# Patient Record
Sex: Female | Born: 1935 | Race: White | Hispanic: No | Smoking: Never smoker
Health system: Southern US, Community
[De-identification: ages and names within clinical notes are randomized; demographics above are authoritative.]

## PROBLEM LIST (undated history)

## (undated) DIAGNOSIS — E119 Type 2 diabetes mellitus without complications: Secondary | ICD-10-CM

## (undated) DIAGNOSIS — M1990 Unspecified osteoarthritis, unspecified site: Secondary | ICD-10-CM

## (undated) DIAGNOSIS — I10 Essential (primary) hypertension: Secondary | ICD-10-CM

## (undated) DIAGNOSIS — R413 Other amnesia: Secondary | ICD-10-CM

## (undated) DIAGNOSIS — E785 Hyperlipidemia, unspecified: Secondary | ICD-10-CM

## (undated) DIAGNOSIS — K219 Gastro-esophageal reflux disease without esophagitis: Secondary | ICD-10-CM

## (undated) HISTORY — DX: Hyperlipidemia, unspecified: E78.5

## (undated) HISTORY — DX: Gastro-esophageal reflux disease without esophagitis: K21.9

## (undated) HISTORY — DX: Other amnesia: R41.3

## (undated) HISTORY — PX: COLONOSCOPY: SHX174

## (undated) HISTORY — DX: Essential (primary) hypertension: I10

## (undated) HISTORY — PX: CHOLECYSTECTOMY: SHX55

## (undated) HISTORY — DX: Unspecified osteoarthritis, unspecified site: M19.90

## (undated) HISTORY — DX: Type 2 diabetes mellitus without complications: E11.9

---

## 1991-05-16 HISTORY — PX: ABDOMINAL HYSTERECTOMY: SHX81

## 1999-01-25 ENCOUNTER — Other Ambulatory Visit: Admission: RE | Admit: 1999-01-25 | Discharge: 1999-01-25 | Payer: Self-pay | Admitting: Internal Medicine

## 2002-05-15 HISTORY — PX: CARDIOVASCULAR STRESS TEST: SHX262

## 2003-02-24 ENCOUNTER — Encounter: Payer: Self-pay | Admitting: Internal Medicine

## 2003-03-05 ENCOUNTER — Encounter: Admission: RE | Admit: 2003-03-05 | Discharge: 2003-03-05 | Payer: Self-pay | Admitting: Internal Medicine

## 2003-03-05 ENCOUNTER — Encounter: Payer: Self-pay | Admitting: Internal Medicine

## 2003-03-06 ENCOUNTER — Encounter: Payer: Self-pay | Admitting: Internal Medicine

## 2005-02-01 ENCOUNTER — Encounter: Payer: Self-pay | Admitting: Family Medicine

## 2005-02-01 ENCOUNTER — Ambulatory Visit (HOSPITAL_COMMUNITY): Admission: RE | Admit: 2005-02-01 | Discharge: 2005-02-01 | Payer: Self-pay | Admitting: Gastroenterology

## 2005-02-01 LAB — HM COLONOSCOPY

## 2005-02-10 ENCOUNTER — Emergency Department (HOSPITAL_COMMUNITY): Admission: EM | Admit: 2005-02-10 | Discharge: 2005-02-10 | Payer: Self-pay | Admitting: *Deleted

## 2005-05-15 LAB — HM DEXA SCAN: HM Dexa Scan: NEGATIVE

## 2006-04-13 ENCOUNTER — Ambulatory Visit: Payer: Self-pay | Admitting: Family Medicine

## 2006-05-18 ENCOUNTER — Ambulatory Visit: Payer: Self-pay | Admitting: Family Medicine

## 2006-06-08 ENCOUNTER — Ambulatory Visit: Payer: Self-pay | Admitting: Family Medicine

## 2006-10-17 ENCOUNTER — Encounter (INDEPENDENT_AMBULATORY_CARE_PROVIDER_SITE_OTHER): Payer: Self-pay | Admitting: *Deleted

## 2007-02-01 ENCOUNTER — Ambulatory Visit: Payer: Self-pay | Admitting: Family Medicine

## 2007-02-01 DIAGNOSIS — L820 Inflamed seborrheic keratosis: Secondary | ICD-10-CM | POA: Insufficient documentation

## 2007-02-01 DIAGNOSIS — L570 Actinic keratosis: Secondary | ICD-10-CM | POA: Insufficient documentation

## 2007-02-01 DIAGNOSIS — L821 Other seborrheic keratosis: Secondary | ICD-10-CM | POA: Insufficient documentation

## 2007-02-04 ENCOUNTER — Encounter: Payer: Self-pay | Admitting: Family Medicine

## 2007-02-12 ENCOUNTER — Encounter: Payer: Self-pay | Admitting: Family Medicine

## 2007-04-01 ENCOUNTER — Telehealth: Payer: Self-pay | Admitting: Family Medicine

## 2007-04-19 ENCOUNTER — Ambulatory Visit: Payer: Self-pay | Admitting: Family Medicine

## 2007-04-19 DIAGNOSIS — E1159 Type 2 diabetes mellitus with other circulatory complications: Secondary | ICD-10-CM | POA: Insufficient documentation

## 2007-04-19 DIAGNOSIS — E7800 Pure hypercholesterolemia, unspecified: Secondary | ICD-10-CM | POA: Insufficient documentation

## 2007-04-19 DIAGNOSIS — E1169 Type 2 diabetes mellitus with other specified complication: Secondary | ICD-10-CM | POA: Insufficient documentation

## 2007-04-19 DIAGNOSIS — I10 Essential (primary) hypertension: Secondary | ICD-10-CM | POA: Insufficient documentation

## 2007-04-19 DIAGNOSIS — D485 Neoplasm of uncertain behavior of skin: Secondary | ICD-10-CM | POA: Insufficient documentation

## 2007-04-26 ENCOUNTER — Ambulatory Visit: Payer: Self-pay | Admitting: Family Medicine

## 2007-04-29 ENCOUNTER — Encounter: Payer: Self-pay | Admitting: Family Medicine

## 2007-04-30 ENCOUNTER — Ambulatory Visit: Payer: Self-pay | Admitting: Family Medicine

## 2007-05-02 ENCOUNTER — Encounter: Payer: Self-pay | Admitting: Family Medicine

## 2007-05-03 ENCOUNTER — Ambulatory Visit: Payer: Self-pay | Admitting: Family Medicine

## 2007-05-06 ENCOUNTER — Ambulatory Visit: Payer: Self-pay | Admitting: Family Medicine

## 2007-05-14 ENCOUNTER — Encounter (INDEPENDENT_AMBULATORY_CARE_PROVIDER_SITE_OTHER): Payer: Self-pay | Admitting: *Deleted

## 2007-05-22 ENCOUNTER — Ambulatory Visit: Payer: Self-pay | Admitting: Family Medicine

## 2007-05-29 LAB — CONVERTED CEMR LAB
ALT: 35 units/L (ref 0–35)
AST: 30 units/L (ref 0–37)
Albumin: 4.1 g/dL (ref 3.5–5.2)
Alkaline Phosphatase: 68 units/L (ref 39–117)
BUN: 20 mg/dL (ref 6–23)
Bilirubin, Direct: 0.1 mg/dL (ref 0.0–0.3)
CO2: 35 meq/L — ABNORMAL HIGH (ref 19–32)
Calcium: 9.8 mg/dL (ref 8.4–10.5)
Chloride: 97 meq/L (ref 96–112)
Cholesterol: 228 mg/dL (ref 0–200)
Creatinine, Ser: 0.8 mg/dL (ref 0.4–1.2)
Direct LDL: 137.5 mg/dL
GFR calc Af Amer: 91 mL/min
GFR calc non Af Amer: 76 mL/min
Glucose, Bld: 107 mg/dL — ABNORMAL HIGH (ref 70–99)
HDL: 76.2 mg/dL (ref >39.0)
Potassium: 3.7 meq/L (ref 3.5–5.1)
Sodium: 140 meq/L (ref 135–145)
Total Bilirubin: 0.9 mg/dL (ref 0.3–1.2)
Total CHOL/HDL Ratio: 3
Total Protein: 7.2 g/dL (ref 6.0–8.3)
Triglycerides: 61 mg/dL (ref 0–149)
VLDL: 12 mg/dL (ref 0–40)

## 2007-05-30 ENCOUNTER — Ambulatory Visit: Payer: Self-pay | Admitting: Family Medicine

## 2007-05-31 ENCOUNTER — Encounter: Payer: Self-pay | Admitting: Family Medicine

## 2007-05-31 DIAGNOSIS — R32 Unspecified urinary incontinence: Secondary | ICD-10-CM | POA: Insufficient documentation

## 2007-05-31 DIAGNOSIS — M1990 Unspecified osteoarthritis, unspecified site: Secondary | ICD-10-CM | POA: Insufficient documentation

## 2007-05-31 DIAGNOSIS — J3089 Other allergic rhinitis: Secondary | ICD-10-CM | POA: Insufficient documentation

## 2007-05-31 DIAGNOSIS — E669 Obesity, unspecified: Secondary | ICD-10-CM | POA: Insufficient documentation

## 2007-05-31 DIAGNOSIS — J309 Allergic rhinitis, unspecified: Secondary | ICD-10-CM

## 2007-05-31 DIAGNOSIS — K219 Gastro-esophageal reflux disease without esophagitis: Secondary | ICD-10-CM | POA: Insufficient documentation

## 2007-09-11 ENCOUNTER — Ambulatory Visit: Payer: Self-pay | Admitting: Family Medicine

## 2007-09-11 DIAGNOSIS — K5904 Chronic idiopathic constipation: Secondary | ICD-10-CM | POA: Insufficient documentation

## 2007-09-13 LAB — CONVERTED CEMR LAB
ALT: 23 units/L (ref 0–35)
AST: 26 units/L (ref 0–37)
Albumin: 4.3 g/dL (ref 3.5–5.2)
Alkaline Phosphatase: 56 units/L (ref 39–117)
BUN: 16 mg/dL (ref 6–23)
Basophils Absolute: 0 10*3/uL (ref 0.0–0.1)
Basophils Relative: 0.1 % (ref 0.0–1.0)
Bilirubin, Direct: 0.1 mg/dL (ref 0.0–0.3)
CO2: 36 meq/L — ABNORMAL HIGH (ref 19–32)
Calcium: 10.2 mg/dL (ref 8.4–10.5)
Chloride: 97 meq/L (ref 96–112)
Creatinine, Ser: 0.9 mg/dL (ref 0.4–1.2)
Eosinophils Absolute: 0.1 10*3/uL (ref 0.0–0.7)
Eosinophils Relative: 2.6 % (ref 0.0–5.0)
GFR calc Af Amer: 80 mL/min
GFR calc non Af Amer: 66 mL/min
Glucose, Bld: 98 mg/dL (ref 70–99)
HCT: 42.5 % (ref 36.0–46.0)
Hemoglobin: 14.2 g/dL (ref 12.0–15.0)
Lipase: 23 units/L (ref 11.0–59.0)
Lymphocytes Relative: 35.7 % (ref 12.0–46.0)
MCHC: 33.4 g/dL (ref 30.0–36.0)
MCV: 95.3 fL (ref 78.0–100.0)
Monocytes Absolute: 0.6 10*3/uL (ref 0.1–1.0)
Monocytes Relative: 10.3 % (ref 3.0–12.0)
Neutro Abs: 2.9 10*3/uL (ref 1.4–7.7)
Neutrophils Relative %: 51.3 % (ref 43.0–77.0)
Platelets: 267 10*3/uL (ref 150–400)
Potassium: 3.7 meq/L (ref 3.5–5.1)
RBC: 4.46 M/uL (ref 3.87–5.11)
RDW: 12.6 % (ref 11.5–14.6)
Sodium: 140 meq/L (ref 135–145)
TSH: 2.72 microintl units/mL (ref 0.35–5.50)
Total Bilirubin: 0.8 mg/dL (ref 0.3–1.2)
Total Protein: 7.2 g/dL (ref 6.0–8.3)
WBC: 5.6 10*3/uL (ref 4.5–10.5)

## 2007-09-30 ENCOUNTER — Ambulatory Visit: Payer: Self-pay | Admitting: Family Medicine

## 2007-09-30 LAB — CONVERTED CEMR LAB
Bilirubin Urine: NEGATIVE
Glucose, Urine, Semiquant: NEGATIVE
Ketones, urine, test strip: NEGATIVE
Nitrite: POSITIVE
Protein, U semiquant: NEGATIVE
Specific Gravity, Urine: <1.005
Urobilinogen, UA: NEGATIVE
pH: 7.5

## 2007-10-01 ENCOUNTER — Encounter (INDEPENDENT_AMBULATORY_CARE_PROVIDER_SITE_OTHER): Payer: Self-pay | Admitting: Internal Medicine

## 2007-11-06 ENCOUNTER — Encounter: Payer: Self-pay | Admitting: Family Medicine

## 2007-11-12 ENCOUNTER — Encounter (INDEPENDENT_AMBULATORY_CARE_PROVIDER_SITE_OTHER): Payer: Self-pay | Admitting: *Deleted

## 2008-03-12 ENCOUNTER — Ambulatory Visit: Payer: Self-pay | Admitting: Family Medicine

## 2008-06-23 ENCOUNTER — Telehealth: Payer: Self-pay | Admitting: Family Medicine

## 2008-07-29 ENCOUNTER — Ambulatory Visit: Payer: Self-pay | Admitting: Family Medicine

## 2008-08-02 LAB — CONVERTED CEMR LAB
ALT: 18 units/L (ref 0–35)
AST: 18 units/L (ref 0–37)
Albumin: 4.1 g/dL (ref 3.5–5.2)
Alkaline Phosphatase: 61 units/L (ref 39–117)
BUN: 14 mg/dL (ref 6–23)
Bilirubin, Direct: 0 mg/dL (ref 0.0–0.3)
CO2: 36 meq/L — ABNORMAL HIGH (ref 19–32)
Calcium: 9.5 mg/dL (ref 8.4–10.5)
Chloride: 98 meq/L (ref 96–112)
Cholesterol: 171 mg/dL (ref 0–200)
Creatinine, Ser: 0.7 mg/dL (ref 0.4–1.2)
GFR calc non Af Amer: 87.77 mL/min (ref >60)
Glucose, Bld: 117 mg/dL — ABNORMAL HIGH (ref 70–99)
HDL: 53.6 mg/dL (ref >39.00)
LDL Cholesterol: 100 mg/dL — ABNORMAL HIGH (ref 0–99)
Potassium: 3.1 meq/L — ABNORMAL LOW (ref 3.5–5.1)
Sodium: 141 meq/L (ref 135–145)
Total Bilirubin: 0.9 mg/dL (ref 0.3–1.2)
Total CHOL/HDL Ratio: 3
Total Protein: 7.2 g/dL (ref 6.0–8.3)
Triglycerides: 85 mg/dL (ref 0.0–149.0)
VLDL: 17 mg/dL (ref 0.0–40.0)

## 2008-08-04 ENCOUNTER — Ambulatory Visit: Payer: Self-pay | Admitting: Family Medicine

## 2008-11-04 ENCOUNTER — Ambulatory Visit: Payer: Self-pay | Admitting: Family Medicine

## 2008-11-06 ENCOUNTER — Encounter: Payer: Self-pay | Admitting: Family Medicine

## 2008-11-09 DIAGNOSIS — R928 Other abnormal and inconclusive findings on diagnostic imaging of breast: Secondary | ICD-10-CM | POA: Insufficient documentation

## 2008-11-10 ENCOUNTER — Encounter: Payer: Self-pay | Admitting: Family Medicine

## 2008-11-11 DIAGNOSIS — E1159 Type 2 diabetes mellitus with other circulatory complications: Secondary | ICD-10-CM | POA: Insufficient documentation

## 2008-11-11 DIAGNOSIS — E119 Type 2 diabetes mellitus without complications: Secondary | ICD-10-CM | POA: Insufficient documentation

## 2008-11-11 LAB — CONVERTED CEMR LAB
BUN: 19 mg/dL (ref 6–23)
CO2: 33 meq/L — ABNORMAL HIGH (ref 19–32)
Calcium: 9.9 mg/dL (ref 8.4–10.5)
Chloride: 99 meq/L (ref 96–112)
Creatinine, Ser: 0.9 mg/dL (ref 0.4–1.2)
GFR calc non Af Amer: 65.63 mL/min (ref >60)
Glucose, Bld: 113 mg/dL — ABNORMAL HIGH (ref 70–99)
Hgb A1c MFr Bld: 6.3 % (ref 4.6–6.5)
Potassium: 3.4 meq/L — ABNORMAL LOW (ref 3.5–5.1)
Sodium: 143 meq/L (ref 135–145)
TSH: 5.25 microintl units/mL (ref 0.35–5.50)

## 2008-11-26 ENCOUNTER — Ambulatory Visit: Payer: Self-pay | Admitting: Family Medicine

## 2008-12-08 ENCOUNTER — Encounter: Payer: Self-pay | Admitting: Family Medicine

## 2009-01-25 ENCOUNTER — Ambulatory Visit: Payer: Self-pay | Admitting: Family Medicine

## 2009-01-26 LAB — CONVERTED CEMR LAB
ALT: 19 units/L (ref 0–35)
AST: 26 units/L (ref 0–37)
Albumin: 4.4 g/dL (ref 3.5–5.2)
Alkaline Phosphatase: 55 units/L (ref 39–117)
BUN: 14 mg/dL (ref 6–23)
Bilirubin, Direct: 0.1 mg/dL (ref 0.0–0.3)
CO2: 35 meq/L — ABNORMAL HIGH (ref 19–32)
Calcium: 9.7 mg/dL (ref 8.4–10.5)
Chloride: 101 meq/L (ref 96–112)
Cholesterol: 185 mg/dL (ref 0–200)
Creatinine, Ser: 0.8 mg/dL (ref 0.4–1.2)
Creatinine,U: 162.7 mg/dL
GFR calc non Af Amer: 75.13 mL/min (ref >60)
Glucose, Bld: 108 mg/dL — ABNORMAL HIGH (ref 70–99)
HDL: 56.2 mg/dL (ref >39.00)
Hgb A1c MFr Bld: 6.3 % (ref 4.6–6.5)
LDL Cholesterol: 103 mg/dL — ABNORMAL HIGH (ref 0–99)
Microalb Creat Ratio: 8 mg/g (ref 0.0–30.0)
Microalb, Ur: 1.3 mg/dL (ref 0.0–1.9)
Potassium: 3.5 meq/L (ref 3.5–5.1)
Sodium: 143 meq/L (ref 135–145)
Total Bilirubin: 0.8 mg/dL (ref 0.3–1.2)
Total CHOL/HDL Ratio: 3
Total Protein: 7 g/dL (ref 6.0–8.3)
Triglycerides: 130 mg/dL (ref 0.0–149.0)
VLDL: 26 mg/dL (ref 0.0–40.0)

## 2009-01-28 ENCOUNTER — Ambulatory Visit: Payer: Self-pay | Admitting: Family Medicine

## 2009-01-28 LAB — CONVERTED CEMR LAB
Cholesterol, target level: 200 mg/dL
HDL goal, serum: 40 mg/dL
LDL Goal: 100 mg/dL

## 2009-02-03 ENCOUNTER — Telehealth: Payer: Self-pay | Admitting: Family Medicine

## 2009-02-05 ENCOUNTER — Encounter: Payer: Self-pay | Admitting: Family Medicine

## 2009-02-09 ENCOUNTER — Ambulatory Visit: Payer: Self-pay | Admitting: Family Medicine

## 2009-04-26 ENCOUNTER — Ambulatory Visit: Payer: Self-pay | Admitting: Family Medicine

## 2009-04-29 LAB — CONVERTED CEMR LAB
ALT: 21 units/L (ref 0–35)
AST: 22 units/L (ref 0–37)
Albumin: 4.4 g/dL (ref 3.5–5.2)
Alkaline Phosphatase: 60 units/L (ref 39–117)
BUN: 7 mg/dL (ref 6–23)
Bilirubin, Direct: 0.1 mg/dL (ref 0.0–0.3)
CO2: 35 meq/L — ABNORMAL HIGH (ref 19–32)
Calcium: 10.1 mg/dL (ref 8.4–10.5)
Chloride: 98 meq/L (ref 96–112)
Cholesterol: 210 mg/dL — ABNORMAL HIGH (ref 0–200)
Creatinine, Ser: 0.7 mg/dL (ref 0.4–1.2)
Direct LDL: 119.8 mg/dL
GFR calc non Af Amer: 87.59 mL/min (ref >60)
Glucose, Bld: 111 mg/dL — ABNORMAL HIGH (ref 70–99)
HDL: 69.6 mg/dL (ref >39.00)
Hgb A1c MFr Bld: 6.2 % (ref 4.6–6.5)
Potassium: 3.6 meq/L (ref 3.5–5.1)
Sodium: 142 meq/L (ref 135–145)
Total Bilirubin: 1.1 mg/dL (ref 0.3–1.2)
Total CHOL/HDL Ratio: 3
Total Protein: 7.3 g/dL (ref 6.0–8.3)
Triglycerides: 87 mg/dL (ref 0.0–149.0)
VLDL: 17.4 mg/dL (ref 0.0–40.0)

## 2009-05-10 ENCOUNTER — Encounter: Payer: Self-pay | Admitting: Family Medicine

## 2009-05-11 ENCOUNTER — Ambulatory Visit: Payer: Self-pay | Admitting: Family Medicine

## 2009-05-15 LAB — HM DIABETES EYE EXAM

## 2009-05-20 ENCOUNTER — Encounter: Payer: Self-pay | Admitting: Family Medicine

## 2009-05-24 ENCOUNTER — Encounter: Payer: Self-pay | Admitting: Family Medicine

## 2009-05-27 ENCOUNTER — Telehealth: Payer: Self-pay | Admitting: Family Medicine

## 2009-05-27 ENCOUNTER — Encounter: Payer: Self-pay | Admitting: Internal Medicine

## 2009-05-27 ENCOUNTER — Ambulatory Visit: Payer: Self-pay | Admitting: Family Medicine

## 2009-05-28 ENCOUNTER — Ambulatory Visit: Payer: Self-pay | Admitting: Internal Medicine

## 2009-05-28 ENCOUNTER — Encounter: Payer: Self-pay | Admitting: Internal Medicine

## 2009-06-04 LAB — CONVERTED CEMR LAB
BUN: 16 mg/dL (ref 6–23)
CO2: 30 meq/L (ref 19–32)
Calcium: 9.9 mg/dL (ref 8.4–10.5)
Chloride: 98 meq/L (ref 96–112)
Creatinine, Ser: 0.65 mg/dL (ref 0.40–1.20)
Glucose, Bld: 101 mg/dL — ABNORMAL HIGH (ref 70–99)
Potassium: 3.5 meq/L (ref 3.5–5.3)
Sodium: 140 meq/L (ref 135–145)
TSH: 2.56 microintl units/mL (ref 0.350–4.500)

## 2009-06-21 ENCOUNTER — Encounter: Payer: Self-pay | Admitting: Cardiovascular Disease

## 2009-06-28 ENCOUNTER — Telehealth: Payer: Self-pay | Admitting: Cardiovascular Disease

## 2009-06-29 ENCOUNTER — Ambulatory Visit: Payer: Self-pay | Admitting: Cardiovascular Disease

## 2009-07-09 ENCOUNTER — Ambulatory Visit: Payer: Self-pay | Admitting: Cardiovascular Disease

## 2009-07-13 ENCOUNTER — Ambulatory Visit: Payer: Self-pay | Admitting: Cardiovascular Disease

## 2009-07-26 ENCOUNTER — Encounter: Payer: Self-pay | Admitting: Cardiovascular Disease

## 2009-08-13 ENCOUNTER — Telehealth: Payer: Self-pay | Admitting: Cardiovascular Disease

## 2009-08-27 ENCOUNTER — Ambulatory Visit: Payer: Self-pay | Admitting: Family Medicine

## 2009-09-02 LAB — CONVERTED CEMR LAB
ALT: 21 units/L (ref 0–35)
AST: 20 units/L (ref 0–37)
Cholesterol: 167 mg/dL (ref 0–200)
HDL: 65.8 mg/dL (ref >39.00)
LDL Cholesterol: 89 mg/dL (ref 0–99)
Total CHOL/HDL Ratio: 3
Triglycerides: 61 mg/dL (ref 0.0–149.0)
VLDL: 12.2 mg/dL (ref 0.0–40.0)

## 2009-10-12 ENCOUNTER — Encounter: Payer: Self-pay | Admitting: Family Medicine

## 2009-10-26 ENCOUNTER — Encounter: Payer: Self-pay | Admitting: Family Medicine

## 2009-11-11 ENCOUNTER — Ambulatory Visit: Payer: Self-pay | Admitting: Family Medicine

## 2009-11-12 ENCOUNTER — Ambulatory Visit: Payer: Self-pay | Admitting: Family Medicine

## 2009-11-12 LAB — CONVERTED CEMR LAB
ALT: 19 units/L (ref 0–35)
AST: 21 units/L (ref 0–37)
Albumin: 4.5 g/dL (ref 3.5–5.2)
Alkaline Phosphatase: 73 units/L (ref 39–117)
BUN: 19 mg/dL (ref 6–23)
Bilirubin, Direct: 0.1 mg/dL (ref 0.0–0.3)
CO2: 33 meq/L — ABNORMAL HIGH (ref 19–32)
Calcium: 9.6 mg/dL (ref 8.4–10.5)
Chloride: 99 meq/L (ref 96–112)
Cholesterol: 173 mg/dL (ref 0–200)
Creatinine, Ser: 0.8 mg/dL (ref 0.4–1.2)
GFR calc non Af Amer: 80.76 mL/min (ref >60)
Glucose, Bld: 98 mg/dL (ref 70–99)
HDL: 65.5 mg/dL (ref >39.00)
Hgb A1c MFr Bld: 6.4 % (ref 4.6–6.5)
LDL Cholesterol: 93 mg/dL (ref 0–99)
Potassium: 4 meq/L (ref 3.5–5.1)
Sodium: 141 meq/L (ref 135–145)
Total Bilirubin: 0.5 mg/dL (ref 0.3–1.2)
Total CHOL/HDL Ratio: 3
Total Protein: 7 g/dL (ref 6.0–8.3)
Triglycerides: 74 mg/dL (ref 0.0–149.0)
VLDL: 14.8 mg/dL (ref 0.0–40.0)

## 2009-11-17 ENCOUNTER — Encounter: Payer: Self-pay | Admitting: Family Medicine

## 2010-01-13 ENCOUNTER — Encounter: Payer: Self-pay | Admitting: Family Medicine

## 2010-01-14 ENCOUNTER — Ambulatory Visit: Payer: Self-pay | Admitting: Family Medicine

## 2010-01-14 DIAGNOSIS — G4700 Insomnia, unspecified: Secondary | ICD-10-CM | POA: Insufficient documentation

## 2010-02-26 ENCOUNTER — Observation Stay (HOSPITAL_COMMUNITY): Admission: EM | Admit: 2010-02-26 | Discharge: 2010-02-27 | Payer: Self-pay | Admitting: Emergency Medicine

## 2010-03-01 ENCOUNTER — Telehealth: Payer: Self-pay | Admitting: Cardiovascular Disease

## 2010-03-10 ENCOUNTER — Ambulatory Visit: Payer: Self-pay | Admitting: Family Medicine

## 2010-03-10 LAB — CONVERTED CEMR LAB: Whiff Test: NEGATIVE

## 2010-03-11 ENCOUNTER — Telehealth: Payer: Self-pay | Admitting: Family Medicine

## 2010-05-18 ENCOUNTER — Ambulatory Visit
Admission: RE | Admit: 2010-05-18 | Discharge: 2010-05-18 | Payer: Self-pay | Source: Home / Self Care | Attending: Family Medicine | Admitting: Family Medicine

## 2010-05-18 ENCOUNTER — Other Ambulatory Visit: Payer: Self-pay | Admitting: Family Medicine

## 2010-05-18 ENCOUNTER — Encounter: Payer: Self-pay | Admitting: Family Medicine

## 2010-05-18 LAB — HEPATIC FUNCTION PANEL
ALT: 26 U/L (ref 0–35)
AST: 25 U/L (ref 0–37)
Albumin: 4.2 g/dL (ref 3.5–5.2)
Alkaline Phosphatase: 77 U/L (ref 39–117)
Bilirubin, Direct: 0.1 mg/dL (ref 0.0–0.3)
Total Bilirubin: 0.9 mg/dL (ref 0.3–1.2)
Total Protein: 7 g/dL (ref 6.0–8.3)

## 2010-05-18 LAB — BASIC METABOLIC PANEL
BUN: 17 mg/dL (ref 6–23)
CO2: 31 mEq/L (ref 19–32)
Calcium: 9.7 mg/dL (ref 8.4–10.5)
Chloride: 102 mEq/L (ref 96–112)
Creatinine, Ser: 0.6 mg/dL (ref 0.4–1.2)
GFR: 96.84 mL/min (ref >60.00)
Glucose, Bld: 92 mg/dL (ref 70–99)
Potassium: 4.5 mEq/L (ref 3.5–5.1)
Sodium: 141 mEq/L (ref 135–145)

## 2010-05-18 LAB — LIPID PANEL
Cholesterol: 197 mg/dL (ref 0–200)
HDL: 69.7 mg/dL (ref >39.00)
LDL Cholesterol: 117 mg/dL — ABNORMAL HIGH (ref 0–99)
Total CHOL/HDL Ratio: 3
Triglycerides: 53 mg/dL (ref 0.0–149.0)
VLDL: 10.6 mg/dL (ref 0.0–40.0)

## 2010-05-18 LAB — HM MAMMOGRAPHY

## 2010-05-18 LAB — HEMOGLOBIN A1C: Hgb A1c MFr Bld: 6.4 % (ref 4.6–6.5)

## 2010-05-18 LAB — MICROALBUMIN / CREATININE URINE RATIO
Creatinine,U: 117.6 mg/dL
Microalb Creat Ratio: 0.5 mg/g (ref 0.0–30.0)
Microalb, Ur: 0.6 mg/dL (ref 0.0–1.9)

## 2010-05-20 ENCOUNTER — Encounter: Payer: Self-pay | Admitting: Family Medicine

## 2010-05-20 ENCOUNTER — Ambulatory Visit
Admission: RE | Admit: 2010-05-20 | Discharge: 2010-05-20 | Payer: Self-pay | Source: Home / Self Care | Attending: Family Medicine | Admitting: Family Medicine

## 2010-05-20 LAB — HM DIABETES FOOT EXAM

## 2010-06-09 ENCOUNTER — Ambulatory Visit
Admission: RE | Admit: 2010-06-09 | Discharge: 2010-06-09 | Payer: Self-pay | Source: Home / Self Care | Attending: Family Medicine | Admitting: Family Medicine

## 2010-06-14 NOTE — Assessment & Plan Note (Signed)
Summary: NEW PT; PALPITATIONS   Visit Type:  New Patient Referring Provider:  Kerin Perna, MD Primary Provider:  Kerby Nora MD  CC:  heart racing and feel like it is beating really hard, don't happen all the time, and it will sometimes last 30 sec to a minute. With in the last week has occured more often. No more than usually sob. No edema. .  History of Present Illness: Patient is a 75 year old with a history of hypertension, dyslipidemia who was referred for evaluation of palpitations. the patient has had palpitations in the past.  She said that recently they have been coming on more frequently.  She will feel her heart start pounding suddenly.  Initially it happened when she was in bed.  Now it occcurs at other times.  Yesterday seemed to happen throughout the day.  Episodes initially lasted 30 sec to a min.  Now are longer.  Denies dizziness, shortness of breath, chest pains.  Is not that active but can do her housework at a usual pace. This Am just felt jittery.  Does not drink much caffeine.  BP checks at home 120-140/60-70 and Pulse by machine is 60 to 70.  Cannot take on her own.  Preventive Screening-Counseling & Management  Alcohol-Tobacco     Alcohol drinks/day: <1     Smoking Status: never  Caffeine-Diet-Exercise     Caffeine use/day: 1-2  cup a day     Does Patient Exercise: no   Current Problems (verified): 1)  Palpitations  (ICD-785.1) 2)  Hypokalemia  (ICD-276.8) 3)  Mammogram, Abnormal, Left  (ICD-793.80) 4)  Other Malaise and Fatigue  (ICD-780.79) 5)  Uti  (ICD-599.0) 6)  Constipation  (ICD-564.00) 7)  Nausea  (ICD-787.02) 8)  Obesity  (ICD-278.00) 9)  Osteoarthritis  (ICD-715.90) 10)  Gerd  (ICD-530.81) 11)  Allergic Rhinitis  (ICD-477.9) 12)  Diabetes Mellitus, Type II  (ICD-250.00) 13)  Cellulitis, Leg, Right  (ICD-682.6) 14)  Special Screening For Osteoporosis  (ICD-V82.81) 15)  Neoplasm, Skin, Uncertain Behavior  (ICD-238.2) 16)  Urinary  Incontinence  (ICD-788.30) 17)  Hypertension  (ICD-401.9) 18)  Hyperlipidemia  (ICD-272.4) 19)  Verruca Vulgaris  (ICD-078.10) 20)  Actinic Keratosis  (ICD-702.0) 21)  Seborrheic Keratosis  (ICD-702.19)  Current Medications (verified): 1)  Amlodipine Besylate 5 Mg Tabs (Amlodipine Besylate) .... Take 1 Tablet By Mouth Once A Day 2)  Simvastatin 40 Mg Tabs (Simvastatin) .... Take 1 Tablet By Mouth Once A Day -On Hold Due To Heart Racing 3)  Omeprazole 20 Mg Tbec (Omeprazole) .... Take 1 Tablet By Mouth Once A Day 4)  Atenolol-Chlorthalidone 50-25 Mg Tabs (Atenolol-Chlorthalidone) .Marland Kitchen.. 1 Tablet By Mouth Once A Day 5)  Otc Potassium .Marland Kitchen.. 1 By Mouth Once Daily 6)  Adult Aspirin Ec Low Strength 81 Mg  Tbec (Aspirin) .... Take 1 Tablet By Mouth Once A Day 7)  Fish Oil Concentrate 1000 Mg  Caps (Omega-3 Fatty Acids) .... Take 1 Tablet By Mouth Once A Day 8)  Bl Multiple Vitamins   Tabs (Multiple Vitamins-Minerals) .... Take 1 Tablet By Mouth Once A Day 9)  Calcium 600/vitamin D 600-200 Mg-Unit  Tabs (Calcium Carbonate-Vitamin D) .... Take 1 Tablet By Mouth Two Times A Day 10)  Bl Magnesium 250 Mg  Tabs (Magnesium) .... Take 1 Tablet By Mouth Once A Day 11)  Glucosamine-Chondroitin 250-200 Mg  Caps (Glucosamine-Chondroitin) .... Take 1 Tablet By Mouth Once A Day 12)  Co Q-10 120 Mg  Caps (Coenzyme Q10) .... Take  1 Capsule By Mouth Once A Day 13)  Glucometer of Choice .... Check Blood Sugar Daily  Dx 250.00 14)  True Track Glucometer Test Strips .... Check Blood Sugar Once A Day  Allergies (verified): 1)  ! Codeine  Past History:  Past Medical History: Last updated: June 17, 2007 Hyperlipidemia Hypertension Urinary incontinence Allergic rhinitis GERD Osteoarthritis  Family History: Last updated: 06/17/07 Father: Died 88 MI Mother: Died 55, dementia, CVA Siblings: 1 sister, ovarian CA, age 17 DM:  Maternal side Ovarian CA:  Sister, grandmother, great aunt  Social History: Last  updated: June 17, 2007 Never Smoked Alcohol use-no Drug use-no Regular exercise-yes, Curves 2x/week Marital Status: Married x 48 years Children: None Occupation: Scientist, product/process development, works part-time as Asst. to [employer] Diet:  Fruit and veggies, no FF, some water, artificial sweetener  Past Surgical History: Echo Stress Test ([facility]:  Cardiolite in 2004.  Normal) DEXA (-) 2007 Cholecystectomy 1990's TA Hysterectomy 1993  Social History: Alcohol drinks/day:  <1 Caffeine use/day:  1-2  cup a day Does Patient Exercise:  no   Review of Systems       All systems reviewed.  Negative the the above problem except as noted above.  Vital Signs:  Patient profile:   74 year old female Height:      62.75 inches Weight:      168 pounds BMI:     30.11 Pulse rate:   60 / minute Pulse rhythm:   regular BP sitting:   112 / 64  (left arm) Cuff size:   regular  Vitals Entered By: Mercer Pod (May 28, 2009 10:47 AM)  Physical Exam  Additional Exam:  HEENT:  Normocephalic, atraumatic. EOMI, PERRLA.  Neck: JVP is normal. No thyromegaly. No bruits.  Lungs: clear to auscultation. No rales no wheezes.  Heart: Regular rate and rhythm. Normal S1, S2. No S3.   No significant murmurs. PMI not displaced.  Abdomen:  Supple, nontender. Normal bowel sounds. No masses. No hepatomegaly.  Extremities:   Good distal pulses throughout. No lower extremity edema.  Musculoskeletal :moving all extremities.  Neuro:   alert and oriented x3.    EKG  Procedure date:  05/28/2009  Findings:      NSR.  60 bpm.  Impression & Recommendations:  Problem # 1:  PALPITATIONS (ICD-785.1) I am not sure what these represent.  The good thing is that they are not hemodynamically destabilizing. I would recommend scheduling her for an event monitor.  I would also check and BMET and TSH today.   Continue current meds.  Activities as usual.  Problem # 2:  HYPERTENSION (ICD-401.9) Continue current  meds. Her updated medication list for this problem includes:    Amlodipine Besylate 5 Mg Tabs (Amlodipine besylate) .Marland Kitchen... Take 1 tablet by mouth once a day    Atenolol-chlorthalidone 50-25 Mg Tabs (Atenolol-chlorthalidone) .Marland Kitchen... 1 tablet by mouth once a day    Adult Aspirin Ec Low Strength 81 Mg Tbec (Aspirin) .Marland Kitchen... Take 1 tablet by mouth once a day  Problem # 3:  HYPERLIPIDEMIA (ICD-272.4) Patient has stopped this for now.  INitially she thought symptoms were due to an increase in dosing.  I am not convinced but would hold.  She says she had a scan of her vessels at an outside screening site.  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  Will try to get this.  May need to repeat. Her updated medication list for this problem includes:    Simvastatin 40  Mg Tabs (Simvastatin) .Marland Kitchen... Take 1 tablet by mouth once a day -on hold due to heart racing  Other Orders: T-Basic Metabolic Panel [phone]) T-TSH [phone])  Patient Instructions: 1)  Your physician recommends that you schedule a follow-up appointment after we receive the results of your event monitor. 2)  Your physician recommends that you continue on your current medications as directed. Please refer to the Current Medication list given to you today. 3)  Your physician has recommended that you wear an event monitor.  Event monitors are medical devices that record the heart's electrical activity. Doctors most often use these monitors to diagnose arrhythmias. Arrhythmias are problems with the speed or rhythm of the heartbeat. The monitor is a small, portable device. You can wear one while you do your normal daily activities. This is usually used to diagnose what is causing palpitations/syncope (passing out).
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
## 2010-06-14 NOTE — Assessment & Plan Note (Signed)
Summary: NAUSEA/CLE   Vital Signs:  Patient Profile:   75 Years Old Female Height:     62.75 inches (160.02 cm) Weight:      182 pounds Temp:     98.1 degrees F oral Pulse rate:   72 / minute Pulse rhythm:   regular BP sitting:   128 / 76  (left arm) Cuff size:   regular  Vitals Entered ByMarland Kitchen Delilah Shan (September 11, 2007 9:35 AM)                 Chief Complaint:  Nausea.  History of Present Illness: nausea x 2.5 weeks Has been using carb blocker OTC med/hydroxycut intermittant lasting 1-2 hours occ decreased energy, bloating no abdominal pain, no vomiting on prilosec daily for GERD BMs also now less frequent every 3 days, using laxative x 4-5 months no unexpected weight loss, fever,  has been also having some hot flashes no blood in stool  last colonoscopy 2006: tics mother and sister with ovarian cancer, she has had hysterectomy     Current Allergies (reviewed today): ! CODEINE  Past Medical History:    Reviewed history from 05/31/2007 and no changes required:       Hyperlipidemia       Hypertension       Urinary incontinence       Allergic rhinitis       GERD       Osteoarthritis  Past Surgical History:    Reviewed history from 05/31/2007 and no changes required:       Echo       Stress Test       DEXA (-) 2007       Cholecystectomy 1990's       TA Hysterectomy 1993   Family History:    Reviewed history from 05/31/2007 and no changes required:       Father: Died 76 MI       Mother: Died 64, dementia, CVA       Siblings: 1 sister, ovarian CA, age 92       DM:  Maternal side       Ovarian CA:  Sister, grandmother, great aunt    Review of Systems      See HPI   Physical Exam  General:     Well-developed,well-nourished,in no acute distress; alert,appropriate and cooperative throughout examination Neck:     No deformities, masses, or tenderness noted. Lungs:     Normal respiratory effort, chest expands symmetrically. Lungs are clear to  auscultation, no crackles or wheezes. Heart:     Normal rate and regular rhythm. S1 and S2 normal without gallop, murmur, click, rub or other extra sounds. Abdomen:     Bowel sounds positive,abdomen soft and non-tender without masses, organomegaly or hernias noted. Cervical Nodes:     no cervical or supraclavicular lymphadenopathy     Impression & Recommendations:  Problem # 1:  NAUSEA (ICD-787.02) may be due to GERD, mild gastritis vs constipation. Check cbs, CMET. increase prilosec to 2 tabs daily x 2-4 weeks.  Treat constiptation as below. Doubt ovarian cancer due to hysterectomy, but can consider CA125 or CT if not improving. The following medications were removed from the medication list:    Claritin 10 Mg Tabs (Loratadine) .Marland Kitchen... Take 1 tablet by mouth once a day as needed    Zyrtec Allergy 10 Mg Tabs (Cetirizine hcl) .Marland Kitchen... Take 1 tablet by mouth once a day  Orders: TLB-BMP (Basic Metabolic Panel-BMET) (80048-METABOL) TLB-CBC  Platelet - w/Differential (85025-CBCD) TLB-Hepatic/Liver Function Pnl (80076-HEPATIC) TLB-TSH (Thyroid Stimulating Hormone) (84443-TSH) TLB-Lipase (83690-LIPASE)   Problem # 2:  CONSTIPATION (ICD-564.00) Discussed dietary fiber measures and increased water intake.  MAy use Miralax as needed.  Complete Medication List: 1)  Amlodipine Besylate 5 Mg Tabs (Amlodipine besylate) .... Take 1 tablet by mouth once a day 2)  Simvastatin 20 Mg Tabs (Simvastatin) .... Take 1 tablet by mouth every night 3)  Prilosec 20 Mg Cpdr (Omeprazole) .... Take 1 capsule by mouth once a day 4)  Atenolol-chlorthalidone 50-25 Mg Tabs (Atenolol-chlorthalidone) .Marland Kitchen.. 1 tablet by mouth once a day 5)  Potassium Chloride Crys Cr 20 Meq Tbcr (Potassium chloride crys cr) 6)  Adult Aspirin Ec Low Strength 81 Mg Tbec (Aspirin) .... Take 1 tablet by mouth once a day 7)  Fish Oil Concentrate 1000 Mg Caps (Omega-3 fatty acids) .... Take 1 tablet by mouth once a day 8)  Bl Multiple Vitamins  Tabs (Multiple vitamins-minerals) .... Take 1 tablet by mouth once a day 9)  Calcium 600/vitamin D 600-200 Mg-unit Tabs (Calcium carbonate-vitamin d) .... Take 1 tablet by mouth two times a day 10)  Bl Magnesium 250 Mg Tabs (Magnesium) .... Take 1 tablet by mouth once a day 11)  Glucosamine-chondroitin 250-200 Mg Caps (Glucosamine-chondroitin) .... Take 1 tablet by mouth once a day 12)  Co Q-10 120 Mg Caps (Coenzyme q10) .... Take 1 capsule by mouth once a day   Patient Instructions: 1)  Increase prilosec to 2  x 20 mg daily. 2)  Increase fiber ands water intake. 3)  May use Miralax if needed for constipation. 4)  Please schedule a follow-up appointment in 3-4 weeks if not improving.    ] Current Allergies (reviewed today): ! CODEINE Current Medications (including changes made in today's visit):  AMLODIPINE BESYLATE 5 MG TABS (AMLODIPINE BESYLATE) Take 1 tablet by mouth once a day SIMVASTATIN 20 MG TABS (SIMVASTATIN) Take 1 tablet by mouth every night PRILOSEC 20 MG CPDR (OMEPRAZOLE) Take 1 capsule by mouth once a day ATENOLOL-CHLORTHALIDONE 50-25 MG TABS (ATENOLOL-CHLORTHALIDONE) 1 tablet by mouth once a day POTASSIUM CHLORIDE CRYS CR 20 MEQ TBCR (POTASSIUM CHLORIDE CRYS CR)  ADULT ASPIRIN EC LOW STRENGTH 81 MG  TBEC (ASPIRIN) Take 1 tablet by mouth once a day FISH OIL CONCENTRATE 1000 MG  CAPS (OMEGA-3 FATTY ACIDS) Take 1 tablet by mouth once a day BL MULTIPLE VITAMINS   TABS (MULTIPLE VITAMINS-MINERALS) Take 1 tablet by mouth once a day CALCIUM 600/VITAMIN D 600-200 MG-UNIT  TABS (CALCIUM CARBONATE-VITAMIN D) Take 1 tablet by mouth two times a day BL MAGNESIUM 250 MG  TABS (MAGNESIUM) Take 1 tablet by mouth once a day GLUCOSAMINE-CHONDROITIN 250-200 MG  CAPS (GLUCOSAMINE-CHONDROITIN) Take 1 tablet by mouth once a day CO Q-10 120 MG  CAPS (COENZYME Q10) Take 1 capsule by mouth once a day

## 2010-06-14 NOTE — Progress Notes (Signed)
Summary: pt wants omeprazole  Phone Note Refill Request Call back at [phone] Message from:  Patient  Refills Requested: Medication #1:  PRILOSEC 20 MG CPDR Take 1 capsule by mouth once a day Phoned request from pt, she wants this changed to generic and sent to [facility].  Initial call taken by: Lowella Petties CMA,  February 03, 2009 2:55 PM    New/Updated Medications: OMEPRAZOLE 20 MG TBEC (OMEPRAZOLE) Take 1 tablet by mouth once a day Prescriptions: OMEPRAZOLE 20 MG TBEC (OMEPRAZOLE) Take 1 tablet by mouth once a day  #30 x 11   Entered and Authorized by:   Kerby Nora MD   Signed by:   Kerby Nora MD on 02/03/2009   Method used:   Electronically to        [facility]* (retail)       [address] [place], [place]  [address]       Ph: [phone] or [phone]       Fax: [phone]   RxID:   [id number]

## 2010-06-14 NOTE — Assessment & Plan Note (Signed)
Summary: f/u on leg per md/dlo   Vital Signs:  Patient Profile:   75 Years Old Female Height:     62.75 inches Weight:      179.50 pounds Temp:     98.4 degrees F oral Pulse rate:   76 / minute Pulse rhythm:   regular BP sitting:   130 / 74  (left arm) Cuff size:   regular  Vitals Entered ByMarland Kitchen Delilah Shan (May 03, 2007 12:14 PM)                 Chief Complaint:  Follow up on leg.  History of Present Illness: right leg cellulitis at excision site On day 3 of Bactrim DS 2 tab by mouth two times a day Has SE of nausea and jitteryness with medication  Leg is still red, but it has not spread Has burning when applying antibiotic ointment.  Current Allergies (reviewed today): ! CODEINE  Past Medical History:    Reviewed history from 04/19/2007 and no changes required:       Hyperlipidemia       Hypertension       Urinary incontinence     Review of Systems  The patient denies fever.     Physical Exam  General:     Well-developed,well-nourished,in no acute distress; alert,appropriate and cooperative throughout examination Skin:     3 cm diameter brown/black lesion (nitrate sticks used to stop bleeding) with 6 cm surrounding erythema and warmth, small extension of redness medical to wounds, swelling    Impression & Recommendations:  Problem # 1:  CELLULITIS, LEG, RIGHT (ICD-682.6) Change antibiotic due to no improvement and SE. Follow closely. Her updated medication list for this problem includes:    Doxycycline Hyclate 100 Mg Caps (Doxycycline hyclate) .Marland Kitchen... Take 1 tablet by mouth every 12 hours x 10 days   Complete Medication List: 1)  Amlodipine Besylate 5 Mg Tabs (Amlodipine besylate) .... Take 1 tablet by mouth once a day 2)  Simvastatin 20 Mg Tabs (Simvastatin) .... Take 1 tablet by mouth every night 3)  Prilosec 20 Mg Cpdr (Omeprazole) .... Take 1 capsule by mouth once a day 4)  Atenolol-chlorthalidone 50-25 Mg Tabs (Atenolol-chlorthalidone)  .Marland Kitchen.. 1 tablet by mouth once a day 5)  Potassium Chloride Crys Cr 20 Meq Tbcr (Potassium chloride crys cr) 6)  Adult Aspirin Ec Low Strength 81 Mg Tbec (Aspirin) .... Take 1 tablet by mouth once a day 7)  Fish Oil Concentrate 1000 Mg Caps (Omega-3 fatty acids) .... Take 1 tablet by mouth once a day 8)  Bl Multiple Vitamins Tabs (Multiple vitamins-minerals) .... Take 1 tablet by mouth once a day 9)  Claritin 10 Mg Tabs (Loratadine) .... Take 1 tablet by mouth once a day as needed 10)  Zyrtec Allergy 10 Mg Tabs (Cetirizine hcl) .... Take 1 tablet by mouth once a day 11)  Calcium 600/vitamin D 600-200 Mg-unit Tabs (Calcium carbonate-vitamin d) .... Take 1 tablet by mouth two times a day 12)  Bl Magnesium 250 Mg Tabs (Magnesium) .... Take 1 tablet by mouth once a day 13)  Glucosamine-chondroitin 250-200 Mg Caps (Glucosamine-chondroitin) .... Take 1 tablet by mouth once a day 14)  Co Q-10 120 Mg Caps (Coenzyme q10) .... Take 1 capsule by mouth once a day 15)  Doxycycline Hyclate 100 Mg Caps (Doxycycline hyclate) .... Take 1 tablet by mouth every 12 hours x 10 days  Other Orders: No Charge Patient Arrived (NCPA0) (NCPA0)   Patient Instructions: 1)  Follow up Monday 12/22 cellulitis    Prescriptions: DOXYCYCLINE HYCLATE 100 MG  CAPS (DOXYCYCLINE HYCLATE) Take 1 tablet by mouth every 12 hours x 10 days  #20 x 0   Entered and Authorized by:   Kerby Nora MD   Signed by:   Kerby Nora MD on 05/03/2007   Method used:   Telephoned to ...       [facility]       [address] [place], [place]  [address]       Ph: [phone] or [phone]       Fax: [phone]   RxID:   [id number]  ] Current Allergies (reviewed today): ! CODEINE Current Medications (including changes made in today's visit):  AMLODIPINE BESYLATE 5 MG TABS (AMLODIPINE BESYLATE) Take 1 tablet by mouth once a day SIMVASTATIN 20 MG TABS (SIMVASTATIN)  Take 1 tablet by mouth every night PRILOSEC 20 MG CPDR (OMEPRAZOLE) Take 1 capsule by mouth once a day ATENOLOL-CHLORTHALIDONE 50-25 MG TABS (ATENOLOL-CHLORTHALIDONE) 1 tablet by mouth once a day POTASSIUM CHLORIDE CRYS CR 20 MEQ TBCR (POTASSIUM CHLORIDE CRYS CR)  ADULT ASPIRIN EC LOW STRENGTH 81 MG  TBEC (ASPIRIN) Take 1 tablet by mouth once a day FISH OIL CONCENTRATE 1000 MG  CAPS (OMEGA-3 FATTY ACIDS) Take 1 tablet by mouth once a day BL MULTIPLE VITAMINS   TABS (MULTIPLE VITAMINS-MINERALS) Take 1 tablet by mouth once a day CLARITIN 10 MG  TABS (LORATADINE) Take 1 tablet by mouth once a day as needed ZYRTEC ALLERGY 10 MG  TABS (CETIRIZINE HCL) Take 1 tablet by mouth once a day CALCIUM 600/VITAMIN D 600-200 MG-UNIT  TABS (CALCIUM CARBONATE-VITAMIN D) Take 1 tablet by mouth two times a day BL MAGNESIUM 250 MG  TABS (MAGNESIUM) Take 1 tablet by mouth once a day GLUCOSAMINE-CHONDROITIN 250-200 MG  CAPS (GLUCOSAMINE-CHONDROITIN) Take 1 tablet by mouth once a day CO Q-10 120 MG  CAPS (COENZYME Q10) Take 1 capsule by mouth once a day DOXYCYCLINE HYCLATE 100 MG  CAPS (DOXYCYCLINE HYCLATE) Take 1 tablet by mouth every 12 hours x 10 days

## 2010-06-14 NOTE — Assessment & Plan Note (Signed)
Summary: F2W/AMD   Visit Type:  Follow-up Referring Provider:  Kerin Perna, MD Primary Provider:  Kerby Nora MD  CC:  no complaints.  History of Present Illness:  75 year old with a history of hypertension, dyslipidemia, 6 who is following up for evaluation of her palpitations.  Ms. Demchak had a event monitor that has been completed that showed no significant arrhythmia.  She states that she continues to have tachycardia palpitations, last episode 4 days ago on Thursday. She describes it as coming on at 9 PM, and a strong beat. It lasted for several hours and she could not get any sleep. She is uncertain if it was fast, but was certainly strong. She's also started back on her simvastatin. When she has her palpitations, she is very uncomfortable.   Current Problems (verified): 1)  Palpitations  (ICD-785.1) 2)  Hypokalemia  (ICD-276.8) 3)  Mammogram, Abnormal, Left  (ICD-793.80) 4)  Other Malaise and Fatigue  (ICD-780.79) 5)  Uti  (ICD-599.0) 6)  Constipation  (ICD-564.00) 7)  Nausea  (ICD-787.02) 8)  Obesity  (ICD-278.00) 9)  Osteoarthritis  (ICD-715.90) 10)  Gerd  (ICD-530.81) 11)  Allergic Rhinitis  (ICD-477.9) 12)  Diabetes Mellitus, Type II  (ICD-250.00) 13)  Cellulitis, Leg, Right  (ICD-682.6) 14)  Special Screening For Osteoporosis  (ICD-V82.81) 15)  Neoplasm, Skin, Uncertain Behavior  (ICD-238.2) 16)  Urinary Incontinence  (ICD-788.30) 17)  Hypertension  (ICD-401.9) 18)  Hyperlipidemia  (ICD-272.4) 19)  Verruca Vulgaris  (ICD-078.10) 20)  Actinic Keratosis  (ICD-702.0) 21)  Seborrheic Keratosis  (ICD-702.19)  Current Medications (verified): 1)  Amlodipine Besylate 5 Mg Tabs (Amlodipine Besylate) .... Take 1 Tablet By Mouth Once A Day 2)  Simvastatin 40 Mg Tabs (Simvastatin) .... Take 1 Tablet By Mouth Once A Day 3)  Omeprazole 20 Mg Tbec (Omeprazole) .... Take 1 Tablet By Mouth Once A Day 4)  Metoprolol Succinate 100 Mg Xr24h-Tab (Metoprolol Succinate) .... 1/2   Tablet By Mouth Daily 5)  Otc Potassium .Marland Kitchen.. 1 By Mouth Once Daily 6)  Adult Aspirin Ec Low Strength 81 Mg  Tbec (Aspirin) .... Take 1 Tablet By Mouth Once A Day 7)  Fish Oil Concentrate 1000 Mg  Caps (Omega-3 Fatty Acids) .... Take 1 Tablet By Mouth Once A Day 8)  Bl Multiple Vitamins   Tabs (Multiple Vitamins-Minerals) .... Take 1 Tablet By Mouth Once A Day 9)  Calcium 600/vitamin D 600-200 Mg-Unit  Tabs (Calcium Carbonate-Vitamin D) .... Take 1 Tablet By Mouth Daily 10)  Bl Magnesium 250 Mg  Tabs (Magnesium) .... Take 1 Tablet By Mouth Once A Day 11)  Co Q-10 120 Mg  Caps (Coenzyme Q10) .... Take 1 Capsule By Mouth Once A Day 12)  Glucometer of Choice .... Check Blood Sugar Daily  Dx 250.00 13)  True Track Glucometer Test Strips .... Check Blood Sugar Once A Day  Allergies (verified): 1)  ! Codeine  Past History:  Past Medical History: Last updated: 2007-06-18 Hyperlipidemia Hypertension Urinary incontinence Allergic rhinitis GERD Osteoarthritis  Past Surgical History: Last updated: 05/28/2009 Echo Stress Test Rexene Edison. Smith:  Cardiolite in 2004.  Normal) DEXA (-) 2007 Cholecystectomy 1990's TA Hysterectomy 1993  Family History: Last updated: 18-Jun-2007 Father: Died 45 MI Mother: Died 66, dementia, CVA Siblings: 1 sister, ovarian CA, age 60 DM:  Maternal side Ovarian CA:  Sister, grandmother, great aunt  Social History: Last updated: 18-Jun-2007 Never Smoked Alcohol use-no Drug use-no Regular exercise-yes, Curves 2x/week Marital Status: Married x 48 years Children: None Occupation:  [employer], works part-time as Asst. to [employer] Diet:  Fruit and veggies, no FF, some water, artificial sweetener  Risk Factors: Alcohol Use: <1 (05/28/2009) Caffeine Use: 1-2  cup a day (05/28/2009) Exercise: no  (05/28/2009)  Risk Factors: Smoking Status: never (05/28/2009)  Review of Systems  The patient denies fever, weight loss, weight gain, vision loss, decreased  hearing, hoarseness, chest pain, syncope, dyspnea on exertion, peripheral edema, prolonged cough, abdominal pain, incontinence, muscle weakness, depression, and enlarged lymph nodes.         Palpitations  Vital Signs:  Patient profile:   75 year old female Height:      62.75 inches Weight:      171 pounds BMI:     30.64 Pulse rate:   80 / minute Pulse rhythm:   regular BP sitting:   136 / 76  (left arm) Cuff size:   regular  Vitals Entered By: Mercer Pod (July 13, 2009 2:08 PM)  Physical Exam  General:  well-appearing woman in no apparent distress, HEENT exam is benign, oropharynx is clear, neck is supple with no JVP or carotid bruits, heart sounds are regular with normal S1-S2 and no murmurs appreciated, lungs are clear to auscultation with no wheezes Rales, abdominal exam is benign, she has no significant lower extremity edema, neurologic exam is grossly nonfocal, skin is warm and dry.   Impression & Recommendations:  Problem # 1:  PALPITATIONS (ICD-785.1) we changed her atenolol to metoprolol though she continues to have palpitations that are symptomatic. We will change her amlodipine to diltiazem CD 120 mg daily and continue her metoprolol 50 mg a day. If she has breakthrough tachyarrhythmias, she can take an additional 25 or 50 mg metoprolol. I've asked her to contact me with her blood pressures and heart rates over the next week or 2.  Her updated medication list for this problem includes:    Amlodipine Besylate 5 Mg Tabs (Amlodipine besylate) .Marland Kitchen... Take 1 tablet by mouth once a day    Metoprolol Succinate 100 Mg Xr24h-tab (Metoprolol succinate) .Marland Kitchen... Take one tablet by mouth daily    Adult Aspirin Ec Low Strength 81 Mg Tbec (Aspirin) .Marland Kitchen... Take 1 tablet by mouth once a day  Problem # 2:  HYPERTENSION (ICD-401.9) blood pressure is relatively well controlled on her current medications. We made a medication change as above and will have her monitor her blood  pressure.  Her updated medication list for this problem includes:    Diltiazem Hcl Cr 120 Mg Xr12h-cap (Diltiazem hcl) .Marland Kitchen... 1 tab by mouth daily    Metoprolol Succinate 100 Mg Xr24h-tab (Metoprolol succinate) .Marland Kitchen... 1/2  tablet by mouth daily    Adult Aspirin Ec Low Strength 81 Mg Tbec (Aspirin) .Marland Kitchen... Take 1 tablet by mouth once a day  Problem # 3:  HYPERLIPIDEMIA (ICD-272.4)  Ms. Copenhaver had held her simvastatin thinking it was contributing to her palpitations. she is back on her medications now with no symptoms.  Her updated medication list for this problem includes:    Simvastatin 40 Mg Tabs (Simvastatin) .Marland Kitchen... Take 1 tablet by mouth once a day -on hold due to heart racing  Patient Instructions: 1)  Your physician recommends that you schedule a follow-up appointment in: 1 month 2)  Your physician has recommended you make the following change in your medication: stop amlodipine, start diltiazem CR 120 mg daily Prescriptions: DILTIAZEM HCL CR 120 MG XR12H-CAP (DILTIAZEM HCL) 1 tab by mouth daily  #30 x 3  Entered by:   Charlena Cross, RN, BSN   Authorized by:   Dossie Arbour MD   Signed by:   Charlena Cross, RN, BSN on 07/13/2009   Method used:   Electronically to        [facility]* (retail)       [address] [place], [place]  [address]       Ph: [phone] or [phone]       Fax: [phone]   RxID:   [id number]

## 2010-06-14 NOTE — Progress Notes (Signed)
Summary: Heart racing  Phone Note Call from Patient Call back at [phone]   Caller: Patient Call For: Kerby Nora MD Summary of Call: Patient called to c/o of her heart racing and beating real hard off and on for about a week. Patient states that the other night her heart did this all night long, was fine last night, but then did it again this morning. Patient states that she feels real jittery now inside. Patient states that she has not had chest pain, but some fullness feeling in her chest, but not much. No SOB. Patient states that she is on her way to see the surgeon that did her biopsy the other day. Initial call taken by: Sydell Axon LPN,  May 27, 2009 10:16 AM  Follow-up for Phone Call        discussed Follow-up by: Hannah Beat MD,  May 27, 2009 10:20 AM  Additional Follow-up for Phone Call Additional follow up Details #1::        Patient advised to come to the office today at 2:00PM to see Dr. Patsy Lager. Patient advised that if symptoms get worse before her appt she should go to the ER. Additional Follow-up by: Sydell Axon LPN,  May 27, 2009 10:24 AM

## 2010-06-14 NOTE — Letter (Signed)
Summary: Janet Chapman, OD  Janet Chapman, OD   Imported By: Lanelle Bal 02/17/2009 08:16:17  _____________________________________________________________________  External Attachment:    Type:   Image     Comment:   External Document  Appended Document: Orders Update    Clinical Lists Changes  Observations: Added new observation of DMEYEEXMRES: normal (03/07/2009 23:07) Added new observation of DIAB EYE EX: normal (03/07/2009 23:07) Added new observation of EYES COMMENT: 03/2010 (02/17/2009 23:06)       Diabetes Management History:      She says that she is exercising.    Diabetes Management Exam:    Eye Exam:       Eye Exam done elsewhere          Date: 03/07/2009          Results: normal          Done by: eye MD  Diabetes Management Assessment/Plan:      The following lipid goals have been established for the patient: Total cholesterol goal of 200; LDL cholesterol goal of 100; HDL cholesterol goal of 40; Triglyceride goal of 150.  Her blood pressure goal is < 130/80.

## 2010-06-14 NOTE — Assessment & Plan Note (Signed)
Summary: CHECK MOLE,WARTS/CLE  Medications Added AMLODIPINE BESYLATE 5 MG TABS (AMLODIPINE BESYLATE) Take 1 tablet by mouth once a day SIMVASTATIN 20 MG TABS (SIMVASTATIN) Take 1 tablet by mouth every night PRILOSEC 20 MG CPDR (OMEPRAZOLE) Take 1 capsule by mouth once a day ATENOLOL-CHLORTHALIDONE 50-25 MG TABS (ATENOLOL-CHLORTHALIDONE) 1 tablet by mouth once a day POTASSIUM CHLORIDE CRYS CR 20 MEQ TBCR (POTASSIUM CHLORIDE CRYS CR)  ADULT ASPIRIN EC LOW STRENGTH 81 MG  TBEC (ASPIRIN) Take 1 tablet by mouth once a day FISH OIL CONCENTRATE 1000 MG  CAPS (OMEGA-3 FATTY ACIDS) Take 1 tablet by mouth once a day BL MULTIPLE VITAMINS   TABS (MULTIPLE VITAMINS-MINERALS) Take 1 tablet by mouth once a day CLARITIN 10 MG  TABS (LORATADINE) Take 1 tablet by mouth once a day as needed ZYRTEC ALLERGY 10 MG  TABS (CETIRIZINE HCL) Take 1 tablet by mouth once a day CALCIUM 600/VITAMIN D 600-200 MG-UNIT  TABS (CALCIUM CARBONATE-VITAMIN D) Take 1 tablet by mouth two times a day BL MAGNESIUM 250 MG  TABS (MAGNESIUM) Take 1 tablet by mouth once a day GLUCOSAMINE-CHONDROITIN 250-200 MG  CAPS (GLUCOSAMINE-CHONDROITIN) Take 1 tablet by mouth once a day CO Q-10 120 MG  CAPS (COENZYME Q10) Take 1 capsule by mouth once a day      Allergies Added: ! CODEINE  Procedure Note  Wart Removal: Onset of lesion: > 3 months Consent signed: no  Procedure # 1: wart destruction    Region: dorsal    Location: right forearm    Technique: cryotherapy, 3 fereeze thaw cycles    Anesthesia: none  Procedure # 2: actinic keratosis    Technique: cryotherapy, 3 freeze thaw cycles    Anesthesia: none   Vital Signs:  Patient Profile:   75 Years Old Female Weight:      178 pounds Temp:     98.4 degrees F oral Pulse rate:   63 / minute BP sitting:   149 / 82  (left arm) Cuff size:   regular  Vitals Entered By: Cooper Render (February 01, 2007 11:27 AM)                 Chief Complaint:  ck moles, warts, and ? re  diverticulosis.  History of Present Illness: mole on back, unsure if changing, first time it has been noted  also lesion on left forearm, has been treating with medicated banage, has been present months, warty appearance  Current Allergies: ! CODEINE      Physical Exam  General:     Well-developed,well-nourished,in no acute distress; alert,appropriate and cooperative throughout examination Skin:     actinic keratosis left forearm, seborrheic keratosis on back, and wart(s), right forearm.      Impression & Recommendations:  Problem # 1:  VERRUCA VULGARIS (ICD-078.10)  Orders: Cryotherapy/Destruction benign or premalignant lesion (2nd-14th lesions) (17003) Cryotherapy/Destruction benign or premalignant lesion (1st lesion)  (17000)   Problem # 2:  ACTINIC KERATOSIS (ICD-702.0)  Orders: Cryotherapy/Destruction benign or premalignant lesion (2nd-14th lesions) (17003) Cryotherapy/Destruction benign or premalignant lesion (1st lesion)  (17000)   Problem # 3:  SEBORRHEIC KERATOSIS (ICD-702.19) non worrisome, pt reassured no treatment needed  Complete Medication List: 1)  Amlodipine Besylate 5 Mg Tabs (Amlodipine besylate) .... Take 1 tablet by mouth once a day 2)  Simvastatin 20 Mg Tabs (Simvastatin) .... Take 1 tablet by mouth every night 3)  Prilosec 20 Mg Cpdr (Omeprazole) .... Take 1 capsule by mouth once a day 4)  Atenolol-chlorthalidone 50-25 Mg Tabs (  Atenolol-chlorthalidone) .Marland Kitchen.. 1 tablet by mouth once a day 5)  Potassium Chloride Crys Cr 20 Meq Tbcr (Potassium chloride crys cr) 6)  Adult Aspirin Ec Low Strength 81 Mg Tbec (Aspirin) .... Take 1 tablet by mouth once a day 7)  Fish Oil Concentrate 1000 Mg Caps (Omega-3 fatty acids) .... Take 1 tablet by mouth once a day 8)  Bl Multiple Vitamins Tabs (Multiple vitamins-minerals) .... Take 1 tablet by mouth once a day 9)  Claritin 10 Mg Tabs (Loratadine) .... Take 1 tablet by mouth once a day as needed 10)  Zyrtec  Allergy 10 Mg Tabs (Cetirizine hcl) .... Take 1 tablet by mouth once a day 11)  Calcium 600/vitamin D 600-200 Mg-unit Tabs (Calcium carbonate-vitamin d) .... Take 1 tablet by mouth two times a day 12)  Bl Magnesium 250 Mg Tabs (Magnesium) .... Take 1 tablet by mouth once a day 13)  Glucosamine-chondroitin 250-200 Mg Caps (Glucosamine-chondroitin) .... Take 1 tablet by mouth once a day 14)  Co Q-10 120 Mg Caps (Coenzyme q10) .... Take 1 capsule by mouth once a day     ]

## 2010-06-14 NOTE — Letter (Signed)
Summary: Letter From Patient  Letter From Patient   Imported By: Harlon Flor 07/28/2009 08:37:46  _____________________________________________________________________  External Attachment:    Type:   Image     Comment:   External Document

## 2010-06-14 NOTE — Assessment & Plan Note (Signed)
Summary: heart racing   Vital Signs:  Patient profile:   75 year old female Height:      62.75 inches Weight:      168.6 pounds BMI:     30.21 Temp:     97.8 degrees F oral Pulse rate:   61 / minute Pulse rhythm:   regular BP sitting:   120 / 78  (left arm)  Vitals Entered By: Benny Lennert CMA Duncan Dull) (May 27, 2009 2:15 PM)  History of Present Illness: Chief complaint heart racing  75 year old female patient of Dr. Daphine Deutscher:  Spells where her heart is beating really hard. Will beat for about a minute or so, and for the past week has been more often.   A couple night ago, has been ongoing more. Feels like it is being fast. She has checked her pulse when having these sensations an they have been norrmal.  No pain in the chest,  Feels like she is aware and feels the heart beating more and harder.  Like it is beating hard and faster.   Four or five years, had a stress test that was normal at [facility] by Dr. Katrinka Blazing.  NO SOB, DOE. No orthopnea. No edema.  Allergies: 1)  ! Codeine  Past History:  Past medical, surgical, family and social histories (including risk factors) reviewed, and no changes noted (except as noted below).  Past Medical History: Reviewed history from 05/31/2007 and no changes required. Hyperlipidemia Hypertension Urinary incontinence Allergic rhinitis GERD Osteoarthritis  Past Surgical History: Reviewed history from 05/31/2007 and no changes required. Echo Stress Test DEXA (-) 2007 Cholecystectomy 1990's TA Hysterectomy 1993  Family History: Reviewed history from 05/31/2007 and no changes required. Father: Died 16 MI Mother: Died 62, dementia, CVA Siblings: 1 sister, ovarian CA, age 50 DM:  Maternal side Ovarian CA:  Sister, grandmother, great aunt  Social History: Reviewed history from 05/31/2007 and no changes required. Never Smoked Alcohol use-no Drug use-no Regular exercise-yes, Curves 2x/week Marital Status:  Married x 48 years Children: None Occupation: Scientist, product/process development, works part-time as Asst. to [employer] Diet:  Fruit and veggies, no FF, some water, artificial sweetener  Review of Systems      See HPI General:  Denies chills, fatigue, and fever. CV:  See HPI; Complains of palpitations. Resp:  Denies cough, shortness of breath, sputum productive, and wheezing.  Physical Exam  General:  Well-developed,well-nourished,in no acute distress; alert,appropriate and cooperative throughout examination Head:  Normocephalic and atraumatic without obvious abnormalities. No apparent alopecia or balding. Ears:  no external deformities.   Nose:  no external deformity.   Mouth:  MMM Neck:  No deformities, masses, or tenderness noted. Lungs:  Normal respiratory effort, chest expands symmetrically. Lungs are clear to auscultation, no crackles or wheezes. Heart:  Normal rate and regular rhythm. S1 and S2 normal without gallop, murmur, click, rub or other extra sounds. Extremities:  No clubbing, cyanosis, edema, or deformity noted with normal full range of motion of all joints.   Neurologic:  alert & oriented X3 and gait normal.   Cervical Nodes:  No lymphadenopathy noted Psych:  Cognition and judgment appear intact. Alert and cooperative with normal attention span and concentration. No apparent delusions, illusions, hallucinations   Impression & Recommendations:  Problem # 1:  PALPITATIONS (ICD-785.1) EKG: Normal sinus rhythm. Normal axis, normal R wave progression, No acute ST elevation or depression.   the patient is not having any abnormal  rhythm currently, she is not having  any  palpitations or  anything that I can capture on her EKG.  What is unusual by this case is that the patient states that she is having palpitations, feels her heart is racing, feels her heart is beating really hard, however she states in when she is checking her pulse, during these  time periods,  but her pulse  appears to  be normal. These episodes last anywhere from a minute to longer than this.  she does not have a history of anxiety or in the appearance of any sort of anxiety whatsoever in the office.  I'm somewhat unclear how to proceed with this case. Think he would be reasonable for her to have a Holter monitor and cardiac workup, but in this case I would like to get the cardiologist opinion. I appreciate their assistance  Her updated medication list for this problem includes:    Atenolol-chlorthalidone 50-25 Mg Tabs (Atenolol-chlorthalidone) .Marland Kitchen... 1 tablet by mouth once a day  Orders: Cardiology Referral (Cardiology)  Complete Medication List: 1)  Amlodipine Besylate 5 Mg Tabs (Amlodipine besylate) .... Take 1 tablet by mouth once a day 2)  Simvastatin 40 Mg Tabs (Simvastatin) .... Take 1 tablet by mouth once a day 3)  Omeprazole 20 Mg Tbec (Omeprazole) .... Take 1 tablet by mouth once a day 4)  Atenolol-chlorthalidone 50-25 Mg Tabs (Atenolol-chlorthalidone) .Marland Kitchen.. 1 tablet by mouth once a day 5)  Potassium Chloride Crys Cr 20 Meq Tbcr (Potassium chloride crys cr) .Marland Kitchen.. 1 tab/.2  tab  by mouth every other day 6)  Adult Aspirin Ec Low Strength 81 Mg Tbec (Aspirin) .... Take 1 tablet by mouth once a day 7)  Fish Oil Concentrate 1000 Mg Caps (Omega-3 fatty acids) .... Take 1 tablet by mouth once a day 8)  Bl Multiple Vitamins Tabs (Multiple vitamins-minerals) .... Take 1 tablet by mouth once a day 9)  Calcium 600/vitamin D 600-200 Mg-unit Tabs (Calcium carbonate-vitamin d) .... Take 1 tablet by mouth two times a day 10)  Bl Magnesium 250 Mg Tabs (Magnesium) .... Take 1 tablet by mouth once a day 11)  Glucosamine-chondroitin 250-200 Mg Caps (Glucosamine-chondroitin) .... Take 1 tablet by mouth once a day 12)  Co Q-10 120 Mg Caps (Coenzyme q10) .... Take 1 capsule by mouth once a day 13)  Glucometer of Choice  .... Check blood sugar daily  dx 250.00 14)  True Track Glucometer Test Strips  .... Check blood  sugar once a day  Patient Instructions: 1)  Referral Appointment Information 2)  Day/Date: 3)  Time: 4)  Place/MD: 5)  Address: 6)  Phone/Fax: 7)  Patient given appointment information. Information/Orders faxed/mailed.   Current Allergies (reviewed today): ! CODEINE

## 2010-06-14 NOTE — Procedures (Signed)
Summary: [facility]   Imported By: Harlon Flor 06/29/2009 10:17:51  _____________________________________________________________________  External Attachment:    Type:   Image     Comment:   External Document

## 2010-06-14 NOTE — Progress Notes (Signed)
Summary: PHI  PHI   Imported By: Harlon Flor 05/28/2009 16:06:58  _____________________________________________________________________  External Attachment:    Type:   Image     Comment:   External Document

## 2010-06-14 NOTE — Progress Notes (Signed)
Summary: regarding drug interaction  Phone Note From Other Clinic   Caller: [facility] Summary of Call: Letter regarding interaction between diflucan and simvastatin is on your desk. Initial call taken by: Lowella Petties CMA, AAMA,  March 11, 2010 4:39 PM  Follow-up for Phone Call        thank you.  It was a one time dose. Ruthe Mannan MD  March 14, 2010 8:10 AM

## 2010-06-14 NOTE — Assessment & Plan Note (Signed)
Summary: ROA 3 MTHS  CYD   Vital Signs:  Patient profile:   75 year old female Height:      62.75 inches Weight:      170.6 pounds BMI:     30.57 Temp:     98.3 degrees F Pulse rate:   72 / minute Pulse rhythm:   regular BP sitting:   122 / 70  (left arm) Cuff size:   regular  Vitals Entered By: Benny Lennert CMA Duncan Dull) (January 28, 2009 11:49 AM)  History of Present Illness: Chief complaint 3 month follow up  Working on diet and some exercise. Has lost 5 lbs.  Feeling well overall.   Dm, well controlled with diet and exercsie.   Diabetes Management History:      She says that she is exercising.    Hypertension History:      She denies headache, chest pain, palpitations, dyspnea with exertion, orthopnea, peripheral edema, neurologic problems, and syncope.  She notes no problems with any antihypertensive medication side effects.        Positive major cardiovascular risk factors include female age 27 years old or older, diabetes, hyperlipidemia, and hypertension.  Negative major cardiovascular risk factors include non-tobacco-user status.    Lipid Management History:      Positive NCEP/ATP III risk factors include female age 64 years old or older, diabetes, and hypertension.  Negative NCEP/ATP III risk factors include non-tobacco-user status.        Her compliance with the TLC diet is good.  Adjunctive measures started by the patient include aerobic exercise, fiber, and weight reduction.  She expresses no side effects from her lipid-lowering medication.  The patient denies any symptoms to suggest myopathy or liver disease.      Allergies: 1)  ! Codeine  Review of Systems General:  Denies fatigue and fever. CV:  Denies chest pain or discomfort. Resp:  Denies shortness of breath. GI:  Denies abdominal pain, bloody stools, constipation, and diarrhea.  Physical Exam  General:  overweight female in NAD Mouth:  MMM Neck:  no carotid bruit or thyromegaly no cervical or  supraclavicular lymphadenopathy  Lungs:  Normal respiratory effort, chest expands symmetrically. Lungs are clear to auscultation, no crackles or wheezes. Heart:  Normal rate and regular rhythm. S1 and S2 normal without gallop, murmur, click, rub or other extra sounds. Pulses:  R and L posterior tibial pulses are full and equal bilaterally  Extremities:  no edema, B varicosities  Diabetes Management Exam:    Foot Exam (with socks and/or shoes not present):       Sensory-Pinprick/Light touch:          Left medial foot (L-4): normal          Left dorsal foot (L-5): normal          Left lateral foot (S-1): normal          Right medial foot (L-4): normal          Right dorsal foot (L-5): normal          Right lateral foot (S-1): normal       Sensory-Monofilament:          Left foot: normal          Right foot: normal       Inspection:          Left foot: normal          Right foot: normal       Nails:  Left foot: normal          Right foot: normal    Eye Exam:       Eye Exam done elsewhere          Date: 01/13/2009          Results: normal          Done by: eye MD   Impression & Recommendations:  Problem # 1:  DIABETES MELLITUS, TYPE II (ICD-250.00) Well controlled. No microalbuminuria. Encouraged exercise, weight loss, healthy eating habits.  Her updated medication list for this problem includes:    Adult Aspirin Ec Low Strength 81 Mg Tbec (Aspirin) .Marland Kitchen... Take 1 tablet by mouth once a day  Labs Reviewed: Creat: 0.8 (01/25/2009)     Last Eye Exam: normal (01/13/2009) Reviewed HgBA1c results: 6.3 (01/25/2009)  6.3 (11/04/2008)  Problem # 2:  HYPERTENSION (ICD-401.9) Well controlled. Continue current medication.  Her updated medication list for this problem includes:    Amlodipine Besylate 5 Mg Tabs (Amlodipine besylate) .Marland Kitchen... Take 1 tablet by mouth once a day    Atenolol-chlorthalidone 50-25 Mg Tabs (Atenolol-chlorthalidone) .Marland Kitchen... 1 tablet by mouth once a day  BP  today: 122/70 Prior BP: 126/74 (11/26/2008)  10 Yr Risk Heart Disease: 17 % Prior 10 Yr Risk Heart Disease: Not enough information (04/30/2007)  Labs Reviewed: K+: 3.5 (01/25/2009) Creat: : 0.8 (01/25/2009)   Chol: 185 (01/25/2009)   HDL: 56.20 (01/25/2009)   LDL: 103 (01/25/2009)   TG: 130.0 (01/25/2009)  Problem # 3:  HYPOKALEMIA (ICD-276.8) resolved  Problem # 4:  HYPERLIPIDEMIA (ICD-272.4)  Almost at goal on low dose simvastain. Continue work on lifestyle...has not tolerated higher dose of statins in past.  Her updated medication list for this problem includes:    Simvastatin 20 Mg Tabs (Simvastatin) .Marland Kitchen... Take 1 tablet by mouth every night  Labs Reviewed: SGOT: 26 (01/25/2009)   SGPT: 19 (01/25/2009)  Lipid Goals: Chol Goal: 200 (01/28/2009)   HDL Goal: 40 (01/28/2009)   LDL Goal: 100 (01/28/2009)   TG Goal: 150 (01/28/2009)  10 Yr Risk Heart Disease: 17 % Prior 10 Yr Risk Heart Disease: Not enough information (04/30/2007)   HDL:56.20 (01/25/2009), 53.60 (07/29/2008)  LDL:103 (01/25/2009), 100 (07/29/2008)  Chol:185 (01/25/2009), 171 (07/29/2008)  Trig:130.0 (01/25/2009), 85.0 (07/29/2008)  Complete Medication List: 1)  Amlodipine Besylate 5 Mg Tabs (Amlodipine besylate) .... Take 1 tablet by mouth once a day 2)  Simvastatin 20 Mg Tabs (Simvastatin) .... Take 1 tablet by mouth every night 3)  Omeprazole 20 Mg Tbec (Omeprazole) .... Take 1 tablet by mouth once a day 4)  Atenolol-chlorthalidone 50-25 Mg Tabs (Atenolol-chlorthalidone) .Marland Kitchen.. 1 tablet by mouth once a day 5)  Potassium Chloride Crys Cr 20 Meq Tbcr (Potassium chloride crys cr) .Marland Kitchen.. 1 tab/.2  tab  by mouth every other day 6)  Adult Aspirin Ec Low Strength 81 Mg Tbec (Aspirin) .... Take 1 tablet by mouth once a day 7)  Fish Oil Concentrate 1000 Mg Caps (Omega-3 fatty acids) .... Take 1 tablet by mouth once a day 8)  Bl Multiple Vitamins Tabs (Multiple vitamins-minerals) .... Take 1 tablet by mouth once a day 9)   Calcium 600/vitamin D 600-200 Mg-unit Tabs (Calcium carbonate-vitamin d) .... Take 1 tablet by mouth two times a day 10)  Bl Magnesium 250 Mg Tabs (Magnesium) .... Take 1 tablet by mouth once a day 11)  Glucosamine-chondroitin 250-200 Mg Caps (Glucosamine-chondroitin) .... Take 1 tablet by mouth once a day 12)  Co Q-10  120 Mg Caps (Coenzyme q10) .... Take 1 capsule by mouth once a day 13)  Ginkgo 60 Mg Tabs (Ginkgo biloba) .... Take 1 tablet by mouth once a day 14)  Glucometer of Choice  .... Check blood sugar daily  dx 250.00 15)  True Track Glucometer Test Strips  .... Check blood sugar once a day  Diabetes Management Assessment/Plan:      The following lipid goals have been established for the patient: Total cholesterol goal of 200; LDL cholesterol goal of 100; HDL cholesterol goal of 40; Triglyceride goal of 150.  Her blood pressure goal is < 130/80.    Hypertension Assessment/Plan:      The patient's hypertensive risk group is category C: Target organ damage and/or diabetes.  Her calculated 10 year risk of coronary heart disease is 17 %.  Today's blood pressure is 122/70.  Her blood pressure goal is < 130/80.  Lipid Assessment/Plan:      Based on NCEP/ATP III, the patient's risk factor category is "history of diabetes".  The patient's lipid goals are as follows: Total cholesterol goal is 200; LDL cholesterol goal is 100; HDL cholesterol goal is 40; Triglyceride goal is 150.  Her LDL cholesterol goal has not been met.    Patient Instructions: 1)  BMP prior to visit, ICD-9: 250.00 2)  Hepatic Panel prior to visit ICD-9:  3)  Lipid panel prior to visit ICD-9 :  4)  HgBA1c prior to visit  ICD-9:  5)  Please schedule a follow-up appointment in 3 months  DM  Current Allergies (reviewed today): ! CODEINE

## 2010-06-14 NOTE — Assessment & Plan Note (Signed)
Summary: ec6   Visit Type:  ec6 Referring Provider:  Kerin Perna, MD Primary Provider:  Kerby Nora MD  CC:  no cp, no sob, and heart beats feel like coming out of chest.. took an extra atenolol when have some episodes. yesterday chest was beating so hard.. no sob.. no edema....  History of Present Illness:  75 year old with a history of hypertension, dyslipidemia, who is following up for evaluation of her palpitations.  Janet Chapman had a event monitor that has been completed that showed no significant arrhythmia. She states having symptoms while wearing the monitor as well as since she is gave the monitor back in. she took an  extra half dose of her atenolol with improvement of her palpitations over the weekend. she states that most of her palpitations are at nighttime. She takes her atenolol in the morning. She denies any chest pain, shortness of breath or dizziness with her palpitations. She describes it as fast and strong. She has been taking other over-the-counter supplements such as in chondroiten sulfate.     Current Problems (verified): 1)  Palpitations  (ICD-785.1) 2)  Hypokalemia  (ICD-276.8) 3)  Mammogram, Abnormal, Left  (ICD-793.80) 4)  Other Malaise and Fatigue  (ICD-780.79) 5)  Uti  (ICD-599.0) 6)  Constipation  (ICD-564.00) 7)  Nausea  (ICD-787.02) 8)  Obesity  (ICD-278.00) 9)  Osteoarthritis  (ICD-715.90) 10)  Gerd  (ICD-530.81) 11)  Allergic Rhinitis  (ICD-477.9) 12)  Diabetes Mellitus, Type II  (ICD-250.00) 13)  Cellulitis, Leg, Right  (ICD-682.6) 14)  Special Screening For Osteoporosis  (ICD-V82.81) 15)  Neoplasm, Skin, Uncertain Behavior  (ICD-238.2) 16)  Urinary Incontinence  (ICD-788.30) 17)  Hypertension  (ICD-401.9) 18)  Hyperlipidemia  (ICD-272.4) 19)  Verruca Vulgaris  (ICD-078.10) 20)  Actinic Keratosis  (ICD-702.0) 21)  Seborrheic Keratosis  (ICD-702.19)  Current Medications (verified): 1)  Amlodipine Besylate 5 Mg Tabs (Amlodipine Besylate)  .... Take 1 Tablet By Mouth Once A Day 2)  Simvastatin 40 Mg Tabs (Simvastatin) .... Take 1 Tablet By Mouth Once A Day -On Hold Due To Heart Racing 3)  Omeprazole 20 Mg Tbec (Omeprazole) .... Take 1 Tablet By Mouth Once A Day 4)  Atenolol-Chlorthalidone 50-25 Mg Tabs (Atenolol-Chlorthalidone) .Marland Kitchen.. 1 Tablet By Mouth Once A Day 5)  Otc Potassium .Marland Kitchen.. 1 By Mouth Once Daily 6)  Adult Aspirin Ec Low Strength 81 Mg  Tbec (Aspirin) .... Take 1 Tablet By Mouth Once A Day 7)  Fish Oil Concentrate 1000 Mg  Caps (Omega-3 Fatty Acids) .... Take 1 Tablet By Mouth Once A Day 8)  Bl Multiple Vitamins   Tabs (Multiple Vitamins-Minerals) .... Take 1 Tablet By Mouth Once A Day 9)  Calcium 600/vitamin D 600-200 Mg-Unit  Tabs (Calcium Carbonate-Vitamin D) .... Take 1 Tablet By Mouth Daily 10)  Bl Magnesium 250 Mg  Tabs (Magnesium) .... Take 1 Tablet By Mouth Once A Day 11)  Glucosamine Sulfate 1000 Mg Caps (Glucosamine Sulfate) .Marland Kitchen.. 1 By Mouth Once Daily 12)  Co Q-10 120 Mg  Caps (Coenzyme Q10) .... Take 1 Capsule By Mouth Once A Day 13)  Glucometer of Choice .... Check Blood Sugar Daily  Dx 250.00 14)  True Track Glucometer Test Strips .... Check Blood Sugar Once A Day  Allergies (verified): 1)  ! Codeine  Past History:  Past Medical History: Last updated: 05/31/2007 Hyperlipidemia Hypertension Urinary incontinence Allergic rhinitis GERD Osteoarthritis  Past Surgical History: Last updated: 05/28/2009 Echo Stress Test Rexene Edison. Smith:  Cardiolite in 2004.  Normal) DEXA (-) 2007 Cholecystectomy 1990's TA Hysterectomy 1993  Family History: Last updated: June 05, 2007 Father: Died 68 MI Mother: Died 56, dementia, CVA Siblings: 1 sister, ovarian CA, age 79 DM:  Maternal side Ovarian CA:  Sister, grandmother, great aunt  Social History: Last updated: 2007/06/05 Never Smoked Alcohol use-no Drug use-no Regular exercise-yes, Curves 2x/week Marital Status: Married x 48 years Children: None Occupation:  Scientist, product/process development, works part-time as Asst. to [employer] Diet:  Fruit and veggies, no FF, some water, artificial sweetener  Risk Factors: Alcohol Use: <1 (05/28/2009) Caffeine Use: 1-2  cup a day (05/28/2009) Exercise: no  (05/28/2009)  Risk Factors: Smoking Status: never (05/28/2009)  Vital Signs:  Patient profile:   75 year old female Height:      62.75 inches Weight:      168.50 pounds BMI:     30.20 Pulse rate:   60 / minute Pulse rhythm:   regular BP sitting:   102 / 64  (left arm) Cuff size:   regular  Vitals Entered By: Mercer Pod (June 29, 2009 2:27 PM)  Physical Exam  General:  well-appearing elderly woman in no apparent distress. Her HEENT exam is benign. Neck is supple with no JVP or carotid bruits. Heart sounds are regular with normal S1-S2 and no murmurs appreciated. Lungs are clear to auscultation with no wheezes or rales. Abdominal exam is benign. She has no significant lower extremity edema. Neurological exam is grossly nonfocal. Pulses are equal and symmetrical in her upper and lower extremities. Skin is warm and dry. She is alert and oriented.    Impression & Recommendations:  Problem # 1:  PALPITATIONS (ICD-785.1) etiology of her palpitations is still not clear despite wearing a recent event monitor. There was no significant tachycardia or other arrhythmia noted. She did feel better on an extra half dose of her atenolol. We have changed her atenolol HCT to metoprolol succinate 100 mg XL. We have suggested that she try a half dose to start with a quarter dose p.r.n. She'll call us with additional symptoms of palpitations. If she has multi-to palpitations, we could change her amlodipine to a calcium channel blocker such as diltiazem or verapamil. Her updated medication list for this problem includes:    Amlodipine Besylate 5 Mg Tabs (Amlodipine besylate) .Marland Kitchen... Take 1 tablet by mouth once a day    Metoprolol Succinate 100 Mg Xr24h-tab (Metoprolol  succinate) .Marland Kitchen... Take one tablet by mouth daily    Adult Aspirin Ec Low Strength 81 Mg Tbec (Aspirin) .Marland Kitchen... Take 1 tablet by mouth once a day  Problem # 2:  HYPERTENSION (ICD-401.9) blood pressure is borderline low on today's visit. We will change her atenolol diuretic regiment to metoprolol succinate. If blood pressure continues to be low, we could discontinue her amlodipine. She states that she has been losing weight over the past year and this may have resulted in lower blood pressure. Her updated medication list for this problem includes:    Amlodipine Besylate 5 Mg Tabs (Amlodipine besylate) .Marland Kitchen... Take 1 tablet by mouth once a day    Metoprolol Succinate 100 Mg Xr24h-tab (Metoprolol succinate) .Marland Kitchen... Take one tablet by mouth daily    Adult Aspirin Ec Low Strength 81 Mg Tbec (Aspirin) .Marland Kitchen... Take 1 tablet by mouth once a day  Problem # 3:  HYPERLIPIDEMIA (ICD-272.4) Janet Chapman had held her simvastatin thinking it was contributing to her palpitations. She will restart simvastatin 40 mg daily as holding the medicine has not improved her symptoms  Her updated medication list for this problem includes:    Simvastatin 40 Mg Tabs (Simvastatin) .Marland Kitchen... Take 1 tablet by mouth once a day -on hold due to heart racing Prescriptions: METOPROLOL SUCCINATE 100 MG XR24H-TAB (METOPROLOL SUCCINATE) Take one tablet by mouth daily  #30 x 3   Entered by:   Charlena Cross, RN, BSN   Authorized by:   Dossie Arbour MD   Signed by:   Charlena Cross, RN, BSN on 06/29/2009   Method used:   Electronically to        [facility]* (retail)       [address] [place], [place]  [address]       Ph: [phone] or [phone]       Fax: [phone]   RxID:   [id number]

## 2010-06-14 NOTE — Progress Notes (Signed)
Summary: CALL BACK  Phone Note Call from Patient Call back at Home Phone [phone]   Caller: SELF Call For: [facility] Summary of Call: [id number] THAT MELISSA CALLED HER-MAYBE SHE WANTS A CALL BACK????  NOT SURE Initial call taken by: Harlon Flor,  August 13, 2009 4:10 PM  Follow-up for Phone Call        pt had old message on answering machine. Follow-up by: Charlena Cross, RN, BSN,  August 13, 2009 4:25 PM

## 2010-06-14 NOTE — Letter (Signed)
Summary: Letter from pt. to [facility] w/ concerns about blood pressure  Letter from pt. to Dr.Janyth Riera w/ concerns about blood pressure   Imported By: Beau Fanny 01/13/2010 15:06:05  _____________________________________________________________________  External Attachment:    Type:   Image     Comment:   External Document  Appended Document: Letter from pt. to Dr.Frankee Gritz w/ concerns about blood pressure Addressed at today's OV.

## 2010-06-14 NOTE — Assessment & Plan Note (Signed)
Summary: RECK LEG/HEA   Vital Signs:  Patient Profile:   75 Years Old Female Height:     62.75 inches Weight:      179.38 pounds Temp:     98.3 degrees F oral Pulse rate:   68 / minute Pulse rhythm:   regular BP sitting:   130 / 70  (left arm) Cuff size:   regular  Vitals Entered ByMarland Kitchen Delilah Shan (April 30, 2007 11:45 AM)                 Chief Complaint:  Recent excision site is red and inflamed and painful.  History of Present Illness: S/P excision of worrisome skin lesion on 12/12 Now returns with redness and pain at excision site that appeared in the day after the procedure. Did not apply antibiotic ointment, she is washing it with warm soapy water, no bandage covering it redness has been gradually spreading  Current Allergies (reviewed today): ! CODEINE  Past Medical History:    Reviewed history from 04/19/2007 and no changes required:       Hyperlipidemia       Hypertension       Urinary incontinence   Family History:    Reviewed history and no changes required:    Review of Systems  The patient denies fever.    GI      Denies nausea and vomiting.   Physical Exam  General:     Well-developed,well-nourished,in no acute distress; alert,appropriate and cooperative throughout examination Skin:     3 cm diameter brown/black lesion (nitrate sticks used to stop bleeding) with 6 cm surrounding erythema and wramth    Impression & Recommendations:  Problem # 1:  CELLULITIS, LEG, RIGHT (ICD-682.6) Treat with antibiotic, will cover with MRSA given recent procedure.  Close follow up. Call if fever, N/V or spreading of redness on antibiotic.  Complete Medication List: 1)  Amlodipine Besylate 5 Mg Tabs (Amlodipine besylate) .... Take 1 tablet by mouth once a day 2)  Simvastatin 20 Mg Tabs (Simvastatin) .... Take 1 tablet by mouth every night 3)  Prilosec 20 Mg Cpdr (Omeprazole) .... Take 1 capsule by mouth once a day 4)  Atenolol-chlorthalidone 50-25  Mg Tabs (Atenolol-chlorthalidone) .Marland Kitchen.. 1 tablet by mouth once a day 5)  Potassium Chloride Crys Cr 20 Meq Tbcr (Potassium chloride crys cr) 6)  Adult Aspirin Ec Low Strength 81 Mg Tbec (Aspirin) .... Take 1 tablet by mouth once a day 7)  Fish Oil Concentrate 1000 Mg Caps (Omega-3 fatty acids) .... Take 1 tablet by mouth once a day 8)  Bl Multiple Vitamins Tabs (Multiple vitamins-minerals) .... Take 1 tablet by mouth once a day 9)  Claritin 10 Mg Tabs (Loratadine) .... Take 1 tablet by mouth once a day as needed 10)  Zyrtec Allergy 10 Mg Tabs (Cetirizine hcl) .... Take 1 tablet by mouth once a day 11)  Calcium 600/vitamin D 600-200 Mg-unit Tabs (Calcium carbonate-vitamin d) .... Take 1 tablet by mouth two times a day 12)  Bl Magnesium 250 Mg Tabs (Magnesium) .... Take 1 tablet by mouth once a day 13)  Glucosamine-chondroitin 250-200 Mg Caps (Glucosamine-chondroitin) .... Take 1 tablet by mouth once a day 14)  Co Q-10 120 Mg Caps (Coenzyme q10) .... Take 1 capsule by mouth once a day     ] Current Allergies (reviewed today): ! CODEINE Current Medications (including changes made in today's visit):  AMLODIPINE BESYLATE 5 MG TABS (AMLODIPINE BESYLATE) Take 1 tablet by mouth once  a day SIMVASTATIN 20 MG TABS (SIMVASTATIN) Take 1 tablet by mouth every night PRILOSEC 20 MG CPDR (OMEPRAZOLE) Take 1 capsule by mouth once a day ATENOLOL-CHLORTHALIDONE 50-25 MG TABS (ATENOLOL-CHLORTHALIDONE) 1 tablet by mouth once a day POTASSIUM CHLORIDE CRYS CR 20 MEQ TBCR (POTASSIUM CHLORIDE CRYS CR)  ADULT ASPIRIN EC LOW STRENGTH 81 MG  TBEC (ASPIRIN) Take 1 tablet by mouth once a day FISH OIL CONCENTRATE 1000 MG  CAPS (OMEGA-3 FATTY ACIDS) Take 1 tablet by mouth once a day BL MULTIPLE VITAMINS   TABS (MULTIPLE VITAMINS-MINERALS) Take 1 tablet by mouth once a day CLARITIN 10 MG  TABS (LORATADINE) Take 1 tablet by mouth once a day as needed ZYRTEC ALLERGY 10 MG  TABS (CETIRIZINE HCL) Take 1 tablet by mouth once a  day CALCIUM 600/VITAMIN D 600-200 MG-UNIT  TABS (CALCIUM CARBONATE-VITAMIN D) Take 1 tablet by mouth two times a day BL MAGNESIUM 250 MG  TABS (MAGNESIUM) Take 1 tablet by mouth once a day GLUCOSAMINE-CHONDROITIN 250-200 MG  CAPS (GLUCOSAMINE-CHONDROITIN) Take 1 tablet by mouth once a day CO Q-10 120 MG  CAPS (COENZYME Q10) Take 1 capsule by mouth once a day

## 2010-06-14 NOTE — Assessment & Plan Note (Signed)
Summary: Flu Shot  Nurse Visit    Prior Medications: AMLODIPINE BESYLATE 5 MG TABS (AMLODIPINE BESYLATE) Take 1 tablet by mouth once a day SIMVASTATIN 20 MG TABS (SIMVASTATIN) Take 1 tablet by mouth every night PRILOSEC 20 MG CPDR (OMEPRAZOLE) Take 1 capsule by mouth once a day ATENOLOL-CHLORTHALIDONE 50-25 MG TABS (ATENOLOL-CHLORTHALIDONE) 1 tablet by mouth once a day POTASSIUM CHLORIDE CRYS CR 20 MEQ TBCR (POTASSIUM CHLORIDE CRYS CR)  ADULT ASPIRIN EC LOW STRENGTH 81 MG  TBEC (ASPIRIN) Take 1 tablet by mouth once a day FISH OIL CONCENTRATE 1000 MG  CAPS (OMEGA-3 FATTY ACIDS) Take 1 tablet by mouth once a day BL MULTIPLE VITAMINS   TABS (MULTIPLE VITAMINS-MINERALS) Take 1 tablet by mouth once a day CALCIUM 600/VITAMIN D 600-200 MG-UNIT  TABS (CALCIUM CARBONATE-VITAMIN D) Take 1 tablet by mouth two times a day BL MAGNESIUM 250 MG  TABS (MAGNESIUM) Take 1 tablet by mouth once a day GLUCOSAMINE-CHONDROITIN 250-200 MG  CAPS (GLUCOSAMINE-CHONDROITIN) Take 1 tablet by mouth once a day CO Q-10 120 MG  CAPS (COENZYME Q10) Take 1 capsule by mouth once a day CIPRO 500 MG  TABS (CIPROFLOXACIN HCL) 1 two times a day for infection Current Allergies: ! CODEINE    Orders Added: 1)  Flu Vaccine 58yrs + [60630] 2)  Administration Flu vaccine [G0008]    ]             Flu Vaccine Consent Questions     Do you have a history of severe allergic reactions to this vaccine? no    Any prior history of allergic reactions to egg and/or gelatin? no    Do you have a sensitivity to the preservative Thimersol? no    Do you have a past history of Guillan-Barre Syndrome? no    Do you currently have an acute febrile illness? no    Have you ever had a severe reaction to latex? no    Vaccine information given and explained to patient? yes    Are you currently pregnant? no    Lot Number:[id number]   Exp Date:11/11/2008   Site Given  Left Deltoid IM

## 2010-06-14 NOTE — Miscellaneous (Signed)
Summary: mammo results  Clinical Lists Changes  Observations: Added new observation of MAMMO DUE: 11/2008 (11/12/2007 9:28) Added new observation of MAMMOGRAM: normal (11/12/2007 9:28)       Preventive Care Screening  Mammogram:    Date:  11/12/2007    Next Due:  11/2008    Results:  normal

## 2010-06-14 NOTE — Assessment & Plan Note (Signed)
Summary: 3 MONTH FU /MK   Vital Signs:  Patient profile:   75 year old female Height:      62.75 inches Weight:      170.6 pounds BMI:     30.57 Temp:     97.9 degrees F oral Pulse rate:   60 / minute Pulse rhythm:   regular BP sitting:   108 / 64  (left arm) Cuff size:   regular  Vitals Entered By: Benny Lennert CMA Duncan Dull) (May 11, 2009 12:31 PM)  History of Present Illness: Chief complaint 3 month follow up   Had  6 mo follow up mammogram [place] left breast...recommended sterotactic biopsy, US guided.   DM, well controlled  Lost weight at home..several pounds...no exercise.  Working on diet changes.    Hypertension History:      She denies headache, chest pain, palpitations, dyspnea with exertion, peripheral edema, neurologic problems, and side effects from treatment.        Positive major cardiovascular risk factors include female age 76 years old or older, diabetes, hyperlipidemia, and hypertension.  Negative major cardiovascular risk factors include non-tobacco-user status.    Lipid Management History:      Positive NCEP/ATP III risk factors include female age 55 years old or older, diabetes, and hypertension.  Negative NCEP/ATP III risk factors include HDL cholesterol greater than 60 and non-tobacco-user status.        Her compliance with the TLC diet is good.  Adjunctive measures started by the patient include aerobic exercise and weight reduction.  She expresses no side effects from her lipid-lowering medication.  Comments include: Incorrectly wrote in last note..past intolerance to higher dose of statin...per pt today never been on higher dose of medication. .      Problems Prior to Update: 1)  Hypokalemia  (ICD-276.8) 2)  Mammogram, Abnormal, Left  (ICD-793.80) 3)  Other Malaise and Fatigue  (ICD-780.79) 4)  Uti  (ICD-599.0) 5)  Constipation  (ICD-564.00) 6)  Nausea  (ICD-787.02) 7)  Obesity  (ICD-278.00) 8)  Osteoarthritis  (ICD-715.90) 9)  Gerd   (ICD-530.81) 10)  Allergic Rhinitis  (ICD-477.9) 11)  Diabetes Mellitus, Type II  (ICD-250.00) 12)  Cellulitis, Leg, Right  (ICD-682.6) 13)  Special Screening For Osteoporosis  (ICD-V82.81) 14)  Neoplasm, Skin, Uncertain Behavior  (ICD-238.2) 15)  Urinary Incontinence  (ICD-788.30) 16)  Hypertension  (ICD-401.9) 17)  Hyperlipidemia  (ICD-272.4) 18)  Verruca Vulgaris  (ICD-078.10) 19)  Actinic Keratosis  (ICD-702.0) 20)  Seborrheic Keratosis  (ICD-702.19)  Current Medications (verified): 1)  Amlodipine Besylate 5 Mg Tabs (Amlodipine Besylate) .... Take 1 Tablet By Mouth Once A Day 2)  Simvastatin 40 Mg Tabs (Simvastatin) .... Take 1 Tablet By Mouth Once A Day 3)  Omeprazole 20 Mg Tbec (Omeprazole) .... Take 1 Tablet By Mouth Once A Day 4)  Atenolol-Chlorthalidone 50-25 Mg Tabs (Atenolol-Chlorthalidone) .Marland Kitchen.. 1 Tablet By Mouth Once A Day 5)  Potassium Chloride Crys Cr 20 Meq Tbcr (Potassium Chloride Crys Cr) .Marland Kitchen.. 1 Tab/.2  Tab  By Mouth Every Other Day 6)  Adult Aspirin Ec Low Strength 81 Mg  Tbec (Aspirin) .... Take 1 Tablet By Mouth Once A Day 7)  Fish Oil Concentrate 1000 Mg  Caps (Omega-3 Fatty Acids) .... Take 1 Tablet By Mouth Once A Day 8)  Bl Multiple Vitamins   Tabs (Multiple Vitamins-Minerals) .... Take 1 Tablet By Mouth Once A Day 9)  Calcium 600/vitamin D 600-200 Mg-Unit  Tabs (Calcium Carbonate-Vitamin D) .... Take 1 Tablet By  Mouth Two Times A Day 10)  Bl Magnesium 250 Mg  Tabs (Magnesium) .... Take 1 Tablet By Mouth Once A Day 11)  Glucosamine-Chondroitin 250-200 Mg  Caps (Glucosamine-Chondroitin) .... Take 1 Tablet By Mouth Once A Day 12)  Co Q-10 120 Mg  Caps (Coenzyme Q10) .... Take 1 Capsule By Mouth Once A Day 13)  Glucometer of Choice .... Check Blood Sugar Daily  Dx 250.00 14)  True Track Glucometer Test Strips .... Check Blood Sugar Once A Day  Allergies: 1)  ! Codeine  Past History:  Past medical, surgical, family and social histories (including risk factors)  reviewed, and no changes noted (except as noted below).  Past Medical History: Reviewed history from 05/31/2007 and no changes required. Hyperlipidemia Hypertension Urinary incontinence Allergic rhinitis GERD Osteoarthritis  Past Surgical History: Reviewed history from 05/31/2007 and no changes required. Echo Stress Test DEXA (-) 2007 Cholecystectomy 1990's TA Hysterectomy 1993  Family History: Reviewed history from 05/31/2007 and no changes required. Father: Died 20 MI Mother: Died 25, dementia, CVA Siblings: 1 sister, ovarian CA, age 68 DM:  Maternal side Ovarian CA:  Sister, grandmother, great aunt  Social History: Reviewed history from 05/31/2007 and no changes required. Never Smoked Alcohol use-no Drug use-no Regular exercise-yes, Curves 2x/week Marital Status: Married x 48 years Children: None Occupation: Scientist, product/process development, works part-time as Asst. to [employer] Diet:  Fruit and veggies, no FF, some water, artificial sweetener  Review of Systems General:  Denies fatigue and fever. CV:  Denies chest pain or discomfort. Resp:  Denies shortness of breath. GI:  Denies abdominal pain.  Physical Exam  General:  overweight female in NAD Mouth:  MMM Neck:  no carotid bruit or thyromegaly no cervical or supraclavicular lymphadenopathy  Lungs:  Normal respiratory effort, chest expands symmetrically. Lungs are clear to auscultation, no crackles or wheezes. Heart:  Normal rate and regular rhythm. S1 and S2 normal without gallop, murmur, click, rub or other extra sounds. Abdomen:  Bowel sounds positive,abdomen soft and non-tender without masses, organomegaly or hernias noted. Pulses:  R and L posterior tibial pulses are full and equal bilaterally  Extremities:  no edema, B varicosities  Diabetes Management Exam:    Foot Exam (with socks and/or shoes not present):       Sensory-Pinprick/Light touch:          Left medial foot (L-4): normal          Left dorsal foot  (L-5): normal          Left lateral foot (S-1): normal          Right medial foot (L-4): normal          Right dorsal foot (L-5): normal          Right lateral foot (S-1): normal       Sensory-Monofilament:          Left foot: normal          Right foot: normal       Inspection:          Left foot: normal          Right foot: normal       Nails:          Left foot: normal          Right foot: normal   Impression & Recommendations:  Problem # 1:  DIABETES MELLITUS, TYPE II (ICD-250.00) Well controlled with diet and lifestyle.  Her updated medication list for  this problem includes:    Adult Aspirin Ec Low Strength 81 Mg Tbec (Aspirin) .Marland Kitchen... Take 1 tablet by mouth once a day  Problem # 2:  HYPERTENSION (ICD-401.9) Continue current medication, well controlled.  Her updated medication list for this problem includes:    Amlodipine Besylate 5 Mg Tabs (Amlodipine besylate) .Marland Kitchen... Take 1 tablet by mouth once a day    Atenolol-chlorthalidone 50-25 Mg Tabs (Atenolol-chlorthalidone) .Marland Kitchen... 1 tablet by mouth once a day  Problem # 3:  HYPERLIPIDEMIA (ICD-272.4) Inadequate control. Increase simvasatin to 40 mg daily. Recheck fasting lipids, AST, ALT  in 3 months Dx 272.0    Her updated medication list for this problem includes:    Simvastatin 40 Mg Tabs (Simvastatin) .Marland Kitchen... Take 1 tablet by mouth once a day  Problem # 4:  HYPOKALEMIA (ICD-276.8) resolved on higher dose of potassium.   Problem # 5:  MAMMOGRAM, ABNORMAL, LEFT (ICD-793.80) Scheduled for sterotactic US guided biopsy.   Complete Medication List: 1)  Amlodipine Besylate 5 Mg Tabs (Amlodipine besylate) .... Take 1 tablet by mouth once a day 2)  Simvastatin 40 Mg Tabs (Simvastatin) .... Take 1 tablet by mouth once a day 3)  Omeprazole 20 Mg Tbec (Omeprazole) .... Take 1 tablet by mouth once a day 4)  Atenolol-chlorthalidone 50-25 Mg Tabs (Atenolol-chlorthalidone) .Marland Kitchen.. 1 tablet by mouth once a day 5)  Potassium Chloride Crys Cr 20  Meq Tbcr (Potassium chloride crys cr) .Marland Kitchen.. 1 tab/.2  tab  by mouth every other day 6)  Adult Aspirin Ec Low Strength 81 Mg Tbec (Aspirin) .... Take 1 tablet by mouth once a day 7)  Fish Oil Concentrate 1000 Mg Caps (Omega-3 fatty acids) .... Take 1 tablet by mouth once a day 8)  Bl Multiple Vitamins Tabs (Multiple vitamins-minerals) .... Take 1 tablet by mouth once a day 9)  Calcium 600/vitamin D 600-200 Mg-unit Tabs (Calcium carbonate-vitamin d) .... Take 1 tablet by mouth two times a day 10)  Bl Magnesium 250 Mg Tabs (Magnesium) .... Take 1 tablet by mouth once a day 11)  Glucosamine-chondroitin 250-200 Mg Caps (Glucosamine-chondroitin) .... Take 1 tablet by mouth once a day 12)  Co Q-10 120 Mg Caps (Coenzyme q10) .... Take 1 capsule by mouth once a day 13)  Glucometer of Choice  .... Check blood sugar daily  dx 250.00 14)  True Track Glucometer Test Strips  .... Check blood sugar once a day  Hypertension Assessment/Plan:      The patient's hypertensive risk group is category C: Target organ damage and/or diabetes.  Her calculated 10 year risk of coronary heart disease is 8 %.  Today's blood pressure is 108/64.  Her blood pressure goal is < 130/80.  Lipid Assessment/Plan:      Based on NCEP/ATP III, the patient's risk factor category is "history of diabetes".  The patient's lipid goals are as follows: Total cholesterol goal is 200; LDL cholesterol goal is 100; HDL cholesterol goal is 40; Triglyceride goal is 150.  Her LDL cholesterol goal has not been met.  Secondary causes for hyperlipidemia have been ruled out.  She has been counseled on adjunctive measures for lowering her cholesterol and has been provided with dietary instructions.    Patient Instructions: 1)  Increase simvastatin to 40 mg daily.  2)  Recheck fasting lipids, AST, ALT  in 3 months Dx 272.0    3)  Please schedule a follow-up appointment in 6 months. 4)  BMP prior to visit, ICD-9: 250.0 5)  Hepatic  Panel prior to visit  ICD-9:  6)  Lipid panel prior to visit ICD-9 :  7)  HgBA1c prior to visit  ICD-9:  Prescriptions: SIMVASTATIN 40 MG TABS (SIMVASTATIN) Take 1 tablet by mouth once a day  #90 x 3   Entered and Authorized by:   Kerby Nora MD   Signed by:   Kerby Nora MD on 05/11/2009   Method used:   Electronically to        [facility]* (retail)       [address] [place], [place]  [address]       Ph: [phone] or [phone]       Fax: [phone]   RxID:   [id number]   Current Allergies (reviewed today): ! CODEINE

## 2010-06-14 NOTE — Progress Notes (Signed)
Summary: eye problems  Phone Note Call from Patient Call back at [phone]   Caller: Patient Call For: Demarrion Meiklejohn Summary of Call: pt says over weekend she started seeing little flashes of light on her r eye, pt says it has gotten some better, she says years ago she had an ulcer amd sty on it years ago, pt not sure if she shoul see you or eye sr b/c seh also has sinus problems now, she c/o sinus pressure on r side of face. Initial call taken by: Liane Comber,  April 01, 2007 1:09 PM  Follow-up for Phone Call        eye doc today ASAP Follow-up by: Kerby Nora MD,  April 01, 2007 1:22 PM         Appended Document: eye problems Patient Advised.

## 2010-06-14 NOTE — Miscellaneous (Signed)
  Clinical Lists Changes  Observations: Added new observation of COLONOSCOPY: Diverticulosis (02/01/2005 15:11)       Preventive Care Screening  Colonoscopy:    Date:  02/01/2005    Results:  Diverticulosis

## 2010-06-14 NOTE — Assessment & Plan Note (Signed)
Summary: BLOOD PRESSURE UP- CDAVIS   Vital Signs:  Patient profile:   75 year old female Height:      62.75 inches Weight:      163.0 pounds BMI:     29.21 Temp:     97.9 degrees F oral Pulse rate:   80 / minute Pulse rhythm:   regular BP sitting:   120 / 72  (left arm) Cuff size:   regular  Vitals Entered By: Benny Lennert CMA Duncan Dull) (January 14, 2010 9:48 AM)   History of Present Illness: Chief complaint Blood pressure up  HTN.Marland Kitchenin past few weeks new meter..had been getting elevated BPS running 145-159/87 On metoprolol and diltiazem.  Here on her cuff she broaught from home 147/76...our showed 120/70.   Continues with palpitations..bothering her a lot. Has had eval with Br Gollan..last seen 07/2009. Keeps her up at night.  At home HR occ in 50s.  Some hot flashes. Insomnia. Nml TSH in 05/2009.  Problems Prior to Update: 1)  Palpitations  (ICD-785.1) 2)  Mammogram, Abnormal, Left  (ICD-793.80) 3)  Constipation  (ICD-564.00) 4)  Obesity  (ICD-278.00) 5)  Osteoarthritis  (ICD-715.90) 6)  Gerd  (ICD-530.81) 7)  Allergic Rhinitis  (ICD-477.9) 8)  Diabetes Mellitus, Type II  (ICD-250.00) 9)  Special Screening For Osteoporosis  (ICD-V82.81) 10)  Neoplasm, Skin, Uncertain Behavior  (ICD-238.2) 11)  Urinary Incontinence  (ICD-788.30) 12)  Hypertension  (ICD-401.9) 13)  Hyperlipidemia  (ICD-272.4) 14)  Actinic Keratosis  (ICD-702.0) 15)  Seborrheic Keratosis  (ICD-702.19)  Current Medications (verified): 1)  Diltiazem Hcl Cr 120 Mg Xr12h-Cap (Diltiazem Hcl) .Marland Kitchen.. 1 Tab By Mouth Daily 2)  Simvastatin 40 Mg Tabs (Simvastatin) .... Take 1 Tablet By Mouth Once A Day 3)  Omeprazole 20 Mg Tbec (Omeprazole) .... Take 1 Tablet By Mouth Once A Day 4)  Metoprolol Succinate 50 Mg Xr24h-Tab (Metoprolol Succinate) .Marland Kitchen.. 1 Tab By Mouth Daily 5)  Otc Potassium .Marland Kitchen.. 1 By Mouth Once Daily 6)  Adult Aspirin Ec Low Strength 81 Mg  Tbec (Aspirin) .... Take 1 Tablet By Mouth Once A Day 7)   Fish Oil Concentrate 1000 Mg  Caps (Omega-3 Fatty Acids) .... Take 1 Tablet By Mouth Once A Day 8)  Bl Multiple Vitamins   Tabs (Multiple Vitamins-Minerals) .... Take 1 Tablet By Mouth Once A Day 9)  Calcium 600/vitamin D 600-200 Mg-Unit  Tabs (Calcium Carbonate-Vitamin D) .... Take 1 Tablet By Mouth Daily 10)  Bl Magnesium 250 Mg  Tabs (Magnesium) .... Take 1 Tablet By Mouth Once A Day 11)  Co Q-10 120 Mg  Caps (Coenzyme Q10) .... Take 1 Capsule By Mouth Once A Day 12)  Glucometer of Choice .... Check Blood Sugar Daily  Dx 250.00 13)  True Track Glucometer Test Strips .... Check Blood Sugar Once A Day  Allergies: 1)  ! Codeine  Past History:  Past medical, surgical, family and social histories (including risk factors) reviewed, and no changes noted (except as noted below).  Past Medical History: Reviewed history from 05/31/2007 and no changes required. Hyperlipidemia Hypertension Urinary incontinence Allergic rhinitis GERD Osteoarthritis  Past Surgical History: Reviewed history from 05/28/2009 and no changes required. Echo Stress Test ([facility]. [place]:  Cardiolite in 2004.  Normal) DEXA (-) 2007 Cholecystectomy 1990's TA Hysterectomy 1993  Family History: Reviewed history from 05/31/2007 and no changes required. Father: Died 40 MI Mother: Died 41, dementia, CVA Siblings: 1 sister, ovarian CA, age 66 DM:  Maternal side Ovarian CA:  Sister, grandmother,  great aunt  Social History: Reviewed history from 05/31/2007 and no changes required. 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  General:  Denies fatigue and fever. CV:  Denies chest pain or discomfort. Resp:  Denies shortness of breath. GI:  Denies abdominal  pain. GU:  Denies dysuria.  Physical Exam  General:  Well-developed,well-nourished,in no acute distress; alert,appropriate and cooperative throughout examination Mouth:  MMM Neck:  No deformities, masses, or tenderness noted. Lungs:  Normal respiratory effort, chest expands symmetrically. Lungs are clear to auscultation, no crackles or wheezes. Heart:  Normal rate and regular rhythm. S1 and S2 normal without gallop, murmur, click, rub or other extra sounds. Abdomen:  Bowel sounds positive,abdomen soft and non-tender without masses, organomegaly or hernias noted. Pulses:  R and L posterior tibial pulses are full and equal bilaterally  Extremities:  No clubbing, cyanosis, edema, or deformity noted with normal full range of motion of all joints.   Psych:  Cognition and judgment appear intact. Alert and cooperative with normal attention span and concentration. No apparent delusions, illusions, hallucinations   Impression & Recommendations:  Problem # 1:  PALPITATIONS (ICD-785.1) Pt has not been using as needed additional 25 mg of metoprol. Encouraged her to use this for symtpoms as long as heart rate >70 when taken.  Her updated medication list for this problem includes:    Metoprolol Succinate 50 Mg Xr24h-tab (Metoprolol succinate) .Marland Kitchen... 1 tab by mouth daily  Problem # 2:  HYPERTENSION (ICD-401.9) Home meter not accurate... Call to fix/replace. Contnue current meds. Continue to follow.  Her updated medication list for this problem includes:    Diltiazem Hcl Cr 120 Mg Xr12h-cap (Diltiazem hcl) .Marland Kitchen... 1 tab by mouth daily    Metoprolol Succinate 50 Mg Xr24h-tab (Metoprolol succinate) .Marland Kitchen... 1 tab by mouth daily  Problem # 3:  INSOMNIA, CHRONIC (ICD-307.42) Recommended sleep hygeine/reviewed in detail.  Start exercsie in AMs. May use melatonin for sleep if not improving.   Problem # 4:  HOT FLASHES (ICD-627.2) Doubt hormonal fluctuation at this age.  Nml ATSH. Other wise feeling well.     Complete Medication List: 1)  Diltiazem Hcl Cr 120 Mg Xr12h-cap (Diltiazem hcl) .Marland Kitchen.. 1 tab by mouth daily 2)  Simvastatin 40 Mg Tabs (Simvastatin) .... Take 1 tablet by mouth once a day 3)  Omeprazole 20 Mg Tbec (Omeprazole) .... Take 1 tablet by mouth once a day 4)  Metoprolol Succinate 50 Mg Xr24h-tab (Metoprolol succinate) .Marland Kitchen.. 1 tab by mouth daily 5)  Otc Potassium  .Marland KitchenMarland Kitchen. 1 by mouth once daily 6)  Adult Aspirin Ec Low Strength 81 Mg Tbec (Aspirin) .... Take 1 tablet by mouth once a day 7)  Fish Oil Concentrate 1000 Mg Caps (Omega-3 fatty acids) .... Take 1 tablet by mouth once a day 8)  Bl Multiple Vitamins Tabs (Multiple vitamins-minerals) .... Take 1 tablet by mouth once a day 9)  Calcium 600/vitamin D 600-200 Mg-unit Tabs (Calcium carbonate-vitamin d) .... Take 1 tablet by mouth daily 10)  Bl Magnesium 250 Mg Tabs (Magnesium) .... Take 1 tablet by mouth once a day 11)  Co Q-10 120 Mg Caps (Coenzyme q10) .... Take 1 capsule by mouth once a day 12)  Glucometer of Choice  .Marland KitchenMarland KitchenMarland Kitchen  Check blood sugar daily  dx 250.00 13)  True Track Glucometer Test Strips  .... Check blood sugar once a day  Patient Instructions: 1)  Call company about meter. 2)  If palpitations..check pulse...as long as above 70..take additional metoprol 25 mg as needed. 3)  If symptoms not improving .Marland Kitchen 4)   Dr. Mariah Milling about other heart rate controlling medicines. 5)  Can try melatonin for insomnia.  6)   Increase exercsie. Work on  sleep hygeine. 7)  Consider trying melatonin for sleep  if not improving. 8)  Can use Eucerin/Cetaphil cream for dry skin.  Current Allergies (reviewed today): ! CODEINE

## 2010-06-14 NOTE — Miscellaneous (Signed)
Summary: med list update/ refill test strips  Clinical Lists Changes  Medications: Added new medication of * TRUE TRACK GLUCOMETER TEST STRIPS check blood sugar once a day - Signed Rx of TRUE TRACK GLUCOMETER TEST STRIPS check blood sugar once a day;  #100 x prn;  Signed;  Entered by: Lowella Petties CMA;  Authorized by: Kerby Nora MD;  Method used: Telephoned to [facility]*, [address], [place], [place]  [address], Ph: [phone] or [phone], Fax: [phone]    Prescriptions: TRUE TRACK GLUCOMETER TEST STRIPS check blood sugar once a day  #100 x prn   Entered by:   Lowella Petties CMA   Authorized by:   Kerby Nora MD   Signed by:   Lowella Petties CMA on 12/08/2008   Method used:   Telephoned to ...       [facility]* (retail)       [address] [place], [place]  [address]       Ph: [phone] or [phone]       Fax: [phone]   RxID:   [id number]   Prior Medications: AMLODIPINE BESYLATE 5 MG TABS (AMLODIPINE BESYLATE) Take 1 tablet by mouth once a day SIMVASTATIN 20 MG TABS (SIMVASTATIN) Take 1 tablet by mouth every night PRILOSEC 20 MG CPDR (OMEPRAZOLE) Take 1 capsule by mouth once a day ATENOLOL-CHLORTHALIDONE 50-25 MG TABS (ATENOLOL-CHLORTHALIDONE) 1 tablet by mouth once a day POTASSIUM CHLORIDE CRYS CR 20 MEQ TBCR (POTASSIUM CHLORIDE CRYS CR) 1 tab/.2  tab  by mouth every other day ADULT ASPIRIN EC LOW STRENGTH 81 MG  TBEC (ASPIRIN) Take 1 tablet by mouth once a day FISH OIL CONCENTRATE 1000 MG  CAPS (OMEGA-3 FATTY ACIDS) Take 1 tablet by mouth once a day BL MULTIPLE VITAMINS   TABS (MULTIPLE VITAMINS-MINERALS) Take 1 tablet by mouth once a day CALCIUM 600/VITAMIN D 600-200 MG-UNIT  TABS (CALCIUM CARBONATE-VITAMIN D) Take 1 tablet by mouth two times a day BL MAGNESIUM 250 MG  TABS (MAGNESIUM) Take 1 tablet by mouth once a day  GLUCOSAMINE-CHONDROITIN 250-200 MG  CAPS (GLUCOSAMINE-CHONDROITIN) Take 1 tablet by mouth once a day CO Q-10 120 MG  CAPS (COENZYME Q10) Take 1 capsule by mouth once a day GINKGO 60 MG TABS (GINKGO BILOBA) Take 1 tablet by mouth once a day GLUCOMETER OF CHOICE () Check Blood sugar daily  Dx 250.00 Current Allergies: ! CODEINE

## 2010-06-14 NOTE — Assessment & Plan Note (Signed)
Summary: MONDAY 12/22 CELLULITIS /RBH   Vital Signs:  Patient Profile:   75 Years Old Female Height:     62.75 inches Weight:      181.13 pounds Temp:     98.1 degrees F oral Pulse rate:   72 / minute Pulse rhythm:   regular BP sitting:   118 / 68  (left arm) Cuff size:   regular  Vitals Entered By: Delilah Shan (May 06, 2007 3:44 PM)                 Chief Complaint:  Follow up - cellulitis.  History of Present Illness: Cellulitis, f/up no nausea/vomitng   Current Allergies (reviewed today): ! CODEINE      Physical Exam  Skin:     3 cm diameter brown/black lesion (nitrate sticks used to stop bleeding) with 6 cm surrounding erythema and warmth, small extension of redness medical to wounds, swelling    Impression & Recommendations:  Problem # 1:  CELLULITIS, LEG, RIGHT (ICD-682.6) stable on day 4 of antibiotics.  RAsh is very stable, so possibility of allergic reaction to iodine used in procedure is high. (She had redness and itching start fairly soon after procedure)  Complete antibiotics. May apply OTC steroid cream to red area. Her updated medication list for this problem includes:    Doxycycline Hyclate 100 Mg Caps (Doxycycline hyclate) .Marland Kitchen... Take 1 tablet by mouth every 12 hours x 10 days   Complete Medication List: 1)  Amlodipine Besylate 5 Mg Tabs (Amlodipine besylate) .... Take 1 tablet by mouth once a day 2)  Simvastatin 20 Mg Tabs (Simvastatin) .... Take 1 tablet by mouth every night 3)  Prilosec 20 Mg Cpdr (Omeprazole) .... Take 1 capsule by mouth once a day 4)  Atenolol-chlorthalidone 50-25 Mg Tabs (Atenolol-chlorthalidone) .Marland Kitchen.. 1 tablet by mouth once a day 5)  Potassium Chloride Crys Cr 20 Meq Tbcr (Potassium chloride crys cr) 6)  Adult Aspirin Ec Low Strength 81 Mg Tbec (Aspirin) .... Take 1 tablet by mouth once a day 7)  Fish Oil Concentrate 1000 Mg Caps (Omega-3 fatty acids) .... Take 1 tablet by mouth once a day 8)  Bl Multiple Vitamins  Tabs (Multiple vitamins-minerals) .... Take 1 tablet by mouth once a day 9)  Claritin 10 Mg Tabs (Loratadine) .... Take 1 tablet by mouth once a day as needed 10)  Zyrtec Allergy 10 Mg Tabs (Cetirizine hcl) .... Take 1 tablet by mouth once a day 11)  Calcium 600/vitamin D 600-200 Mg-unit Tabs (Calcium carbonate-vitamin d) .... Take 1 tablet by mouth two times a day 12)  Bl Magnesium 250 Mg Tabs (Magnesium) .... Take 1 tablet by mouth once a day 13)  Glucosamine-chondroitin 250-200 Mg Caps (Glucosamine-chondroitin) .... Take 1 tablet by mouth once a day 14)  Co Q-10 120 Mg Caps (Coenzyme q10) .... Take 1 capsule by mouth once a day 15)  Doxycycline Hyclate 100 Mg Caps (Doxycycline hyclate) .... Take 1 tablet by mouth every 12 hours x 10 days  Other Orders: No Charge Patient Arrived (NCPA0) (NCPA0)     ] Current Allergies (reviewed today): ! CODEINE Current Medications (including changes made in today's visit):  AMLODIPINE BESYLATE 5 MG TABS (AMLODIPINE BESYLATE) Take 1 tablet by mouth once a day SIMVASTATIN 20 MG TABS (SIMVASTATIN) Take 1 tablet by mouth every night PRILOSEC 20 MG CPDR (OMEPRAZOLE) Take 1 capsule by mouth once a day ATENOLOL-CHLORTHALIDONE 50-25 MG TABS (ATENOLOL-CHLORTHALIDONE) 1 tablet by mouth once a day POTASSIUM CHLORIDE  CRYS CR 20 MEQ TBCR (POTASSIUM CHLORIDE CRYS CR)  ADULT ASPIRIN EC LOW STRENGTH 81 MG  TBEC (ASPIRIN) Take 1 tablet by mouth once a day FISH OIL CONCENTRATE 1000 MG  CAPS (OMEGA-3 FATTY ACIDS) Take 1 tablet by mouth once a day BL MULTIPLE VITAMINS   TABS (MULTIPLE VITAMINS-MINERALS) Take 1 tablet by mouth once a day CLARITIN 10 MG  TABS (LORATADINE) Take 1 tablet by mouth once a day as needed ZYRTEC ALLERGY 10 MG  TABS (CETIRIZINE HCL) Take 1 tablet by mouth once a day CALCIUM 600/VITAMIN D 600-200 MG-UNIT  TABS (CALCIUM CARBONATE-VITAMIN D) Take 1 tablet by mouth two times a day BL MAGNESIUM 250 MG  TABS (MAGNESIUM) Take 1 tablet by mouth once a  day GLUCOSAMINE-CHONDROITIN 250-200 MG  CAPS (GLUCOSAMINE-CHONDROITIN) Take 1 tablet by mouth once a day CO Q-10 120 MG  CAPS (COENZYME Q10) Take 1 capsule by mouth once a day DOXYCYCLINE HYCLATE 100 MG  CAPS (DOXYCYCLINE HYCLATE) Take 1 tablet by mouth every 12 hours x 10 days

## 2010-06-14 NOTE — Progress Notes (Signed)
Summary: PROBLEMS  Phone Note Call from Patient Call back at Home Phone [phone]   Caller: SELF Call For: GOLLAN Summary of Call: PTS HEART BEAT IS RACING-WHAT CAN SHE DO TO CALM IT DOWN? Initial call taken by: Harlon Flor,  June 28, 2009 3:15 PM  Follow-up for Phone Call        instructed pt to take 1/2 tab of atenolol to see if that resolves increased HR.  Informed pt of vagal meanuver to be preformed when sitting if addtional atenolol does not work. Pt has appt tomorrow with Dr. Mariah Milling Follow-up by: Charlena Cross, RN, BSN,  June 28, 2009 4:27 PM

## 2010-06-14 NOTE — Assessment & Plan Note (Signed)
Summary: yeast infection/alc   Vital Signs:  Patient profile:   75 year old female Height:      62.75 inches Weight:      162 pounds BMI:     29.03 Temp:     98.2 degrees F oral Pulse rate:   72 / minute Pulse rhythm:   regular BP sitting:   140 / 90  (right arm) Cuff size:   regular  Vitals Entered By: Linde Gillis CMA Duncan Dull) (March 10, 2010 10:40 AM) CC: ? yeast infection   History of Present Illness: 75 yo here for vulvular irritation.  Started over one month ago but much worse over this past week. Vulva feels very irritated, "raw."  Some itching, mild amount of thick discharge. No dysuria, fever or back pain.  Of note, treated with Avelox for PNA last week.    Current Medications (verified): 1)  Diltiazem Hcl Cr 120 Mg Xr12h-Cap (Diltiazem Hcl) .Marland Kitchen.. 1 Tab By Mouth Daily 2)  Simvastatin 40 Mg Tabs (Simvastatin) .... Take 1 Tablet By Mouth Once A Day 3)  Omeprazole 20 Mg Tbec (Omeprazole) .... Take 1 Tablet By Mouth Once A Day 4)  Metoprolol Succinate 50 Mg Xr24h-Tab (Metoprolol Succinate) .Marland Kitchen.. 1 Tab By Mouth Daily 5)  Otc Potassium .Marland Kitchen.. 1 By Mouth Once Daily 6)  Adult Aspirin Ec Low Strength 81 Mg  Tbec (Aspirin) .... Take 1 Tablet By Mouth Once A Day 7)  Fish Oil Concentrate 1000 Mg  Caps (Omega-3 Fatty Acids) .... Take 1 Tablet By Mouth Once A Day 8)  Bl Multiple Vitamins   Tabs (Multiple Vitamins-Minerals) .... Take 1 Tablet By Mouth Once A Day 9)  Calcium 600/vitamin D 600-200 Mg-Unit  Tabs (Calcium Carbonate-Vitamin D) .... Take 1 Tablet By Mouth Daily 10)  Bl Magnesium 250 Mg  Tabs (Magnesium) .... Take 1 Tablet By Mouth Once A Day 11)  Co Q-10 120 Mg  Caps (Coenzyme Q10) .... Take 1 Capsule By Mouth Once A Day 12)  Glucometer of Choice .... Check Blood Sugar Daily  Dx 250.00 13)  True Track Glucometer Test Strips .... Check Blood Sugar Once A Day 14)  Diflucan 150 Mg Tabs (Fluconazole) .... Once Daily 15)  Clobetasol Propionate 0.05 % Crea (Clobetasol  Propionate) .... Apply To Vaginal Area Daily  Allergies: 1)  ! Codeine  Past History:  Past Medical History: Last updated: 06-09-2007 Hyperlipidemia Hypertension Urinary incontinence Allergic rhinitis GERD Osteoarthritis  Past Surgical History: Last updated: 05/28/2009 Echo Stress Test Rexene Edison. Smith:  Cardiolite in 2004.  Normal) DEXA (-) 2007 Cholecystectomy 1990's TA Hysterectomy 1993  Family History: Last updated: 06-09-07 Father: Died 29 MI Mother: Died 67, dementia, CVA Siblings: 1 sister, ovarian CA, age 5 DM:  Maternal side Ovarian CA:  Sister, grandmother, great aunt  Social History: Last updated: 09-Jun-2007 Never Smoked Alcohol use-no Drug use-no Regular exercise-yes, Curves 2x/week Marital Status: Married x 48 years Children: None Occupation: Scientist, product/process development, works part-time as Asst. to [employer] Diet:  Fruit and veggies, no FF, some water, artificial sweetener  Risk Factors: Alcohol Use: <1 (05/28/2009) Caffeine Use: 1-2  cup a day (05/28/2009) Exercise: no  (05/28/2009)  Risk Factors: Smoking Status: never (05/28/2009)  Review of Systems      See HPI General:  Denies fever and malaise. GI:  Denies abdominal pain and change in bowel habits. GU:  Complains of discharge; denies decreased libido, hematuria, urinary frequency, and urinary hesitancy.  Physical Exam  General:  Well-developed,well-nourished,in no acute distress; alert,appropriate  and cooperative throughout examination Genitalia:  erythematous plaques on entire vulva with satellite lesions to inner thigh.  no vaginal discharge and no vaginal or cervical lesions.   Psych:  Cognition and judgment appear intact. Alert and cooperative with normal attention span and concentration. No apparent delusions, illusions, hallucinations   Impression & Recommendations:  Problem # 1:  VULVITIS (ICD-616.10) Assessment New Wet prep consistent with yeast infection. Since rash is so diffuse,  will treat with oral diflucan as well as topical clobetasol. Pt in agreement with plan. Orders: T-Wet Prep ([id number])  Complete Medication List: 1)  Diltiazem Hcl Cr 120 Mg Xr12h-cap (Diltiazem hcl) .Marland Kitchen.. 1 tab by mouth daily 2)  Simvastatin 40 Mg Tabs (Simvastatin) .... Take 1 tablet by mouth once a day 3)  Omeprazole 20 Mg Tbec (Omeprazole) .... Take 1 tablet by mouth once a day 4)  Metoprolol Succinate 50 Mg Xr24h-tab (Metoprolol succinate) .Marland Kitchen.. 1 tab by mouth daily 5)  Otc Potassium  .Marland KitchenMarland Kitchen. 1 by mouth once daily 6)  Adult Aspirin Ec Low Strength 81 Mg Tbec (Aspirin) .... Take 1 tablet by mouth once a day 7)  Fish Oil Concentrate 1000 Mg Caps (Omega-3 fatty acids) .... Take 1 tablet by mouth once a day 8)  Bl Multiple Vitamins Tabs (Multiple vitamins-minerals) .... Take 1 tablet by mouth once a day 9)  Calcium 600/vitamin D 600-200 Mg-unit Tabs (Calcium carbonate-vitamin d) .... Take 1 tablet by mouth daily 10)  Bl Magnesium 250 Mg Tabs (Magnesium) .... Take 1 tablet by mouth once a day 11)  Co Q-10 120 Mg Caps (Coenzyme q10) .... Take 1 capsule by mouth once a day 12)  Glucometer of Choice  .... Check blood sugar daily  dx 250.00 13)  True Track Glucometer Test Strips  .... Check blood sugar once a day 14)  Diflucan 150 Mg Tabs (Fluconazole) .... Once daily 15)  Clobetasol Propionate 0.05 % Crea (Clobetasol propionate) .... Apply to vaginal area daily Prescriptions: CLOBETASOL PROPIONATE 0.05 % CREA (CLOBETASOL PROPIONATE) Apply to vaginal area daily  #30 g x 0   Entered and Authorized by:   Ruthe Mannan MD   Signed by:   Ruthe Mannan MD on 03/10/2010   Method used:   Electronically to        [facility]* (retail)       [address] [place], [place]  [address]       Ph: [phone] or [phone]       Fax: [phone]   RxID:   [id number] DIFLUCAN 150 MG TABS (FLUCONAZOLE) once daily  #1 x 0   Entered and  Authorized by:   Ruthe Mannan MD   Signed by:   Ruthe Mannan MD on 03/10/2010   Method used:   Electronically to        [facility]* (retail)       [address] [place], [place]  [address]       Ph: [phone] or [phone]       Fax: [phone]   RxID:   [id number]    Orders Added: 1)  T-Wet Prep [[id number]] 2)  Est. Patient Level IV [26948]    Current Allergies (reviewed today): ! CODEINE  Laboratory Results    Wet Mount/KOH Source: vaginal WBC/hpf  1-5 Bacteria/hpf rare Clue cells/hpf none  Negative whiff Yeast/hpf moderate Trichomonas/hpf none

## 2010-06-14 NOTE — Letter (Signed)
Summary: Health Screening  Health Screening   Imported By: Lanelle Bal 11/19/2009 11:32:32  _____________________________________________________________________  External Attachment:    Type:   Image     Comment:   External Document

## 2010-06-14 NOTE — Assessment & Plan Note (Signed)
Summary: follow up/R/S FROM 11/05/09   Vital Signs:  Patient profile:   75 year old female Height:      62.75 inches Weight:      169.2 pounds BMI:     30.32 Temp:     98.0 degrees F oral Pulse rate:   80 / minute Pulse rhythm:   regular BP sitting:   120 / 78  (left arm) Cuff size:   regular  Vitals Entered By: Benny Lennert CMA Duncan Dull) (November 12, 2009 12:06 PM)  History of Present Illness: Chief complaint followup rescheduled from 11-05-2009   In past 6 months...palpitations, seeing Dr. Mariah Milling...no arrythmia seen on event monitor.  Changed to BBlocker.  Stopped diuretic.  Continues to have palpitations but less frequently.  High cholesterol, well controlled on simvastatin.  DM, well controlled on diet.  HTN, well controlled.  Working on exercisie and weight loss. 2 lb weight loss.  Starting [facility] weight program.    Diabetes Management History:      She says that she is exercising.    Problems Prior to Update: 1)  Palpitations  (ICD-785.1) 2)  Hypokalemia  (ICD-276.8) 3)  Mammogram, Abnormal, Left  (ICD-793.80) 4)  Other Malaise and Fatigue  (ICD-780.79) 5)  Uti  (ICD-599.0) 6)  Constipation  (ICD-564.00) 7)  Nausea  (ICD-787.02) 8)  Obesity  (ICD-278.00) 9)  Osteoarthritis  (ICD-715.90) 10)  Gerd  (ICD-530.81) 11)  Allergic Rhinitis  (ICD-477.9) 12)  Diabetes Mellitus, Type II  (ICD-250.00) 13)  Cellulitis, Leg, Right  (ICD-682.6) 14)  Special Screening For Osteoporosis  (ICD-V82.81) 15)  Neoplasm, Skin, Uncertain Behavior  (ICD-238.2) 16)  Urinary Incontinence  (ICD-788.30) 17)  Hypertension  (ICD-401.9) 18)  Hyperlipidemia  (ICD-272.4) 19)  Verruca Vulgaris  (ICD-078.10) 20)  Actinic Keratosis  (ICD-702.0) 21)  Seborrheic Keratosis  (ICD-702.19)  Current Medications (verified): 1)  Diltiazem Hcl Cr 120 Mg Xr12h-Cap (Diltiazem Hcl) .Marland Kitchen.. 1 Tab By Mouth Daily 2)  Simvastatin 40 Mg Tabs (Simvastatin) .... Take 1 Tablet By Mouth Once A Day 3)   Omeprazole 20 Mg Tbec (Omeprazole) .... Take 1 Tablet By Mouth Once A Day 4)  Metoprolol Succinate 50 Mg Xr24h-Tab (Metoprolol Succinate) .Marland Kitchen.. 1 Tab By Mouth Daily 5)  Otc Potassium .Marland Kitchen.. 1 By Mouth Once Daily 6)  Adult Aspirin Ec Low Strength 81 Mg  Tbec (Aspirin) .... Take 1 Tablet By Mouth Once A Day 7)  Fish Oil Concentrate 1000 Mg  Caps (Omega-3 Fatty Acids) .... Take 1 Tablet By Mouth Once A Day 8)  Bl Multiple Vitamins   Tabs (Multiple Vitamins-Minerals) .... Take 1 Tablet By Mouth Once A Day 9)  Calcium 600/vitamin D 600-200 Mg-Unit  Tabs (Calcium Carbonate-Vitamin D) .... Take 1 Tablet By Mouth Daily 10)  Bl Magnesium 250 Mg  Tabs (Magnesium) .... Take 1 Tablet By Mouth Once A Day 11)  Co Q-10 120 Mg  Caps (Coenzyme Q10) .... Take 1 Capsule By Mouth Once A Day 12)  Glucometer of Choice .... Check Blood Sugar Daily  Dx 250.00 13)  True Track Glucometer Test Strips .... Check Blood Sugar Once A Day  Allergies: 1)  ! Codeine  Past History:  Past medical, surgical, family and social histories (including risk factors) reviewed, and no changes noted (except as noted below).  Past Medical History: Reviewed history from 05/31/2007 and no changes required. Hyperlipidemia Hypertension Urinary incontinence Allergic rhinitis GERD Osteoarthritis  Past Surgical History: Reviewed history from 05/28/2009 and no changes required. Echo Stress Test (H.  Smith:  Cardiolite in 2004.  Normal) DEXA (-) 2007 Cholecystectomy 1990's TA Hysterectomy 1993  Family History: Reviewed history from 05/31/2007 and no changes required. Father: Died 73 MI Mother: Died 63, dementia, CVA Siblings: 1 sister, ovarian CA, age 55 DM:  Maternal side Ovarian CA:  Sister, grandmother, great aunt  Social History: Reviewed history from 05/31/2007 and no changes required. 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 CV:  Denies chest pain or discomfort. Resp:  Denies shortness of breath. GI:  Denies abdominal pain, bloody stools, constipation, and diarrhea. GU:  Denies dysuria. Derm:  Denies rash. Psych:  Denies anxiety and depression.  Physical Exam  General:  Well-developed,well-nourished,in no acute distress; alert,appropriate and cooperative throughout examination Mouth:  MMM Neck:  No deformities, masses, or tenderness noted. Lungs:  Normal respiratory effort, chest expands symmetrically. Lungs are clear to auscultation, no crackles or wheezes. Heart:  Normal rate and regular rhythm. S1 and S2 normal without gallop, murmur, click, rub or other extra sounds. Abdomen:  Bowel sounds positive,abdomen soft and non-tender without masses, organomegaly or hernias noted. Pulses:  R and L posterior tibial pulses are full and equal bilaterally  Extremities:  No clubbing, cyanosis, edema, or deformity noted with normal full range of motion of all joints.   Skin:  multiple SK, couple on AKs on heands and face...has appt with derm.   Diabetes Management Exam:    Foot Exam (with socks and/or shoes not present):       Sensory-Pinprick/Light touch:          Left medial foot (L-4): normal          Left dorsal foot (L-5): normal          Left lateral foot (S-1): normal          Right medial foot (L-4): normal          Right dorsal foot (L-5): normal          Right lateral foot (S-1): normal       Sensory-Monofilament:          Left foot: normal          Right foot: normal       Inspection:          Left foot: normal          Right foot: normal       Nails:          Left foot: normal          Right foot: normal   Impression & Recommendations:  Problem # 1:  PALPITATIONS (ICD-785.1) Stable on  metoprolol..using 1/2 tab daily...hard for her to cut...refill at 50 mg dose.  Her updated medication list for this problem includes:    Metoprolol Succinate 50 Mg Xr24h-tab (Metoprolol succinate) .Marland Kitchen... 1 tab by mouth daily  Problem # 2:  DIABETES MELLITUS, TYPE II (ICD-250.00) Well controlled.  Her updated medication list for this problem includes:    Adult Aspirin Ec Low Strength 81 Mg Tbec (Aspirin) .Marland Kitchen... Take 1 tablet by mouth once a day  Problem # 3:  HYPERTENSION (ICD-401.9) Well controlled. Continue current medication.  Her updated medication list for this problem includes:    Diltiazem Hcl Cr 120  Mg Xr12h-cap (Diltiazem hcl) .Marland Kitchen... 1 tab by mouth daily    Metoprolol Succinate 50 Mg Xr24h-tab (Metoprolol succinate) .Marland Kitchen... 1 tab by mouth daily  Problem # 4:  HYPERLIPIDEMIA (ICD-272.4) Well controlled. Continue current medication.  Her updated medication list for this problem includes:    Simvastatin 40 Mg Tabs (Simvastatin) .Marland Kitchen... Take 1 tablet by mouth once a day  Complete Medication List: 1)  Diltiazem Hcl Cr 120 Mg Xr12h-cap (Diltiazem hcl) .Marland Kitchen.. 1 tab by mouth daily 2)  Simvastatin 40 Mg Tabs (Simvastatin) .... Take 1 tablet by mouth once a day 3)  Omeprazole 20 Mg Tbec (Omeprazole) .... Take 1 tablet by mouth once a day 4)  Metoprolol Succinate 50 Mg Xr24h-tab (Metoprolol succinate) .Marland Kitchen.. 1 tab by mouth daily 5)  Otc Potassium  .Marland KitchenMarland Kitchen. 1 by mouth once daily 6)  Adult Aspirin Ec Low Strength 81 Mg Tbec (Aspirin) .... Take 1 tablet by mouth once a day 7)  Fish Oil Concentrate 1000 Mg Caps (Omega-3 fatty acids) .... Take 1 tablet by mouth once a day 8)  Bl Multiple Vitamins Tabs (Multiple vitamins-minerals) .... Take 1 tablet by mouth once a day 9)  Calcium 600/vitamin D 600-200 Mg-unit Tabs (Calcium carbonate-vitamin d) .... Take 1 tablet by mouth daily 10)  Bl Magnesium 250 Mg Tabs (Magnesium) .... Take 1 tablet by mouth once a day 11)  Co Q-10 120 Mg Caps (Coenzyme q10) .... Take 1  capsule by mouth once a day 12)  Glucometer of Choice  .... Check blood sugar daily  dx 250.00 13)  True Track Glucometer Test Strips  .... Check blood sugar once a day  Diabetes Management Assessment/Plan:      The following lipid goals have been established for the patient: Total cholesterol goal of 200; LDL cholesterol goal of 100; HDL cholesterol goal of 40; Triglyceride goal of 150.  Her blood pressure goal is < 130/80.    Patient Instructions: 1)  Please schedule a follow-up appointment in 6 months  CPX. 2)  BMP prior to visit, ICD-9: 250.00 3)  Hepatic Panel prior to visit ICD-9:  4)  HgBA1c prior to visit  ICD-9:  5)  Urine Microalbumin prior to visit ICD-9 :  Prescriptions: METOPROLOL SUCCINATE 50 MG XR24H-TAB (METOPROLOL SUCCINATE) 1 tab by mouth daily  #90 x 3   Entered and Authorized by:   Kerby Nora MD   Signed by:   Kerby Nora MD on 11/12/2009   Method used:   Electronically to        [facility]* (retail)       [address] [place], [place]  [address]       Ph: [phone] or [phone]       Fax: [phone]   RxID:   [id number] DILTIAZEM HCL CR 120 MG XR12H-CAP (DILTIAZEM HCL) 1 tab by mouth daily  #90 x 3   Entered and Authorized by:   Kerby Nora MD   Signed by:   Kerby Nora MD on 11/12/2009   Method used:   Electronically to        [facility]* (retail)       [address] [place], [place]  [address]       Ph: [phone] or [phone]  Fax: [phone]   RxID:   [id number]   Current Allergies (reviewed today): ! CODEINE

## 2010-06-14 NOTE — Op Note (Signed)
Summary: Stereotactic Biopsy with Clip marker placement/[facility]  Stereotactic Biopsy with Clip marker placement/[facility]   Imported By: Maryln Gottron 05/27/2009 15:37:59  _____________________________________________________________________  External Attachment:    Type:   Image     Comment:   External Document

## 2010-06-14 NOTE — Assessment & Plan Note (Signed)
Summary: WANT TO TAKE A LOOK AT LEG THAT DOCTOR REMOVED A GROWTH FROM .Marland KitchenMarland Kitchen   Vital Signs:  Patient Profile:   75 Years Old Female Height:     62.75 inches Weight:      181.13 pounds Temp:     98 degrees F oral Pulse rate:   64 / minute Pulse rhythm:   regular BP sitting:   146 / 86  (left arm) Cuff size:   regular  Vitals Entered By: Delilah Shan (May 30, 2007 3:53 PM)                 Chief Complaint:  Leg looks infected.Marland Kitchen  History of Present Illness: Healing lesion at shave biopsy site, shrinking, decrease erythema but over last 24 hours there has been a return spread of erythema and some pus discharge from edge of scar.  No pain No fever.  See previous 2 OV notes  Current Allergies (reviewed today): ! CODEINE  Past Medical History:    Reviewed history from 04/19/2007 and no changes required:       Hyperlipidemia       Hypertension       Urinary incontinence     Review of Systems      See HPI   Physical Exam  General:     Well-developed,well-nourished,in no acute distress; alert,appropriate and cooperative throughout examination Extremities:     No clubbing, cyanosis, edema, or deformity noted with normal full range of motion of all joints.   Skin:     2 cm diameter brown/black lesion (nitrate sticks used to stop bleeding) with 6 cm surrounding erythema and warmth, small extension of redness inferior to  wound, swelling pus discharge upon pressure at edge of necrotic tissue.    Impression & Recommendations:  Problem # 1:  CELLULITIS, LEG, RIGHT (ICD-682.6) Culture obtained from pus drainage at edge of scar. Scab not easily debrided with pressure.  Recommend antibiotics and warm compresses to debride scar. Follow up if not improving The following medications were removed from the medication list:    Doxycycline Hyclate 100 Mg Caps (Doxycycline hyclate) .Marland Kitchen... Take 1 tablet by mouth every 12 hours x 10 days  Her updated medication list for this  problem includes:    Doxycycline Hyclate 100 Mg Tabs (Doxycycline hyclate) .Marland Kitchen... Take 1 tablet by mouth every 12 hours x 10 days   Complete Medication List: 1)  Amlodipine Besylate 5 Mg Tabs (Amlodipine besylate) .... Take 1 tablet by mouth once a day 2)  Simvastatin 20 Mg Tabs (Simvastatin) .... Take 1 tablet by mouth every night 3)  Prilosec 20 Mg Cpdr (Omeprazole) .... Take 1 capsule by mouth once a day 4)  Atenolol-chlorthalidone 50-25 Mg Tabs (Atenolol-chlorthalidone) .Marland Kitchen.. 1 tablet by mouth once a day 5)  Potassium Chloride Crys Cr 20 Meq Tbcr (Potassium chloride crys cr) 6)  Adult Aspirin Ec Low Strength 81 Mg Tbec (Aspirin) .... Take 1 tablet by mouth once a day 7)  Fish Oil Concentrate 1000 Mg Caps (Omega-3 fatty acids) .... Take 1 tablet by mouth once a day 8)  Bl Multiple Vitamins Tabs (Multiple vitamins-minerals) .... Take 1 tablet by mouth once a day 9)  Claritin 10 Mg Tabs (Loratadine) .... Take 1 tablet by mouth once a day as needed 10)  Zyrtec Allergy 10 Mg Tabs (Cetirizine hcl) .... Take 1 tablet by mouth once a day 11)  Calcium 600/vitamin D 600-200 Mg-unit Tabs (Calcium carbonate-vitamin d) .... Take 1 tablet by mouth two times  a day 12)  Bl Magnesium 250 Mg Tabs (Magnesium) .... Take 1 tablet by mouth once a day 13)  Glucosamine-chondroitin 250-200 Mg Caps (Glucosamine-chondroitin) .... Take 1 tablet by mouth once a day 14)  Co Q-10 120 Mg Caps (Coenzyme q10) .... Take 1 capsule by mouth once a day 15)  Doxycycline Hyclate 100 Mg Tabs (Doxycycline hyclate) .... Take 1 tablet by mouth every 12 hours x 10 days     Prescriptions: DOXYCYCLINE HYCLATE 100 MG  TABS (DOXYCYCLINE HYCLATE) Take 1 tablet by mouth every 12 hours x 10 days  #20 x 0   Entered and Authorized by:   Kerby Nora MD   Signed by:   Kerby Nora MD on 05/30/2007   Method used:   Electronically sent to ...       [facility]*       [address] [place], [place]  [address]       Ph: [phone] or [phone]       Fax: [phone]   RxID:   [id number]  ] Current Allergies (reviewed today): ! CODEINE Current Medications (including changes made in today's visit):  AMLODIPINE BESYLATE 5 MG TABS (AMLODIPINE BESYLATE) Take 1 tablet by mouth once a day SIMVASTATIN 20 MG TABS (SIMVASTATIN) Take 1 tablet by mouth every night PRILOSEC 20 MG CPDR (OMEPRAZOLE) Take 1 capsule by mouth once a day ATENOLOL-CHLORTHALIDONE 50-25 MG TABS (ATENOLOL-CHLORTHALIDONE) 1 tablet by mouth once a day POTASSIUM CHLORIDE CRYS CR 20 MEQ TBCR (POTASSIUM CHLORIDE CRYS CR)  ADULT ASPIRIN EC LOW STRENGTH 81 MG  TBEC (ASPIRIN) Take 1 tablet by mouth once a day FISH OIL CONCENTRATE 1000 MG  CAPS (OMEGA-3 FATTY ACIDS) Take 1 tablet by mouth once a day BL MULTIPLE VITAMINS   TABS (MULTIPLE VITAMINS-MINERALS) Take 1 tablet by mouth once a day CLARITIN 10 MG  TABS (LORATADINE) Take 1 tablet by mouth once a day as needed ZYRTEC ALLERGY 10 MG  TABS (CETIRIZINE HCL) Take 1 tablet by mouth once a day CALCIUM 600/VITAMIN D 600-200 MG-UNIT  TABS (CALCIUM CARBONATE-VITAMIN D) Take 1 tablet by mouth two times a day BL MAGNESIUM 250 MG  TABS (MAGNESIUM) Take 1 tablet by mouth once a day GLUCOSAMINE-CHONDROITIN 250-200 MG  CAPS (GLUCOSAMINE-CHONDROITIN) Take 1 tablet by mouth once a day CO Q-10 120 MG  CAPS (COENZYME Q10) Take 1 capsule by mouth once a day DOXYCYCLINE HYCLATE 100 MG  TABS (DOXYCYCLINE HYCLATE) Take 1 tablet by mouth every 12 hours x 10 days   Appended Document: Orders Update    Clinical Lists Changes  Orders: Added new Test order of T-Culture, Wound [phone]) - Signed       Appended Document: Orders Update    Clinical Lists Changes  Orders: Added new Service order of Est. Patient Level III (34742) - Signed

## 2010-06-14 NOTE — Letter (Signed)
Summary: Results Follow up Letter  [place] [facility]  [address] [place], [place] [address]   Phone: [phone]  Fax: [phone]    05/14/2007 MRN: [id number]  [facility] [address] [place], [place]  [address]  Dear Ms. Simonetti,  The following are the results of your recent test(s):  Test         Result    Pap Smear:        Normal _____  Not Normal _____ Comments: ______________________________________________________ Cholesterol: LDL(Bad cholesterol):         Your goal is less than:         HDL (Good cholesterol):       Your goal is more than: Comments:  ______________________________________________________ Mammogram:        Normal _____  Not Normal _____ Comments:  ___________________________________________________________________ Hemoccult:        Normal _____  Not normal _______ Comments:    _____________________________________________________________________ Other Tests: bone density  normal    We routinely do not discuss normal results over the telephone.  If you desire a copy of the results, or you have any questions about this information we can discuss them at your next office visit.   Sincerely,  Dr. Ermalene Searing

## 2010-06-14 NOTE — Assessment & Plan Note (Signed)
Summary: CPX Janet Chapman   Vital Signs:  Patient profile:   75 year old female Height:      62.75 inches Weight:      177.50 pounds BMI:     31.81 BSA:     1.83 Temp:     98.5 degrees F oral Pulse rate:   62 / minute Pulse rhythm:   regular Resp:     16 per minute BP sitting:   120 / 80  (left arm) Cuff size:   regular  Vitals Entered By: Ardyth Man (August 04, 2008 11:10 AM) Is Patient Diabetic? No Pain Assessment Patient in pain? no       Have you ever been in a relationship where you felt threatened, hurt or afraid?No   Does patient need assistance? Functional Status Self care Ambulation Normal   History of Present Illness: 07/13/2008 Recent stomach virsus...improved with time.  Afterwards had some issues with constipation..treat self with enema. Still straining with BMs. Using correctol every few days Doing better now.  Some dry mouth no new medications  Hair thinning on top. INterested in trying minoxidil. Runs in family   5lb weight loss in past few months  Potassium low on recent labs...skipiing potassium supplement lately  Current Medications (verified): 1)  Amlodipine Besylate 5 Mg Tabs (Amlodipine Besylate) .... Take 1 Tablet By Mouth Once A Day 2)  Simvastatin 20 Mg Tabs (Simvastatin) .... Take 1 Tablet By Mouth Every Night 3)  Prilosec 20 Mg Cpdr (Omeprazole) .... Take 1 Capsule By Mouth Once A Day 4)  Atenolol-Chlorthalidone 50-25 Mg Tabs (Atenolol-Chlorthalidone) .Marland Kitchen.. 1 Tablet By Mouth Once A Day 5)  Potassium Chloride Crys Cr 20 Meq Tbcr (Potassium Chloride Crys Cr) 6)  Adult Aspirin Ec Low Strength 81 Mg  Tbec (Aspirin) .... Take 1 Tablet By Mouth Once A Day 7)  Fish Oil Concentrate 1000 Mg  Caps (Omega-3 Fatty Acids) .... Take 1 Tablet By Mouth Once A Day 8)  Bl Multiple Vitamins   Tabs (Multiple Vitamins-Minerals) .... Take 1 Tablet By Mouth Once A Day 9)  Calcium 600/vitamin D 600-200 Mg-Unit  Tabs (Calcium Carbonate-Vitamin D) .... Take 1  Tablet By Mouth Two Times A Day 10)  Bl Magnesium 250 Mg  Tabs (Magnesium) .... Take 1 Tablet By Mouth Once A Day 11)  Glucosamine-Chondroitin 250-200 Mg  Caps (Glucosamine-Chondroitin) .... Take 1 Tablet By Mouth Once A Day 12)  Co Q-10 120 Mg  Caps (Coenzyme Q10) .... Take 1 Capsule By Mouth Once A Day  Allergies (verified): 1)  ! Codeine  Past History:  Past medical, surgical, family and social histories (including risk factors) reviewed, and no changes noted (except as noted below).  Past Medical History:    Reviewed history from 05/31/2007 and no changes required:    Hyperlipidemia    Hypertension    Urinary incontinence    Allergic rhinitis    GERD    Osteoarthritis  Past Surgical History:    Reviewed history from 05/31/2007 and no changes required:    Echo    Stress Test    DEXA (-) 2007    Cholecystectomy 1990's    TA Hysterectomy 1993  Family History:    Reviewed history from 05/31/2007 and no changes required:       Father: Died 41 MI       Mother: Died 82, dementia, CVA       Siblings: 1 sister, ovarian CA, age 46       DM:  Maternal  side       Ovarian CA:  Sister, grandmother, great aunt  Social History:    Reviewed history from 05/31/2007 and no changes required:       Never Smoked       Alcohol use-no       Drug use-no       Regular exercise-yes, Curves 2x/week       Marital Status: Married x 48 years       Children: None       Occupation: Scientist, product/process development, works part-time as Asst. to [employer]       Diet:  Fruit and veggies, no FF, some water, artificial sweetener  Review of Systems General:  Denies fatigue and fever. CV:  Denies chest pain or discomfort. Resp:  Denies shortness of breath. GI:  Denies abdominal pain and bloody stools. GU:  Denies dysuria. Psych:  Denies anxiety and depression.  Physical Exam  General:  overweight female in NAD Ears:  External ear exam shows no significant lesions or deformities.  Otoscopic examination  reveals clear canals, tympanic membranes are intact bilaterally without bulging, retraction, inflammation or discharge. Hearing is grossly normal bilaterally. Nose:  External nasal examination shows no deformity or inflammation. Nasal mucosa are pink and moist without lesions or exudates. Mouth:  Oral mucosa and oropharynx without lesions or exudates.MMM Neck:  no carotid bruit or thyromegaly no cervical or supraclavicular lymphadenopathy  Chest Wall:  No deformities, masses, or tenderness noted. Breasts:  No mass, nodules, thickening, tenderness, bulging, retraction, inflamation, nipple discharge or skin changes noted.   Lungs:  Normal respiratory effort, chest expands symmetrically. Lungs are clear to auscultation, no crackles or wheezes. Heart:  Normal rate and regular rhythm. S1 and S2 normal without gallop, murmur, click, rub or other extra sounds. Abdomen:  Bowel sounds positive,abdomen soft and non-tender without masses, organomegaly or hernias noted. Genitalia:  not indicated Msk:  No deformity or scoliosis noted of thoracic or lumbar spine.   Pulses:  R and L posterior tibial pulses are full and equal bilaterally  Extremities:  no edema, B varicosities Skin:  multiple SK, couple on AKs on heands and face...has appt with derm.    Impression & Recommendations:  Problem # 1:  PREDIABETES (ICD-790.29)  Counseled on diet and exercise changes. Recehck in 3 months, check A1C.   Labs Reviewed: Creat: 0.7 (07/29/2008)     Problem # 2:  HYPERTENSION (ICD-401.9)  Well controlle don current medicaiton.  Her updated medication list for this problem includes:    Amlodipine Besylate 5 Mg Tabs (Amlodipine besylate) .Marland Kitchen... Take 1 tablet by mouth once a day    Atenolol-chlorthalidone 50-25 Mg Tabs (Atenolol-chlorthalidone) .Marland Kitchen... 1 tablet by mouth once a day  BP today: 120/80 Prior BP: 130/78 (09/30/2007)  Prior 10 Yr Risk Heart Disease: Not enough information (04/30/2007)  Labs Reviewed:  K+: 3.1 (07/29/2008) Creat: : 0.7 (07/29/2008)   Chol: 171 (07/29/2008)   HDL: 53.60 (07/29/2008)   LDL: 100 (07/29/2008)   TG: 85.0 (07/29/2008)  Problem # 3:  HYPERLIPIDEMIA (ICD-272.4) At goal on current medicaiton Her updated medication list for this problem includes:    Simvastatin 20 Mg Tabs (Simvastatin) .Marland Kitchen... Take 1 tablet by mouth every night  Labs Reviewed: SGOT: 18 (07/29/2008)   SGPT: 18 (07/29/2008)  Prior 10 Yr Risk Heart Disease: Not enough information (04/30/2007)   HDL:53.60 (07/29/2008), 76.2 (05/22/2007)  LDL:100 (07/29/2008), DEL (05/22/2007)  Chol:171 (07/29/2008), 228 (05/22/2007)  Trig:85.0 (07/29/2008), 61 (05/22/2007)  Problem # 4:  CONSTIPATION (ICD-564.00) Increase fluids, fiber. MAy use miralax if needed. If not improving, follow up. Check TSh at next labs chack.   Problem # 5:  Preventive Health Care (ICD-V70.0) Reviewed preventive care protocols, scheduled due services, and updated immunizations.   Complete Medication List: 1)  Amlodipine Besylate 5 Mg Tabs (Amlodipine besylate) .... Take 1 tablet by mouth once a day 2)  Simvastatin 20 Mg Tabs (Simvastatin) .... Take 1 tablet by mouth every night 3)  Prilosec 20 Mg Cpdr (Omeprazole) .... Take 1 capsule by mouth once a day 4)  Atenolol-chlorthalidone 50-25 Mg Tabs (Atenolol-chlorthalidone) .Marland Kitchen.. 1 tablet by mouth once a day 5)  Potassium Chloride Crys Cr 20 Meq Tbcr (Potassium chloride crys cr) 6)  Adult Aspirin Ec Low Strength 81 Mg Tbec (Aspirin) .... Take 1 tablet by mouth once a day 7)  Fish Oil Concentrate 1000 Mg Caps (Omega-3 fatty acids) .... Take 1 tablet by mouth once a day 8)  Bl Multiple Vitamins Tabs (Multiple vitamins-minerals) .... Take 1 tablet by mouth once a day 9)  Calcium 600/vitamin D 600-200 Mg-unit Tabs (Calcium carbonate-vitamin d) .... Take 1 tablet by mouth two times a day 10)  Bl Magnesium 250 Mg Tabs (Magnesium) .... Take 1 tablet by mouth once a day 11)  Glucosamine-chondroitin  250-200 Mg Caps (Glucosamine-chondroitin) .... Take 1 tablet by mouth once a day 12)  Co Q-10 120 Mg Caps (Coenzyme q10) .... Take 1 capsule by mouth once a day  Patient Instructions: 1)  BMP , A1C in 3 months, fasting 790.20 2)  MAmmogram and cbone density due later in the year.  3)  Consider shingles vaccine (Zostavax).   Flex Sig Next Due:  Not Indicated Last Colonoscopy:  Diverticulosis (02/01/2005 3:11:18 PM) Colonoscopy Next Due:  10 yr Hemoccult Next Due:  Not Indicated PAP Next Due:  Not Indicated

## 2010-06-14 NOTE — Letter (Signed)
Summary: Dr. Daphine Deutscher Records,Colonoscopy Report  Dr. Daphine Deutscher Records,Colonoscopy Report   Imported By: Beau Fanny 02/12/2007 15:55:04  _____________________________________________________________________  External Attachment:    Type:   Image     Comment:   External Document

## 2010-06-14 NOTE — Assessment & Plan Note (Signed)
Summary: CPX/[facility]   Vital Signs:  Patient Profile:   75 Years Old Female Height:     62.75 inches Weight:      177 pounds Temp:     98.4 degrees F oral Pulse rate:   58 / minute BP sitting:   125 / 79  (left arm)  Vitals Entered By: Cooper Render (April 19, 2007 11:25 AM)                 Chief Complaint:  ck up, has ?, and sinus drainage.  History of Present Illness: mixed incontinence: was on detrol in past, but did not stop the leakage, wears pad daily not interested in surgery at this time  concerning lesion on leg, noted 6 weeks ago, itches some, no bleeding, not increasing in size 2 cm x 1cm  one wart frozen, returned and itches  Due for chol and DM has bone density scheduled up to date with mammogram no pap needed s/p hysterectomy, total  HTN: well controlled on current meds  Current Allergies (reviewed today): ! CODEINE Updated/Current Medications (including changes made in today's visit):  AMLODIPINE BESYLATE 5 MG TABS (AMLODIPINE BESYLATE) Take 1 tablet by mouth once a day SIMVASTATIN 20 MG TABS (SIMVASTATIN) Take 1 tablet by mouth every night PRILOSEC 20 MG CPDR (OMEPRAZOLE) Take 1 capsule by mouth once a day ATENOLOL-CHLORTHALIDONE 50-25 MG TABS (ATENOLOL-CHLORTHALIDONE) 1 tablet by mouth once a day POTASSIUM CHLORIDE CRYS CR 20 MEQ TBCR (POTASSIUM CHLORIDE CRYS CR)  ADULT ASPIRIN EC LOW STRENGTH 81 MG  TBEC (ASPIRIN) Take 1 tablet by mouth once a day FISH OIL CONCENTRATE 1000 MG  CAPS (OMEGA-3 FATTY ACIDS) Take 1 tablet by mouth once a day BL MULTIPLE VITAMINS   TABS (MULTIPLE VITAMINS-MINERALS) Take 1 tablet by mouth once a day CLARITIN 10 MG  TABS (LORATADINE) Take 1 tablet by mouth once a day as needed ZYRTEC ALLERGY 10 MG  TABS (CETIRIZINE HCL) Take 1 tablet by mouth once a day CALCIUM 600/VITAMIN D 600-200 MG-UNIT  TABS (CALCIUM CARBONATE-VITAMIN D) Take 1 tablet by mouth two times a day BL MAGNESIUM 250 MG  TABS (MAGNESIUM) Take 1 tablet by mouth  once a day GLUCOSAMINE-CHONDROITIN 250-200 MG  CAPS (GLUCOSAMINE-CHONDROITIN) Take 1 tablet by mouth once a day CO Q-10 120 MG  CAPS (COENZYME Q10) Take 1 capsule by mouth once a day   Past Medical History:    Hyperlipidemia    Hypertension    Urinary incontinence    Risk Factors:  Tobacco use:  never Alcohol use:  no    Physical Exam  General:     elderly, overweight in NAD Eyes:     No corneal or conjunctival inflammation noted. EOMI. Perrla. Funduscopic exam benign, without hemorrhages, exudates or papilledema. Vision grossly normal. Ears:     External ear exam shows no significant lesions or deformities.  Otoscopic examination reveals clear canals, tympanic membranes are intact bilaterally without bulging, retraction, inflammation or discharge. Hearing is grossly normal bilaterally. Nose:     nasal dischargemucosal pallor.   Mouth:     pharynx pink and moist.   Neck:     No deformities, masses, or tenderness noted. Lungs:     Normal respiratory effort, chest expands symmetrically. Lungs are clear to auscultation, no crackles or wheezes. Heart:     Normal rate and regular rhythm. S1 and S2 normal without gallop, murmur, click, rub or other extra sounds. Abdomen:     Bowel sounds positive,abdomen soft and non-tender  without masses, organomegaly or hernias noted. Pulses:     R and L posterior tibial pulses are full and equal bilaterally  Extremities:     No clubbing, cyanosis, edema, or deformity noted with normal full range of motion of all joints.   Skin:     1 x 2 cm raised pink lesion, scaly    Impression & Recommendations:  Problem # 1:  HYPERTENSION (ICD-401.9) well controlled Her updated medication list for this problem includes:    Amlodipine Besylate 5 Mg Tabs (Amlodipine besylate) .Marland Kitchen... Take 1 tablet by mouth once a day    Atenolol-chlorthalidone 50-25 Mg Tabs (Atenolol-chlorthalidone) .Marland Kitchen... 1 tablet by mouth once a day   Problem # 2:   HYPERLIPIDEMIA (ICD-272.4) Due for lFTs and chol panel Her updated medication list for this problem includes:    Simvastatin 20 Mg Tabs (Simvastatin) .Marland Kitchen... 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 Return for shave biopsy.  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 Due for chol, DXa.  Given tetanus, other vaccines UTD. Encouraged exercise, weight loss, healthy eating habits.   Complete Medication List: 1)  Amlodipine Besylate 5 Mg Tabs (Amlodipine besylate) .... Take 1 tablet by mouth once a day 2)  Simvastatin 20 Mg Tabs (Simvastatin) .... Take 1 tablet by mouth every night 3)  Prilosec 20 Mg Cpdr (Omeprazole) .... Take 1 capsule by mouth once a day 4)  Atenolol-chlorthalidone 50-25 Mg Tabs (Atenolol-chlorthalidone) .Marland Kitchen.. 1 tablet by mouth once a day 5)  Potassium Chloride Crys Cr 20 Meq Tbcr (Potassium chloride crys cr) 6)  Adult Aspirin Ec Low Strength 81 Mg Tbec (Aspirin) .... Take 1 tablet by mouth once a day 7)  Fish Oil Concentrate 1000 Mg Caps (Omega-3 fatty acids) .... Take 1 tablet by mouth once a day 8)  Bl Multiple Vitamins Tabs (Multiple vitamins-minerals) .... Take 1 tablet by mouth once a day 9)  Claritin 10 Mg Tabs (Loratadine) .... Take 1 tablet by mouth once a day as needed 10)  Zyrtec Allergy 10 Mg Tabs (Cetirizine hcl) .... Take 1 tablet by mouth once a day 11)  Calcium 600/vitamin D 600-200 Mg-unit Tabs (Calcium carbonate-vitamin d) .... Take 1 tablet by mouth two times a day 12)  Bl Magnesium 250 Mg Tabs (Magnesium) .... Take 1 tablet by mouth once a day 13)  Glucosamine-chondroitin 250-200 Mg Caps (Glucosamine-chondroitin) .... Take 1 tablet by mouth once a day 14)  Co Q-10 120 Mg Caps (Coenzyme q10) .... Take 1 capsule by mouth  once a day  Other Orders: Tdap => 53yrs IM (21308) Admin 1st Vaccine (65784)   Patient Instructions: 1)  labs  in  52month BMET ICD-9: 401.1 2)  Hepatic Panel , ICD-9: 3)  Lipid Panel , ICD-9: 272.0 4)  return for 30 min procedure visit: freeze wart on right arm, shave biopsy on right leg    ]  Influenza Immunization History:    Influenza # 1:  Historical (02/13/2007)  Pneumovax Immunization History:    Pneumovax # 1:  Historical (05/16/2003)  Tetanus/Td Vaccine    Vaccine Type: Tdap      Tetanus/Td Vaccine    Vaccine Type: Tdap    Site: left deltoid    Mfr: Sanofi Pasteur    Dose: 0.5 ml  Route: IM    Given by: Cooper Render    Exp. Date: 03/16/2009    Lot #: [id number]    VIS given: 11/23/04 version given April 19, 2007.

## 2010-06-14 NOTE — Assessment & Plan Note (Signed)
Summary: FLU SHOT/CLE  Nurse Visit   Allergies: 1)  ! Codeine  Immunizations Administered:  Influenza Vaccine # 1:    Vaccine Type: Fluvax 3+    Site: left deltoid    Mfr: GlaxoSmithKline    Dose: 0.5 ml    Route: IM    Given by: Mervin Hack CMA (AAMA)    Exp. Date: 11/11/2009    Lot #: [id number]    VIS given: 12/06/06 version given February 09, 2009.  Flu Vaccine Consent Questions:    Do you have a history of severe allergic reactions to this vaccine? no    Any prior history of allergic reactions to egg and/or gelatin? no    Do you have a sensitivity to the preservative Thimersol? no    Do you have a past history of Guillan-Barre Syndrome? no    Do you currently have an acute febrile illness? no    Have you ever had a severe reaction to latex? no    Vaccine information given and explained to patient? yes    Are you currently pregnant? no  Orders Added: 1)  Flu Vaccine 19yrs + [90658] 2)  Admin 1st Vaccine [[id number]]

## 2010-06-14 NOTE — Assessment & Plan Note (Signed)
Summary: EKG/BP/AMD  Nurse Visit   Vital Signs:  Patient profile:   75 year old female Height:      62.75 inches Pulse rate:   64 / minute BP sitting:   132 / 70  (right arm) Cuff size:   regular  Vitals Entered By: Hardin Negus, RMA (July 09, 2009 8:40 AM)  Comments pt will follow up at appt on Tues with Dr Mariah Milling   Problems Prior to Update: 1)  Palpitations  (ICD-785.1) 2)  Hypokalemia  (ICD-276.8) 3)  Mammogram, Abnormal, Left  (ICD-793.80) 4)  Other Malaise and Fatigue  (ICD-780.79) 5)  Uti  (ICD-599.0) 6)  Constipation  (ICD-564.00) 7)  Nausea  (ICD-787.02) 8)  Obesity  (ICD-278.00) 9)  Osteoarthritis  (ICD-715.90) 10)  Gerd  (ICD-530.81) 11)  Allergic Rhinitis  (ICD-477.9) 12)  Diabetes Mellitus, Type II  (ICD-250.00) 13)  Cellulitis, Leg, Right  (ICD-682.6) 14)  Special Screening For Osteoporosis  (ICD-V82.81) 15)  Neoplasm, Skin, Uncertain Behavior  (ICD-238.2) 16)  Urinary Incontinence  (ICD-788.30) 17)  Hypertension  (ICD-401.9) 18)  Hyperlipidemia  (ICD-272.4) 19)  Verruca Vulgaris  (ICD-078.10) 20)  Actinic Keratosis  (ICD-702.0) 21)  Seborrheic Keratosis  (ICD-702.19)  Current Medications (verified): 1)  Amlodipine Besylate 5 Mg Tabs (Amlodipine Besylate) .... Take 1 Tablet By Mouth Once A Day 2)  Simvastatin 40 Mg Tabs (Simvastatin) .... Take 1 Tablet By Mouth Once A Day 3)  Omeprazole 20 Mg Tbec (Omeprazole) .... Take 1 Tablet By Mouth Once A Day 4)  Metoprolol Succinate 100 Mg Xr24h-Tab (Metoprolol Succinate) .... Take One Tablet By Mouth Daily 5)  Otc Potassium .Marland Kitchen.. 1 By Mouth Once Daily 6)  Adult Aspirin Ec Low Strength 81 Mg  Tbec (Aspirin) .... Take 1 Tablet By Mouth Once A Day 7)  Fish Oil Concentrate 1000 Mg  Caps (Omega-3 Fatty Acids) .... Take 1 Tablet By Mouth Once A Day 8)  Bl Multiple Vitamins   Tabs (Multiple Vitamins-Minerals) .... Take 1 Tablet By Mouth Once A Day 9)  Calcium 600/vitamin D 600-200 Mg-Unit  Tabs (Calcium  Carbonate-Vitamin D) .... Take 1 Tablet By Mouth Daily 10)  Bl Magnesium 250 Mg  Tabs (Magnesium) .... Take 1 Tablet By Mouth Once A Day 11)  Glucosamine Sulfate 1000 Mg Caps (Glucosamine Sulfate) .Marland Kitchen.. 1 By Mouth Once Daily 12)  Co Q-10 120 Mg  Caps (Coenzyme Q10) .... Take 1 Capsule By Mouth Once A Day 13)  Glucometer of Choice .... Check Blood Sugar Daily  Dx 250.00 14)  True Track Glucometer Test Strips .... Check Blood Sugar Once A Day  Allergies (verified): 1)  ! Codeine  Past History:  Past Medical History: Last updated: 06/11/2007 Hyperlipidemia Hypertension Urinary incontinence Allergic rhinitis GERD Osteoarthritis  Past Surgical History: Last updated: 05/28/2009 Echo Stress Test Rexene Edison. Smith:  Cardiolite in 2004.  Normal) DEXA (-) 2007 Cholecystectomy 1990's TA Hysterectomy 1993  Family History: Last updated: 11-Jun-2007 Father: Died 43 MI Mother: Died 81, dementia, CVA Siblings: 1 sister, ovarian CA, age 72 DM:  Maternal side Ovarian CA:  Sister, grandmother, great aunt  Social History: Last updated: Jun 11, 2007 Never Smoked Alcohol use-no Drug use-no Regular exercise-yes, Curves 2x/week Marital Status: Married x 48 years Children: None Occupation: Scientist, product/process development, works part-time as Asst. to [employer] Diet:  Fruit and veggies, no FF, some water, artificial sweetener  Risk Factors: Alcohol Use: <1 (05/28/2009) Caffeine Use: 1-2  cup a day (05/28/2009) Exercise: no  (05/28/2009)  Risk Factors: Smoking Status: never (05/28/2009)

## 2010-06-14 NOTE — Letter (Signed)
Summary: Results Follow up Letter  [name]  [address] [place], [place] [address]   Phone: [phone]  Fax: [phone]    10/17/2006 MRN: [id number]  [address] [place], [place]  [address]  Dear Janet Chapman,  The following are the results of your recent test(s):  Test         Result    Pap Smear:        Normal _____  Not Normal _____ Comments: ______________________________________________________ Cholesterol: LDL(Bad cholesterol):         Your goal is less than:         HDL (Good cholesterol):       Your goal is more than: Comments:  ______________________________________________________ Mammogram:        Normal _____  Not Normal _____ Comments:  ___________________________________________________________________ Hemoccult:        Normal _____  Not normal _______ Comments:    _____________________________________________________________________ Other Tests:    We routinely do not discuss normal results over the telephone.  If you desire a copy of the results, or you have any questions about this information we can discuss them at your next office visit.   Sincerely,

## 2010-06-16 NOTE — Letter (Signed)
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

## 2010-06-16 NOTE — Assessment & Plan Note (Signed)
Summary: SINUS SYMPTOMS/ lb   Vital Signs:  Patient profile:   75 year old female Height:      62.75 inches Weight:      164.50 pounds BMI:     29.48 Temp:     97.9 degrees F oral Pulse rate:   64 / minute Pulse rhythm:   regular BP sitting:   128 / 70  (left arm) Cuff size:   regular  Vitals Entered By: Benny Lennert CMA Duncan Dull) (June 09, 2010 11:54 AM)  History of Present Illness: Chief complaint ? sinus infection  75 year old female:  Around the first of last week, then started to feel worse through monday. Started to feel worse, then having a lot of pain in her head and phlegm.   Tried to take some otv cor sinus congestions.  Now feeling awful and cannot really sleep well.   Gets a little hot. Also taking some congestion.   Acute Visit History:      The patient complains of cough, fever, headache, nasal discharge, sinus problems, and sore throat.  These symptoms began 2 weeks ago.  She denies abdominal pain, chest pain, constipation, diarrhea, earache, nausea, and vomiting.        'Cold' or URI symptoms have been present with the sore throat.  There is no history of dysphagia, drooling, or recent exposure to strep.        She complains of sinus pressure, teeth aching, ears being blocked, nasal congestion, purulent drainage, and frontal headache.  The patient has had a past history of sinusitis and previous sinus surgery.        Urine output has been normal.  She is tolerating clear liquids.        Allergies: 1)  ! Codeine  Past History:  Past medical, surgical, family and social histories (including risk factors) reviewed, and no changes noted (except as noted below).  Past Medical History: Reviewed history from 05/31/2007 and no changes required. Hyperlipidemia Hypertension Urinary incontinence Allergic rhinitis GERD Osteoarthritis  Past Surgical History: Reviewed history from 05/28/2009 and no changes required. Echo Stress Test (H. Smith:  Cardiolite in  2004.  Normal) DEXA (-) 2007 Cholecystectomy 1990's TA Hysterectomy 1993  Family History: Reviewed history from 05/31/2007 and no changes required. Father: Died 76 MI Mother: Died 63, dementia, CVA Siblings: 1 sister, ovarian CA, age 7 DM:  Maternal side Ovarian CA:  Sister, grandmother, great aunt  Social History: Reviewed history from 05/31/2007 and no changes required. 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 CV: No chest pain or SOB GI: No noted N or V Otherwise, pertinent positives and negatives are noted in the HPI.   Physical Exam  Additional Exam:  Gen: WDWN, NAD; alert,appropriate and cooperative throughout exam  HEENT: Normocephalic and atraumatic. Throat clear, w/o exudate, no LAD, R TM clear, L TM - good landmarks, No fluid present. rhinnorhea.  Left frontal and maxillary sinuses: Tender Right frontal and maxillary sinuses: Tender  Neck: No ant or post LAD  CV: RRR, No M/G/R  Pulm: Breathing comfortably in no resp distress. no w/c/r  Abd: S,NT,ND,+BS  Extr: no c/c/e  Psych: full affect, pleasant    Impression & Recommendations:  Problem # 1:  SINUSITIS - ACUTE-NOS (ICD-461.9) Assessment New uri that has converted into diffuse sinusitis  Her updated medication list for this problem includes:    Amoxicillin 875 Mg Tabs (Amoxicillin) .Marland Kitchen... 1 by mouth two times a day  Complete Medication List: 1)  Diltiazem Hcl Cr 120 Mg Xr12h-cap (Diltiazem hcl) .Marland Kitchen.. 1 tab by mouth daily 2)  Simvastatin 40 Mg Tabs (Simvastatin) .... Take 1 tablet by mouth once a day 3)  Omeprazole 20 Mg Tbec (Omeprazole) .... Take 1 tablet by mouth once a day 4)  Metoprolol  Succinate 50 Mg Xr24h-tab (Metoprolol succinate) .Marland Kitchen.. 1 tab by mouth daily 5)  Adult Aspirin Ec Low Strength 81 Mg Tbec (Aspirin) .... Take 1 tablet by mouth once a day 6)  Fish Oil Concentrate 1000 Mg Caps (Omega-3 fatty acids) .... Take 1 tablet by mouth once a day 7)  Bl Multiple Vitamins Tabs (Multiple vitamins-minerals) .... Take 1 tablet by mouth once a day 8)  Calcium 600/vitamin D 600-200 Mg-unit Tabs (Calcium carbonate-vitamin d) .... Take 1 tablet by mouth daily 9)  Bl Magnesium 250 Mg Tabs (Magnesium) .... Take 1 tablet by mouth once a day 10)  Co Q-10 120 Mg Caps (Coenzyme q10) .... Take 1 capsule by mouth once a day 11)  Glucometer of Choice  .... Check blood sugar daily  dx 250.00 12)  True Track Glucometer Test Strips  .... Check blood sugar once a day 13)  Diflucan 150 Mg Tabs (Fluconazole) .... Once daily 14)  Clobetasol Propionate 0.05 % Crea (Clobetasol propionate) .... Apply to vaginal area daily 15)  Losartan Potassium 25 Mg Tabs (Losartan potassium) .Marland Kitchen.. 1 tab by mouth by mouth daily 16)  Trazodone Hcl 50 Mg Tabs (Trazodone hcl) .... 1/2 to 1 tab by mouth at bedtime for insomnia 17)  Amoxicillin 875 Mg Tabs (Amoxicillin) .Marland Kitchen.. 1 by mouth two times a day  Patient Instructions: 1)  PHENYLEPHRINE 12 HOUR FORMULATION (Decongestant) 2)  SINUSITIS 3)  Sinuses are cavities in facial skeleton that drain to nose. Impaired drainage and obstruction of sinus passages main cause. 4)  Treatment: 5)  1. Take all Antibiotics 6)  2. Open nasal and sinus canals: Oral decongestant: Sudafed. (CAUTION IF HIGH BLOOD PRESSURE) 7)  3. Steam inhalation 8)  4. Humidifier in room 9)  5. Frequent nasal saline irrigation 10)  6. Moist heat compresses to face 11)  7. Tylenol or Ibuprofen for pain and fever, follow directions on bottle.  Prescriptions: AMOXICILLIN 875 MG TABS (AMOXICILLIN) 1 by mouth two times a day  #20 x 0   Entered and Authorized by:   Hannah Beat MD   Signed by:   Hannah Beat MD on 06/09/2010   Method used:   Electronically to        [facility]* (retail)       [address] [place], [place]  [address]       Ph: [phone] or [phone]       Fax: [phone]   RxID:   [id number]    Orders Added: 1)  Est. Patient Level IV [27253]    Current Allergies (reviewed today): ! CODEINE

## 2010-06-16 NOTE — Assessment & Plan Note (Signed)
Summary: CPX/alc   Vital Signs:  Patient profile:   75 year old female Height:      62.75 inches Weight:      166.8 pounds BMI:     29.89 Temp:     97.8 degrees F oral Pulse rate:   64 / minute Pulse rhythm:   regular BP sitting:   160 / 84  (left arm) Cuff size:   regular  Vitals Entered By: Benny Lennert CMA Duncan Dull) (May 20, 2010 9:14 AM)  Vision Screening:      Vision Comments: 05/2010  Vision Entered By: Benny Lennert CMA (AAMA) (May 20, 2010 9:15 AM)  Hearing Screen 40db HL: Left  Right  Audiometry Comment: patient can hear a whispher from across the room    History of Present Illness: Chief complaint wellness visit  I have personally reviewed the Medicare Annual Wellness questionnaire and have noted 1.   The patient's medical and social history 2.   Their use of alcohol, tobacco or illicit drugs 3.   Their current medications and supplements 4.   The patient's functional ability including ADL's, fall risks, home safety risks and hearing or visual             impairment. 5.   Diet and physical activities 6.   Evidence for depression or mood disorders The patients weight, height, BMI and visual acuity have been recorded in the chart I have made referrals, counseling and provided education to the patient based review of the above and I have provided the pt with a written personalized care plan for preventive services.    4 lb weight gain since last OV. HAd lost much more on nutrisystem.  Planning to restart.  EA:VWUJ controlled. Does not check blood sugar at home.   Working on lifestylec changes.   High cholesterol: Worsened over holiday..off diet. on simvastatin 40 mg daily.   HTN: Elevated here today. Has been intermittantly elevated in past few months. On diltiazem and metoprolol.  Saw Dr. Mariah Milling for palpitations last year... using metoprolol extra 50 mg rarely becasue pulse okay.  Diabetes Management History:      She says that she is  exercising.  Type of exercise includes: walk.  Duration of exercise is estimated to be <30    Preventive Screening-Counseling & Management  Alcohol-Tobacco     Smoking Status: never  Caffeine-Diet-Exercise     Diet Comments: poor over holidays     Diet Counseling: to improve diet; diet is suboptimal     Nutrition Referrals: no     Does Patient Exercise: yes     Type of exercise: walk     Exercise (avg: min/session): <30     Exercise Counseling: to improve exercise regimen  Problems Prior to Update: 1)  Physical Examination  (ICD-V70.0) 2)  Insomnia, Chronic  (ICD-307.42) 3)  Mammogram, Abnormal, Left  (ICD-793.80) 4)  Constipation  (ICD-564.00) 5)  Obesity  (ICD-278.00) 6)  Osteoarthritis  (ICD-715.90) 7)  Gerd  (ICD-530.81) 8)  Allergic Rhinitis  (ICD-477.9) 9)  Diabetes Mellitus, Type II  (ICD-250.00) 10)  Special Screening For Osteoporosis  (ICD-V82.81) 11)  Neoplasm, Skin, Uncertain Behavior  (ICD-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 1 Tab By Mouth Daily 2)  Simvastatin 40 Mg Tabs (Simvastatin) .Marland KitchenMarland KitchenMarland Kitchen  Take 1 Tablet By Mouth Once A Day 3)  Omeprazole 20 Mg Tbec (Omeprazole) .... Take 1 Tablet By Mouth Once A Day 4)  Metoprolol Succinate 50 Mg Xr24h-Tab (Metoprolol Succinate) .Marland Kitchen.. 1 Tab By Mouth Daily 5)  Otc Potassium .Marland Kitchen.. 1 By Mouth Once Daily 6)  Adult Aspirin Ec Low Strength 81 Mg  Tbec (Aspirin) .... Take 1 Tablet By Mouth Once A Day 7)  Fish Oil Concentrate 1000 Mg  Caps (Omega-3 Fatty Acids) .... Take 1 Tablet By Mouth Once A Day 8)  Bl Multiple Vitamins   Tabs (Multiple Vitamins-Minerals) .... Take 1 Tablet By Mouth Once A Day 9)  Calcium 600/vitamin D 600-200 Mg-Unit  Tabs (Calcium Carbonate-Vitamin D) .... Take 1 Tablet By  Mouth Daily 10)  Bl Magnesium 250 Mg  Tabs (Magnesium) .... Take 1 Tablet By Mouth Once A Day 11)  Co Q-10 120 Mg  Caps (Coenzyme Q10) .... Take 1 Capsule By Mouth Once A Day 12)  Glucometer of Choice .... Check Blood Sugar Daily  Dx 250.00 13)  True Track Glucometer Test Strips .... Check Blood Sugar Once A Day 14)  Diflucan 150 Mg Tabs (Fluconazole) .... Once Daily 15)  Clobetasol Propionate 0.05 % Crea (Clobetasol Propionate) .... Apply To Vaginal Area Daily  Allergies: 1)  ! Codeine  Past History:  Past medical, surgical, family and social histories (including risk factors) reviewed, and no changes noted (except as noted below).  Past Medical History: Reviewed history from 05/31/2007 and no changes required. Hyperlipidemia Hypertension Urinary incontinence Allergic rhinitis GERD Osteoarthritis  Past Surgical History: Reviewed history from 05/28/2009 and no changes required. Echo Stress Test (H. Smith:  Cardiolite in 2004.  Normal) DEXA (-) 2007 Cholecystectomy 1990's TA Hysterectomy 1993  Family History: Reviewed history from 05/31/2007 and no changes required. Father: Died 4 MI Mother: Died 37, dementia, CVA Siblings: 1 sister, ovarian CA, age 81 DM:  Maternal side Ovarian CA:  Sister, grandmother, great aunt  Social History: Reviewed history from 05/31/2007 and no changes required. 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  General:  Denies fatigue and fever. CV:  Denies chest pain or discomfort and swelling of feet. Resp:  Denies shortness of breath. GI:  Denies abdominal pain. GU:  Denies dysuria.  Physical Exam  General:   Well-developed,well-nourished,in no acute distress; alert,appropriate and cooperative throughout examination Ears:  External ear exam shows no significant lesions or deformities.  Otoscopic examination reveals clear canals, tympanic membranes are intact bilaterally without bulging, retraction, inflammation or discharge. Hearing is grossly normal bilaterally. Nose:  External nasal examination shows no deformity or inflammation. Nasal mucosa are pink and moist without lesions or exudates. Mouth:  Oral mucosa and oropharynx without lesions or exudates.  Teeth in good repair. Neck:  no carotid bruit or thyromegaly no cervical or supraclavicular lymphadenopathy  Chest Wall:  No deformities, masses, or tenderness noted. Breasts:  No mass, nodules, thickening, tenderness, bulging, retraction, inflamation, nipple discharge or skin changes noted.   Lungs:  Normal respiratory effort, chest expands symmetrically. Lungs are clear to auscultation, no crackles or wheezes. Heart:  Normal rate and regular rhythm. S1 and S2 normal without gallop, murmur, click, rub or  other extra sounds. Abdomen:  Bowel sounds positive,abdomen soft and non-tender without masses, organomegaly or hernias noted. Genitalia:  not indicated  Pulses:  R and L posterior tibial pulses are full and equal bilaterally  Extremities:  no edema Neurologic:  No cranial nerve deficits noted. Station and gait are normal. Plantar reflexes are down-going bilaterally. DTRs are symmetrical throughout. Sensory, motor and coordinative functions appear intact. Skin:  Intact without suspicious lesions or rashes Psych:  Cognition and judgment appear intact. Alert and cooperative with normal attention span and concentration. No apparent delusions, illusions, hallucinations  Diabetes Management Exam:    Foot Exam (with socks and/or shoes not present):       Sensory-Pinprick/Light touch:          Left medial foot (L-4): normal          Left dorsal foot  (L-5): normal          Left lateral foot (S-1): normal          Right medial foot (L-4): normal          Right dorsal foot (L-5): normal          Right lateral foot (S-1): normal       Sensory-Monofilament:          Left foot: normal          Right foot: normal       Inspection:          Left foot: normal          Right foot: normal       Nails:          Left foot: normal          Right foot: normal    Eye Exam:       Eye Exam done elsewhere          Date: 05/15/2009          Results: nml          Done by: Elwyn Reach   Impression & Recommendations:  Problem # 1:  Preventive Health Care (ICD-V70.0) The patient's preventative maintenance and recommended screening tests for an annual wellness exam were reviewed in full today. Brought up to date unless services declined.  Counselled on the importance of diet, exercise, and its role in overall health and mortality. The patient's FH and SH was reviewed, including their home life, tobacco status, and drug and alcohol status.     Problem # 2:  DIABETES MELLITUS, TYPE II (ICD-250.00) Well controlled with diet.  Her updated medication list for this problem includes:    Adult Aspirin Ec Low Strength 81 Mg Tbec (Aspirin) .Marland Kitchen... Take 1 tablet by mouth once a day    Losartan Potassium 25 Mg Tabs (Losartan potassium) .Marland Kitchen... 1 tab by mouth by mouth daily  Problem # 3:  HYPERTENSION (ICD-401.9) Poor  control. Add losartan...follow at home.  Her updated medication list for this problem includes:    Diltiazem Hcl Cr 120 Mg Xr12h-cap (Diltiazem hcl) .Marland Kitchen... 1 tab by mouth daily    Metoprolol Succinate 50 Mg Xr24h-tab (Metoprolol succinate) .Marland Kitchen... 1 tab by mouth daily    Losartan Potassium 25 Mg Tabs (Losartan potassium) .Marland Kitchen... 1 tab by mouth by mouth daily  BP today: 160/84 Prior BP: 140/90 (03/10/2010)  Prior 10 Yr Risk Heart Disease: 8 % (05/11/2009)  Labs Reviewed: K+: 4.5 (05/18/2010) Creat: : 0.6 (05/18/2010)   Chol: 197 (05/18/2010)    HDL: 69.70 (05/18/2010)  LDL: 117 (05/18/2010)   TG: 53.0 (05/18/2010)  Problem # 4:  HYPERLIPIDEMIA (ICD-272.4) Worsened control. Get back on track with diet. Recheck in 3 months.  Her updated medication list for this problem includes:    Simvastatin 40 Mg Tabs (Simvastatin) .Marland Kitchen... Take 1 tablet by mouth once a day  Labs Reviewed: SGOT: 25 (05/18/2010)   SGPT: 26 (05/18/2010)  Lipid Goals: Chol Goal: 200 (01/28/2009)   HDL Goal: 40 (01/28/2009)   LDL Goal: 100 (01/28/2009)   TG Goal: 150 (01/28/2009)  Prior 10 Yr Risk Heart Disease: 8 % (05/11/2009)   HDL:69.70 (05/18/2010), 65.50 (11/11/2009)  LDL:117 (05/18/2010), 93 (11/91/4782)  Chol:197 (05/18/2010), 173 (11/11/2009)  Trig:53.0 (05/18/2010), 74.0 (11/11/2009)  Problem # 5:  INSOMNIA, CHRONIC (ICD-307.42) Trial of trazodone for sleep.   Complete Medication List: 1)  Diltiazem Hcl Cr 120 Mg Xr12h-cap (Diltiazem hcl) .Marland Kitchen.. 1 tab by mouth daily 2)  Simvastatin 40 Mg Tabs (Simvastatin) .... Take 1 tablet by mouth once a day 3)  Omeprazole 20 Mg Tbec (Omeprazole) .... Take 1 tablet by mouth once a day 4)  Metoprolol Succinate 50 Mg Xr24h-tab (Metoprolol succinate) .Marland Kitchen.. 1 tab by mouth daily 5)  Adult Aspirin Ec Low Strength 81 Mg Tbec (Aspirin) .... Take 1 tablet by mouth once a day 6)  Fish Oil Concentrate 1000 Mg Caps (Omega-3 fatty acids) .... Take 1 tablet by mouth once a day 7)  Bl Multiple Vitamins Tabs (Multiple vitamins-minerals) .... Take 1 tablet by mouth once a day 8)  Calcium 600/vitamin D 600-200 Mg-unit Tabs (Calcium carbonate-vitamin d) .... Take 1 tablet by mouth daily 9)  Bl Magnesium 250 Mg Tabs (Magnesium) .... Take 1 tablet by mouth once a day 10)  Co Q-10 120 Mg Caps (Coenzyme q10) .... Take 1 capsule by mouth once a day 11)  Glucometer of Choice  .... Check blood sugar daily  dx 250.00 12)  True Track Glucometer Test Strips  .... Check blood sugar once a day 13)  Diflucan 150 Mg Tabs (Fluconazole) .... Once  daily 14)  Clobetasol Propionate 0.05 % Crea (Clobetasol propionate) .... Apply to vaginal area daily 15)  Losartan Potassium 25 Mg Tabs (Losartan potassium) .Marland Kitchen.. 1 tab by mouth by mouth daily 16)  Trazodone Hcl 50 Mg Tabs (Trazodone hcl) .... 1/2 to 1 tab by mouth at bedtime for insomnia  Other Orders: Medicare -1st Annual Wellness Visit [phone]) Pneumococcal Vaccine (30865) Admin 1st Vaccine (78469) Zoster (Shingles) Vaccine Live [phone]) Admin of Any Addtl Vaccine (84132)  Diabetes Management Assessment/Plan:      The following lipid goals have been established for the patient: Total cholesterol goal of 200; LDL cholesterol goal of 100; HDL cholesterol goal of 40; Triglyceride goal of 150.  Her blood pressure goal is < 130/80.    Patient Instructions: 1)  I have provided you with a copy of your personalized plan for preventive services. Please take the time to review along with your updated medication list.  2)  Start losartan for BP. Follow at home.. call if BP > 140/90.  3)  Please schedule a follow-up appointment in 6 months .  4)  BMP prior to visit, ICD-9: 250.00 5)  Hepatic Panel prior to visit ICD-9:  6)  Lipid panel prior to visit ICD-9 :  7)  HgBA1c prior to visit  ICD-9:  8)  SCheduled bone density.. call if order needed.  Prescriptions: TRAZODONE HCL 50 MG TABS (TRAZODONE HCL) 1/2 to 1 tab by mouth at bedtime for  insomnia  #30 x 0   Entered and Authorized by:   Kerby Nora MD   Signed by:   Kerby Nora MD on 05/20/2010   Method used:   Electronically to        [facility]* (retail)       [address] [place], [place]  [address]       Ph: [phone] or [phone]       Fax: [phone]   RxID:   [id number] LOSARTAN POTASSIUM 25 MG TABS (LOSARTAN POTASSIUM) 1 tab by mouth by mouth daily  #30 x 11   Entered and Authorized by:   Kerby Nora MD   Signed by:   Kerby Nora MD on 05/20/2010   Method  used:   Electronically to        [facility]* (retail)       [address] [place], [place]  [address]       Ph: [phone] or [phone]       Fax: [phone]   RxID:   [id number] OMEPRAZOLE 20 MG TBEC (OMEPRAZOLE) Take 1 tablet by mouth once a day  #90 x 3   Entered and Authorized by:   Kerby Nora MD   Signed by:   Kerby Nora MD on 05/20/2010   Method used:   Electronically to        [facility]* (retail)       [address] [place], [place]  [address]       Ph: [phone] or [phone]       Fax: [phone]   RxID:   [id number]    Orders Added: 1)  Medicare -1st Annual Wellness Visit [G0438] 2)  Est. Patient Level III [10175] 3)  Pneumococcal Vaccine [90732] 4)  Admin 1st Vaccine [90471] 5)  Zoster (Shingles) Vaccine Live [90736] 6)  Admin of Any Addtl Vaccine [90472]   Immunizations Administered:  Pneumonia Vaccine:    Vaccine Type: Pneumovax (Medicare)    Site: left deltoid    Mfr: Merck    Dose: 0.5 ml    Route: IM    Given by: Benny Lennert CMA (AAMA)    Exp. Date: 07/23/2011    Lot #: [id number]    VIS given: 04/19/09 version given May 20, 2010.  Zostavax # 1:    Vaccine Type: Zostavax    Site: right deltoid    Mfr: Merck    Dose: 0.5 ml    Route: IM    Given by: Benny Lennert CMA (AAMA)    Exp. Date: 12/31/2010    Lot #: [id number]    VIS given: 02/24/05 given May 20, 2010.   Immunizations Administered:  Pneumonia Vaccine:    Vaccine Type: Pneumovax (Medicare)    Site: left deltoid    Mfr: Merck    Dose: 0.5 ml    Route: IM    Given by: Benny Lennert CMA (AAMA)    Exp. Date: 07/23/2011    Lot #: [id number]    VIS given: 04/19/09 version given May 20, 2010.  Zostavax # 1:    Vaccine Type: Zostavax    Site: right deltoid  Mfr: Merck    Dose: 0.5 ml    Route: IM    Given by: Benny Lennert CMA (AAMA)    Exp. Date: 12/31/2010    Lot #: [id number]    VIS given: 02/24/05 given May 20, 2010.  Current Allergies (reviewed today): ! CODEINE  Last Flu Vaccine:  Fluvax 3+ (02/09/2009 9:28:11 AM) Flu Vaccine Result Date:  02/12/2009 Flu Vaccine Result:  given Flu Vaccine Next Due:  1 yr

## 2010-06-29 ENCOUNTER — Ambulatory Visit (INDEPENDENT_AMBULATORY_CARE_PROVIDER_SITE_OTHER): Payer: Medicare HMO | Admitting: Family Medicine

## 2010-06-29 ENCOUNTER — Encounter: Payer: Self-pay | Admitting: Family Medicine

## 2010-06-29 DIAGNOSIS — R05 Cough: Secondary | ICD-10-CM

## 2010-06-29 DIAGNOSIS — R059 Cough, unspecified: Secondary | ICD-10-CM

## 2010-07-06 NOTE — Assessment & Plan Note (Signed)
Summary: COUGH/CLE   HUMANA   Vital Signs:  Patient profile:   75 year old female Weight:      165 pounds Temp:     99.6 degrees F oral Pulse rate:   60 / minute Pulse rhythm:   regular BP sitting:   126 / 80  (left arm) Cuff size:   regular  Vitals Entered By: Selena Batten Dance CMA (AAMA) (June 29, 2010 10:22 AM) CC: Cough Comments **Had just finished cough drop when temp was taken**   History of Present Illness: CC: cough  seen 06/08/2010 with drainage, got better.  no longer having drainage.  in last week starting to feel poorly again.  Feels hacking cough going into chest.  Also with neck soreness/stiffness.  sore chest from coughing.  Cough drops and sinus allergy medicine.  + hoarse voice and nasal congestion.  Cough kept her up last night.  No fevers/chills, abd pain, n/v/d, rashes, myalgias, arthralgias.  Husband with similar illness.  No smokers at home.  no h/o asthma.  Goes to chiropractor to get adjusted for h/o bulging disc.  Current Medications (verified): 1)  Diltiazem Hcl Cr 120 Mg Xr12h-Cap (Diltiazem Hcl) .Marland Kitchen.. 1 Tab By Mouth Daily 2)  Simvastatin 40 Mg Tabs (Simvastatin) .... Take 1 Tablet By Mouth Once A Day 3)  Omeprazole 20 Mg Tbec (Omeprazole) .... Take 1 Tablet By Mouth Once A Day 4)  Metoprolol Succinate 50 Mg Xr24h-Tab (Metoprolol Succinate) .Marland Kitchen.. 1 Tab By Mouth Daily 5)  Adult Aspirin Ec Low Strength 81 Mg  Tbec (Aspirin) .... Take 1 Tablet By Mouth Once A Day 6)  Fish Oil Concentrate 1000 Mg  Caps (Omega-3 Fatty Acids) .... Take 1 Tablet By Mouth Once A Day 7)  Bl Multiple Vitamins   Tabs (Multiple Vitamins-Minerals) .... Take 1 Tablet By Mouth Once A Day 8)  Calcium 600/vitamin D 600-200 Mg-Unit  Tabs (Calcium Carbonate-Vitamin D) .... Take 1 Tablet By Mouth Daily 9)  Bl Magnesium 250 Mg  Tabs (Magnesium) .... Take 1 Tablet By Mouth Once A Day 10)  Co Q-10 120 Mg  Caps (Coenzyme Q10) .... Take 1 Capsule By Mouth Once A Day 11)  Glucometer of Choice ....  Check Blood Sugar Daily  Dx 250.00 12)  True Track Glucometer Test Strips .... Check Blood Sugar Once A Day 13)  Losartan Potassium 25 Mg Tabs (Losartan Potassium) .Marland Kitchen.. 1 Tab By Mouth By Mouth Daily 14)  Trazodone Hcl 50 Mg Tabs (Trazodone Hcl) .... 1/2 To 1 Tab By Mouth At Bedtime For Insomnia  Allergies: 1)  ! Codeine  Past History:  Past Medical History: Last updated: 05/31/2007 Hyperlipidemia Hypertension Urinary incontinence Allergic rhinitis GERD Osteoarthritis  Past Surgical History: Last updated: 05/28/2009 Echo Stress Test Rexene Edison. Smith:  Cardiolite in 2004.  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 No apparent alopecia or balding.  no sinus tenderness Eyes:  PERRLA, EOMI, no injection Ears:  TMs R covered by cerumen, L clear Nose:  nares clear bilaterally Mouth:  MMM, no  pharyngeal erythema/exudates Neck:  no carotid bruit or thyromegaly no cervical or supraclavicular lymphadenopathy  Lungs:  Normal respiratory effort, chest expands symmetrically. Lungs are clear to auscultation, no crackles or wheezes. Heart:  Normal rate and regular rhythm. S1 and S2 normal without gallop, murmur, click, rub or other extra sounds. Pulses:  2+ rad pulses, brisk cap refill Extremities:  no edema Skin:  Intact without suspicious lesions or rashes   Impression & Recommendations:  Problem # 1:  COUGH  (ICD-786.2) Assessment Unchanged possibly lingering from sinus infection, hwever did deteriorate recently.  zpack to hold on to in case not better or worsening to cover resp pathogens.Marland Kitchen  otherwise tessalon and mucinex DM for cough.  push fluids and rest.  update if not improving with treatment.  Complete Medication List: 1)  Diltiazem Hcl Cr 120 Mg Xr12h-cap (Diltiazem hcl) .Marland Kitchen.. 1 tab by mouth daily 2)  Simvastatin 40 Mg Tabs (Simvastatin) .... Take 1 tablet by mouth once a day 3)  Omeprazole 20 Mg Tbec (Omeprazole) .... Take 1 tablet by mouth once a day 4)  Metoprolol Succinate 50 Mg Xr24h-tab (Metoprolol succinate) .Marland Kitchen.. 1 tab by mouth daily 5)  Adult Aspirin Ec Low Strength 81 Mg Tbec (Aspirin) .... Take 1 tablet by mouth once a day 6)  Fish Oil Concentrate 1000 Mg Caps (Omega-3 fatty acids) .... Take 1 tablet by mouth once a day 7)  Bl Multiple Vitamins Tabs (Multiple vitamins-minerals) .... Take 1 tablet by mouth once a day 8)  Calcium 600/vitamin D 600-200 Mg-unit Tabs (Calcium carbonate-vitamin d) .... Take 1 tablet by mouth daily 9)  Bl Magnesium 250 Mg Tabs (Magnesium) .... Take 1 tablet by mouth once a day 10)  Co Q-10 120 Mg Caps (Coenzyme q10) .... Take 1 capsule by mouth once a day 11)  Glucometer of Choice  .... Check blood sugar daily  dx 250.00 12)  True Track Glucometer Test Strips  .... Check blood sugar once a day 13)  Losartan Potassium 25 Mg Tabs (Losartan potassium) .Marland Kitchen.. 1 tab by mouth by mouth daily 14)  Trazodone Hcl 50 Mg Tabs (Trazodone hcl) .... 1/2 to 1 tab by mouth at bedtime for insomnia 15)  Tessalon Perles 100 Mg Caps (Benzonatate) .... One by mouth three times a day as needed cough 16)  Zithromax Z-pak 250 Mg Tabs (Azithromycin) .... Use as directed  Patient Instructions: 1)  Possible bronchitis versus lingering from sinus infection. 2)  treat for now with tylenol for discomfort, push fluids and plenty of rest.  3)  tessalon perls for cough at night.  if cough  persisting or any fevers or worsening, fill antibiotic script given today. 4)  Mucinex DM for cough as well (over the counter).  Take with plenty of fluid. 5)  Please return if you are not improving as expected, or if you have high fevers (>101.5) or difficulty swallowing. 6)  Call clinic with questions.  Pleasure to see you today. Prescriptions: ZITHROMAX Z-PAK 250 MG TABS (AZITHROMYCIN) use as directed  #1 x 0   Entered and Authorized by:   Eustaquio Boyden  MD   Signed by:   Eustaquio Boyden  MD on 06/29/2010   Method used:   Print then Give to Patient   RxID:   [id number] TESSALON PERLES 100 MG CAPS (BENZONATATE) one by mouth three times a day as needed cough  #30 x 0   Entered and Authorized by:   Eustaquio Boyden  MD   Signed by:  Eustaquio Boyden  MD on 06/29/2010   Method used:   Electronically to        [facility]* (retail)       [address] [place], [place]  [address]       Ph: [phone] or [phone]       Fax: [phone]   RxID:   [id number]    Orders Added: 1)  Est. Patient Level III [27253]    Current Allergies (reviewed today): ! CODEINE

## 2010-07-19 ENCOUNTER — Telehealth: Payer: Self-pay | Admitting: Family Medicine

## 2010-07-21 ENCOUNTER — Encounter: Payer: Self-pay | Admitting: Family Medicine

## 2010-07-26 NOTE — Progress Notes (Signed)
Summary: needs order for bone density  Phone Note Call from Patient   Caller: Patient Call For: Kerby Nora MD Summary of Call: Patient has an appt for bone density scheduled for this thursday at [facility]. She needs an order faxed to them.  Initial call taken by: Melody Comas,  July 19, 2010 1:50 PM

## 2010-07-27 LAB — CBC
HCT: 37.6 % (ref 36.0–46.0)
Hemoglobin: 12.7 g/dL (ref 12.0–15.0)
MCH: 31.9 pg (ref 26.0–34.0)
MCHC: 33.8 g/dL (ref 30.0–36.0)
MCV: 94.5 fL (ref 78.0–100.0)
Platelets: 204 10*3/uL (ref 150–400)
RBC: 3.98 MIL/uL (ref 3.87–5.11)
RDW: 13.4 % (ref 11.5–15.5)
WBC: 5.9 10*3/uL (ref 4.0–10.5)

## 2010-07-27 LAB — POCT CARDIAC MARKERS
CKMB, poc: <1 ng/mL — ABNORMAL LOW (ref 1.0–8.0)
Myoglobin, poc: 42.1 ng/mL (ref 12–200)
Troponin i, poc: <0.05 ng/mL (ref 0.00–0.09)

## 2010-07-27 LAB — DIFFERENTIAL
Basophils Absolute: 0 10*3/uL (ref 0.0–0.1)
Basophils Relative: 0 % (ref 0–1)
Eosinophils Absolute: 0.1 10*3/uL (ref 0.0–0.7)
Eosinophils Relative: 2 % (ref 0–5)
Lymphocytes Relative: 37 % (ref 12–46)
Lymphs Abs: 2.1 10*3/uL (ref 0.7–4.0)
Monocytes Absolute: 0.6 10*3/uL (ref 0.1–1.0)
Monocytes Relative: 10 % (ref 3–12)
Neutro Abs: 3 10*3/uL (ref 1.7–7.7)
Neutrophils Relative %: 52 % (ref 43–77)

## 2010-07-27 LAB — BASIC METABOLIC PANEL
BUN: 14 mg/dL (ref 6–23)
CO2: 31 mEq/L (ref 19–32)
Calcium: 9.5 mg/dL (ref 8.4–10.5)
Chloride: 106 mEq/L (ref 96–112)
Creatinine, Ser: 0.69 mg/dL (ref 0.4–1.2)
GFR calc Af Amer: >60 mL/min (ref >60)
GFR calc non Af Amer: >60 mL/min (ref >60)
Glucose, Bld: 98 mg/dL (ref 70–99)
Potassium: 3.4 mEq/L — ABNORMAL LOW (ref 3.5–5.1)
Sodium: 144 mEq/L (ref 135–145)

## 2010-07-27 LAB — CARDIAC PANEL(CRET KIN+CKTOT+MB+TROPI)
CK, MB: 0.9 ng/mL (ref 0.3–4.0)
CK, MB: 1.1 ng/mL (ref 0.3–4.0)
Relative Index: INVALID (ref 0.0–2.5)
Relative Index: INVALID (ref 0.0–2.5)
Total CK: 71 U/L (ref 7–177)
Total CK: 79 U/L (ref 7–177)
Troponin I: 0.01 ng/mL (ref 0.00–0.06)
Troponin I: 0.02 ng/mL (ref 0.00–0.06)

## 2010-07-27 LAB — GLUCOSE, CAPILLARY
Glucose-Capillary: 109 mg/dL — ABNORMAL HIGH (ref 70–99)
Glucose-Capillary: 244 mg/dL — ABNORMAL HIGH (ref 70–99)

## 2010-07-27 LAB — CK TOTAL AND CKMB (NOT AT ARMC)
CK, MB: 1.1 ng/mL (ref 0.3–4.0)
Relative Index: INVALID (ref 0.0–2.5)
Total CK: 88 U/L (ref 7–177)

## 2010-07-27 LAB — TROPONIN I: Troponin I: 0.01 ng/mL (ref 0.00–0.06)

## 2010-08-04 ENCOUNTER — Other Ambulatory Visit: Payer: Self-pay | Admitting: Dermatology

## 2010-08-10 ENCOUNTER — Other Ambulatory Visit: Payer: Self-pay | Admitting: *Deleted

## 2010-08-10 MED ORDER — LOSARTAN POTASSIUM 25 MG PO TABS: ORAL_TABLET | ORAL | Status: DC

## 2010-09-30 NOTE — Op Note (Signed)
[name], [name]               ACCOUNT NO.:  [id number]   MEDICAL RECORD NO.:  [id number]          PATIENT TYPE:  AMB   LOCATION:  ENDO                         FACILITY:  [facility]   PHYSICIAN:  Danise Edge, M.D.   DATE OF BIRTH:  June 13, 1935   DATE OF PROCEDURE:  02/01/2005  DATE OF DISCHARGE:                                 OPERATIVE REPORT   PROCEDURE:  Colonoscopy.   REFERRING PHYSICIAN:  Dr. Madison Hickman.   INDICATIONS:  Janet Chapman is a 75 year old female born 1936/03/31.  Janet Chapman is undergoing a screening colonoscopy with polypectomy to prevent  colon cancer. She has noticed a recent change in her bowel movement  consistency. Her health maintenance flexible proctosigmoidoscopy performed 6  years ago revealed no colorectal neoplasia.   ENDOSCOPIST:  Danise Edge, M.D.   PREMEDICATION:  Versed 5 milligrams, Demerol 50 milligrams.   DESCRIPTION OF PROCEDURE:  After obtaining informed consent, Janet Chapman was  placed in the left lateral decubitus position. I administered intravenous  Demerol and intravenous Versed to achieve conscious sedation for the  procedure. The patient's blood pressure, oxygen saturation and cardiac  rhythm were monitored throughout the procedure and documented in the medical  record.   Anal inspection and digital rectal exam were normal. The Olympus adjustable  pediatric colonoscope was introduced into the rectum and advanced to the  cecum. A normal-appearing ileocecal valve and appendiceal orifice were  identified. Colonic preparation for the exam today was excellent.   RECTUM:  Normal. Retroflex view of the distal rectum normal.  SIGMOID COLON AND DESCENDING COLON:  Extensive colonic diverticulosis.  SPLENIC FLEXURE:  Normal.  TRANSVERSE COLON:  Normal.  HEPATIC FLEXURE:  Normal.  ASCENDING COLON:  Normal.  CECUM AND ILEOCECAL VALVE:  Normal.   ASSESSMENT:  Normal screening proctocolonoscopy to the cecum except for the  presence of left colonic diverticulosis. No endoscopic evidence for the  presence of colorectal neoplasia.           ______________________________  Danise Edge, M.D.     MJ/MEDQ  D:  02/01/2005  T:  02/01/2005  Job:  [id number]   cc:   Darius Bump, M.D.  [place]  [place] [address]  Fax: [phone]

## 2010-09-30 NOTE — Assessment & Plan Note (Signed)
[facility]                           [facility] OFFICE NOTE   NAME:GIBBSArmonie, Mettler                      MRN:          [id number]  DATE:04/13/2006                            DOB:          Mar 09, 1936    CHIEF COMPLAINT:  A 75 year old female here to establish new doctor.   HISTORY OF PRESENT ILLNESS:  Ms. Nawaz states that she has been doing  very well over the past few years. She is changing doctor's to be closer  to her home. She currently has the following concerns:  1. Mixed incontinence, chronic:  She states that she has some symptoms      where she leaks when she sneezes or coughs, as well as needing to      run to the bathroom with urge to urinate. She also states that she      has some dribbling throughout the day, when she cannot get to the      bathroom in time. She states that she was on Detrol in the past and      that seemed to help some with the over activity. She is interested      in learning about mixed incontinence further.  2. Allergic rhinitis:  She states that she has been having some      congestion and right sided headache for about 24 hours. The      headache did improve with 2 Tylenol last evening. She currently      takes Claritin. She would like to know if she should continue doing      so.  3. Hypertension, stable. She takes Caduet, Atenolol, and fish oil      daily. She denies any side effects from these medications.  4. Hypercholesterolemia, stable. She states that her cholesterol was      at a good level when it was checked last but she is not sure when      this was. She would like to switch over to generic Caduet, after      she completes her current prescription. She denies any muscle aches      and pains.   REVIEW OF SYSTEMS:  No chest pain, no dyspnea. No nausea and vomiting.  No constipation or rectal bleeding.   PAST MEDICAL HISTORY:  1. Hypertension.  2. Hypercholesterolemia.  3. Allergic rhinitis.  4.  GERD.  5. Urinary incontinence.  6. Osteoarthritis.  7. Borderline diabetes.   PAST PROCEDURE/SURGICAL HISTORY:  1. Echocardiogram.  2. Stress test.  3. 2007, DEXA negative.  4. 1990s, cholecystectomy.  5. 1993, TAH.  6. August 2007, mammogram.  7. In 2006, colonoscopy.   ALLERGIES:  CODEINE (causes nausea and vomiting.)   MEDICATIONS:  1. Caduet 20/5 mg daily.  2. Atenolol/Chlor 50/25 mg daily.  3. Potassium chloride 20 meq b.i.d.  4. Prilosec 20 mg daily.  5. Claritin 10 mg daily.  6. Co-Q 10 50 mg daily.  7. Multivitamin daily.  8. Fish oil daily.  9. Magnesium 250 mg daily.  10.Glucosamine chondroitin 1500 mg/1200 mg daily.  11.Calcium and vitamin D 1200/400  mg daily.   FAMILY HISTORY:  Father deceased at age 23 with myocardial infarction.  Mother deceased age 50 with dementia and CVA. She has 1 sister with  ovarian cancer who died at age 17 from this. She has diabetes on the  maternal side of her family. She also has a grandmother and great aunt  who died from ovarian cancer. There is no other type of cancer that runs  in the family.   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 She has been married  for 48 years. She exercises at [employer] 2 times per week. She has no  children. She eats a healthy diet with fruits and vegetables. No fast  food. Some water and uses artificial sweetener.   PHYSICAL EXAMINATION:  VITAL SIGNS:  Height 63 inches. Weight 173,  making BMI 30. Blood pressure 122/82, pulse 68 temperature 97.9.  GENERAL:  Overweight appearing female in no apparent distress. Appears  appropriate for age.  HEENT:  PERRLA. Extraocular muscles intact. Oropharynx clear. Tympanic  membranes clear. Nares clear. No thyromegaly, no lymphadenopathy. No  erythema.  LUNGS:  Clear to auscultation bilaterally. No wheezes, rales, or  rhonchi.  CARDIOVASCULAR:  Regular rate and rhythm. No  murmur, rub, or gallop.  ABDOMEN:  Soft, nontender. Normal active bowel sounds. No  hepatosplenomegaly.  MUSCULOSKELETAL:  Strength 5/5 in upper and lower extremities.  NEUROLOGIC:  Cranial nerves 2-12 are grossly intact. Reflexes 2+  patellar. Normal sensation in upper and lower extremities.   ASSESSMENT/PLAN:  1. Mixed incontinence:  We discussed that her incontinence does have a      component of urge as well as stress. She may also have an element      of overflow, as evidenced by the fact that she a dribble, when she      cannot get to the bathroom. She will continue to wear pads as      needed but we will begin her on generic oxybutynin 2.5 mg b.i.d. to      t.i.d. She requested generic. If her symptoms continue to bother      her, we can try one of the other medications available or consider      referring her to a urologist.  2. Allergic rhinitis:  Her headache and congestion is most likely      secondary to allergies. She will continue with Claritin and will      use Ibuprofen or nasal saline p.r.n.  3. Hypertension, stable:  Continue on current medications.  4. Hypercholesterolemia, stable:  Continue on current medications.  5. Prevention:  She is up-to-date with mammogram and colon cancer      screening. She is unsure when her last tetanus was. She is up to      date with Pneumovax and Influenza. I will get records from her      previous doctor and determine when her next cholesterol is due.     Kerby Nora, MD  Electronically Signed    AB/MedQ  DD: 04/13/2006  DT: 04/14/2006  Job #: [id number]

## 2010-11-02 ENCOUNTER — Other Ambulatory Visit: Payer: Self-pay | Admitting: *Deleted

## 2010-11-02 MED ORDER — DILTIAZEM HCL 120 MG PO TABS: 120.0000 mg | ORAL_TABLET | Freq: Every day | ORAL | Status: DC

## 2010-11-19 ENCOUNTER — Encounter: Payer: Self-pay | Admitting: Family Medicine

## 2010-11-21 LAB — HM MAMMOGRAPHY

## 2010-11-22 ENCOUNTER — Other Ambulatory Visit: Payer: Self-pay | Admitting: Family Medicine

## 2010-11-23 ENCOUNTER — Other Ambulatory Visit (INDEPENDENT_AMBULATORY_CARE_PROVIDER_SITE_OTHER): Payer: Medicare HMO | Admitting: Family Medicine

## 2010-11-23 DIAGNOSIS — E119 Type 2 diabetes mellitus without complications: Secondary | ICD-10-CM

## 2010-11-23 LAB — LIPID PANEL
Cholesterol: 162 mg/dL (ref 0–200)
HDL: 68.5 mg/dL (ref >39.00)
LDL Cholesterol: 84 mg/dL (ref 0–99)
Total CHOL/HDL Ratio: 2
Triglycerides: 47 mg/dL (ref 0.0–149.0)
VLDL: 9.4 mg/dL (ref 0.0–40.0)

## 2010-11-23 LAB — COMPREHENSIVE METABOLIC PANEL
ALT: 25 U/L (ref 0–35)
AST: 25 U/L (ref 0–37)
Albumin: 4.7 g/dL (ref 3.5–5.2)
Alkaline Phosphatase: 69 U/L (ref 39–117)
BUN: 18 mg/dL (ref 6–23)
CO2: 34 mEq/L — ABNORMAL HIGH (ref 19–32)
Calcium: 9.6 mg/dL (ref 8.4–10.5)
Chloride: 106 mEq/L (ref 96–112)
Creatinine, Ser: 0.7 mg/dL (ref 0.4–1.2)
GFR: 87.2 mL/min (ref >60.00)
Glucose, Bld: 93 mg/dL (ref 70–99)
Potassium: 4 mEq/L (ref 3.5–5.1)
Sodium: 142 mEq/L (ref 135–145)
Total Bilirubin: 0.6 mg/dL (ref 0.3–1.2)
Total Protein: 7.2 g/dL (ref 6.0–8.3)

## 2010-11-23 LAB — HEMOGLOBIN A1C: Hgb A1c MFr Bld: 6.7 % — ABNORMAL HIGH (ref 4.6–6.5)

## 2010-11-25 ENCOUNTER — Encounter: Payer: Self-pay | Admitting: Family Medicine

## 2010-11-25 ENCOUNTER — Ambulatory Visit (INDEPENDENT_AMBULATORY_CARE_PROVIDER_SITE_OTHER): Payer: Medicare HMO | Admitting: Family Medicine

## 2010-11-25 DIAGNOSIS — K5900 Constipation, unspecified: Secondary | ICD-10-CM

## 2010-11-25 DIAGNOSIS — E119 Type 2 diabetes mellitus without complications: Secondary | ICD-10-CM

## 2010-11-25 DIAGNOSIS — E785 Hyperlipidemia, unspecified: Secondary | ICD-10-CM

## 2010-11-25 DIAGNOSIS — I10 Essential (primary) hypertension: Secondary | ICD-10-CM

## 2010-11-25 NOTE — Patient Instructions (Addendum)
Follow BP at home.. Goal <130/80, call   If consistently above this goal.  Work on increasing exercise... Especially weight bearing exercsie. Let us know if you are interested in checking CA125 with next set of fasting labs.

## 2010-11-25 NOTE — Assessment & Plan Note (Signed)
Well controlled, given slight increase in A1C, increase exercise and continue work on diet changes.

## 2010-11-25 NOTE — Progress Notes (Signed)
  Subjective:    Patient ID: Janet Chapman, female    DOB: 30-Nov-1935, 75 y.o.   MRN: [id number]  HPI  Diabetes: Well controlle dwith lifestyle. A1C trended up slightly but remains at goal Using medications without difficulties: Yes Hypoglycemic episodes: two to three episodes but has not measured. Hyperglycemic episodes:None Feet problems: None Blood Sugars averaging:Not checking eye exam within last year: Yes  eating healthy, but only occassional exercise  Elevated Cholesterol: well controlled LDL <100 Using medications without problems: Yes on zocor 40 mg daily and fish oil Muscle aches:  Other complaints:  Hypertension: Slightly above goal 130/80 today with BP at 132/84 Better though than at last OV (sys 160) with addition of losartan. No SE to this medicaiton. Using medication without problems or lightheadedness:  Yes, On cozaar, toprol XL and diltiazem Chest pain with exertion: None Edema:None Short of breath:None Average home BJY:[id number].. Will follow more closely. Other issues:  Has noted increase in belly fat. Sister and grandmother with ovarian cancer... She has had hysterectomy  No bloating, occ constipation but improved with fiber.   Review of Systems  Constitutional: Negative for fever and fatigue.  HENT: Negative for ear pain.   Eyes: Negative for pain.  Respiratory: Negative for chest tightness and shortness of breath.   Cardiovascular: Negative for chest pain, palpitations and leg swelling.  Gastrointestinal: Negative for abdominal pain.  Genitourinary: Negative for dysuria.       Objective:   Physical Exam  Constitutional: Vital signs are normal. She appears well-developed and well-nourished. She is cooperative.  Non-toxic appearance. She does not appear ill. No distress.  HENT:  Head: Normocephalic.  Right Ear: Hearing, tympanic membrane, external ear and ear canal normal. Tympanic membrane is not erythematous, not retracted and not bulging.    Left Ear: Hearing, tympanic membrane, external ear and ear canal normal. Tympanic membrane is not erythematous, not retracted and not bulging.  Nose: No mucosal edema or rhinorrhea. Right sinus exhibits no maxillary sinus tenderness and no frontal sinus tenderness. Left sinus exhibits no maxillary sinus tenderness and no frontal sinus tenderness.  Mouth/Throat: Uvula is midline, oropharynx is clear and moist and mucous membranes are normal.  Eyes: Conjunctivae, EOM and lids are normal. Pupils are equal, round, and reactive to light. No foreign bodies found.  Neck: Trachea normal and normal range of motion. Neck supple. Carotid bruit is not present. No mass and no thyromegaly present.  Cardiovascular: Normal rate, regular rhythm, S1 normal, S2 normal, normal heart sounds, intact distal pulses and normal pulses.  Exam reveals no gallop and no friction rub.   No murmur heard. Pulmonary/Chest: Effort normal and breath sounds normal. Not tachypneic. No respiratory distress. She has no decreased breath sounds. She has no wheezes. She has no rhonchi. She has no rales.  Abdominal: Soft. Normal appearance and bowel sounds are normal. She exhibits no mass. There is no hepatosplenomegaly. There is no tenderness.  Neurological: She is alert.  Skin: Skin is warm, dry and intact. No rash noted.  Psychiatric: Her speech is normal and behavior is normal. Judgment and thought content normal. Her mood appears not anxious. Cognition and memory are normal. She does not exhibit a depressed mood.      Diabetic foot exam: Normal inspection No skin breakdown No calluses  Normal DP pulses Normal sensation to light touch and monofilament Nails normal     Assessment & Plan:

## 2010-11-25 NOTE — Assessment & Plan Note (Signed)
Well controlled. Continue current medication, zocor 40

## 2010-11-25 NOTE — Assessment & Plan Note (Signed)
INcrease fiber in diet and exercise.  Abdominal girth increase on exam feels mainly like fat... Given pt family risk consider CA125 with next set of labs despite fact pt has had a hysterectomy. Pt concern level high.

## 2010-11-25 NOTE — Assessment & Plan Note (Signed)
Improved, but slightly elevated today. Follow BPs at home, call if above goal 130/80. Continue current med.

## 2010-12-01 ENCOUNTER — Encounter: Payer: Self-pay | Admitting: Family Medicine

## 2010-12-13 ENCOUNTER — Other Ambulatory Visit: Payer: Self-pay | Admitting: *Deleted

## 2010-12-13 MED ORDER — METOPROLOL SUCCINATE ER 50 MG PO TB24: 50.0000 mg | ORAL_TABLET | Freq: Every day | ORAL | Status: DC

## 2011-04-13 ENCOUNTER — Ambulatory Visit (INDEPENDENT_AMBULATORY_CARE_PROVIDER_SITE_OTHER): Payer: Medicare HMO | Admitting: Family Medicine

## 2011-04-13 ENCOUNTER — Encounter: Payer: Self-pay | Admitting: Family Medicine

## 2011-04-13 DIAGNOSIS — J069 Acute upper respiratory infection, unspecified: Secondary | ICD-10-CM | POA: Insufficient documentation

## 2011-04-13 NOTE — Assessment & Plan Note (Signed)
See instructions

## 2011-04-13 NOTE — Patient Instructions (Signed)
Use heat in the AM and 2 more times during the day, then ice at night on the right posterior neck. Take Tyl 500mg  2 tabs three times a day for at least one week. Take Guaifenesin (400mg ), take 11/2 tabs by mouth AM and NOON. Get GUAIFENESIN by  going to [facility] or [facility] and getting MUCOUS RELIEF EXPECTORANT/CONGESTION. DO NOT GET MUCINEX (Timed Release Guaifenesin)  Take Zyrtec at night for the next week.

## 2011-04-13 NOTE — Progress Notes (Signed)
  Subjective:    Patient ID: Janet Chapman, female    DOB: [birth year], 76 y.o.   MRN: [id number]  HPI Pt of Dr Daphine Deutscher here as acute walkin for heavy chest, sinus drainage L ear pain and R stiff neck. She is stiuffes up and has stiff neck with left ear pain and reaction to allergens in stores when walking in for the last three days. She has had congestive sxs for the last week. She denies fever or chills, she has lots of drainage, some headache globally with left ear pain, no ST, lots of rhinitis with clear discharge, no cough, no N/V, some diarrhea this AM, mild SOB, has never had pneumonia and does not have asthma. She has taken tylenol and her regular medications. She is on nothing for allergies, she took one allerclear a few nights ago.      Review of SystemsNoncontributory except as above.       Objective:   Physical Exam  Constitutional: She appears well-developed and well-nourished. No distress.  HENT:  Head: Normocephalic and atraumatic.  Right Ear: External ear normal.  Left Ear: External ear normal.  Nose: Nose normal.  Mouth/Throat: Oropharynx is clear and moist. No oropharyngeal exudate.  Eyes: Conjunctivae and EOM are normal. Pupils are equal, round, and reactive to light.  Neck: Normal range of motion. Neck supple. No thyromegaly present.  Cardiovascular: Normal rate, regular rhythm and normal heart sounds.   Pulmonary/Chest: Effort normal and breath sounds normal. She has no wheezes. She has no rales.  Lymphadenopathy:    She has no cervical adenopathy.  Skin: She is not diaphoretic.          Assessment & Plan:

## 2011-05-10 ENCOUNTER — Other Ambulatory Visit: Payer: Self-pay | Admitting: *Deleted

## 2011-05-10 MED ORDER — OMEPRAZOLE 20 MG PO CPDR: 20.0000 mg | DELAYED_RELEASE_CAPSULE | Freq: Every day | ORAL | Status: DC

## 2011-05-12 ENCOUNTER — Ambulatory Visit (INDEPENDENT_AMBULATORY_CARE_PROVIDER_SITE_OTHER): Payer: Medicare HMO | Admitting: Family Medicine

## 2011-05-12 ENCOUNTER — Encounter: Payer: Self-pay | Admitting: Family Medicine

## 2011-05-12 ENCOUNTER — Ambulatory Visit (INDEPENDENT_AMBULATORY_CARE_PROVIDER_SITE_OTHER)
Admission: RE | Admit: 2011-05-12 | Discharge: 2011-05-12 | Disposition: A | Discharge status: Home / Self Care | Payer: Medicare HMO | Source: Ambulatory Visit | Attending: Family Medicine | Admitting: Family Medicine

## 2011-05-12 VITALS — BP 120/78 | HR 57 | Temp 98.7°F | Ht 62.5 in | Wt 170.8 lb

## 2011-05-12 DIAGNOSIS — M542 Cervicalgia: Secondary | ICD-10-CM

## 2011-05-12 DIAGNOSIS — J019 Acute sinusitis, unspecified: Secondary | ICD-10-CM

## 2011-05-12 MED ORDER — DICLOFENAC SODIUM 75 MG PO TBEC: 75.0000 mg | DELAYED_RELEASE_TABLET | Freq: Two times a day (BID) | ORAL | Status: DC

## 2011-05-12 MED ORDER — AMOXICILLIN 500 MG PO CAPS: 1000.0000 mg | ORAL_CAPSULE | Freq: Two times a day (BID) | ORAL | Status: AC

## 2011-05-12 NOTE — Assessment & Plan Note (Signed)
Start NSAIDs.. Diclofenac. Heat, stretching exercises given.  Eval with X-ray given age.  Follow up in 2 weeks if not improving... To consider referral.

## 2011-05-12 NOTE — Assessment & Plan Note (Signed)
Given length of time with symptoms.. Will treat for bacterial infection with amox x 10 days. Also add allegra and nasal saline irrigation.

## 2011-05-12 NOTE — Patient Instructions (Addendum)
Start anti inflammatory diclofenac 75 mg twice daily.  Heat on neck, continue home physical therapy. Follow up in 2 weeks if not improving, call sooner if issues worsening. We will call you with X-ray results.  For sinuses: Add nasal saline 3-4 times a day. Start antibiotics, complete course. Can try allegra for allergies.
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
## 2011-05-26 ENCOUNTER — Ambulatory Visit: Payer: Medicare HMO | Admitting: Family Medicine

## 2011-05-31 ENCOUNTER — Other Ambulatory Visit (INDEPENDENT_AMBULATORY_CARE_PROVIDER_SITE_OTHER): Payer: Medicare HMO

## 2011-05-31 DIAGNOSIS — E785 Hyperlipidemia, unspecified: Secondary | ICD-10-CM

## 2011-05-31 DIAGNOSIS — E119 Type 2 diabetes mellitus without complications: Secondary | ICD-10-CM

## 2011-05-31 LAB — COMPREHENSIVE METABOLIC PANEL
ALT: 44 U/L — ABNORMAL HIGH (ref 0–35)
AST: 32 U/L (ref 0–37)
Albumin: 4.3 g/dL (ref 3.5–5.2)
Alkaline Phosphatase: 70 U/L (ref 39–117)
BUN: 15 mg/dL (ref 6–23)
CO2: 31 mEq/L (ref 19–32)
Calcium: 9.5 mg/dL (ref 8.4–10.5)
Chloride: 103 mEq/L (ref 96–112)
Creatinine, Ser: 0.7 mg/dL (ref 0.4–1.2)
GFR: 82.96 mL/min (ref >60.00)
Glucose, Bld: 101 mg/dL — ABNORMAL HIGH (ref 70–99)
Potassium: 4.4 mEq/L (ref 3.5–5.1)
Sodium: 142 mEq/L (ref 135–145)
Total Bilirubin: 0.7 mg/dL (ref 0.3–1.2)
Total Protein: 6.8 g/dL (ref 6.0–8.3)

## 2011-05-31 LAB — LIPID PANEL
Cholesterol: 164 mg/dL (ref 0–200)
HDL: 59.2 mg/dL (ref >39.00)
LDL Cholesterol: 89 mg/dL (ref 0–99)
Total CHOL/HDL Ratio: 3
Triglycerides: 78 mg/dL (ref 0.0–149.0)
VLDL: 15.6 mg/dL (ref 0.0–40.0)

## 2011-05-31 LAB — HEMOGLOBIN A1C: Hgb A1c MFr Bld: 6.1 % (ref 4.6–6.5)

## 2011-06-02 ENCOUNTER — Encounter: Payer: Medicare HMO | Admitting: Family Medicine

## 2011-06-14 ENCOUNTER — Ambulatory Visit (INDEPENDENT_AMBULATORY_CARE_PROVIDER_SITE_OTHER): Payer: Medicare HMO | Admitting: Family Medicine

## 2011-06-14 ENCOUNTER — Encounter: Payer: Self-pay | Admitting: Family Medicine

## 2011-06-14 VITALS — BP 120/72 | HR 60 | Temp 97.8°F | Ht 62.5 in | Wt 167.8 lb

## 2011-06-14 DIAGNOSIS — B373 Candidiasis of vulva and vagina: Secondary | ICD-10-CM

## 2011-06-14 DIAGNOSIS — E119 Type 2 diabetes mellitus without complications: Secondary | ICD-10-CM

## 2011-06-14 DIAGNOSIS — E785 Hyperlipidemia, unspecified: Secondary | ICD-10-CM

## 2011-06-14 MED ORDER — FLUCONAZOLE 150 MG PO TABS: 150.0000 mg | ORAL_TABLET | Freq: Once | ORAL | Status: AC

## 2011-06-14 NOTE — Progress Notes (Signed)
  Patient Name: Janet Chapman Date of Birth: Jan 01, 1936 Age: 76 y.o. Medical Record Number: [id number] Gender: female Date of Encounter: 06/14/2011  History of Present Illness:  Janet Chapman is a 76 y.o. very pleasant female patient who presents with the following:  The patient is here primarily to discuss her vaginal itching and discharge has been ongoing for the last 2-3 weeks. She has tried multiple topical over-the-counter remedies without any success. She thinks she probably has a yeast infection. She takes antibiotics prior to the onset of the symptoms.  She also wanted to review her diabetes and high cholesterol laboratories. She is doing well. Following her diet, but did have some slip ups during Christmas.  Basic Metabolic Panel:    Component Value Date/Time   NA 142 05/31/2011 0844   K 4.4 05/31/2011 0844   CL 103 05/31/2011 0844   CO2 31 05/31/2011 0844   BUN 15 05/31/2011 0844   CREATININE 0.7 05/31/2011 0844   GLUCOSE 101* 05/31/2011 0844   CALCIUM 9.5 05/31/2011 0844   Lipids:    Component Value Date/Time   CHOL 164 05/31/2011 0844   TRIG 78.0 05/31/2011 0844   HDL 59.20 05/31/2011 0844   LDLDIRECT 119.8 04/26/2009 0846   VLDL 15.6 05/31/2011 0844   CHOLHDL 3 05/31/2011 0844   Lab Results  Component Value Date   HGBA1C 6.1 05/31/2011     Past Medical History, Surgical History, Social History, Family History, Problem List, Medications, and Allergies have been reviewed and updated if relevant.  Review of Systems:  GEN: No acute illnesses, no fevers, chills. GI: No n/v/d, eating normally Pulm: No SOB Interactive and getting along well at home.  Otherwise, ROS is as per the HPI.   Physical Examination: Filed Vitals:   06/14/11 0819  BP: 120/72  Pulse: 60  Temp: 97.8 F (36.6 C)  TempSrc: Oral  Height: 5' 2.5" (1.588 m)  Weight: 167 lb 12.8 oz (76.114 kg)  SpO2: 98%    Body mass index is 30.20 kg/(m^2).   GEN: WDWN, NAD, Non-toxic, Alert & Oriented x  3 HEENT: Atraumatic, Normocephalic.  Ears and Nose: No external deformity. EXTR: No clubbing/cyanosis/edema NEURO: Normal gait.  PSYCH: Normally interactive. Conversant. Not depressed or anxious appearing.  Calm demeanor.  GU: externally, red, irritated. Macular rash with well demarcated line.  Assessment and Plan: 1. Yeast infection of the vagina  fluconazole (DIFLUCAN) 150 MG tablet  2. DIABETES MELLITUS, TYPE II    3. HYPERLIPIDEMIA      cllotrimazole ext  Doing well with dm and lipid management. All labs reviewed

## 2011-06-14 NOTE — Patient Instructions (Signed)
Clotrimazole externally - twice a day

## 2011-06-19 ENCOUNTER — Other Ambulatory Visit: Payer: Self-pay | Admitting: *Deleted

## 2011-06-19 MED ORDER — SIMVASTATIN 40 MG PO TABS: 40.0000 mg | ORAL_TABLET | Freq: Every day | ORAL | Status: DC

## 2011-08-16 ENCOUNTER — Other Ambulatory Visit: Payer: Self-pay | Admitting: *Deleted

## 2011-08-16 MED ORDER — LOSARTAN POTASSIUM 25 MG PO TABS: ORAL_TABLET | ORAL | Status: DC

## 2011-08-17 ENCOUNTER — Encounter: Payer: Self-pay | Admitting: Family Medicine

## 2011-09-20 ENCOUNTER — Ambulatory Visit (INDEPENDENT_AMBULATORY_CARE_PROVIDER_SITE_OTHER): Payer: Medicare HMO | Admitting: Family Medicine

## 2011-09-20 ENCOUNTER — Encounter: Payer: Self-pay | Admitting: Family Medicine

## 2011-09-20 VITALS — BP 120/78 | HR 58 | Temp 98.1°F | Ht 62.5 in | Wt 171.1 lb

## 2011-09-20 DIAGNOSIS — L259 Unspecified contact dermatitis, unspecified cause: Secondary | ICD-10-CM

## 2011-09-20 DIAGNOSIS — L239 Allergic contact dermatitis, unspecified cause: Secondary | ICD-10-CM

## 2011-09-20 MED ORDER — TRIAMCINOLONE ACETONIDE 0.1 % EX CREA: TOPICAL_CREAM | Freq: Two times a day (BID) | CUTANEOUS | Status: DC

## 2011-09-21 NOTE — Progress Notes (Signed)
  Patient Name: Janet Chapman Date of Birth: 1935-05-26 Age: 76 y.o. Medical Record Number: [id number] Gender: female Date of Encounter: 09/20/2011  History of Present Illness:  Janet Chapman is a 76 y.o. very pleasant female patient who presents with the following:  Pleasant lady who developed some red rash, on both ankles, and a few red spots on bilateral lower extremities. She has been out signed and in the pine needles and working in the garden and yard a fair amount. No known topical exposures and she can recall. No new products being used. She tried to put some Polysporin on it with minimal relief. No fevers or warmth.  Past Medical History, Surgical History, Social History, Family History, Problem List, Medications, and Allergies have been reviewed and updated if relevant.  Review of Systems:  GEN: No acute illnesses, no fevers, chills. GI: No n/v/d, eating normally Pulm: No SOB Interactive and getting along well at home.  Otherwise, ROS is as per the HPI.   Physical Examination: Filed Vitals:   09/20/11 1541  BP: 120/78  Pulse: 58  Temp: 98.1 F (36.7 C)  TempSrc: Oral  Height: 5' 2.5" (1.588 m)  Weight: 171 lb 1.9 oz (77.62 kg)  SpO2: 96%    Body mass index is 30.80 kg/(m^2).   GEN: WDWN, NAD, Non-toxic, Alert & Oriented x 3 HEENT: Atraumatic, Normocephalic.  Ears and Nose: No external deformity. EXTR: No clubbing/cyanosis/edema NEURO: Normal gait.  PSYCH: Normally interactive. Conversant. Not depressed or anxious appearing.  Calm demeanor.  SKIN: flat reddish rash, small amount on bilateral ankles and few scattered reddish lesions on the lower extremities  Assessment and Plan:  1. Allergic dermatitis    Topical meds bid Reassured patient, suspect from exposure from outside.  Orders Today: No orders of the defined types were placed in this encounter.    Medications Today: Meds ordered this encounter  Medications  . triamcinolone cream (KENALOG)  0.1 %    Sig: Apply topically 2 (two) times daily.    Dispense:  454 g    Refill:  0

## 2011-10-30 LAB — HM MAMMOGRAPHY: HM Mammogram: NORMAL

## 2011-10-31 ENCOUNTER — Encounter: Payer: Self-pay | Admitting: Family Medicine

## 2011-10-31 ENCOUNTER — Other Ambulatory Visit: Payer: Self-pay | Admitting: *Deleted

## 2011-10-31 MED ORDER — DILTIAZEM HCL 120 MG PO TABS: 120.0000 mg | ORAL_TABLET | Freq: Every day | ORAL | Status: DC

## 2011-11-01 ENCOUNTER — Encounter: Payer: Self-pay | Admitting: *Deleted

## 2011-12-06 ENCOUNTER — Telehealth: Payer: Self-pay | Admitting: Family Medicine

## 2011-12-06 DIAGNOSIS — E119 Type 2 diabetes mellitus without complications: Secondary | ICD-10-CM

## 2011-12-06 DIAGNOSIS — E785 Hyperlipidemia, unspecified: Secondary | ICD-10-CM

## 2011-12-06 NOTE — Telephone Encounter (Signed)
Message copied by Excell Seltzer on Wed Dec 06, 2011  9:00 PM ------      Message from: Alvina Chou      Created: Wed Dec 06, 2011  3:52 PM      Regarding: Labs for Thursday morning, 7.25       Labs for F/U

## 2011-12-08 ENCOUNTER — Other Ambulatory Visit (INDEPENDENT_AMBULATORY_CARE_PROVIDER_SITE_OTHER): Payer: Medicare HMO

## 2011-12-08 DIAGNOSIS — E785 Hyperlipidemia, unspecified: Secondary | ICD-10-CM

## 2011-12-08 DIAGNOSIS — E119 Type 2 diabetes mellitus without complications: Secondary | ICD-10-CM

## 2011-12-08 LAB — COMPREHENSIVE METABOLIC PANEL
ALT: 18 U/L (ref 0–35)
AST: 23 U/L (ref 0–37)
Albumin: 4.2 g/dL (ref 3.5–5.2)
Alkaline Phosphatase: 75 U/L (ref 39–117)
BUN: 24 mg/dL — ABNORMAL HIGH (ref 6–23)
CO2: 31 mEq/L (ref 19–32)
Calcium: 9.7 mg/dL (ref 8.4–10.5)
Chloride: 102 mEq/L (ref 96–112)
Creatinine, Ser: 0.8 mg/dL (ref 0.4–1.2)
GFR: 75.62 mL/min (ref >60.00)
Glucose, Bld: 89 mg/dL (ref 70–99)
Potassium: 4.2 mEq/L (ref 3.5–5.1)
Sodium: 141 mEq/L (ref 135–145)
Total Bilirubin: 0.6 mg/dL (ref 0.3–1.2)
Total Protein: 6.9 g/dL (ref 6.0–8.3)

## 2011-12-08 LAB — LIPID PANEL
Cholesterol: 168 mg/dL (ref 0–200)
HDL: 65 mg/dL (ref >39.00)
LDL Cholesterol: 92 mg/dL (ref 0–99)
Total CHOL/HDL Ratio: 3
Triglycerides: 54 mg/dL (ref 0.0–149.0)
VLDL: 10.8 mg/dL (ref 0.0–40.0)

## 2011-12-08 LAB — HEMOGLOBIN A1C: Hgb A1c MFr Bld: 6.1 % (ref 4.6–6.5)

## 2011-12-11 ENCOUNTER — Other Ambulatory Visit: Payer: Self-pay | Admitting: *Deleted

## 2011-12-11 MED ORDER — METOPROLOL SUCCINATE ER 50 MG PO TB24: 50.0000 mg | ORAL_TABLET | Freq: Every day | ORAL | Status: DC

## 2011-12-14 ENCOUNTER — Ambulatory Visit (INDEPENDENT_AMBULATORY_CARE_PROVIDER_SITE_OTHER): Payer: Medicare Other | Admitting: Family Medicine

## 2011-12-14 ENCOUNTER — Encounter: Payer: Self-pay | Admitting: Family Medicine

## 2011-12-14 VITALS — BP 120/72 | HR 90 | Temp 98.4°F | Ht 62.5 in | Wt 169.5 lb

## 2011-12-14 DIAGNOSIS — I10 Essential (primary) hypertension: Secondary | ICD-10-CM

## 2011-12-14 DIAGNOSIS — K5900 Constipation, unspecified: Secondary | ICD-10-CM

## 2011-12-14 DIAGNOSIS — E119 Type 2 diabetes mellitus without complications: Secondary | ICD-10-CM

## 2011-12-14 DIAGNOSIS — E785 Hyperlipidemia, unspecified: Secondary | ICD-10-CM

## 2011-12-14 LAB — TSH: TSH: 1.75 u[IU]/mL (ref 0.35–5.50)

## 2011-12-14 NOTE — Patient Instructions (Signed)
Can try topical vit E on nails.

## 2011-12-14 NOTE — Assessment & Plan Note (Signed)
Well controlled on no med. Encouraged exercise, weight loss, healthy eating habits.   

## 2011-12-14 NOTE — Progress Notes (Signed)
Subjective:    Patient ID: Janet Chapman, female    DOB: Oct 25, 1935, 76 y.o.   MRN: [id number]  HPI  76 year old female presents for follow up.  Diabetes: Well controlled with lifestyle.  Lab Results  Component Value Date   HGBA1C 6.1 12/08/2011   Using medications without difficulties: Yes  Hypoglycemic episodes: two to three episodes but has not measured.  Hyperglycemic episodes:None  Feet problems: None  Blood Sugars averaging:Not checking  eye exam within last year: Yes  eating healthy, but only occassional exercise   Elevated Cholesterol: well controlled LDL <100  Lab Results  Component Value Date   CHOL 168 12/08/2011   HDL 65.00 12/08/2011   LDLCALC 92 12/08/2011   LDLDIRECT 119.8 04/26/2009   TRIG 54.0 12/08/2011   CHOLHDL 3 12/08/2011    Using medications without problems: Yes on zocor 40 mg daily and fish oil  Muscle aches:  Other complaints:   Hypertension: Well controlled on current meds. Using medication without problems or lightheadedness: Yes, On cozaar, toprol XL and diltiazem  Chest pain with exertion: None  Edema:None  Short of breath:None  Average home BPs: Occassionally.. Will follow more closely.  Other issues:   Review of Systems  Constitutional: Negative for fever and fatigue.  HENT: Negative for ear pain.   Eyes: Negative for pain.  Respiratory: Negative for chest tightness and shortness of breath.   Cardiovascular: Negative for chest pain, palpitations and leg swelling.  Gastrointestinal: Negative for abdominal pain.  Genitourinary: Negative for dysuria.   Fingernails splitting in lat few months, never had this problem before.  Hot spells. Concerned about thyroid.  Some constipation and weight issues.  Occ fatigue.     Objective:   Physical Exam  Constitutional: Vital signs are normal. She appears well-developed and well-nourished. She is cooperative.  Non-toxic appearance. She does not appear ill. No distress.  HENT:  Head:  Normocephalic.  Right Ear: Hearing, tympanic membrane, external ear and ear canal normal. Tympanic membrane is not erythematous, not retracted and not bulging.  Left Ear: Hearing, tympanic membrane, external ear and ear canal normal. Tympanic membrane is not erythematous, not retracted and not bulging.  Nose: No mucosal edema or rhinorrhea. Right sinus exhibits no maxillary sinus tenderness and no frontal sinus tenderness. Left sinus exhibits no maxillary sinus tenderness and no frontal sinus tenderness.  Mouth/Throat: Uvula is midline, oropharynx is clear and moist and mucous membranes are normal.  Eyes: Conjunctivae, EOM and lids are normal. Pupils are equal, round, and reactive to light. No foreign bodies found.  Neck: Trachea normal and normal range of motion. Neck supple. Carotid bruit is not present. No mass and no thyromegaly present.  Cardiovascular: Normal rate, regular rhythm, S1 normal, S2 normal, normal heart sounds, intact distal pulses and normal pulses.  Exam reveals no gallop and no friction rub.   No murmur heard. Pulmonary/Chest: Effort normal and breath sounds normal. Not tachypneic. No respiratory distress. She has no decreased breath sounds. She has no wheezes. She has no rhonchi. She has no rales.  Abdominal: Soft. Normal appearance and bowel sounds are normal. There is no tenderness.  Neurological: She is alert.  Skin: Skin is warm, dry and intact. No rash noted.  Psychiatric: Her speech is normal and behavior is normal. Judgment and thought content normal. Her mood appears not anxious. Cognition and memory are normal. She does not exhibit a depressed mood.     Diabetic foot exam: Normal inspection No skin breakdown No calluses  Normal DP pulses Normal sensation to light touch and monofilament Nails thickened an fungus. Using antifungal that is helping.      Assessment & Plan:

## 2011-12-14 NOTE — Assessment & Plan Note (Signed)
Well-controlled on simvastatin 40 mg daily

## 2011-12-14 NOTE — Assessment & Plan Note (Signed)
Well controlled. Continue current medication.  

## 2011-12-18 ENCOUNTER — Encounter: Payer: Self-pay | Admitting: *Deleted

## 2012-02-21 ENCOUNTER — Encounter: Payer: Self-pay | Admitting: Family Medicine

## 2012-02-21 ENCOUNTER — Ambulatory Visit (INDEPENDENT_AMBULATORY_CARE_PROVIDER_SITE_OTHER): Payer: Medicare Other | Admitting: Family Medicine

## 2012-02-21 VITALS — BP 142/82 | HR 72 | Temp 98.6°F | Wt 167.0 lb

## 2012-02-21 DIAGNOSIS — B353 Tinea pedis: Secondary | ICD-10-CM

## 2012-02-21 DIAGNOSIS — Z23 Encounter for immunization: Secondary | ICD-10-CM

## 2012-02-21 MED ORDER — MICONAZOLE NITRATE 2 % EX CREA: TOPICAL_CREAM | Freq: Two times a day (BID) | CUTANEOUS | Status: DC

## 2012-02-21 NOTE — Progress Notes (Signed)
Nature conservation officer at [facility] [address] [place] [address] Phone: [phone] Fax: [phone]  Date:  02/21/2012   Name:  Janet Chapman   DOB:  1936/03/16   MRN:  [id number] Gender: female Age: 76 y.o.  PCP:  Kerby Nora, MD    Chief Complaint: Red spots on right foot, some itching and x 1-2 months   History of Present Illness:  Janet Chapman is a 76 y.o. pleasant patient who presents with the following:  The patient has some red, itching spots on her feet have been present for last 1-2 months. She tried putting some triamcinolone cream on it, which did seem to somewhat, but it has not helped, and this rash is that looked actually spread. They are also between the toes.  Patient Active Problem List  Diagnosis  . DIABETES MELLITUS, TYPE II  . HYPERLIPIDEMIA  . OBESITY  . INSOMNIA, CHRONIC  . HYPERTENSION  . ALLERGIC RHINITIS  . GERD  . CONSTIPATION  . ACTINIC KERATOSIS  . SEBORRHEIC KERATOSIS  . OSTEOARTHRITIS  . URINARY INCONTINENCE  . MAMMOGRAM, ABNORMAL, LEFT    Past Medical History  Diagnosis Date  . Hyperlipidemia   . Hypertension   . Urinary incontinence   . Allergic rhinitis   . GERD (gastroesophageal reflux disease)   . Osteoarthritis     Past Surgical History  Procedure Date  . Cardiovascular stress test 2004    H. Smith, cardiolite-normal  . Cholecystectomy [phone]  . Abdominal hysterectomy 1993    TA    History  Substance Use Topics  . Smoking status: Never Smoker   . Smokeless tobacco: Never Used  . Alcohol Use: No    Family History  Problem Relation Age of Onset  . Heart attack Father 73  . Dementia Mother   . Stroke Mother 55  . Ovarian cancer Sister 61  . Diabetes      Maternal side  . Ovarian cancer      Grandmother  . Ovarian cancer      aunt    Allergies  Allergen Reactions  . Codeine     REACTION: nausea    Medication list has been reviewed and updated.  Outpatient Prescriptions Prior to  Visit  Medication Sig Dispense Refill  . aspirin 81 MG tablet Take 81 mg by mouth daily.        . Calcium Carbonate-Vitamin D (CALCIUM 600+D) 600-200 MG-UNIT TABS Take 1 tablet by mouth daily.        . Coenzyme Q10 150 MG CAPS Take 1 capsule by mouth daily.        Marland Kitchen diltiazem (CARDIZEM) 120 MG tablet Take 1 tablet (120 mg total) by mouth daily.  90 tablet  1  . fish oil-omega-3 fatty acids 1000 MG capsule Take 2 g by mouth daily.        Marland Kitchen glucose blood test strip Use as instructed to check blood sugar once a day       . losartan (COZAAR) 25 MG tablet Daily  90 tablet  3  . Magnesium 250 MG TABS Take 1 tablet by mouth daily.        . metoprolol succinate (TOPROL-XL) 50 MG 24 hr tablet Take 1 tablet (50 mg total) by mouth daily.  90 tablet  3  . Multiple Vitamin (MULTIVITAMIN) tablet Take 1 tablet by mouth daily.        Marland Kitchen omeprazole (PRILOSEC) 20 MG capsule Take 1 capsule (20 mg total)  by mouth daily.  90 capsule  3  . simvastatin (ZOCOR) 40 MG tablet Take 1 tablet (40 mg total) by mouth at bedtime.  90 tablet  3  . triamcinolone cream (KENALOG) 0.1 % Apply topically 2 (two) times daily.  454 g  0    Review of Systems:   GEN: No acute illnesses, no fevers, chills. GI: No n/v/d, eating normally Pulm: No SOB Interactive and getting along well at home.  Otherwise, ROS is as per the HPI.   Physical Examination: Filed Vitals:   02/21/12 1455  BP: 142/82  Pulse: 72  Temp: 98.6 F (37 C)   Filed Vitals:   02/21/12 1455  Weight: 167 lb (75.751 kg)   There is no height on file to calculate BMI. Ideal Body Weight:     GEN: WDWN, NAD, Non-toxic, Alert & Oriented x 3 HEENT: Atraumatic, Normocephalic.  Ears and Nose: No external deformity. EXTR: No clubbing/cyanosis/edema. SKIN: areas of red, flaking skin notable between the toes. There also some areas on the posterior of the heel as well. NEURO: Normal gait.  PSYCH: Normally interactive. Conversant. Not depressed or anxious  appearing.  Calm demeanor.    Assessment and Plan:  1. Tinea pedis    2. Need for prophylactic vaccination and inoculation against influenza  Flu vaccine greater than or equal to 3yo preservative free IM    Orders Today:  Orders Placed This Encounter  Procedures  . Flu vaccine greater than or equal to 3yo preservative free IM    Updated Medication List: (Includes new medications, updates to list, dose adjustments) Meds ordered this encounter  Medications  . miconazole (MICOTIN) 2 % cream    Sig: Apply topically 2 (two) times daily.    Dispense:  28.35 g    Refill:  0    Medications Discontinued: There are no discontinued medications.   Hannah Beat, MD

## 2012-05-01 ENCOUNTER — Other Ambulatory Visit: Payer: Self-pay | Admitting: *Deleted

## 2012-05-01 MED ORDER — DILTIAZEM HCL 120 MG PO TABS: 120.0000 mg | ORAL_TABLET | Freq: Every day | ORAL | Status: DC

## 2012-05-14 ENCOUNTER — Other Ambulatory Visit: Payer: Self-pay | Admitting: *Deleted

## 2012-05-14 MED ORDER — OMEPRAZOLE 20 MG PO CPDR: 20.0000 mg | DELAYED_RELEASE_CAPSULE | Freq: Every day | ORAL | Status: DC

## 2012-06-13 ENCOUNTER — Telehealth: Payer: Self-pay | Admitting: Family Medicine

## 2012-06-13 DIAGNOSIS — E119 Type 2 diabetes mellitus without complications: Secondary | ICD-10-CM

## 2012-06-13 DIAGNOSIS — E785 Hyperlipidemia, unspecified: Secondary | ICD-10-CM

## 2012-06-13 DIAGNOSIS — I10 Essential (primary) hypertension: Secondary | ICD-10-CM

## 2012-06-13 NOTE — Telephone Encounter (Signed)
Message copied by Excell Seltzer on Thu Jun 13, 2012  1:57 PM ------      Message from: Baldomero Lamy      Created: Fri Jun 07, 2012  1:23 PM      Regarding: Cpx labs Mon 06/17/12       Please order  future cpx labs for pt's upcoming lab appt.      Thanks      Rodney Booze

## 2012-06-14 ENCOUNTER — Other Ambulatory Visit: Payer: Self-pay | Admitting: *Deleted

## 2012-06-14 MED ORDER — SIMVASTATIN 40 MG PO TABS: 40.0000 mg | ORAL_TABLET | Freq: Every day | ORAL | Status: DC

## 2012-06-17 ENCOUNTER — Other Ambulatory Visit (INDEPENDENT_AMBULATORY_CARE_PROVIDER_SITE_OTHER): Payer: Medicare Other

## 2012-06-17 DIAGNOSIS — E119 Type 2 diabetes mellitus without complications: Secondary | ICD-10-CM

## 2012-06-17 DIAGNOSIS — E785 Hyperlipidemia, unspecified: Secondary | ICD-10-CM

## 2012-06-17 LAB — LIPID PANEL
Cholesterol: 158 mg/dL (ref 0–200)
HDL: 60.9 mg/dL (ref >39.00)
LDL Cholesterol: 85 mg/dL (ref 0–99)
Total CHOL/HDL Ratio: 3
Triglycerides: 60 mg/dL (ref 0.0–149.0)
VLDL: 12 mg/dL (ref 0.0–40.0)

## 2012-06-17 LAB — COMPREHENSIVE METABOLIC PANEL
ALT: 18 U/L (ref 0–35)
AST: 22 U/L (ref 0–37)
Albumin: 4.3 g/dL (ref 3.5–5.2)
Alkaline Phosphatase: 78 U/L (ref 39–117)
BUN: 19 mg/dL (ref 6–23)
CO2: 34 mEq/L — ABNORMAL HIGH (ref 19–32)
Calcium: 9.6 mg/dL (ref 8.4–10.5)
Chloride: 105 mEq/L (ref 96–112)
Creatinine, Ser: 0.8 mg/dL (ref 0.4–1.2)
GFR: 78.96 mL/min (ref >60.00)
Glucose, Bld: 104 mg/dL — ABNORMAL HIGH (ref 70–99)
Potassium: 4.5 mEq/L (ref 3.5–5.1)
Sodium: 143 mEq/L (ref 135–145)
Total Bilirubin: 0.6 mg/dL (ref 0.3–1.2)
Total Protein: 7.1 g/dL (ref 6.0–8.3)

## 2012-06-17 LAB — HEMOGLOBIN A1C: Hgb A1c MFr Bld: 6.3 % (ref 4.6–6.5)

## 2012-06-21 ENCOUNTER — Encounter: Payer: Self-pay | Admitting: Family Medicine

## 2012-06-21 ENCOUNTER — Ambulatory Visit (INDEPENDENT_AMBULATORY_CARE_PROVIDER_SITE_OTHER): Payer: Medicare Other | Admitting: Family Medicine

## 2012-06-21 VITALS — BP 120/88 | HR 59 | Temp 98.1°F | Ht 62.5 in

## 2012-06-21 DIAGNOSIS — E785 Hyperlipidemia, unspecified: Secondary | ICD-10-CM

## 2012-06-21 DIAGNOSIS — I10 Essential (primary) hypertension: Secondary | ICD-10-CM

## 2012-06-21 DIAGNOSIS — E119 Type 2 diabetes mellitus without complications: Secondary | ICD-10-CM

## 2012-06-21 DIAGNOSIS — Z0000 Encounter for general adult medical examination without abnormal findings: Secondary | ICD-10-CM

## 2012-06-21 NOTE — Addendum Note (Signed)
Addended by: Consuello Masse on: 06/21/2012 11:08 AM   Modules accepted: Orders

## 2012-06-21 NOTE — Assessment & Plan Note (Signed)
Well controlled with diet Encouraged exercise, weight loss, healthy eating habits.   

## 2012-06-21 NOTE — Assessment & Plan Note (Signed)
Well controlled. Continue current medication.  

## 2012-06-21 NOTE — Patient Instructions (Addendum)
Work on exercise 3-5 ties a day.  Work on decreasing  Carbohydrates and cholesterol in diet.  Schedule mammogram in 10/2012

## 2012-06-21 NOTE — Assessment & Plan Note (Signed)
LDl at goal

## 2012-06-21 NOTE — Progress Notes (Signed)
I have personally reviewed the Medicare Annual Wellness questionnaire and have noted  1. The patient's medical and social history  2. Their use of alcohol, tobacco or illicit drugs  3. Their current medications and supplements  4. The patient's functional ability including ADL's, fall risks, home safety risks and hearing or visual  impairment.  5. Diet and physical activities  6. Evidence for depression or mood disorders  The patients weight, height, BMI and visual acuity have been recorded in the chart  I have made referrals, counseling and provided education to the patient based review of the above and I have provided the pt with a written personalized care plan for preventive services.    ZH:YQMV controlled. Does not check blood sugar at home.   Lab Results  Component Value Date   HGBA1C 6.3 06/17/2012   Working on lifestyle changes .Has not been doing as well with diet changes. Exercise: None  High cholesterol: good control on simvastatin 40 mg daily.  Lab Results  Component Value Date   CHOL 158 06/17/2012   HDL 60.90 06/17/2012   LDLCALC 85 06/17/2012   LDLDIRECT 119.8 04/26/2009   TRIG 60.0 06/17/2012   CHOLHDL 3 06/17/2012  No myalgia.   HTN:  Well controlled on diltiazem and metoprolol.  Saw Dr. Mariah Milling for palpitations in 2012... using metoprolol extra 50 mg rarely because pulse okay.  HAs not been back to see him. No CP with exertion. No SOB. No edema.  Diabetes Management History:  She says that she is exercising. Type of exercise includes: walk. Duration of exercise is estimated to be <30  Preventive Screening-Counseling & Management  Alcohol-Tobacco  Smoking Status: never  Caffeine-Diet-Exercise  Diet Comments: poor over holidays  Diet Counseling: to improve diet; diet is suboptimal  Nutrition Referrals: no  Does Patient Exercise: No Exercise Counseling: to improve exercise regimen   Past History:  Past medical, surgical, family and social histories (including risk  factors) reviewed, and no changes noted (except as noted below).   Past Medical History:  Reviewed history from 05/31/2007 and no changes required.  Hyperlipidemia  Hypertension  Urinary incontinence  Allergic rhinitis  GERD  Osteoarthritis  Past Surgical History:  Reviewed history from 05/28/2009 and no changes required.  Echo  Stress Test (H. Smith: Cardiolite in 2004. Normal)  DEXA (-) 2007  Cholecystectomy 1990's  TA Hysterectomy 1993  Family History:  Reviewed history from 05/31/2007 and no changes required.  Father: Died 54 MI  Mother: Died 78, dementia, CVA  Siblings: 1 sister, ovarian CA, age 15  DM: Maternal side  Ovarian CA: Sister, grandmother, great aunt  Social History:  Reviewed history from 05/31/2007 and no changes required.  Never Smoked  Alcohol use-no  Drug use-no  Regular exercise-yes, Curves 2x/week  Marital Status: Married x 48 years  Children: None  Occupation: Scientist, product/process development, works part-time as Asst. to [employer]  Diet: Fruit and veggies, no FF, some water, artificial sweetener  Does Patient Exercise: yes   Review of Systems  Insomnia.Marland Kitchen tried melatonin but not helping anymore.  General: Denies fatigue and fever.  CV: Denies chest pain or discomfort and swelling of feet.  Resp: Denies shortness of breath.  GI: Denies abdominal pain.  GU: Denies dysuria.  Has noted nails splitting in last year.  Physical Exam  General: Well-developed,well-nourished,in no acute distress; alert,appropriate and cooperative throughout examination  Ears: External ear exam shows no significant lesions or deformities. Otoscopic examination reveals clear canals, tympanic membranes are intact bilaterally  without bulging, retraction, inflammation or discharge. Hearing is grossly normal bilaterally.  Nose: External nasal examination shows no deformity or inflammation. Nasal mucosa are pink and moist without lesions or exudates.  Mouth: Oral mucosa and oropharynx  without lesions or exudates. Teeth in good repair.  Neck: no carotid bruit or thyromegaly no cervical or supraclavicular lymphadenopathy  Chest Wall: No deformities, masses, or tenderness noted.  Breasts: No mass, nodules, thickening, tenderness, bulging, retraction, inflamation, nipple discharge or skin changes noted.  Lungs: Normal respiratory effort, chest expands symmetrically. Lungs are clear to auscultation, no crackles or wheezes.  Heart: Normal rate and regular rhythm. S1 and S2 normal without gallop, murmur, click, rub or other extra sounds.  Abdomen: Bowel sounds positive,abdomen soft and non-tender without masses, organomegaly or hernias noted.  Genitalia: not indicated  Pulses: R and L posterior tibial pulses are full and equal bilaterally  Extremities: no edema  Neurologic: No cranial nerve deficits noted. Station and gait are normal. Plantar reflexes are down-going bilaterally. DTRs are symmetrical throughout. Sensory, motor and coordinative functions appear intact.  Skin: Intact without suspicious lesions or rashes  Psych: Cognition and judgment appear intact. Alert and cooperative with normal attention span and concentration. No apparent delusions, illusions, hallucinations   Diabetic foot exam: Normal inspection No skin breakdown No calluses  Normal DP pulses Normal sensation to light touch and monofilament Nails normal  A/P  The patient's preventative maintenance and recommended screening tests for an annual wellness exam were reviewed in full today. Brought up to date unless services declined.  Counselled on the importance of diet, exercise, and its role in overall health and mortality. The patient's FH and SH was reviewed, including their home life, tobacco status, and drug and alcohol status.   Vaccines:uptodate with flu, Td, shingles and PNA Mammo: 10/2011 DVE: papand DVE not indicated. TAH. Sister with ovarian cancer DEXA: last  nml 2012, repeat in 5  years Colon:  Thinks it was in last 10 years and was normal.. Will request records. Nonsmoker

## 2012-07-09 ENCOUNTER — Encounter: Payer: Self-pay | Admitting: Family Medicine

## 2012-07-18 ENCOUNTER — Encounter: Payer: Self-pay | Admitting: Family Medicine

## 2012-07-26 ENCOUNTER — Encounter: Payer: Self-pay | Admitting: Family Medicine

## 2012-07-26 DIAGNOSIS — M8580 Other specified disorders of bone density and structure, unspecified site: Secondary | ICD-10-CM | POA: Insufficient documentation

## 2012-08-09 ENCOUNTER — Other Ambulatory Visit: Payer: Self-pay | Admitting: *Deleted

## 2012-08-09 MED ORDER — LOSARTAN POTASSIUM 25 MG PO TABS: ORAL_TABLET | ORAL | Status: DC

## 2012-10-25 ENCOUNTER — Other Ambulatory Visit: Payer: Self-pay | Admitting: *Deleted

## 2012-10-25 MED ORDER — DILTIAZEM HCL 120 MG PO TABS: 120.0000 mg | ORAL_TABLET | Freq: Every day | ORAL | Status: DC

## 2012-11-19 LAB — HM DIABETES EYE EXAM

## 2012-12-10 ENCOUNTER — Other Ambulatory Visit: Payer: Self-pay | Admitting: *Deleted

## 2012-12-10 MED ORDER — SIMVASTATIN 40 MG PO TABS: 40.0000 mg | ORAL_TABLET | Freq: Every day | ORAL | Status: DC

## 2012-12-16 ENCOUNTER — Other Ambulatory Visit (INDEPENDENT_AMBULATORY_CARE_PROVIDER_SITE_OTHER): Payer: Medicare Other

## 2012-12-16 DIAGNOSIS — E785 Hyperlipidemia, unspecified: Secondary | ICD-10-CM

## 2012-12-16 DIAGNOSIS — E119 Type 2 diabetes mellitus without complications: Secondary | ICD-10-CM

## 2012-12-16 DIAGNOSIS — I10 Essential (primary) hypertension: Secondary | ICD-10-CM

## 2012-12-16 LAB — COMPREHENSIVE METABOLIC PANEL
ALT: 15 U/L (ref 0–35)
AST: 19 U/L (ref 0–37)
Albumin: 4.4 g/dL (ref 3.5–5.2)
Alkaline Phosphatase: 72 U/L (ref 39–117)
BUN: 13 mg/dL (ref 6–23)
CO2: 32 mEq/L (ref 19–32)
Calcium: 9.9 mg/dL (ref 8.4–10.5)
Chloride: 104 mEq/L (ref 96–112)
Creatinine, Ser: 0.7 mg/dL (ref 0.4–1.2)
GFR: 81.32 mL/min (ref >60.00)
Glucose, Bld: 95 mg/dL (ref 70–99)
Potassium: 4.2 mEq/L (ref 3.5–5.1)
Sodium: 142 mEq/L (ref 135–145)
Total Bilirubin: 0.6 mg/dL (ref 0.3–1.2)
Total Protein: 7.4 g/dL (ref 6.0–8.3)

## 2012-12-16 LAB — HEMOGLOBIN A1C: Hgb A1c MFr Bld: 6.5 % (ref 4.6–6.5)

## 2012-12-16 LAB — LIPID PANEL
Cholesterol: 150 mg/dL (ref 0–200)
HDL: 60.2 mg/dL (ref >39.00)
LDL Cholesterol: 78 mg/dL (ref 0–99)
Total CHOL/HDL Ratio: 2
Triglycerides: 58 mg/dL (ref 0.0–149.0)
VLDL: 11.6 mg/dL (ref 0.0–40.0)

## 2012-12-20 ENCOUNTER — Ambulatory Visit (INDEPENDENT_AMBULATORY_CARE_PROVIDER_SITE_OTHER): Payer: Medicare Other | Admitting: Family Medicine

## 2012-12-20 ENCOUNTER — Encounter: Payer: Self-pay | Admitting: Family Medicine

## 2012-12-20 VITALS — BP 132/70 | HR 54 | Temp 98.2°F | Ht 62.5 in | Wt 163.0 lb

## 2012-12-20 DIAGNOSIS — E119 Type 2 diabetes mellitus without complications: Secondary | ICD-10-CM

## 2012-12-20 DIAGNOSIS — I10 Essential (primary) hypertension: Secondary | ICD-10-CM

## 2012-12-20 DIAGNOSIS — B353 Tinea pedis: Secondary | ICD-10-CM

## 2012-12-20 DIAGNOSIS — E785 Hyperlipidemia, unspecified: Secondary | ICD-10-CM

## 2012-12-20 LAB — HM DIABETES FOOT EXAM

## 2012-12-20 MED ORDER — CLOTRIMAZOLE 1 % EX CREA: TOPICAL_CREAM | Freq: Two times a day (BID) | CUTANEOUS | Status: DC

## 2012-12-20 NOTE — Assessment & Plan Note (Signed)
Well controlled. Continue current medication.  

## 2012-12-20 NOTE — Progress Notes (Signed)
Subjective:    Patient ID: Janet Chapman, female    DOB: 1935/06/26, 77 y.o.   MRN: [id number]  HPI 77 year old female presents for 6 month follow up.  Has been seeing chiropractor for chronic back pain. Improving gradually.  JY:NWGN controlled. Does not check blood sugar at home.  Lab Results  Component Value Date   HGBA1C 6.5 12/16/2012  Working on lifestyle changes  Diet: moderate  Exercise: None  Eye:  Wt Readings from Last 3 Encounters:  12/20/12 163 lb (73.936 kg)  02/21/12 167 lb (75.751 kg)  12/14/11 169 lb 8 oz (76.885 kg)     High cholesterol: good control on simvastatin 40 mg daily.  Lab Results  Component Value Date   CHOL 150 12/16/2012   HDL 60.20 12/16/2012   LDLCALC 78 12/16/2012   LDLDIRECT 119.8 04/26/2009   TRIG 58.0 12/16/2012   CHOLHDL 2 12/16/2012   No myalgia.   HTN: Well controlled on diltiazem and metoprolol.  Saw Dr. Mariah Milling for palpitations in 2012... using metoprolol extra 50 mg rarely because pulse okay. Has not been back to see him.  No CP with exertion. No SOB. No edema.    Review of Systems  Constitutional: Negative for fever and fatigue.  HENT: Negative for ear pain.   Eyes: Negative for pain.  Respiratory: Negative for chest tightness and shortness of breath.   Cardiovascular: Negative for chest pain, palpitations and leg swelling.  Gastrointestinal: Negative for abdominal pain.  Genitourinary: Negative for dysuria.       Objective:   Physical Exam  Constitutional: Vital signs are normal. She appears well-developed and well-nourished. She is cooperative.  Non-toxic appearance. She does not appear ill. No distress.  HENT:  Head: Normocephalic.  Right Ear: Hearing, tympanic membrane, external ear and ear canal normal. Tympanic membrane is not erythematous, not retracted and not bulging.  Left Ear: Hearing, tympanic membrane, external ear and ear canal normal. Tympanic membrane is not erythematous, not retracted and not bulging.  Nose:  No mucosal edema or rhinorrhea. Right sinus exhibits no maxillary sinus tenderness and no frontal sinus tenderness. Left sinus exhibits no maxillary sinus tenderness and no frontal sinus tenderness.  Mouth/Throat: Uvula is midline, oropharynx is clear and moist and mucous membranes are normal.  Eyes: Conjunctivae, EOM and lids are normal. Pupils are equal, round, and reactive to light. No foreign bodies found.  Neck: Trachea normal and normal range of motion. Neck supple. Carotid bruit is not present. No mass and no thyromegaly present.  Cardiovascular: Normal rate, regular rhythm, S1 normal, S2 normal, normal heart sounds, intact distal pulses and normal pulses.  Exam reveals no gallop and no friction rub.   No murmur heard. Pulmonary/Chest: Effort normal and breath sounds normal. Not tachypneic. No respiratory distress. She has no decreased breath sounds. She has no wheezes. She has no rhonchi. She has no rales.  Abdominal: Soft. Normal appearance and bowel sounds are normal. There is no tenderness.  Neurological: She is alert.  Skin: Skin is warm, dry and intact. No rash noted.  Psychiatric: Her speech is normal and behavior is normal. Judgment and thought content normal. Her mood appears not anxious. Cognition and memory are normal. She does not exhibit a depressed mood.   Diabetic foot exam: Abnormal inspection: dry flaky skin on soles of feet B.. itchy per pt off and on fora while., on erythema, no pustules, no blisters No skin breakdown No calluses  Normal DP pulses Normal sensation to light touch  and monofilament Nails thickened , yellowed,       Assessment & Plan:

## 2012-12-20 NOTE — Assessment & Plan Note (Signed)
Well controlled continue current work on lifestyle.

## 2012-12-20 NOTE — Assessment & Plan Note (Signed)
Excellent control on simvastatin.  Start exercise and continue with weight loss.

## 2012-12-20 NOTE — Assessment & Plan Note (Signed)
Treat with topical antifungal. Fungal infection in nails does not bother pt... Does not desire oral medication.

## 2012-12-20 NOTE — Patient Instructions (Addendum)
Treat fungal infection in feet with cream twice daily.  Keep up the good work on lifestyle.

## 2012-12-30 ENCOUNTER — Other Ambulatory Visit: Payer: Self-pay | Admitting: *Deleted

## 2012-12-30 MED ORDER — METOPROLOL SUCCINATE ER 50 MG PO TB24: 50.0000 mg | ORAL_TABLET | Freq: Every day | ORAL | Status: DC

## 2012-12-30 NOTE — Telephone Encounter (Signed)
Received faxed refill request from pharmacy. Refill sent back to pharmacy electronically.

## 2013-02-05 ENCOUNTER — Other Ambulatory Visit: Payer: Self-pay | Admitting: Dermatology

## 2013-02-05 ENCOUNTER — Other Ambulatory Visit: Payer: Self-pay | Admitting: *Deleted

## 2013-02-05 MED ORDER — OMEPRAZOLE 20 MG PO CPDR: 20.0000 mg | DELAYED_RELEASE_CAPSULE | Freq: Every day | ORAL | Status: DC

## 2013-02-07 ENCOUNTER — Encounter: Payer: Self-pay | Admitting: Family Medicine

## 2013-02-07 ENCOUNTER — Other Ambulatory Visit: Payer: Self-pay | Admitting: *Deleted

## 2013-02-07 MED ORDER — LOSARTAN POTASSIUM 25 MG PO TABS: ORAL_TABLET | ORAL | Status: DC

## 2013-02-07 NOTE — Telephone Encounter (Signed)
Pt left v/m requesting status of losartan refill; spoke with Deniece Portela at [facility] and he is working on filling losartan now. Pt notified.

## 2013-02-27 ENCOUNTER — Ambulatory Visit (INDEPENDENT_AMBULATORY_CARE_PROVIDER_SITE_OTHER): Payer: Medicare Other | Admitting: Family Medicine

## 2013-02-27 ENCOUNTER — Encounter: Payer: Self-pay | Admitting: Family Medicine

## 2013-02-27 VITALS — BP 180/80 | HR 80 | Temp 98.2°F | Wt 161.0 lb

## 2013-02-27 DIAGNOSIS — R079 Chest pain, unspecified: Secondary | ICD-10-CM | POA: Insufficient documentation

## 2013-02-27 DIAGNOSIS — R0789 Other chest pain: Secondary | ICD-10-CM | POA: Insufficient documentation

## 2013-02-27 LAB — TSH: TSH: 1.93 u[IU]/mL (ref 0.35–5.50)

## 2013-02-27 LAB — CBC WITH DIFFERENTIAL/PLATELET
Basophils Absolute: 0 10*3/uL (ref 0.0–0.1)
Basophils Relative: 0.1 % (ref 0.0–3.0)
Eosinophils Absolute: 0.1 10*3/uL (ref 0.0–0.7)
Eosinophils Relative: 1.5 % (ref 0.0–5.0)
HCT: 40.8 % (ref 36.0–46.0)
Hemoglobin: 13.7 g/dL (ref 12.0–15.0)
Lymphocytes Relative: 27.2 % (ref 12.0–46.0)
Lymphs Abs: 2.2 10*3/uL (ref 0.7–4.0)
MCHC: 33.7 g/dL (ref 30.0–36.0)
MCV: 94.3 fl (ref 78.0–100.0)
Monocytes Absolute: 0.5 10*3/uL (ref 0.1–1.0)
Monocytes Relative: 5.9 % (ref 3.0–12.0)
Neutro Abs: 5.3 10*3/uL (ref 1.4–7.7)
Neutrophils Relative %: 65.3 % (ref 43.0–77.0)
Platelets: 315 10*3/uL (ref 150.0–400.0)
RBC: 4.33 Mil/uL (ref 3.87–5.11)
RDW: 13.7 % (ref 11.5–14.6)
WBC: 8.1 10*3/uL (ref 4.5–10.5)

## 2013-02-27 LAB — BASIC METABOLIC PANEL
BUN: 16 mg/dL (ref 6–23)
CO2: 31 mEq/L (ref 19–32)
Calcium: 9.8 mg/dL (ref 8.4–10.5)
Chloride: 99 mEq/L (ref 96–112)
Creatinine, Ser: 0.7 mg/dL (ref 0.4–1.2)
GFR: 83.89 mL/min (ref >60.00)
Glucose, Bld: 122 mg/dL — ABNORMAL HIGH (ref 70–99)
Potassium: 4 mEq/L (ref 3.5–5.1)
Sodium: 141 mEq/L (ref 135–145)

## 2013-02-27 LAB — TROPONIN I: Troponin I: 0.02 ng/mL (ref <0.06)

## 2013-02-27 MED ORDER — LOSARTAN POTASSIUM 50 MG PO TABS: ORAL_TABLET | ORAL | Status: DC

## 2013-02-27 NOTE — Patient Instructions (Addendum)
Blood work today - we will call you with results. I think this is coming from stomach irritation - increase omeprazole to 2 pills daily for next 3 weeks. As your blood pressure is staying high - I also want to increase losartan to 2 pills daily (new prescription for 50mg  sent to pharmacy) Given your risk factors, I would like you to see heart doctor for further evaluation however. GI cocktail today - with good resolution of symptoms. If worsening pain, or new shortness of breath, please seek urgent care.

## 2013-02-27 NOTE — Assessment & Plan Note (Addendum)
Atypical chest pain in h/o GERD but also cardiac risk factors present  - well controlled HLD and DM and today uncontrolled HTN (attributed to anxiety over current symptoms). GI cocktail today resolved chest discomfort -pointing to dyspepsia. However does have persistently elevated blood pressures. Anticipate chest discomfort related to elevated blood pressure over last few weeks and worsening dyspepsia. Increase losartan to 50mg  daily, increase omeprazole to 40mg  daily. EKG stable today. I will refer her to cardiology for possible further risk stratification given comorbidities.  EKG: sinus bradycarida rate 50s, normal axis, intervals, no acute ST/T changes.  overall unchanged from tracing 2011

## 2013-02-27 NOTE — Progress Notes (Signed)
  Subjective:    Patient ID: Janet Chapman, female    DOB: 04/27/1936, 77 y.o.   MRN: [id number]  HPI CC: chest pain  Pleasant 77 yo with h/o T2DM, HTN, HLD presents with 2 wk h/o intermittent chest pain described as substernal pressure sensation in chest.  Not related to exertion, not relieved by rest. Tends to be worse at night, associated with indigestion.  Doesn't think food related.  No nausea, sweating, dyspnea or cough.  No radiation of pain.  Last chest discomfort was today.  Started this morning about 7-8 hours ago.  Feels mild pressure in mid chest, currently 1/10.  At its worst 1/10. So far has tried tylenol (didn't really help), alka seltzer (did help some), tums (may have helped). H/o GERD - on omeprazole 20mg  daily.  Prior well controlled. On aspirin daily. Cutting back on caffeine. Increased stress recently - husband recently had surgery for cancer.  BP Readings from Last 3 Encounters:  02/27/13 160/80  12/20/12 132/70  06/21/12 120/88  bp elevated today - higher than normal.  compliant with antihypertensives.  DM well controlled. Lab Results  Component Value Date   HGBA1C 6.5 12/16/2012  HLD - last LDL 70s.  Compliant with simvastatin daily. fmhx CAD - father at age 17.  H/o cardiolite stress test by Dr. Katrinka Blazing 2004 WNL per chart.  Past Medical History  Diagnosis Date  . Hyperlipidemia   . Hypertension   . Urinary incontinence   . Allergic rhinitis   . GERD (gastroesophageal reflux disease)   . Osteoarthritis     Past Surgical History  Procedure Laterality Date  . Cardiovascular stress test  2004    [facility]. [facility], cardiolite-normal  . Cholecystectomy  [phone]  . Abdominal hysterectomy  1993    TA    Family History  Problem Relation Age of Onset  . Heart attack Father 83  . Dementia Mother   . Stroke Mother 25  . Ovarian cancer Sister 4  . Diabetes      Maternal side  . Ovarian cancer      Grandmother  . Ovarian cancer      aunt    Review of  Systems Per HPI    Objective:   Physical Exam  Nursing note and vitals reviewed. Constitutional: She appears well-developed and well-nourished. No distress.  HENT:  Head: Normocephalic and atraumatic.  Mouth/Throat: Oropharynx is clear and moist. No oropharyngeal exudate.  Eyes: Conjunctivae and EOM are normal. Pupils are equal, round, and reactive to light. No scleral icterus.  Neck: Normal range of motion. Neck supple.  Cardiovascular: Normal rate, regular rhythm, normal heart sounds and intact distal pulses.   No murmur heard. Pulmonary/Chest: Effort normal and breath sounds normal. No respiratory distress. She has no wheezes. She has no rales. She exhibits no tenderness (no reproducible pain).  Abdominal: Soft. Bowel sounds are normal. She exhibits no distension and no mass. There is no tenderness. There is no rebound and no guarding.  Musculoskeletal: She exhibits no edema.  Lymphadenopathy:    She has no cervical adenopathy.  Skin: Skin is warm and dry. No rash noted.  Psychiatric: She has a normal mood and affect.       Assessment & Plan:

## 2013-02-28 LAB — CK TOTAL AND CKMB (NOT AT ARMC)
CK, MB: 1.2 ng/mL (ref 0.3–4.0)
Total CK: 74 U/L (ref 7–177)

## 2013-03-05 ENCOUNTER — Ambulatory Visit (INDEPENDENT_AMBULATORY_CARE_PROVIDER_SITE_OTHER): Payer: Medicare Other | Admitting: Cardiovascular Disease

## 2013-03-05 ENCOUNTER — Encounter: Payer: Self-pay | Admitting: Cardiovascular Disease

## 2013-03-05 VITALS — BP 174/90 | HR 63 | Ht 62.0 in | Wt 161.0 lb

## 2013-03-05 DIAGNOSIS — E119 Type 2 diabetes mellitus without complications: Secondary | ICD-10-CM

## 2013-03-05 DIAGNOSIS — R0789 Other chest pain: Secondary | ICD-10-CM

## 2013-03-05 DIAGNOSIS — I10 Essential (primary) hypertension: Secondary | ICD-10-CM

## 2013-03-05 DIAGNOSIS — E785 Hyperlipidemia, unspecified: Secondary | ICD-10-CM

## 2013-03-05 DIAGNOSIS — R079 Chest pain, unspecified: Secondary | ICD-10-CM

## 2013-03-05 NOTE — Assessment & Plan Note (Signed)
Atypical Normal exam improved with anti relfux meds.  Given age and HTN will schedule F/U ETT

## 2013-03-05 NOTE — Assessment & Plan Note (Signed)
Continue to titrate calcium blocker and ARB Consider adding diuretic Patient has BP cuff at home

## 2013-03-05 NOTE — Assessment & Plan Note (Signed)
Discussed low carb diet.  Target hemoglobin A1c is 6.5 or less.  Continue current medications.  

## 2013-03-05 NOTE — Assessment & Plan Note (Signed)
Cholesterol is at goal.  Continue current dose of statin and diet Rx.  No myalgias or side effects.  F/U  LFT's in 6 months. Lab Results  Component Value Date   LDLCALC 78 12/16/2012             

## 2013-03-05 NOTE — Progress Notes (Signed)
Patient ID: Janet Chapman, female   DOB: 18-Oct-1935, 77 y.o.   MRN: [id number]   Pleasant 77 yo with h/o T2DM, HTN, HLD referred with 2 wk h/o intermittent chest pain described as substernal pressure sensation in chest. Not related to exertion, not relieved by rest. Tends to be worse at night, associated with indigestion. Doesn't think food related. No nausea, sweating, dyspnea or cough. No radiation of pain. Last chest discomfort was today. Started this morning about 7-8 hours ago. Feels mild pressure in mid chest, currently 1/10. At its worst 1/10.  So far has tried tylenol (didn't really help), alka seltzer (did help some), tums (may have helped).  H/o GERD - on omeprazole 20mg  daily. Prior well controlled.  On aspirin daily.  Cutting back on caffeine.  Increased stress recently - husband recently had surgery for cancer.  BP been a bit high ARB increased last Thursday.      ROS: Denies fever, malais, weight loss, blurry vision, decreased visual acuity, cough, sputum, SOB, hemoptysis, pleuritic pain, palpitaitons, heartburn, abdominal pain, melena, lower extremity edema, claudication, or rash.  All other systems reviewed and negative   General: Affect appropriate Healthy:  appears stated age HEENT: normal Neck supple with no adenopathy JVP normal no bruits no thyromegaly Lungs clear with no wheezing and good diaphragmatic motion Heart:  S1/S2 no murmur,rub, gallop or click PMI normal Abdomen: benighn, BS positve, no tenderness, no AAA no bruit.  No HSM or HJR Distal pulses intact with no bruits No edema Neuro non-focal Skin warm and dry No muscular weakness  Medications Current Outpatient Prescriptions  Medication Sig Dispense Refill  . aspirin 81 MG tablet Take 81 mg by mouth daily.        . Calcium Carbonate-Vitamin D (CALCIUM 600+D) 600-200 MG-UNIT TABS Take 1 tablet by mouth daily.        . clotrimazole (LOTRIMIN) 1 % cream Apply topically 2 (two) times daily.  30 g  0  .  Coenzyme Q10 150 MG CAPS Take 1 capsule by mouth daily.        Marland Kitchen diltiazem (CARDIZEM) 120 MG tablet Take 1 tablet (120 mg total) by mouth daily.  90 tablet  1  . fish oil-omega-3 fatty acids 1000 MG capsule Take 2 g by mouth daily.        Marland Kitchen glucose blood test strip Use as instructed to check blood sugar once a day       . losartan (COZAAR) 50 MG tablet Take one po daily  90 tablet  1  . LUTEIN PO Take one by mouth daily      . metoprolol succinate (TOPROL-XL) 50 MG 24 hr tablet Take 1 tablet (50 mg total) by mouth daily.  90 tablet  1  . Multiple Vitamin (MULTIVITAMIN) tablet Take 1 tablet by mouth daily.        Marland Kitchen omeprazole (PRILOSEC) 20 MG capsule Take 40 mg by mouth daily.      . simvastatin (ZOCOR) 40 MG tablet Take 1 tablet (40 mg total) by mouth at bedtime.  90 tablet  1   No current facility-administered medications for this visit.    Allergies Codeine  Family History: Family History  Problem Relation Age of Onset  . Heart attack Father 53  . Dementia Mother   . Stroke Mother 27  . Ovarian cancer Sister 89  . Diabetes      Maternal side  . Ovarian cancer      Grandmother  . Ovarian  cancer      aunt    Social History: History   Social History  . Marital Status: Married    Spouse Name: N/A    Number of Children: 0  . Years of Education: N/A   Occupational History  . Secretary     [employer]  . 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 Smoking status: Never Smoker   . Smokeless tobacco: Never Used  . Alcohol Use: No  . Drug Use: No  . Sexual Activity: Not on file   Other Topics Concern  . Not on file   Social History Narrative    Regular exercise: yes, Curves 2x a week   Married 48 + years    Diet: fruit and veggies, no FF, some water, artificial sweetener   HCPOA: Remus Loffler, has living will, full code ( reviewed 2014)             Electrocardiogram:  10/16  SR rate 55 normal   Assessment and Plan

## 2013-03-05 NOTE — Patient Instructions (Signed)

## 2013-03-18 ENCOUNTER — Telehealth: Payer: Self-pay | Admitting: Family Medicine

## 2013-03-18 MED ORDER — LOSARTAN POTASSIUM 100 MG PO TABS: ORAL_TABLET | ORAL | Status: DC

## 2013-03-18 NOTE — Telephone Encounter (Signed)
Patient notified as instructed by telephone.  Will call back to schedule an appointment with Dr. Ermalene Searing in two weeks for BP check.

## 2013-03-18 NOTE — Telephone Encounter (Signed)
Notify pt that note received and reviewed. Reviewed recent cardiology note.  We will titrate up her Bp med.    I sent in rx for increase of losartan to 100 mg daily. She can use up the 50 mg tabs if she has any by taking two a day prior to filling prescription.  have her make an appt for a BP check with me in 2 weeks... Will check renal function on higher dose of the medication at that time.

## 2013-03-18 NOTE — Telephone Encounter (Signed)
Left message for patient to return my call.

## 2013-04-01 ENCOUNTER — Encounter: Payer: Self-pay | Admitting: Family Medicine

## 2013-04-01 ENCOUNTER — Ambulatory Visit (INDEPENDENT_AMBULATORY_CARE_PROVIDER_SITE_OTHER): Payer: Medicare Other | Admitting: Family Medicine

## 2013-04-01 VITALS — BP 150/80 | HR 54 | Temp 98.3°F | Ht 62.0 in | Wt 164.5 lb

## 2013-04-01 DIAGNOSIS — I10 Essential (primary) hypertension: Secondary | ICD-10-CM

## 2013-04-01 DIAGNOSIS — M654 Radial styloid tenosynovitis [de Quervain]: Secondary | ICD-10-CM

## 2013-04-01 MED ORDER — HYDROCHLOROTHIAZIDE 25 MG PO TABS: 25.0000 mg | ORAL_TABLET | Freq: Every day | ORAL | Status: DC

## 2013-04-01 MED ORDER — MELOXICAM 15 MG PO TABS: 7.5000 mg | ORAL_TABLET | Freq: Every day | ORAL | Status: DC

## 2013-04-01 MED ORDER — OMEPRAZOLE 20 MG PO CPDR: 40.0000 mg | DELAYED_RELEASE_CAPSULE | Freq: Every day | ORAL | Status: DC

## 2013-04-01 NOTE — Progress Notes (Signed)
Pre-visit discussion using our clinic review tool. No additional management support is needed unless otherwise documented below in the visit note.  

## 2013-04-01 NOTE — Progress Notes (Signed)
  Subjective:    Patient ID: Janet Chapman, female    DOB: August 24, 1935, 77 y.o.   MRN: [id number]  HPI  77 year old female presents for follow up on HTN.  Saw Cardiology in 02/2013 for chest pain.  Improved with omeprazole 40 mg daily.  Has scheduled stress test.  Hypertension:  Inadequate control on losartan,metoprolol, cardiazem  At home lately BP has been 150/80s  HR 50s BP Readings from Last 3 Encounters:  04/01/13 150/80  03/05/13 174/90  02/27/13 180/80  Using medication without problems or lightheadedness:  None Chest pain with exertion:None Edema:None Short of breath:None Other issues:  Wt Readings from Last 3 Encounters:  04/01/13 164 lb 8 oz (74.617 kg)  03/05/13 161 lb (73.029 kg)  02/27/13 161 lb (73.029 kg)    She has been having pain in left thumb to  Left lateral wrist  She is interested in trying high dose fish oil prescription for arthritis.      Review of Systems  Constitutional: Negative for fever and fatigue.  HENT: Negative for ear pain.   Eyes: Negative for pain.  Respiratory: Negative for chest tightness and shortness of breath.   Cardiovascular: Negative for palpitations and leg swelling.  Gastrointestinal: Negative for abdominal pain.  Genitourinary: Negative for dysuria.       Objective:   Physical Exam  Constitutional: Vital signs are normal. She appears well-developed and well-nourished. She is cooperative.  Non-toxic appearance. She does not appear ill. No distress.  HENT:  Head: Normocephalic.  Right Ear: Hearing, tympanic membrane, external ear and ear canal normal. Tympanic membrane is not erythematous, not retracted and not bulging.  Left Ear: Hearing, tympanic membrane, external ear and ear canal normal. Tympanic membrane is not erythematous, not retracted and not bulging.  Nose: No mucosal edema or rhinorrhea. Right sinus exhibits no maxillary sinus tenderness and no frontal sinus tenderness. Left sinus exhibits no maxillary  sinus tenderness and no frontal sinus tenderness.  Mouth/Throat: Uvula is midline, oropharynx is clear and moist and mucous membranes are normal.  Eyes: Conjunctivae, EOM and lids are normal. Pupils are equal, round, and reactive to light. Lids are everted and swept, no foreign bodies found.  Neck: Trachea normal and normal range of motion. Neck supple. Carotid bruit is not present. No mass and no thyromegaly present.  Cardiovascular: Normal rate, regular rhythm, S1 normal, S2 normal, normal heart sounds, intact distal pulses and normal pulses.  Exam reveals no gallop and no friction rub.   No murmur heard. Pulmonary/Chest: Effort normal and breath sounds normal. Not tachypneic. No respiratory distress. She has no decreased breath sounds. She has no wheezes. She has no rhonchi. She has no rales.  Abdominal: Soft. Normal appearance and bowel sounds are normal. There is no tenderness.  Musculoskeletal:  Swelling, no redness in multiple dip joints in fingers  ttp over left mcp joint and lateral wrist, pos finklestein test.  Neurological: She is alert.  Skin: Skin is warm, dry and intact. No rash noted.  Psychiatric: Her speech is normal and behavior is normal. Judgment and thought content normal. Her mood appears not anxious. Cognition and memory are normal. She does not exhibit a depressed mood.          Assessment & Plan:

## 2013-04-01 NOTE — Patient Instructions (Addendum)
Start by increasing fish oil OTC to 3000 mg to 4000 mg divided daily. Can increase glucosamine to 500 mg three times a day as needed.  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  Start meloxicam as needed for inflammation and pain. Change back to losartan/HCTZ daily. Follow up BP in 2 weeks.

## 2013-04-08 ENCOUNTER — Encounter: Payer: Self-pay | Admitting: Family Medicine

## 2013-04-14 ENCOUNTER — Encounter: Payer: Self-pay | Admitting: Nurse Practitioner

## 2013-04-14 ENCOUNTER — Ambulatory Visit (INDEPENDENT_AMBULATORY_CARE_PROVIDER_SITE_OTHER): Payer: Medicare Other | Admitting: Nurse Practitioner

## 2013-04-14 VITALS — BP 138/70 | HR 60

## 2013-04-14 DIAGNOSIS — R0789 Other chest pain: Secondary | ICD-10-CM

## 2013-04-14 NOTE — Progress Notes (Addendum)
Exercise Treadmill Test  Pre-Exercise Testing Evaluation Rhythm: sinus bradycardia  Rate: 54     Test  Exercise Tolerance Test Ordering MD: Charlton Haws, MD  Interpreting MD: Norma Fredrickson, NP  Unique Test No: 1  Treadmill:  1  Indication for ETT: chest pain - rule out ischemia  Contraindication to ETT: No   Stress Modality: exercise - treadmill  Cardiac Imaging Performed: non   Protocol: standard Bruce - maximal  Max BP:  210/84  Max MPHR (bpm):  145 85% MPR (bpm):  123  MPHR obtained (bpm):  125 % MPHR obtained:  86%  Reached 85% MPHR (min:sec):  3:32 Total Exercise Time (min-sec):  4 minutes  Workload in METS:  5.8 Borg Scale: 17  Reason ETT Terminated:  patient's desire to stop    ST Segment Analysis At Rest: normal ST segments - no evidence of significant ST depression With Exercise: borderline ST changes  Other Information Arrhythmia:  No Angina during ETT:  absent (0) Quality of ETT:  diagnostic  ETT Interpretation:  borderline (indeterminate) with non-specific ST changes  Comments: Patient presents today for routine GXT. Has had atypical chest pain. Pretty sedentary life style.   Today the patient exercised on the standard Bruce protocol for a total of 4 minutes.  Poor exercise tolerance.  Mildly hypertensive blood pressure response.  Clinically negative for chest pain. Test was stopped due to fatigue/dyspnea. She denied chest pain. EKG with borderline ST depression - worse in recovery - inferiorly. No significant arrhythmia noted but had rare PVCs with stress portion.   Recommendations: Lexiscan to further define.  Further disposition to follow  Patient is agreeable to this plan and will call if any problems develop in the interim.   Rosalio Macadamia, RN, ANP-C [facility] [address] [place], [place]  [address]   Addendum: While scheduling the Myoview, patient reported that she did have some brief chest pressure -  rate a "1" on a scale of 1-4. Totally resolved prior to leaving the office. Will proceed on with Myoview.

## 2013-04-17 ENCOUNTER — Ambulatory Visit: Payer: Medicare Other | Admitting: Family Medicine

## 2013-04-20 DIAGNOSIS — M654 Radial styloid tenosynovitis [de Quervain]: Secondary | ICD-10-CM | POA: Insufficient documentation

## 2013-04-20 NOTE — Assessment & Plan Note (Signed)
Change back to losartan/HCTZ daily. Follow up BP in 2 weeks.

## 2013-04-20 NOTE — Assessment & Plan Note (Signed)
Can increase glucosamine to 500 mg three times a day as needed.  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  Start meloxicam as needed for inflammation and pain.

## 2013-04-22 ENCOUNTER — Ambulatory Visit (INDEPENDENT_AMBULATORY_CARE_PROVIDER_SITE_OTHER): Payer: Medicare Other | Admitting: Family Medicine

## 2013-04-22 ENCOUNTER — Encounter: Payer: Self-pay | Admitting: Family Medicine

## 2013-04-22 VITALS — BP 124/60 | HR 61 | Temp 97.5°F | Ht 62.0 in | Wt 166.2 lb

## 2013-04-22 DIAGNOSIS — I10 Essential (primary) hypertension: Secondary | ICD-10-CM

## 2013-04-22 DIAGNOSIS — M654 Radial styloid tenosynovitis [de Quervain]: Secondary | ICD-10-CM

## 2013-04-22 NOTE — Assessment & Plan Note (Signed)
Now adequately controlled on losartan 100 mg and HCTZ. When refilled combine two pills.  Encouraged exercise, weight loss, healthy eating habits.

## 2013-04-22 NOTE — Patient Instructions (Signed)
Continue current BP medication. Can use salonpas or other topical treatments. Use meloxicam only as needed. Wear brace on wrist.  If not almost improved in 2 more weeks call.  Schedule medicare wellness with fasting labs prior in end of 06/2013.

## 2013-04-22 NOTE — Progress Notes (Signed)
   Subjective:    Patient ID: Janet Chapman, female    DOB: 21-Sep-1935, 77 y.o.   MRN: [id number]  HPI  Hypertension:  Improved control BP back on losartan/HCTZ BP Readings from Last 3 Encounters:  04/22/13 124/60  04/14/13 138/70  04/01/13 150/80   Using medication without problems or lightheadedness: None Chest pain with exertion:None Edema:None Short of breath:None Average home BPs:130/70s Other issues:   Left wrist pain ( Dequarvain's tenosynovitis)... Improved with wrist brace and  Meloxicam ( 50% better), but still sore to palpation and with movement.  Review of Systems  Constitutional: Negative for fever and fatigue.  HENT: Negative for ear pain.   Eyes: Negative for pain.  Respiratory: Negative for chest tightness and shortness of breath.   Cardiovascular: Negative for chest pain, palpitations and leg swelling.  Gastrointestinal: Negative for abdominal pain.  Genitourinary: Negative for dysuria.       Objective:   Physical Exam  Constitutional: She is oriented to person, place, and time. Vital signs are normal. She appears well-developed and well-nourished. She is cooperative.  Non-toxic appearance. She does not appear ill. No distress.  HENT:  Head: Normocephalic.  Right Ear: Hearing, tympanic membrane, external ear and ear canal normal. Tympanic membrane is not erythematous, not retracted and not bulging.  Left Ear: Hearing, tympanic membrane, external ear and ear canal normal. Tympanic membrane is not erythematous, not retracted and not bulging.  Nose: No mucosal edema or rhinorrhea. Right sinus exhibits no maxillary sinus tenderness and no frontal sinus tenderness. Left sinus exhibits no maxillary sinus tenderness and no frontal sinus tenderness.  Mouth/Throat: Uvula is midline, oropharynx is clear and moist and mucous membranes are normal.  Eyes: Conjunctivae, EOM and lids are normal. Pupils are equal, round, and reactive to light. Lids are everted and swept, no  foreign bodies found.  Neck: Trachea normal and normal range of motion. Neck supple. Carotid bruit is not present. No mass and no thyromegaly present.  Cardiovascular: Normal rate, regular rhythm, S1 normal, S2 normal, normal heart sounds, intact distal pulses and normal pulses.  Exam reveals no gallop and no friction rub.   No murmur heard. Pulmonary/Chest: Effort normal and breath sounds normal. Not tachypneic. No respiratory distress. She has no decreased breath sounds. She has no wheezes. She has no rhonchi. She has no rales.  Abdominal: Soft. Normal appearance and bowel sounds are normal. There is no tenderness.  Musculoskeletal:  No swelling, no redness in multiple dip joints in fingers  ttp over left mcp joint and lateral wrist, pos finklestein test.  Neurological: She is alert and oriented to person, place, and time.  Skin: Skin is warm, dry and intact. No rash noted.  Small area ( appears like abraision) on distal nose, erythematous, bled this AM  Psychiatric: Her speech is normal and behavior is normal. Judgment and thought content normal. Her mood appears not anxious. Cognition and memory are normal. She does not exhibit a depressed mood.          Assessment & Plan:

## 2013-04-22 NOTE — Progress Notes (Signed)
Pre-visit discussion using our clinic review tool. No additional management support is needed unless otherwise documented below in the visit note.  

## 2013-04-22 NOTE — Assessment & Plan Note (Signed)
Continue wearing brace, can use meloxicam prn.  if not continuing to improve... Refer to ortho for steroid injection etc.

## 2013-04-23 ENCOUNTER — Other Ambulatory Visit: Payer: Self-pay | Admitting: *Deleted

## 2013-04-23 MED ORDER — DILTIAZEM HCL 120 MG PO TABS: 120.0000 mg | ORAL_TABLET | Freq: Every day | ORAL | Status: DC

## 2013-04-24 ENCOUNTER — Encounter: Payer: Self-pay | Admitting: Family Medicine

## 2013-04-25 ENCOUNTER — Encounter (HOSPITAL_COMMUNITY): Payer: Medicare Other

## 2013-04-25 ENCOUNTER — Encounter: Payer: Self-pay | Admitting: Cardiovascular Disease

## 2013-04-25 ENCOUNTER — Encounter: Payer: Self-pay | Admitting: Cardiology

## 2013-04-25 ENCOUNTER — Ambulatory Visit (HOSPITAL_COMMUNITY): Payer: Medicare Other | Attending: Nurse Practitioner | Admitting: Radiology

## 2013-04-25 VITALS — BP 153/77 | HR 56 | Ht 62.0 in | Wt 157.0 lb

## 2013-04-25 DIAGNOSIS — R9439 Abnormal result of other cardiovascular function study: Secondary | ICD-10-CM

## 2013-04-25 DIAGNOSIS — R0789 Other chest pain: Secondary | ICD-10-CM | POA: Insufficient documentation

## 2013-04-25 DIAGNOSIS — R079 Chest pain, unspecified: Secondary | ICD-10-CM

## 2013-04-25 MED ORDER — TECHNETIUM TC 99M SESTAMIBI GENERIC - CARDIOLITE
33.0000 | Freq: Once | INTRAVENOUS | Status: AC | PRN
  Administered 2013-04-25: 33 via INTRAVENOUS

## 2013-04-25 MED ORDER — TECHNETIUM TC 99M SESTAMIBI GENERIC - CARDIOLITE
11.0000 | Freq: Once | INTRAVENOUS | Status: AC | PRN
  Administered 2013-04-25: 11 via INTRAVENOUS

## 2013-04-25 MED ORDER — REGADENOSON 0.4 MG/5ML IV SOLN
0.4000 mg | Freq: Once | INTRAVENOUS | Status: AC
  Administered 2013-04-25: 0.4 mg via INTRAVENOUS

## 2013-04-25 NOTE — Progress Notes (Signed)
[facility] SITE 3 NUCLEAR MED [address] [place], [place] [address] [phone]    Cardiology Nuclear Med Study  Janet Chapman is a 77 y.o. female     MRN : [id number]     DOB: 08-Mar-1936  Procedure Date: 04/25/2013  Nuclear Med Background Indication for Stress Test:  Evaluation for Ischemia and 04-14-13:Abnormal ETT Borderline (indeterminate) with nonspecific ST changes History:  No known CAD Cardiac Risk Factors: Family History - CAD, Hypertension, Lipids and NIDDM  Symptoms:  Chest Pain   Nuclear Pre-Procedure Caffeine/Decaff Intake:  None > 12 hrs NPO After: 6:00am   Lungs:  clear O2 Sat: 96% on room air. IV 0.9% NS with Angio Cath:  20g  IV Site: L Antecubital x 1, tolerated well IV Started by:  Irean Hong, RN  Chest Size (in):  40 Cup Size: C  Height: 5\' 2"  (1.575 m)  Weight:  157 lb (71.215 kg)  BMI:  Body mass index is 28.71 kg/(m^2). Tech Comments:  Took Toprol and Cardizem this am    Nuclear Med Study 1 or 2 day study: 1 day  Stress Test Type:  Treadmill/Lexiscan  Reading MD: Dietrich Pates, MD  Order Authorizing Provider:  Charlton Haws, MD, and Norma Fredrickson, NP  Resting Radionuclide: Technetium 35m Sestamibi  Resting Radionuclide Dose: 11.0 mCi   Stress Radionuclide:  Technetium 81m Sestamibi  Stress Radionuclide Dose: 33.0 mCi           Stress Protocol Rest HR: 56 Stress HR: 94  Rest BP: 153/77 Stress BP: 154/90  Exercise Time (min): n/a METS: n/a           Dose of Adenosine (mg):  n/a Dose of Lexiscan: 0.4 mg  Dose of Atropine (mg): n/a Dose of Dobutamine: n/a mcg/kg/min (at max HR)  Stress Test Technologist: Nelson Chimes, BS-ES  Nuclear Technologist:  Dario Guardian, CNMT     Rest Procedure:  Myocardial perfusion imaging was performed at rest 45 minutes following the intravenous administration of Technetium 34m Sestamibi. Rest ECG: NSR with non-specific ST-T wave changes  Stress Procedure:  The patient received IV Lexiscan 0.4 mg  over 15-seconds with concurrent low level exercise and then Technetium 67m Sestamibi was injected at 30-seconds while the patient continued walking one more minute.  Quantitative spect images were obtained after a 45-minute delay. During the infusion of Lexiscan, the patient complained of feeling dizzy and nauseated.  These symptoms began to resolve in recovery.  Stress ECG: No significant change from baseline ECG  QPS Raw Data Images: Soft tissue (diaphragm, bowel activity, breast) surruound heart.   Stress Images:  Normal homogeneous uptake in all areas of the myocardium. Rest Images: Thinning in apex  Otherwise normal perfusion.   Subtraction (SDS):  No evidence of ischemia. Transient Ischemic Dilatation (Normal <1.22):  0.98 Lung/Heart Ratio (Normal <0.45):  0.19  Quantitative Gated Spect Images QGS EDV:  77 ml QGS ESV:  23 ml  Impression Exercise Capacity:  Lexiscan with low level exercise. BP Response:  Normal blood pressure response. Clinical Symptoms:  No chest pain. ECG Impression:  No significant ST segment change suggestive of ischemia. Comparison with Prior Nuclear Study:  No prior study.    Overall Impression:  Normal stress nuclear study.  LV Ejection Fraction: 71%.  LV Wall Motion:  NL LV Function; NL Wall Motion  Dietrich Pates

## 2013-05-16 ENCOUNTER — Encounter (HOSPITAL_COMMUNITY): Payer: Self-pay | Admitting: Emergency Medicine

## 2013-05-16 ENCOUNTER — Emergency Department (INDEPENDENT_AMBULATORY_CARE_PROVIDER_SITE_OTHER): Payer: Medicare Other

## 2013-05-16 ENCOUNTER — Emergency Department (HOSPITAL_COMMUNITY)
Admission: EM | Admit: 2013-05-16 | Discharge: 2013-05-16 | Disposition: A | Discharge status: Home / Self Care | Payer: Medicare Other | Source: Home / Self Care | Attending: Family Medicine | Admitting: Family Medicine

## 2013-05-16 DIAGNOSIS — J189 Pneumonia, unspecified organism: Secondary | ICD-10-CM

## 2013-05-16 LAB — POCT I-STAT, CHEM 8
BUN: 25 mg/dL — ABNORMAL HIGH (ref 6–23)
Calcium, Ion: 1.19 mmol/L (ref 1.13–1.30)
Chloride: 91 mEq/L — ABNORMAL LOW (ref 96–112)
Creatinine, Ser: 0.9 mg/dL (ref 0.50–1.10)
Glucose, Bld: 123 mg/dL — ABNORMAL HIGH (ref 70–99)
HCT: 46 % (ref 36.0–46.0)
Hemoglobin: 15.6 g/dL — ABNORMAL HIGH (ref 12.0–15.0)
Potassium: 4 mEq/L (ref 3.7–5.3)
Sodium: 134 mEq/L — ABNORMAL LOW (ref 137–147)
TCO2: 36 mmol/L (ref 0–100)

## 2013-05-16 MED ORDER — IPRATROPIUM-ALBUTEROL 0.5-2.5 (3) MG/3ML IN SOLN
RESPIRATORY_TRACT | Status: AC
  Filled 2013-05-16: qty 3

## 2013-05-16 MED ORDER — PREDNISONE 50 MG PO TABS: 50.0000 mg | ORAL_TABLET | Freq: Every day | ORAL | Status: DC

## 2013-05-16 MED ORDER — IPRATROPIUM BROMIDE 0.02 % IN SOLN
0.5000 mg | Freq: Once | RESPIRATORY_TRACT | Status: AC
  Administered 2013-05-16: 0.5 mg via RESPIRATORY_TRACT

## 2013-05-16 MED ORDER — ALBUTEROL SULFATE (5 MG/ML) 0.5% IN NEBU
5.0000 mg | INHALATION_SOLUTION | Freq: Once | RESPIRATORY_TRACT | Status: AC
  Administered 2013-05-16: 5 mg via RESPIRATORY_TRACT

## 2013-05-16 MED ORDER — MOXIFLOXACIN HCL 400 MG PO TABS: 400.0000 mg | ORAL_TABLET | Freq: Every day | ORAL | Status: DC

## 2013-05-16 NOTE — ED Provider Notes (Signed)
Medical screening examination/treatment/procedure(s) were performed by resident physician or non-physician practitioner and as supervising physician I was immediately available for consultation/collaboration.   Pauline Good MD.   Billy Fischer, MD 05/16/13 Drema Halon

## 2013-05-16 NOTE — Discharge Instructions (Signed)

## 2013-05-16 NOTE — ED Notes (Signed)
Reports having chest congestion.  Mild sob.  States just getting over the flu but chest is still no clearing up.  Denies fever, n/v/d.   No relief with otc meds.   Symptoms present since 12/23.

## 2013-05-16 NOTE — ED Provider Notes (Signed)
CSN: [id number]     Arrival date & time 05/16/13  1222 History   First MD Initiated Contact with Patient 05/16/13 1409     Chief Complaint  Patient presents with  . URI   (Consider location/radiation/quality/duration/timing/severity/associated sxs/prior Treatment) HPI Comments: 78 year old female presents complaining of cough and chest congestion. Also mild shortness of breath. She states that she had a flulike illness over the past week but it started to clear up. She is still having the chest congestion and a bad cough. She cannot recall ever having any illness like this before, she states she feels terrible. No fever at home or chills, NVD, pleuritic chest pain. She does not smoke has no history of malignancy, DVT, or PE. No recent travel or sick contacts. No recent surgery  Patient is a 78 y.o. female presenting with URI.  URI Presenting symptoms: cough   Presenting symptoms: no fever   Associated symptoms: no arthralgias and no myalgias     Past Medical History  Diagnosis Date  . Hyperlipidemia   . Hypertension   . Urinary incontinence   . Allergic rhinitis   . GERD (gastroesophageal reflux disease)   . Osteoarthritis    Past Surgical History  Procedure Laterality Date  . Cardiovascular stress test  2004    [facility]. [facility], cardiolite-normal  . Cholecystectomy  [phone]  . Abdominal hysterectomy  1993    TA   Family History  Problem Relation Age of Onset  . Heart attack Father 63  . Dementia Mother   . Stroke Mother 47  . Ovarian cancer Sister 83  . Diabetes      Maternal side  . Ovarian cancer      Grandmother  . Ovarian cancer      aunt   History  Substance Use Topics  . Smoking status: Never Smoker   . Smokeless tobacco: Never Used  . Alcohol Use: No   OB History   Grav Para Term Preterm Abortions TAB SAB Ect Mult Living                 Review of Systems  Constitutional: Negative for fever and chills.  Eyes: Negative for visual disturbance.  Respiratory:  Positive for cough, chest tightness and shortness of breath.   Cardiovascular: Negative for chest pain, palpitations and leg swelling.  Gastrointestinal: Negative for nausea, vomiting and abdominal pain.  Endocrine: Negative for polydipsia and polyuria.  Genitourinary: Negative for dysuria, urgency and frequency.  Musculoskeletal: Negative for arthralgias and myalgias.  Skin: Negative for rash.  Neurological: Negative for dizziness, weakness and light-headedness.    Allergies  Codeine  Home Medications   Current Outpatient Rx  Name  Route  Sig  Dispense  Refill  . aspirin 81 MG tablet   Oral   Take 81 mg by mouth daily.           . Calcium Carbonate-Vitamin D (CALCIUM 600+D) 600-200 MG-UNIT TABS   Oral   Take 1 tablet by mouth daily.           . Coenzyme Q10 150 MG CAPS   Oral   Take 1 capsule by mouth daily.           Marland Kitchen diltiazem (CARDIZEM) 120 MG tablet   Oral   Take 1 tablet (120 mg total) by mouth daily.   90 tablet   1   . fish oil-omega-3 fatty acids 1000 MG capsule   Oral   Take 2 g by mouth daily.           Marland Kitchen  Glucosamine-Chondroit-Vit C-Mn (GLUCOSAMINE CHONDR 1500 COMPLX PO)   Oral   Take 1 tablet by mouth daily.         Marland Kitchen glucose blood test strip      Use as instructed to check blood sugar once a day          . hydrochlorothiazide (HYDRODIURIL) 25 MG tablet   Oral   Take 1 tablet (25 mg total) by mouth daily.   90 tablet   3   . losartan (COZAAR) 100 MG tablet      Take one po daily   90 tablet   1   . LUTEIN PO      Take one by mouth daily         . meloxicam (MOBIC) 15 MG tablet   Oral   Take 0.5-1 tablets (7.5-15 mg total) by mouth daily.   30 tablet   0   . metoprolol succinate (TOPROL-XL) 50 MG 24 hr tablet   Oral   Take 1 tablet (50 mg total) by mouth daily.   90 tablet   1   . Multiple Vitamin (MULTIVITAMIN) tablet   Oral   Take 1 tablet by mouth daily.           Marland Kitchen omeprazole (PRILOSEC) 20 MG capsule    Oral   Take 2 capsules (40 mg total) by mouth daily.   180 capsule   1   . simvastatin (ZOCOR) 40 MG tablet   Oral   Take 1 tablet (40 mg total) by mouth at bedtime.   90 tablet   1   . moxifloxacin (AVELOX) 400 MG tablet   Oral   Take 1 tablet (400 mg total) by mouth daily at 8 pm.   7 tablet   0   . predniSONE (DELTASONE) 50 MG tablet   Oral   Take 1 tablet (50 mg total) by mouth daily with breakfast.   5 tablet   0    BP 151/68  Pulse 66  Temp(Src) 98.2 F (36.8 C) (Oral)  Resp 16  SpO2 95% Physical Exam  Nursing note and vitals reviewed. Constitutional: She is oriented to person, place, and time. Vital signs are normal. She appears well-developed and well-nourished. No distress.  HENT:  Head: Normocephalic and atraumatic.  Right Ear: External ear normal.  Left Ear: External ear normal.  Nose: Nose normal.  Mouth/Throat: Oropharynx is clear and moist. No oropharyngeal exudate.  Cardiovascular: Normal rate, regular rhythm and normal heart sounds.  Exam reveals no gallop and no friction rub.   No murmur heard. Pulmonary/Chest: Effort normal. No respiratory distress. She has wheezes (diffuse, worse in upper fields). She has rhonchi (Diffuse). She has rales in the right middle field, the right lower field and the left lower field.  Neurological: She is alert and oriented to person, place, and time. She has normal strength. Coordination normal.  Skin: Skin is warm and dry. No rash noted. She is not diaphoretic.  Psychiatric: She has a normal mood and affect. Judgment normal.    ED Course  Procedures (including critical care time) Labs Review Labs Reviewed  POCT I-STAT, CHEM 8 - Abnormal; Notable for the following:    Sodium 134 (*)    Chloride 91 (*)    BUN 25 (*)    Glucose, Bld 123 (*)    Hemoglobin 15.6 (*)    All other components within normal limits   Imaging Review Dg Chest 2 View  05/16/2013   CLINICAL  DATA:  Coughing.  EXAM: CHEST  2 VIEW  COMPARISON:   03/08/2010.  FINDINGS: Mediastinum unremarkable. Mild right perihilar atelectasis versus infiltrate. Mild left base subsegmental atelectasis versus infiltrate. Questionable nodule right upper lobe. Follow-up chest x-rays recommended to demonstrate clearing of these findings. If these findings do not clear chest CT suggested. Heart size and pulmonary vascularity normal. Mild apical pleural thickening noted consistent with scarring. No significant pleural effusion or pneumothorax. No acute bony abnormality identified. Degenerative changes thoracic spine. Surgical clips upper abdomen.  IMPRESSION: 1. Mild right perihilar and left lower lobe subsegmental atelectasis versus infiltrate. 2. Possible small pulmonary nodule right upper lobe. Follow-up chest x-rays recommended to demonstrate clearing of these findings. If these findings do not clear, chest CT would be needed for further evaluation.   Electronically Signed   By: Marcello Moores  Register   On: 05/16/2013 14:40    EKG Interpretation    Date/Time:    Ventricular Rate:    PR Interval:    QRS Duration:   QT Interval:    QTC Calculation:   R Axis:     Text Interpretation:                MDM   1. CAP (community acquired pneumonia)    X-ray indicates probable pneumonia given her respiratory symptoms. She is afebrile, nontoxic. Labs are normal with pneumonia severity index of 1. She should be fine to treat this as an outpatient. She will followup immediately if she has any worsening in her condition. Otherwise she will followup with her primary care physician in one week.  Reviewed in detail other constitutional symptoms of malignancy, she denies all of these. I have informed her about the right upper lobe nodule the radiologist remarked on, she will followup at 3 and 6 weeks to have his chest x-ray repeated to ensure resolution.   Meds ordered this encounter  Medications  . ipratropium (ATROVENT) nebulizer solution 0.5 mg    Sig:    And    . albuterol (PROVENTIL) (5 MG/ML) 0.5% nebulizer solution 5 mg    Sig:   . DISCONTD: moxifloxacin (AVELOX) 400 MG tablet    Sig: Take 1 tablet (400 mg total) by mouth daily at 8 pm.    Dispense:  7 tablet    Refill:  0    Order Specific Question:  Supervising Provider    Answer:  Billy Fischer [phone]  . DISCONTD: predniSONE (DELTASONE) 50 MG tablet    Sig: Take 1 tablet (50 mg total) by mouth daily with breakfast.    Dispense:  5 tablet    Refill:  0    Order Specific Question:  Supervising Provider    Answer:  Billy Fischer [phone]  . predniSONE (DELTASONE) 50 MG tablet    Sig: Take 1 tablet (50 mg total) by mouth daily with breakfast.    Dispense:  5 tablet    Refill:  0    Order Specific Question:  Supervising Provider    Answer:  Billy Fischer [phone]  . moxifloxacin (AVELOX) 400 MG tablet    Sig: Take 1 tablet (400 mg total) by mouth daily at 8 pm.    Dispense:  7 tablet    Refill:  0    Order Specific Question:  Supervising Provider    Answer:  Ihor Gully D Boyd, PA-C 05/16/13 [phone]

## 2013-05-23 ENCOUNTER — Ambulatory Visit (INDEPENDENT_AMBULATORY_CARE_PROVIDER_SITE_OTHER): Payer: Medicare Other | Admitting: Family Medicine

## 2013-05-23 ENCOUNTER — Encounter: Payer: Self-pay | Admitting: Family Medicine

## 2013-05-23 VITALS — BP 140/70 | HR 62 | Temp 98.2°F | Ht 62.0 in | Wt 164.0 lb

## 2013-05-23 DIAGNOSIS — R911 Solitary pulmonary nodule: Secondary | ICD-10-CM | POA: Insufficient documentation

## 2013-05-23 DIAGNOSIS — J189 Pneumonia, unspecified organism: Secondary | ICD-10-CM

## 2013-05-23 MED ORDER — LEVOFLOXACIN 500 MG PO TABS: 500.0000 mg | ORAL_TABLET | Freq: Every day | ORAL | Status: DC

## 2013-05-23 NOTE — Progress Notes (Signed)
   Subjective:    Patient ID: Janet Chapman, female    DOB: 1935/12/12, 78 y.o.   MRN: [id number]  HPI  79 year old female presents for urgent Care follow up on 1/2 where she was diagnosed with CA PNA. Symptoms started on 12/23 initially. ST, productive cough, fatigue  CXR: IMPRESSION: 1. Mild right perihilar and left lower lobe subsegmental atelectasis versus infiltrate. 2. Possible small pulmonary nodule right upper lobe. Follow-up chest x-rays recommended to demonstrate clearing of these findings. If these findings do not clear, chest CT would be needed for further evaluation.   Started on prednisone and avelox x 7 days. She reports today that  she is some better but not muchat this point. She contiues to have productive cough, nasal congestion. No fever on antibiotics. She is very weak.  Nonsmoker.       Review of Systems  Constitutional: Negative for fever and fatigue.  HENT: Negative for ear pain.   Eyes: Negative for pain.  Respiratory: Negative for chest tightness and shortness of breath.   Cardiovascular: Negative for chest pain, palpitations and leg swelling.  Gastrointestinal: Negative for abdominal pain.  Genitourinary: Negative for dysuria.       Objective:   Physical Exam  Constitutional: Vital signs are normal. She appears well-developed and well-nourished. She is cooperative.  Non-toxic appearance. She does not appear ill. No distress.  HENT:  Head: Normocephalic.  Right Ear: Hearing, tympanic membrane, external ear and ear canal normal. Tympanic membrane is not erythematous, not retracted and not bulging.  Left Ear: Hearing, tympanic membrane, external ear and ear canal normal. Tympanic membrane is not erythematous, not retracted and not bulging.  Nose: No mucosal edema or rhinorrhea. Right sinus exhibits no maxillary sinus tenderness and no frontal sinus tenderness. Left sinus exhibits no maxillary sinus tenderness and no frontal sinus tenderness.    Mouth/Throat: Uvula is midline, oropharynx is clear and moist and mucous membranes are normal.  Eyes: Conjunctivae, EOM and lids are normal. Pupils are equal, round, and reactive to light. Lids are everted and swept, no foreign bodies found.  Neck: Trachea normal and normal range of motion. Neck supple. Carotid bruit is not present. No mass and no thyromegaly present.  Cardiovascular: Normal rate, regular rhythm, S1 normal, S2 normal, normal heart sounds, intact distal pulses and normal pulses.  Exam reveals no gallop and no friction rub.   No murmur heard. Pulmonary/Chest: Effort normal and breath sounds normal. Not tachypneic. No respiratory distress. She has no decreased breath sounds. She has no wheezes. She has no rhonchi. She has no rales.  Chest congestion, frequent moist cough  Abdominal: Soft. Normal appearance and bowel sounds are normal. There is no tenderness.  Neurological: She is alert.  Skin: Skin is warm, dry and intact. No rash noted.  Psychiatric: Her speech is normal and behavior is normal. Judgment and thought content normal. Her mood appears not anxious. Cognition and memory are normal. She does not exhibit a depressed mood.          Assessment & Plan:

## 2013-05-23 NOTE — Patient Instructions (Signed)
Mucinex to loosen mucus.  Rest, fluids. Complete levaquin x 10 days. Follow up in 10 for re-eval with repeat CXR.

## 2013-05-23 NOTE — Assessment & Plan Note (Signed)
Minimal improvement on avelox. Will change to levaquin x 10 days.  No SOB or wheeze.

## 2013-05-23 NOTE — Progress Notes (Signed)
Pre-visit discussion using our clinic review tool. No additional management support is needed unless otherwise documented below in the visit note.  

## 2013-05-23 NOTE — Assessment & Plan Note (Signed)
Will re-eval for clearing with CXR in 2weeks.

## 2013-06-05 ENCOUNTER — Other Ambulatory Visit: Payer: Self-pay | Admitting: *Deleted

## 2013-06-05 MED ORDER — SIMVASTATIN 40 MG PO TABS: 40.0000 mg | ORAL_TABLET | Freq: Every day | ORAL | Status: DC

## 2013-06-06 ENCOUNTER — Encounter: Payer: Self-pay | Admitting: Family Medicine

## 2013-06-06 ENCOUNTER — Ambulatory Visit (INDEPENDENT_AMBULATORY_CARE_PROVIDER_SITE_OTHER): Payer: Medicare Other | Admitting: Family Medicine

## 2013-06-06 ENCOUNTER — Telehealth: Payer: Self-pay | Admitting: Family Medicine

## 2013-06-06 ENCOUNTER — Ambulatory Visit (INDEPENDENT_AMBULATORY_CARE_PROVIDER_SITE_OTHER)
Admission: RE | Admit: 2013-06-06 | Discharge: 2013-06-06 | Disposition: A | Discharge status: Home / Self Care | Payer: Medicare Other | Source: Ambulatory Visit | Attending: Family Medicine | Admitting: Family Medicine

## 2013-06-06 VITALS — BP 114/70 | HR 54 | Temp 98.4°F | Ht 62.0 in | Wt 166.2 lb

## 2013-06-06 DIAGNOSIS — J189 Pneumonia, unspecified organism: Secondary | ICD-10-CM

## 2013-06-06 DIAGNOSIS — R911 Solitary pulmonary nodule: Secondary | ICD-10-CM

## 2013-06-06 DIAGNOSIS — M2550 Pain in unspecified joint: Secondary | ICD-10-CM

## 2013-06-06 MED ORDER — MELOXICAM 15 MG PO TABS: 7.5000 mg | ORAL_TABLET | Freq: Every day | ORAL | Status: DC

## 2013-06-06 NOTE — Progress Notes (Signed)
Subjective:    Patient ID: Janet Chapman, female    DOB: May 24, 1935, 78 y.o.   MRN: [id number]  HPI 78 year old female presents for follow up PNA. She is S/P prednisone, avelox x 7 days.. Minimal improvement then and levaquin x 10 days. Symptoms started on 12/23 initially. ST, productive cough, fatigue   She reports:  continued improvement of cough, occ dry cough.  No SOB, no wheeze.  No fever.  CXR: IMPRESSION: 1. Mild right perihilar and left lower lobe subsegmental atelectasis versus infiltrate. 2. Possible small pulmonary nodule right upper lobe. Follow-up chest x-rays recommended to demonstrate clearing of these findings. If these findings do not clear, chest CT would be needed for further evaluation.    Repeat CXR today to re-eval left pulm nodule  On my read... Looks clear... Will send to radiology for their review.   She has also noted pain in right lateral hip, right shoulder pain.  Now moved to left hip. Pain with lying on B hips. No redness, no swelling in joints. Some pain with walking in right hip  Completed antibiotics 1 week ago.   Tylenol helped her sleep but not much with pain.   Review of Systems  Constitutional: Negative for fever and fatigue.  HENT: Negative for ear pain.   Eyes: Negative for pain.  Respiratory: Negative for chest tightness and shortness of breath.   Cardiovascular: Negative for chest pain, palpitations and leg swelling.  Gastrointestinal: Negative for abdominal pain.  Genitourinary: Negative for dysuria.       Objective:   Physical Exam  Constitutional: She is oriented to person, place, and time. Vital signs are normal. She appears well-developed and well-nourished. She is cooperative.  Non-toxic appearance. She does not appear ill. No distress.  HENT:  Head: Normocephalic.  Right Ear: Hearing, tympanic membrane, external ear and ear canal normal. Tympanic membrane is not erythematous, not retracted and not bulging.  Left Ear:  Hearing, tympanic membrane, external ear and ear canal normal. Tympanic membrane is not erythematous, not retracted and not bulging.  Nose: No mucosal edema or rhinorrhea. Right sinus exhibits no maxillary sinus tenderness and no frontal sinus tenderness. Left sinus exhibits no maxillary sinus tenderness and no frontal sinus tenderness.  Mouth/Throat: Uvula is midline, oropharynx is clear and moist and mucous membranes are normal.  Eyes: Conjunctivae, EOM and lids are normal. Pupils are equal, round, and reactive to light. Lids are everted and swept, no foreign bodies found.  Neck: Trachea normal and normal range of motion. Neck supple. Carotid bruit is not present. No mass and no thyromegaly present.  Cardiovascular: Normal rate, regular rhythm, S1 normal, S2 normal, normal heart sounds, intact distal pulses and normal pulses.  Exam reveals no gallop and no friction rub.   No murmur heard. Pulmonary/Chest: Effort normal and breath sounds normal. Not tachypneic. No respiratory distress. She has no decreased breath sounds. She has no wheezes. She has no rhonchi. She has no rales.  Abdominal: Soft. Normal appearance and bowel sounds are normal. There is no tenderness.  Musculoskeletal:       Right shoulder: She exhibits tenderness. She exhibits normal range of motion.       Right hip: She exhibits tenderness and bony tenderness. She exhibits normal range of motion.       Left hip: She exhibits tenderness and bony tenderness. She exhibits normal range of motion and normal strength.  Neurological: She is alert and oriented to person, place, and time. No cranial  nerve deficit.  Skin: Skin is warm, dry and intact. No rash noted.  Psychiatric: Her speech is normal and behavior is normal. Judgment and thought content normal. Her mood appears not anxious. Cognition and memory are normal. She does not exhibit a depressed mood.          Assessment & Plan:

## 2013-06-06 NOTE — Assessment & Plan Note (Signed)
Possible SE to meds vs post infectious inflammatory arthralgia.  Treat with meloxicam.  No sign of septic joint.

## 2013-06-06 NOTE — Telephone Encounter (Signed)
Message copied by [name] on Fri Jun 06, 2013  4:14 PM ------      Message from: Carter Kitten      Created: Fri Jun 06, 2013  3:55 PM       Patient notified as instructed by telephone.  Ms. Malay is willing to go ahead and get the CT to evaluate nodule further.  Advised Rosaria Ferries will be calling her once she gets this appointment scheduled. ------

## 2013-06-06 NOTE — Patient Instructions (Addendum)
Start meloxicam for joint ache. Call if not improving in 2 weeks. Take with food and stop if stomach irritation. We will call with X-ray results.

## 2013-06-06 NOTE — Assessment & Plan Note (Signed)
Symptoms resolved, appear resolved on CXR, final pending.

## 2013-06-06 NOTE — Assessment & Plan Note (Signed)
CXR  Pending.

## 2013-06-06 NOTE — Progress Notes (Signed)
Pre-visit discussion using our clinic review tool. No additional management support is needed unless otherwise documented below in the visit note.  

## 2013-06-13 ENCOUNTER — Encounter: Payer: Self-pay | Admitting: Family Medicine

## 2013-06-13 ENCOUNTER — Ambulatory Visit (INDEPENDENT_AMBULATORY_CARE_PROVIDER_SITE_OTHER)
Admission: RE | Admit: 2013-06-13 | Discharge: 2013-06-13 | Disposition: A | Discharge status: Home / Self Care | Payer: Medicare Other | Source: Ambulatory Visit | Attending: Family Medicine | Admitting: Family Medicine

## 2013-06-13 DIAGNOSIS — R911 Solitary pulmonary nodule: Secondary | ICD-10-CM

## 2013-06-20 ENCOUNTER — Other Ambulatory Visit (INDEPENDENT_AMBULATORY_CARE_PROVIDER_SITE_OTHER): Payer: Medicare Other

## 2013-06-20 ENCOUNTER — Telehealth: Payer: Self-pay | Admitting: Family Medicine

## 2013-06-20 DIAGNOSIS — E785 Hyperlipidemia, unspecified: Secondary | ICD-10-CM

## 2013-06-20 DIAGNOSIS — M8580 Other specified disorders of bone density and structure, unspecified site: Secondary | ICD-10-CM

## 2013-06-20 DIAGNOSIS — E119 Type 2 diabetes mellitus without complications: Secondary | ICD-10-CM

## 2013-06-20 LAB — LIPID PANEL
Cholesterol: 179 mg/dL (ref 0–200)
HDL: 67.4 mg/dL
LDL Cholesterol: 96 mg/dL (ref 0–99)
Total CHOL/HDL Ratio: 3
Triglycerides: 76 mg/dL (ref 0.0–149.0)
VLDL: 15.2 mg/dL (ref 0.0–40.0)

## 2013-06-20 LAB — COMPREHENSIVE METABOLIC PANEL
ALT: 19 U/L (ref 0–35)
AST: 22 U/L (ref 0–37)
Albumin: 4.2 g/dL (ref 3.5–5.2)
Alkaline Phosphatase: 63 U/L (ref 39–117)
BUN: 24 mg/dL — ABNORMAL HIGH (ref 6–23)
CO2: 32 mEq/L (ref 19–32)
Calcium: 9.8 mg/dL (ref 8.4–10.5)
Chloride: 101 mEq/L (ref 96–112)
Creatinine, Ser: 0.8 mg/dL (ref 0.4–1.2)
GFR: 72.14 mL/min (ref >60.00)
Glucose, Bld: 97 mg/dL (ref 70–99)
Potassium: 4 mEq/L (ref 3.5–5.1)
Sodium: 140 mEq/L (ref 135–145)
Total Bilirubin: 0.8 mg/dL (ref 0.3–1.2)
Total Protein: 7.1 g/dL (ref 6.0–8.3)

## 2013-06-20 LAB — HEMOGLOBIN A1C: Hgb A1c MFr Bld: 6.6 % — ABNORMAL HIGH (ref 4.6–6.5)

## 2013-06-20 NOTE — Telephone Encounter (Signed)
Message copied by [name] on Fri Jun 20, 2013  8:31 AM ------      Message from: Ellamae Sia      Created: Tue Jun 10, 2013  5:25 PM      Regarding: Lab orders for Friday, 2.6.15       Patient is scheduled for CPX labs, please order future labs, Thanks , Terri       ------

## 2013-06-21 LAB — VITAMIN D 25 HYDROXY (VIT D DEFICIENCY, FRACTURES): Vit D, 25-Hydroxy: 58 ng/mL (ref 30–89)

## 2013-06-23 ENCOUNTER — Other Ambulatory Visit: Payer: Medicare Other

## 2013-06-25 ENCOUNTER — Telehealth: Payer: Self-pay

## 2013-06-25 NOTE — Telephone Encounter (Signed)
Relevant patient education assigned to patient using Emmi. ° °

## 2013-06-26 ENCOUNTER — Ambulatory Visit (INDEPENDENT_AMBULATORY_CARE_PROVIDER_SITE_OTHER): Payer: Medicare Other | Admitting: Family Medicine

## 2013-06-26 ENCOUNTER — Encounter: Payer: Self-pay | Admitting: Family Medicine

## 2013-06-26 VITALS — BP 124/70 | HR 60 | Temp 97.6°F | Ht 62.5 in | Wt 165.5 lb

## 2013-06-26 DIAGNOSIS — Z0000 Encounter for general adult medical examination without abnormal findings: Secondary | ICD-10-CM

## 2013-06-26 DIAGNOSIS — Z23 Encounter for immunization: Secondary | ICD-10-CM

## 2013-06-26 MED ORDER — LOSARTAN POTASSIUM-HCTZ 100-25 MG PO TABS: 1.0000 | ORAL_TABLET | Freq: Every day | ORAL | Status: DC

## 2013-06-26 MED ORDER — METOPROLOL SUCCINATE ER 50 MG PO TB24: 50.0000 mg | ORAL_TABLET | Freq: Every day | ORAL | Status: DC

## 2013-06-26 NOTE — Progress Notes (Signed)
Pre-visit discussion using our clinic review tool. No additional management support is needed unless otherwise documented below in the visit note.  

## 2013-06-26 NOTE — Progress Notes (Signed)
I have personally reviewed the Medicare Annual Wellness questionnaire and have noted  1. The patient's medical and social history  2. Their use of alcohol, tobacco or illicit drugs  3. Their current medications and supplements  4. The patient's functional ability including ADL's, fall risks, home safety risks and hearing or visual  impairment.  5. Diet and physical activities  6. Evidence for depression or mood disorders  The patients weight, height, BMI and visual acuity have been recorded in the chart  I have made referrals, counseling and provided education to the patient based review of the above and I have provided the pt with a written personalized care plan for preventive services.   Recent PNA and post inflammatory arthralgia, resolved. No longer taking meloxicam.  Pulmonary nodule.. CT reassuring , no further eval needed.  PN:TIRW controlled. Does not check blood sugar at home.  Lab Results  Component Value Date   HGBA1C 6.6* 06/20/2013  Working on lifestyle changes  Has not been doing as well with diet changes.  Exercise: None   High cholesterol: good control on simvastatin 40 mg daily.  Lab Results  Component Value Date   CHOL 179 06/20/2013   HDL 67.40 06/20/2013   LDLCALC 96 06/20/2013   LDLDIRECT 119.8 04/26/2009   TRIG 76.0 06/20/2013   CHOLHDL 3 06/20/2013  No myalgia.   HTN: Well controlled on diltiazem, losartan, HCTZ metoprolol.  Saw Dr. Rockey Situ for palpitations in 2012... using metoprolol extra 50 mg rarely because pulse okay. Has not been back to see him.  No CP with exertion. No SOB. No edema.   BP Readings from Last 3 Encounters:  06/26/13 124/70  06/06/13 114/70  05/23/13 140/70  Diet: Fruit and veggies, no FF, some water, artificial sweetener   Review of Systems  General: Denies fatigue and fever.  No issues with sleep. CV: Denies chest pain or discomfort and swelling of feet.  Resp: Denies shortness of breath.  GI: Denies abdominal pain.  GU: Denies  dysuria.   Physical Exam  General: Well-developed,well-nourished,in no acute distress; alert,appropriate and cooperative throughout examination  Ears: External ear exam shows no significant lesions or deformities. Otoscopic examination reveals clear canals, tympanic membranes are intact bilaterally without bulging, retraction, inflammation or discharge. Hearing is grossly normal bilaterally.  Nose: External nasal examination shows no deformity or inflammation. Nasal mucosa are pink and moist without lesions or exudates.  Mouth: Oral mucosa and oropharynx without lesions or exudates. Teeth in good repair.  Neck: no carotid bruit or thyromegaly no cervical or supraclavicular lymphadenopathy  Chest Wall: No deformities, masses, or tenderness noted.  Breasts: No mass, nodules, thickening, tenderness, bulging, retraction, inflamation, nipple discharge or skin changes noted.  Lungs: Normal respiratory effort, chest expands symmetrically. Lungs are clear to auscultation, no crackles or wheezes.  Heart: Normal rate and regular rhythm. S1 and S2 normal without gallop, murmur, click, rub or other extra sounds.  Abdomen: Bowel sounds positive,abdomen soft and non-tender without masses, organomegaly or hernias noted.  Genitalia: not indicated  Pulses: R and L posterior tibial pulses are full and equal bilaterally  Extremities: no edema  Neurologic: No cranial nerve deficits noted. Station and gait are normal. Plantar reflexes are down-going bilaterally. DTRs are symmetrical throughout. Sensory, motor and coordinative functions appear intact.  Skin: Intact without suspicious lesions or rashes  Psych: Cognition and judgment appear intact. Alert and cooperative with normal attention span and concentration. No apparent delusions, illusions, hallucinations   Diabetic foot exam:  Normal inspection  No skin breakdown  No calluses  Normal DP pulses  Normal sensation to light touch and monofilament  Nails  normal   A/P  The patient's preventative maintenance and recommended screening tests for an annual wellness exam were reviewed in full today.  Brought up to date unless services declined.  Counselled on the importance of diet, exercise, and its role in overall health and mortality.  The patient's FH and SH was reviewed, including their home life, tobacco status, and drug and alcohol status.   Vaccines:uptodate with flu, Td, shingles and pneumovax,, will give prevnar today. Mammo: 04/2013 nml DVE: pap and DVE not indicated. TAH. Sister with ovarian cancer  DEXA:mild osteopenia 07/2012, work on walking, Ca and Vit D Colon: 02/01/2005  Nml, repeat in 10 years Nonsmoker

## 2013-06-26 NOTE — Patient Instructions (Signed)
Work on low [employer], get back on track with exercise.

## 2013-06-26 NOTE — Addendum Note (Signed)
Addended by: Carter Kitten on: 06/26/2013 11:15 AM   Modules accepted: Orders

## 2013-07-09 ENCOUNTER — Ambulatory Visit (INDEPENDENT_AMBULATORY_CARE_PROVIDER_SITE_OTHER): Payer: Medicare Other | Admitting: Family Medicine

## 2013-07-09 ENCOUNTER — Telehealth: Payer: Self-pay | Admitting: Family Medicine

## 2013-07-09 ENCOUNTER — Encounter: Payer: Self-pay | Admitting: Family Medicine

## 2013-07-09 VITALS — BP 122/62 | HR 64 | Temp 98.0°F | Ht 62.0 in | Wt 159.8 lb

## 2013-07-09 DIAGNOSIS — R112 Nausea with vomiting, unspecified: Secondary | ICD-10-CM

## 2013-07-09 MED ORDER — PROMETHAZINE HCL 12.5 MG PO TABS: 12.5000 mg | ORAL_TABLET | Freq: Four times a day (QID) | ORAL | Status: DC | PRN

## 2013-07-09 NOTE — Progress Notes (Signed)
(S) Janet Chapman is a 78 y.o. female with complaint of gastrointestinal symptoms of watery diarrhea, nausea, vomiting, weight loss for 4 days. No blood in stool.  Did have a dark stool this morning but took pepto bismol last night.  Actually felt better yesterday than drank sauerkraut juice today after someone told her it would help with her nausea- vomited immediately after drinking it.  Patient Active Problem List   Diagnosis Date Noted  . Arthralgia 06/06/2013  . Pulmonary nodule, right 05/23/2013  . PNA (pneumonia) 05/23/2013  . De Quervain's tenosynovitis, left 04/20/2013  . Tinea pedis 12/20/2012  . Osteopenia 07/26/2012  . INSOMNIA, CHRONIC 01/14/2010  . DIABETES MELLITUS, TYPE II 11/11/2008  . MAMMOGRAM, ABNORMAL, LEFT 11/09/2008  . CONSTIPATION 09/11/2007  . OBESITY 05/31/2007  . ALLERGIC RHINITIS 05/31/2007  . GERD 05/31/2007  . OSTEOARTHRITIS 05/31/2007  . URINARY INCONTINENCE 05/31/2007  . HYPERLIPIDEMIA 04/19/2007  . HYPERTENSION 04/19/2007  . ACTINIC KERATOSIS 02/01/2007  . SEBORRHEIC KERATOSIS 02/01/2007   Past Medical History  Diagnosis Date  . Hyperlipidemia   . Hypertension   . Urinary incontinence   . Allergic rhinitis   . GERD (gastroesophageal reflux disease)   . Osteoarthritis    Past Surgical History  Procedure Laterality Date  . Cardiovascular stress test  2004    [facility], cardiolite-normal  . Cholecystectomy  [phone]  . Abdominal hysterectomy  1993    TA   History  Substance Use Topics  . Smoking status: Never Smoker   . Smokeless tobacco: Never Used  . Alcohol Use: No   Family History  Problem Relation Age of Onset  . Heart attack Father 59  . Dementia Mother   . Stroke Mother 63  . Ovarian cancer Sister 61  . Diabetes      Maternal side  . Ovarian cancer      Grandmother  . Ovarian cancer      aunt   Allergies  Allergen Reactions  . Codeine     REACTION: nausea   Current Outpatient Prescriptions on File Prior to Visit   Medication Sig Dispense Refill  . aspirin 81 MG tablet Take 81 mg by mouth daily.        . Calcium Carbonate-Vitamin D (CALCIUM 600+D) 600-200 MG-UNIT TABS Take 1 tablet by mouth daily.        . Coenzyme Q10 150 MG CAPS Take 1 capsule by mouth daily.        Marland Kitchen diltiazem (CARDIZEM) 120 MG tablet Take 1 tablet (120 mg total) by mouth daily.  90 tablet  1  . fish oil-omega-3 fatty acids 1000 MG capsule Take 2 g by mouth daily.        . Glucosamine-Chondroit-Vit C-Mn (GLUCOSAMINE CHONDR 1500 COMPLX PO) Take 1 tablet by mouth daily.      Marland Kitchen glucose blood test strip Use as instructed to check blood sugar once a day       . losartan-hydrochlorothiazide (HYZAAR) 100-25 MG per tablet Take 1 tablet by mouth daily.  90 tablet  3  . LUTEIN PO Take one by mouth daily      . metoprolol succinate (TOPROL-XL) 50 MG 24 hr tablet Take 1 tablet (50 mg total) by mouth daily.  90 tablet  3  . Multiple Vitamin (MULTIVITAMIN) tablet Take 1 tablet by mouth daily.        Marland Kitchen omeprazole (PRILOSEC) 20 MG capsule Take 2 capsules (40 mg total) by mouth daily.  180 capsule  1  .  simvastatin (ZOCOR) 40 MG tablet Take 1 tablet (40 mg total) by mouth at bedtime.  90 tablet  1   No current facility-administered medications on file prior to visit.   The PMH, PSH, Social History, Family History, Medications, and allergies have been reviewed in [facility], and have been updated if relevant.   (O) BP 122/62  Pulse 64  Temp(Src) 98 F (36.7 C) (Oral)  Ht 5\' 2"  (1.575 m)  Wt 159 lb 12 oz (72.462 kg)  BMI 29.21 kg/m2  SpO2 95% Wt Readings from Last 3 Encounters:  07/09/13 159 lb 12 oz (72.462 kg)  06/26/13 165 lb 8 oz (75.07 kg)  06/06/13 166 lb 4 oz (75.411 kg)      Physical exam reveals the patient appears well. Hydration status: well hydrated. Abdomen: abdomen is soft without significant tenderness, masses, organomegaly or guarding..  (A) Viral Gastroenteritis  (P)  Phenergan as needed for nausea. I have recommended  small amounts clear fluids frequently, soups, juices, water and advance diet as tolerated. Return office visit if symptoms persist or worsen; I have alerted the patient to call if high fever, dehydration, marked weakness, fainting, increased abdominal pain, blood in stool or vomit.

## 2013-07-09 NOTE — Patient Instructions (Signed)
Good to see you. Stay hydrated.  Take phenergan as needed. If you get worse, you need to go to the ER.     RECOMMENDED FOODS AND BEVERAGES Starches Choose foods with less than 2 g of fiber per serving.  Recommended:  White, [place], and pita breads, plain rolls, buns, bagels. Plain muffins, matzo. Soda, saltine, or graham crackers. Pretzels, melba toast, zwieback. Cooked cereals made with water: cornmeal, farina, cream cereals. Dry cereals: refined corn, wheat, rice. Potatoes prepared any way without skins, refined macaroni, spaghetti, noodles, refined rice.  Avoid:  Bread, rolls, or crackers made with whole wheat, multi-grains, rye, bran seeds, nuts, or coconut. Corn tortillas or taco shells. Cereals containing whole grains, multi-grains, bran, coconut, nuts, raisins. Cooked or dry oatmeal. Coarse wheat cereals, granola. Cereals advertised as "high-fiber." Potato skins. Whole grain pasta, wild or brown rice. Popcorn. Sweet potatoes, yams. Sweet rolls, doughnuts, waffles, pancakes, sweet breads. Vegetables  Recommended: Strained tomato and vegetable juices. Most well-cooked and canned vegetables without seeds. Fresh: Tender lettuce, cucumber without the skin, cabbage, spinach, bean sprouts.  Avoid: Fresh, cooked, or canned: Artichokes, baked beans, beet greens, broccoli, Brussels sprouts, corn, kale, legumes, peas, sweet potatoes. Cooked: Green or red cabbage, spinach. Avoid large servings of any vegetables because vegetables shrink when cooked, and they contain more fiber per serving than fresh vegetables. Fruit  Recommended: Cooked or canned: Apricots, applesauce, cantaloupe, cherries, fruit cocktail, grapefruit, grapes, kiwi, mandarin oranges, peaches, pears, plums, watermelon. Fresh: Apples without skin, ripe banana, grapes, cantaloupe, cherries, grapefruit, peaches, oranges, plums. Keep servings limited to  cup or 1 piece.  Avoid: Fresh: Apples with skin, apricots, mangoes, pears,  raspberries, strawberries. Prune juice, stewed or dried prunes. Dried fruits, raisins, dates. Large servings of all fresh fruits. Protein  Recommended: Ground or well-cooked tender beef, ham, veal, lamb, pork, or poultry. Eggs. Fish, oysters, shrimp, lobster, other seafoods. Liver, organ meats.  Avoid: Tough, fibrous meats with gristle. Peanut butter, smooth or chunky. Cheese, nuts, seeds, legumes, dried peas, beans, lentils. Dairy  Recommended: Yogurt, lactose-free milk, kefir, drinkable yogurt, buttermilk, soy milk, or plain hard cheese.  Avoid: Milk, chocolate milk, beverages made with milk, such as milkshakes. Soups  Recommended: Bouillon, broth, or soups made from allowed foods. Any strained soup.  Avoid: Soups made from vegetables that are not allowed, cream or milk-based soups. Desserts and Sweets  Recommended: Sugar-free gelatin, sugar-free frozen ice pops made without sugar alcohol.  Avoid: Plain cakes and cookies, pie made with fruit, pudding, custard, cream pie. Gelatin, fruit, ice, sherbet, frozen ice pops. Ice cream, ice milk without nuts. Plain hard candy, honey, jelly, molasses, syrup, sugar, chocolate syrup, gumdrops, marshmallows. Fats and Oils  Recommended: Limit fats to less than 8 tsp per day.  Avoid: Seeds, nuts, olives, avocados. Margarine, butter, cream, mayonnaise, salad oils, plain salad dressings. Plain gravy, crisp bacon without rind. Beverages  Recommended: Water, decaffeinated teas, oral rehydration solutions, sugar-free beverages not sweetened with sugar alcohols.  Avoid: Fruit juices, caffeinated beverages (coffee, tea, soda), alcohol, sports drinks, or lemon-lime soda. Condiments  Recommended: Ketchup, mustard, horseradish, vinegar, cocoa powder. Spices in moderation: allspice, basil, bay leaves, celery powder or leaves, cinnamon, cumin powder, curry powder, ginger, mace, marjoram, onion or garlic powder, oregano, paprika, parsley flakes, ground  pepper, rosemary, sage, savory, tarragon, thyme, turmeric.  Avoid: Coconut, honey. Document Released: 07/22/2003 Document Revised: 01/24/2012 Document Reviewed: 09/15/2011 [facility] Patient Information 2014 [place].

## 2013-07-09 NOTE — Telephone Encounter (Signed)
Patient Information:  Caller Name: Nataliya  Phone: [phone]  Patient: Janet Chapman, Janet Chapman  Gender: Female  DOB: Oct 31, 1935  Age: 78 Years  PCP: [name] [facility])  Office Follow Up:  Does the office need to follow up with this patient?: No  Instructions For The Office: N/A  RN Note:  Has not been monitoring blood sugars.   Triaged in off line diabetes gastrointestinal Sx.  Symptoms  Reason For Call & Symptoms: Ate a large lunch on Sunday 2/22 then started vomiting and diarrhea by evening.  Vomiting and diarrhea evantually stopped but Monday night 2/23.  Today 2/25 nausea all day.  Just tried friend's remedy and started vomiting again.  Reviewed Health History In EMR: Yes  Reviewed Medications In EMR: Yes  Reviewed Allergies In EMR: Yes  Reviewed Surgeries / Procedures: Yes  Date of Onset of Symptoms: 07/06/2013  Treatments Tried: gingerale, crackers saurkraut juice  Treatments Tried Worked: No  Guideline(s) Used:  Vomiting  Disposition Per Guideline:   Home Care  Reason For Disposition Reached:   Vomiting  Advice Given:  N/A  Patient Will Follow Care Advice:  YES  Appt 3:45 pm today Dr Deborra Medina

## 2013-07-09 NOTE — Progress Notes (Signed)
Pre-visit discussion using our clinic review tool. No additional management support is needed unless otherwise documented below in the visit note.  

## 2013-07-24 ENCOUNTER — Encounter: Payer: Medicare Other | Admitting: Family Medicine

## 2013-08-06 ENCOUNTER — Other Ambulatory Visit: Payer: Self-pay | Admitting: *Deleted

## 2013-08-06 MED ORDER — OMEPRAZOLE 20 MG PO CPDR: 20.0000 mg | DELAYED_RELEASE_CAPSULE | Freq: Every day | ORAL | Status: DC

## 2013-08-06 NOTE — Telephone Encounter (Signed)
Patient received letter from [employer] stating they would only cover omeprazole 20 mg one time a day.  Medication list updated and new prescription sent to [facility].

## 2013-09-26 ENCOUNTER — Encounter: Payer: Self-pay | Admitting: Family Medicine

## 2013-09-26 ENCOUNTER — Ambulatory Visit (INDEPENDENT_AMBULATORY_CARE_PROVIDER_SITE_OTHER): Payer: Medicare Other | Admitting: Family Medicine

## 2013-09-26 VITALS — BP 120/70 | HR 54 | Temp 98.4°F | Ht 62.5 in | Wt 161.8 lb

## 2013-09-26 DIAGNOSIS — B353 Tinea pedis: Secondary | ICD-10-CM

## 2013-09-26 DIAGNOSIS — N952 Postmenopausal atrophic vaginitis: Secondary | ICD-10-CM

## 2013-09-26 DIAGNOSIS — R110 Nausea: Secondary | ICD-10-CM

## 2013-09-26 DIAGNOSIS — K5900 Constipation, unspecified: Secondary | ICD-10-CM

## 2013-09-26 DIAGNOSIS — K5904 Chronic idiopathic constipation: Secondary | ICD-10-CM

## 2013-09-26 DIAGNOSIS — R300 Dysuria: Secondary | ICD-10-CM

## 2013-09-26 LAB — POCT URINALYSIS DIPSTICK
Bilirubin, UA: NEGATIVE
Glucose, UA: NEGATIVE
Ketones, UA: NEGATIVE
Leukocytes, UA: NEGATIVE
Nitrite, UA: NEGATIVE
Protein, UA: NEGATIVE
Spec Grav, UA: 1.01
Urobilinogen, UA: 0.2
pH, UA: 6

## 2013-09-26 MED ORDER — ESTRADIOL 0.1 MG/GM VA CREA: TOPICAL_CREAM | VAGINAL | Status: DC

## 2013-09-26 NOTE — Patient Instructions (Addendum)
Try miralax daily for constipation. Increase water, fiber in diet. Start daily walking. If severely constipated try milk of magnesia.  if nausea not improved with improvement in constipation... Can try to increase prilosec back to 40 mg daily x 2-4 weeks.  Topical vit E oil for nail splitting.  Apply clotrimazole cream to right foot twice day for minimum 4 weeks, if not improving call. Start topical estrogen cream as directed. Follow up in month.

## 2013-09-26 NOTE — Assessment & Plan Note (Signed)
Likely due to constipation but if not improving increase GERD treatment back to prilosec 40 mg daily.  Recent CMET nml.  Follow up if not improving. Not abnormal weight loss Wt Readings from Last 3 Encounters:  09/26/13 161 lb 12 oz (73.369 kg)  07/09/13 159 lb 12 oz (72.462 kg)  06/26/13 165 lb 8 oz (75.07 kg)

## 2013-09-26 NOTE — Assessment & Plan Note (Signed)
Clear UA. Likely due to atrophic vaginitis.  Start estradiol cream taper down as able.

## 2013-09-26 NOTE — Assessment & Plan Note (Signed)
Use clotrimazole twice daily consistently x 4 weeks.

## 2013-09-26 NOTE — Assessment & Plan Note (Signed)
Treat with miralax daily. Increase water and fiber, exercise.

## 2013-09-26 NOTE — Progress Notes (Signed)
Pre visit review using our clinic review tool, if applicable. No additional management support is needed unless otherwise documented below in the visit note. 

## 2013-09-26 NOTE — Progress Notes (Signed)
   Subjective:    Patient ID: Janet Chapman, female    DOB: 1936-03-14, 78 y.o.   MRN: [id number]  HPI  78 year old female with well controlled DM, HTN, GERD and constipation presents with multiple medical issues.  She states in last 3-4 week she feels nauseous. Episodes last few min. Gingerale or food helps. No emesis. No abdominal pain. Taking prilosec 20 mg daily. She did have stomach virus  In 06/2013, vomiting and diarrhea, resolved completely. She does have constipation that she is using stool softner and laxative... Minimal help.  feels very full, last BM 3 days ago. She has never tried miralax.  Burning/itching in vaginal area after urinating x several months.  No discharge, she has chronic urinary incontinence. No dysuria.  Nails splitting in last 2 years.  Also itching and burning in right sole of foot. IMproves temporarily with clotrimazole cream, but not using regularly.    Review of Systems  Constitutional: Negative for fever and fatigue.  HENT: Negative for ear pain.   Eyes: Negative for pain.  Respiratory: Negative for chest tightness and shortness of breath.   Cardiovascular: Negative for chest pain, palpitations and leg swelling.  Gastrointestinal: Negative for abdominal pain.  Genitourinary: Negative for dysuria.       Objective:   Physical Exam  Constitutional: Vital signs are normal. She appears well-developed and well-nourished. She is cooperative.  Non-toxic appearance. She does not appear ill. No distress.  HENT:  Head: Normocephalic.  Right Ear: Hearing, tympanic membrane, external ear and ear canal normal. Tympanic membrane is not erythematous, not retracted and not bulging.  Left Ear: Hearing, tympanic membrane, external ear and ear canal normal. Tympanic membrane is not erythematous, not retracted and not bulging.  Nose: No mucosal edema or rhinorrhea. Right sinus exhibits no maxillary sinus tenderness and no frontal sinus tenderness. Left sinus  exhibits no maxillary sinus tenderness and no frontal sinus tenderness.  Mouth/Throat: Uvula is midline, oropharynx is clear and moist and mucous membranes are normal.  Eyes: Conjunctivae, EOM and lids are normal. Pupils are equal, round, and reactive to light. Lids are everted and swept, no foreign bodies found.  Neck: Trachea normal and normal range of motion. Neck supple. Carotid bruit is not present. No mass and no thyromegaly present.  Cardiovascular: Normal rate, regular rhythm, S1 normal, S2 normal, normal heart sounds, intact distal pulses and normal pulses.  Exam reveals no gallop and no friction rub.   No murmur heard. Pulmonary/Chest: Effort normal and breath sounds normal. Not tachypneic. No respiratory distress. She has no decreased breath sounds. She has no wheezes. She has no rhonchi. She has no rales.  Abdominal: Soft. Normal appearance and bowel sounds are normal. There is no tenderness.  Genitourinary:  Vaginal dryness and atrophy  Neurological: She is alert.  Skin: Skin is warm, dry and intact. No rash noted.  One small nail split on left 3rd digit  right foot dryt flaky skin on sole  Psychiatric: Her speech is normal and behavior is normal. Judgment and thought content normal. Her mood appears not anxious. Cognition and memory are normal. She does not exhibit a depressed mood.          Assessment & Plan:

## 2013-10-09 ENCOUNTER — Other Ambulatory Visit: Payer: Self-pay | Admitting: Dermatology

## 2013-10-22 ENCOUNTER — Other Ambulatory Visit: Payer: Self-pay | Admitting: *Deleted

## 2013-10-22 MED ORDER — DILTIAZEM HCL 120 MG PO TABS: 120.0000 mg | ORAL_TABLET | Freq: Every day | ORAL | Status: DC

## 2013-11-04 ENCOUNTER — Ambulatory Visit (INDEPENDENT_AMBULATORY_CARE_PROVIDER_SITE_OTHER): Payer: Medicare Other | Admitting: Family Medicine

## 2013-11-04 ENCOUNTER — Encounter: Payer: Self-pay | Admitting: Family Medicine

## 2013-11-04 VITALS — BP 126/66 | HR 52 | Temp 98.0°F | Ht 62.25 in | Wt 163.8 lb

## 2013-11-04 DIAGNOSIS — K5900 Constipation, unspecified: Secondary | ICD-10-CM

## 2013-11-04 DIAGNOSIS — B353 Tinea pedis: Secondary | ICD-10-CM

## 2013-11-04 DIAGNOSIS — N952 Postmenopausal atrophic vaginitis: Secondary | ICD-10-CM

## 2013-11-04 DIAGNOSIS — K5904 Chronic idiopathic constipation: Secondary | ICD-10-CM

## 2013-11-04 DIAGNOSIS — R110 Nausea: Secondary | ICD-10-CM

## 2013-11-04 NOTE — Assessment & Plan Note (Signed)
Resolving

## 2013-11-04 NOTE — Progress Notes (Signed)
   Subjective:    Patient ID: Janet Chapman, female    DOB: 1936-02-21, 78 y.o.   MRN: [id number]  HPI  78 year old female presents for 1 month follow up of multiple isses.  At last OV started on estrogen vaginally for atrophic vaginitis causing burning.  Started on clotrimazole for tinea pedis.  Nausea likely due to constipation  Started on mirilax, fiber and water. She has continued omeprazole daily. Wt Readings from Last 3 Encounters:  11/04/13 163 lb 12 oz (74.277 kg)  09/26/13 161 lb 12 oz (73.369 kg)  07/09/13 159 lb 12 oz (72.462 kg)    Today she reports vaginitis improved.  The cream is very expensive, using every other night now at 2 g.  Her tinea pedis has almost resolved. No itching. Constipation has resolved with miralax, uses as needed. Having daily BMs. Nausea is a lot better. No blood in stool. No abdominal pain.        Review of Systems  Constitutional: Negative for fever and fatigue.  HENT: Negative for ear pain.   Eyes: Negative for pain.  Respiratory: Negative for chest tightness and shortness of breath.   Cardiovascular: Negative for chest pain, palpitations and leg swelling.  Gastrointestinal: Negative for abdominal pain.  Genitourinary: Negative for dysuria.       Objective:   Physical Exam  Constitutional: Vital signs are normal. She appears well-developed and well-nourished. She is cooperative.  Non-toxic appearance. She does not appear ill. No distress.  HENT:  Head: Normocephalic.  Right Ear: Hearing, tympanic membrane, external ear and ear canal normal. Tympanic membrane is not erythematous, not retracted and not bulging.  Left Ear: Hearing, tympanic membrane, external ear and ear canal normal. Tympanic membrane is not erythematous, not retracted and not bulging.  Nose: No mucosal edema or rhinorrhea. Right sinus exhibits no maxillary sinus tenderness and no frontal sinus tenderness. Left sinus exhibits no maxillary sinus tenderness and no  frontal sinus tenderness.  Mouth/Throat: Uvula is midline, oropharynx is clear and moist and mucous membranes are normal.  Eyes: Conjunctivae, EOM and lids are normal. Pupils are equal, round, and reactive to light. Lids are everted and swept, no foreign bodies found.  Neck: Trachea normal and normal range of motion. Neck supple. Carotid bruit is not present. No mass and no thyromegaly present.  Cardiovascular: Normal rate, regular rhythm, S1 normal, S2 normal, normal heart sounds, intact distal pulses and normal pulses.  Exam reveals no gallop and no friction rub.   No murmur heard. Pulmonary/Chest: Effort normal and breath sounds normal. Not tachypneic. No respiratory distress. She has no decreased breath sounds. She has no wheezes. She has no rhonchi. She has no rales.  Abdominal: Soft. Normal appearance and bowel sounds are normal. There is no tenderness.  Neurological: She is alert.  Skin: Skin is warm, dry and intact. No rash noted.  Psychiatric: Her speech is normal and behavior is normal. Judgment and thought content normal. Her mood appears not anxious. Cognition and memory are normal. She does not exhibit a depressed mood.          Assessment & Plan:

## 2013-11-04 NOTE — Patient Instructions (Signed)
Wean down on vaginal cream.  Continue miralax, water and fiber. Complete clotrimazole course.  Okay to use ibuprofen occ for tooth pain with food.  Follow up as needed.

## 2013-11-04 NOTE — Progress Notes (Signed)
Pre visit review using our clinic review tool, if applicable. No additional management support is needed unless otherwise documented below in the visit note. 

## 2013-11-04 NOTE — Assessment & Plan Note (Signed)
Resolved

## 2013-11-04 NOTE — Assessment & Plan Note (Signed)
Improved. Wean down to lowest dose estrogen available.

## 2013-11-04 NOTE — Assessment & Plan Note (Signed)
Stable on miralax.

## 2013-11-12 LAB — HM DIABETES EYE EXAM

## 2013-11-18 ENCOUNTER — Encounter: Payer: Self-pay | Admitting: Family Medicine

## 2013-12-05 ENCOUNTER — Other Ambulatory Visit: Payer: Self-pay | Admitting: *Deleted

## 2013-12-05 MED ORDER — SIMVASTATIN 40 MG PO TABS: 40.0000 mg | ORAL_TABLET | Freq: Every day | ORAL | Status: DC

## 2013-12-12 ENCOUNTER — Telehealth: Payer: Self-pay

## 2013-12-12 NOTE — Telephone Encounter (Signed)
Marcie Bal with [facility] request info for estrace approval; advised form is on Dr Rometta Emery desk and she has been out of office this week and will return on 12/16/13. Marcie Bal will make notation and wait on info being faxed back.

## 2013-12-12 NOTE — Telephone Encounter (Signed)
Sherry with [facility] Medicare drug plan left v/m; estrace has been approved 12/12/13 - 12/13/14; letter of approval will follow. Any questions can contact [facility] [phone]. Spoke with [facility] pharmacy to notify of approval and  was advised pt has already picked up estrace; but ins is requiring pharmacy to run as 99 day prescription due to instructions for med. Pt wants to be charged one copay and ins is requiring 3 copays for 42.5 gm tube (smallest tube available is 42.5 gm). Butch Penny notified and she said paperwork on Dr Rometta Emery desk is for tier exception not prior auth.

## 2013-12-16 NOTE — Telephone Encounter (Signed)
Form completed in outbox.

## 2013-12-16 NOTE — Telephone Encounter (Signed)
Noted  

## 2013-12-16 NOTE — Telephone Encounter (Signed)
Correction.. Call to find put other formulary options and find out if pt agreeable to change. I do not believe pt has tried any other med. Insurance will not cover estrace unless one other formulary option tried and failed. Form in out box.

## 2013-12-16 NOTE — Telephone Encounter (Signed)
Spoke with Herbie Baltimore from [employer] who states they would never require the pharmacy to run a prescription for 99 days.  He also states that the Tier expception has already been approved.  He states they pharmacy can run this prescription thru as a 30 day supply and patient can get refills as needed or they can run it thru as a 90 day supply which would be 3 co-pays.  Called and spoke at [place] at [employer] and advised him next time [name] needs a refill to try and run it thru for a 30 day supply so patient will only have to pay one co-pay.  [name] notified.

## 2013-12-18 ENCOUNTER — Telehealth: Payer: Self-pay | Admitting: Family Medicine

## 2013-12-18 ENCOUNTER — Other Ambulatory Visit (INDEPENDENT_AMBULATORY_CARE_PROVIDER_SITE_OTHER): Payer: Medicare Other

## 2013-12-18 DIAGNOSIS — M8580 Other specified disorders of bone density and structure, unspecified site: Secondary | ICD-10-CM

## 2013-12-18 DIAGNOSIS — E119 Type 2 diabetes mellitus without complications: Secondary | ICD-10-CM

## 2013-12-18 DIAGNOSIS — E785 Hyperlipidemia, unspecified: Secondary | ICD-10-CM

## 2013-12-18 DIAGNOSIS — I10 Essential (primary) hypertension: Secondary | ICD-10-CM

## 2013-12-18 LAB — LIPID PANEL
Cholesterol: 158 mg/dL (ref 0–200)
HDL: 58.8 mg/dL (ref >39.00)
LDL Cholesterol: 89 mg/dL (ref 0–99)
NonHDL: 99.2
Total CHOL/HDL Ratio: 3
Triglycerides: 49 mg/dL (ref 0.0–149.0)
VLDL: 9.8 mg/dL (ref 0.0–40.0)

## 2013-12-18 LAB — COMPREHENSIVE METABOLIC PANEL
ALT: 16 U/L (ref 0–35)
AST: 21 U/L (ref 0–37)
Albumin: 4.3 g/dL (ref 3.5–5.2)
Alkaline Phosphatase: 56 U/L (ref 39–117)
BUN: 21 mg/dL (ref 6–23)
CO2: 33 mEq/L — ABNORMAL HIGH (ref 19–32)
Calcium: 9.7 mg/dL (ref 8.4–10.5)
Chloride: 100 mEq/L (ref 96–112)
Creatinine, Ser: 0.8 mg/dL (ref 0.4–1.2)
GFR: 74.12 mL/min (ref >60.00)
Glucose, Bld: 107 mg/dL — ABNORMAL HIGH (ref 70–99)
Potassium: 3.8 mEq/L (ref 3.5–5.1)
Sodium: 141 mEq/L (ref 135–145)
Total Bilirubin: 0.7 mg/dL (ref 0.2–1.2)
Total Protein: 6.9 g/dL (ref 6.0–8.3)

## 2013-12-18 LAB — HEMOGLOBIN A1C: Hgb A1c MFr Bld: 6.5 % (ref 4.6–6.5)

## 2013-12-18 NOTE — Telephone Encounter (Signed)
Message copied by [name] on Thu Dec 18, 2013  8:21 AM ------      Message from: Janet Chapman      Created: Tue Dec 09, 2013  4:04 PM      Regarding: Lab orders for Thursday, 8.6.15       Labs for a 6 month f/u ------

## 2013-12-19 ENCOUNTER — Telehealth: Payer: Self-pay | Admitting: Family Medicine

## 2013-12-19 NOTE — Telephone Encounter (Signed)
Relevant patient education assigned to patient using Emmi. ° °

## 2013-12-25 ENCOUNTER — Ambulatory Visit: Payer: Medicare Other | Admitting: Family Medicine

## 2014-01-02 ENCOUNTER — Ambulatory Visit (INDEPENDENT_AMBULATORY_CARE_PROVIDER_SITE_OTHER): Payer: Medicare Other | Admitting: Family Medicine

## 2014-01-02 ENCOUNTER — Encounter: Payer: Self-pay | Admitting: Family Medicine

## 2014-01-02 VITALS — BP 163/69 | HR 51 | Temp 97.9°F | Ht 62.5 in | Wt 157.8 lb

## 2014-01-02 DIAGNOSIS — G8929 Other chronic pain: Secondary | ICD-10-CM

## 2014-01-02 DIAGNOSIS — M5416 Radiculopathy, lumbar region: Principal | ICD-10-CM

## 2014-01-02 DIAGNOSIS — E119 Type 2 diabetes mellitus without complications: Secondary | ICD-10-CM

## 2014-01-02 DIAGNOSIS — E785 Hyperlipidemia, unspecified: Secondary | ICD-10-CM

## 2014-01-02 DIAGNOSIS — I10 Essential (primary) hypertension: Secondary | ICD-10-CM

## 2014-01-02 DIAGNOSIS — IMO0002 Reserved for concepts with insufficient information to code with codable children: Secondary | ICD-10-CM

## 2014-01-02 NOTE — Patient Instructions (Addendum)
Stop at front desk for referral to Dr. Carloyn Manner. Check blood pressure at home off and on. Goal BP < 140/90.  Work on [employer] and regular exercise.

## 2014-01-02 NOTE — Progress Notes (Signed)
Pre visit review using our clinic review tool, if applicable. No additional management support is needed unless otherwise documented below in the visit note. 

## 2014-01-02 NOTE — Assessment & Plan Note (Signed)
Referral to surgeon 

## 2014-01-02 NOTE — Assessment & Plan Note (Signed)
Well controlled on diet. Encouraged exercise, weight loss, healthy eating habits.

## 2014-01-02 NOTE — Assessment & Plan Note (Signed)
Well controlled. Continue current medication.  

## 2014-01-02 NOTE — Assessment & Plan Note (Signed)
Follow BP at home.  Well controlled. Continue current medication.

## 2014-01-02 NOTE — Progress Notes (Signed)
78 year old female presents for 6 month follow up.  She reports she haschronic  low back pain, radiates down left leg. Off and on She reports it has been worse lately. Pain is more severe and occuring more often and taking longer to improve.  She has been seeing chiropractor. This last time it made it worse.  She uses tylenol for relief She would like a referral to Dr. Carloyn Manner, does not want med her or further work up here. Has hx of herniated disc, last imaging years ago, has put off surgery for years.  VQ:MGQQ controlled. Does not check blood sugar at home.  Lab Results  Component Value Date   HGBA1C 6.5 12/18/2013  Working on lifestyle changes  Has been doing very well with diet changes.  Exercise: Stairs, working Geneticist, molecular. She has lost some weight Wt Readings from Last 3 Encounters:  01/02/14 157 lb 12 oz (71.555 kg)  11/04/13 163 lb 12 oz (74.277 kg)  09/26/13 161 lb 12 oz (73.369 kg)   High cholesterol: good control on simvastatin 40 mg daily.  Lab Results  Component Value Date   CHOL 158 12/18/2013   HDL 58.80 12/18/2013   LDLCALC 89 12/18/2013   LDLDIRECT 119.8 04/26/2009   TRIG 49.0 12/18/2013   CHOLHDL 3 12/18/2013  No myalgia.   HTN: Previously well controlled on diltiazem, losartan, HCTZ metoprolol.  She  Got upset about insurance issue prior to BP check today. Saw Dr. Rockey Situ for palpitations in 2012... using metoprolol extra 50 mg rarely because pulse okay. Has not been back to see him.  No CP with exertion. No SOB. No edema.  BP Readings from Last 3 Encounters:  01/02/14 163/69  11/04/13 126/66  09/26/13 120/70  Diet: Fruit and veggies, no FF, some water, artificial sweetener   She does not check at home.  Review of Systems  General: Denies fatigue and fever. No issues with sleep.  CV: Denies chest pain or discomfort and swelling of feet.  Resp: Denies shortness of breath.  GI: Denies abdominal pain.  GU: Denies dysuria.  Physical Exam  General:  Well-developed,well-nourished,in no acute distress; alert,appropriate and cooperative throughout examination  Ears: External ear exam shows no significant lesions or deformities. Otoscopic examination reveals clear canals, tympanic membranes are intact bilaterally without bulging, retraction, inflammation or discharge. Hearing is grossly normal bilaterally.  Nose: External nasal examination shows no deformity or inflammation. Nasal mucosa are pink and moist without lesions or exudates.  Mouth: Oral mucosa and oropharynx without lesions or exudates. Teeth in good repair.  Neck: no carotid bruit or thyromegaly no cervical or supraclavicular lymphadenopathy  Chest Wall: No deformities, masses, or tenderness noted. Lungs: Normal respiratory effort, chest expands symmetrically. Lungs are clear to auscultation, no crackles or wheezes.  Heart: Normal rate and regular rhythm. S1 and S2 normal without gallop, murmur, click, rub or other extra sounds.  Abdomen: Bowel sounds positive,abdomen soft and non-tender without masses, organomegaly or hernias noted. Pulses: R and L posterior tibial pulses are full and equal bilaterally  Extremities: no edema  Neurologic: No cranial nerve deficits noted. Station and gait are normal. Plantar reflexes are down-going bilaterally. DTRs are symmetrical throughout. Sensory, motor and coordinative functions appear intact.  TTP over low back B, neg SLR. Skin: Intact without suspicious lesions or rashes  Psych: Cognition and judgment appear intact. Alert and cooperative with normal attention span and concentration. No apparent delusions, illusions, hallucinations   Diabetic foot exam:  Normal inspection  No skin breakdown  No calluses  Normal DP pulses  Normal sensation to light touch and monofilament  Nails normal

## 2014-01-06 ENCOUNTER — Other Ambulatory Visit: Payer: Self-pay | Admitting: Family Medicine

## 2014-01-06 ENCOUNTER — Telehealth: Payer: Self-pay | Admitting: Family Medicine

## 2014-01-06 DIAGNOSIS — M5416 Radiculopathy, lumbar region: Principal | ICD-10-CM

## 2014-01-06 DIAGNOSIS — G8929 Other chronic pain: Secondary | ICD-10-CM

## 2014-01-06 NOTE — Telephone Encounter (Signed)
Called patient to explain the protocol of seeing Dr Carloyn Manner, conservative treatment must be failed before MRI v=can be approved. Patient wants to have plain films you recommended first, Please put the order for them in Epic and let me know so I can call the patient when order is in.

## 2014-01-08 ENCOUNTER — Telehealth: Payer: Self-pay | Admitting: Family Medicine

## 2014-01-08 ENCOUNTER — Ambulatory Visit (INDEPENDENT_AMBULATORY_CARE_PROVIDER_SITE_OTHER)
Admission: RE | Admit: 2014-01-08 | Discharge: 2014-01-08 | Disposition: A | Discharge status: Home / Self Care | Payer: Medicare Other | Source: Ambulatory Visit | Attending: Family Medicine | Admitting: Family Medicine

## 2014-01-08 DIAGNOSIS — M5416 Radiculopathy, lumbar region: Principal | ICD-10-CM

## 2014-01-08 DIAGNOSIS — G8929 Other chronic pain: Secondary | ICD-10-CM

## 2014-01-08 DIAGNOSIS — IMO0002 Reserved for concepts with insufficient information to code with codable children: Secondary | ICD-10-CM

## 2014-01-08 NOTE — Telephone Encounter (Signed)
Message copied by [name] on Thu Jan 08, 2014  3:17 PM ------      Message from: Carter Kitten      Created: Thu Jan 08, 2014  2:52 PM       Ms. Lavina Hamman notified as instructed by telephone.  She is agreeable to move forward with MRI. ------

## 2014-01-16 ENCOUNTER — Ambulatory Visit
Admission: RE | Admit: 2014-01-16 | Discharge: 2014-01-16 | Disposition: A | Discharge status: Home / Self Care | Payer: Medicare Other | Source: Ambulatory Visit | Attending: Family Medicine | Admitting: Family Medicine

## 2014-01-16 DIAGNOSIS — G8929 Other chronic pain: Secondary | ICD-10-CM

## 2014-01-16 DIAGNOSIS — M5416 Radiculopathy, lumbar region: Principal | ICD-10-CM

## 2014-02-02 ENCOUNTER — Other Ambulatory Visit: Payer: Self-pay | Admitting: *Deleted

## 2014-02-02 MED ORDER — OMEPRAZOLE 20 MG PO CPDR: 20.0000 mg | DELAYED_RELEASE_CAPSULE | Freq: Every day | ORAL | Status: DC

## 2014-04-13 ENCOUNTER — Other Ambulatory Visit: Payer: Self-pay | Admitting: *Deleted

## 2014-04-13 MED ORDER — DILTIAZEM HCL 120 MG PO TABS: 120.0000 mg | ORAL_TABLET | Freq: Every day | ORAL | Status: DC

## 2014-04-13 NOTE — Telephone Encounter (Signed)
Received faxed refill request electronically from pharmacy. See warning between Diltiazem and Simvastatin. Is it okay to refill medication?

## 2014-04-23 ENCOUNTER — Encounter: Payer: Self-pay | Admitting: Internal Medicine

## 2014-04-23 ENCOUNTER — Ambulatory Visit (INDEPENDENT_AMBULATORY_CARE_PROVIDER_SITE_OTHER): Payer: Medicare Other | Admitting: Internal Medicine

## 2014-04-23 VITALS — BP 130/82 | HR 50 | Temp 97.7°F | Wt 160.0 lb

## 2014-04-23 DIAGNOSIS — H1089 Other conjunctivitis: Secondary | ICD-10-CM

## 2014-04-23 DIAGNOSIS — A499 Bacterial infection, unspecified: Secondary | ICD-10-CM

## 2014-04-23 DIAGNOSIS — H109 Unspecified conjunctivitis: Secondary | ICD-10-CM

## 2014-04-23 MED ORDER — POLYMYXIN B-TRIMETHOPRIM 10000-0.1 UNIT/ML-% OP SOLN: 1.0000 [drp] | OPHTHALMIC | Status: DC

## 2014-04-23 NOTE — Progress Notes (Signed)
Pre visit review using our clinic review tool, if applicable. No additional management support is needed unless otherwise documented below in the visit note. 

## 2014-04-23 NOTE — Progress Notes (Signed)
HPI  Pt presents to the clinic today with c/o headache, facial pressure, nasal congestion and a runny nose. She reports this started yesterday. She has noticed some watering and redness of her left eye. She denies fever, chills or body aches. She has tried Copywriter, advertising and Tylenol with some relief. She does have a history of seasonal allergies.  Review of Systems    Past Medical History  Diagnosis Date  . Hyperlipidemia   . Hypertension   . Urinary incontinence   . Allergic rhinitis   . GERD (gastroesophageal reflux disease)   . Osteoarthritis     Family History  Problem Relation Age of Onset  . Heart attack Father 25  . Dementia Mother   . Stroke Mother 52  . Ovarian cancer Sister 63  . Diabetes      Maternal side  . Ovarian cancer      Grandmother  . Ovarian cancer      aunt    History   Social History  . Marital Status: Married    Spouse Name: N/A    Number of Children: 0  . Years of Education: N/A   Occupational History  . 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 Smoking status: Never Smoker   . Smokeless tobacco: Never Used  . Alcohol Use: No  . Drug Use: No  . Sexual Activity: No   Other Topics Concern  . Not on file   Social History Narrative    Regular exercise: yes, Curves 2x a week   Married 48 + years    Diet: fruit and veggies, no FF, some water, artificial sweetener   HCPOA: [name], has living will, full code ( reviewed 2014)             Allergies  Allergen Reactions  . Codeine     REACTION: nausea     Constitutional:  Denies headache, fatigue , fever or abrupt weight changes.  HEENT:  Positive eye pain, eye redness and runny nose. Denies ear pain, ringing in the ears, wax buildup, nasal congestion or runny nose. Respiratory: . Denies cough, difficulty breathing or shortness of breath.  Cardiovascular: Denies chest pain, chest tightness, palpitations or swelling in the  hands or feet.   No other specific complaints in a complete review of systems (except as listed in HPI above).  Objective:  BP 130/82 mmHg  Pulse 50  Temp(Src) 97.7 F (36.5 C) (Oral)  Wt 160 lb (72.576 kg)  SpO2 98%   General: Appears her stated age, well developed, well nourished in NAD. HEENT: Head: normal shape and size, no sinus tenderness noted; Left Eyes: sclera injected, no icterus, conjunctiva erythematous, green mucous noted in the conjunctival sac; Ears: bilateral cerumen impaction; Nose: mucosa pink and moist, septum midline;  Cardiovascular: Normal rate and rhythm. S1,S2 noted.  No murmur, rubs or gallops noted.  Pulmonary/Chest: Normal effort and positive vesicular breath sounds. No respiratory distress. No wheezes, rales or ronchi noted.      Assessment & Plan:   Bacterial Conjunctivitis:  Warm compresses to left eye RX for polytrim every 4 hours to left eye Ibuprofen for inflammation If spreads to right eye, go ahead and treat right eye Wash hands after you touch your face  RTC as needed or if symptoms persist.

## 2014-04-23 NOTE — Patient Instructions (Signed)

## 2014-06-01 ENCOUNTER — Other Ambulatory Visit: Payer: Self-pay | Admitting: *Deleted

## 2014-06-01 MED ORDER — SIMVASTATIN 40 MG PO TABS: 40.0000 mg | ORAL_TABLET | Freq: Every day | ORAL | Status: DC

## 2014-06-24 ENCOUNTER — Other Ambulatory Visit: Payer: Self-pay | Admitting: *Deleted

## 2014-06-24 MED ORDER — METOPROLOL SUCCINATE ER 50 MG PO TB24: 50.0000 mg | ORAL_TABLET | Freq: Every day | ORAL | Status: DC

## 2014-06-24 MED ORDER — LOSARTAN POTASSIUM-HCTZ 100-25 MG PO TABS
1.0000 | ORAL_TABLET | Freq: Every day | ORAL | Status: DC
Start: 2014-06-24 — End: 2015-06-15

## 2014-06-24 MED ORDER — DILTIAZEM HCL 120 MG PO TABS: 120.0000 mg | ORAL_TABLET | Freq: Every day | ORAL | Status: DC

## 2014-07-02 ENCOUNTER — Encounter: Payer: Self-pay | Admitting: Family Medicine

## 2014-07-03 ENCOUNTER — Other Ambulatory Visit (INDEPENDENT_AMBULATORY_CARE_PROVIDER_SITE_OTHER): Payer: Medicare Other

## 2014-07-03 ENCOUNTER — Telehealth: Payer: Self-pay | Admitting: Family Medicine

## 2014-07-03 DIAGNOSIS — E119 Type 2 diabetes mellitus without complications: Secondary | ICD-10-CM

## 2014-07-03 LAB — HEMOGLOBIN A1C: Hgb A1c MFr Bld: 6.4 % (ref 4.6–6.5)

## 2014-07-03 LAB — LIPID PANEL
Cholesterol: 182 mg/dL (ref 0–200)
HDL: 67.8 mg/dL (ref >39.00)
LDL Cholesterol: 100 mg/dL — ABNORMAL HIGH (ref 0–99)
NonHDL: 114.2
Total CHOL/HDL Ratio: 3
Triglycerides: 72 mg/dL (ref 0.0–149.0)
VLDL: 14.4 mg/dL (ref 0.0–40.0)

## 2014-07-03 NOTE — Telephone Encounter (Signed)
-----   Message from Ellamae Sia sent at 06/26/2014  9:58 AM EST ----- Regarding: Lab orders for Friday, 2.19.16 Lab orders for a 6 month f/u

## 2014-07-09 ENCOUNTER — Encounter: Payer: Self-pay | Admitting: Family Medicine

## 2014-07-09 ENCOUNTER — Ambulatory Visit (INDEPENDENT_AMBULATORY_CARE_PROVIDER_SITE_OTHER): Payer: Medicare Other | Admitting: Family Medicine

## 2014-07-09 ENCOUNTER — Ambulatory Visit: Payer: Medicare Other | Admitting: Family Medicine

## 2014-07-09 VITALS — BP 130/70 | HR 52 | Temp 98.2°F | Ht 62.0 in | Wt 163.5 lb

## 2014-07-09 DIAGNOSIS — Z0000 Encounter for general adult medical examination without abnormal findings: Secondary | ICD-10-CM

## 2014-07-09 DIAGNOSIS — I10 Essential (primary) hypertension: Secondary | ICD-10-CM

## 2014-07-09 DIAGNOSIS — E119 Type 2 diabetes mellitus without complications: Secondary | ICD-10-CM

## 2014-07-09 DIAGNOSIS — E7800 Pure hypercholesterolemia, unspecified: Secondary | ICD-10-CM

## 2014-07-09 NOTE — Progress Notes (Signed)
Pre visit review using our clinic review tool, if applicable. No additional management support is needed unless otherwise documented below in the visit note. 

## 2014-07-09 NOTE — Assessment & Plan Note (Signed)
Well controlled. Continue current medication.  

## 2014-07-09 NOTE — Addendum Note (Signed)
Addended by: [name] E on: 07/09/2014 04:28 PM   Modules accepted: SmartSet

## 2014-07-09 NOTE — Assessment & Plan Note (Signed)
Well controlled with diet. 

## 2014-07-09 NOTE — Patient Instructions (Addendum)
Increase exercise as able.  Continue low fat, low cholesterol low carb diet. Expect Dr. Wynetta Emery to call regarding colonoscopy repeat this year.

## 2014-07-09 NOTE — Progress Notes (Signed)
I have personally reviewed the Medicare Annual Wellness questionnaire and have noted  1. The patient's medical and social history  2. Their use of alcohol, tobacco or illicit drugs  3. Their current medications and supplements  4. The patient's functional ability including ADL's, fall risks, home safety risks and hearing or visual  impairment.  5. Diet and physical activities  6. Evidence for depression or mood disorders  The patients weight, height, BMI and visual acuity have been recorded in the chart  I have made referrals, counseling and provided education to the patient based review of the above and I have provided the pt with a written personalized care plan for preventive services.   Pulmonary nodule.. CT reassuring , no further eval needed.  IO:NGEX controlled. Does not check blood sugar at home.  Lab Results  Component Value Date   HGBA1C 6.4 07/03/2014  Has not been doing as well with diet changes.  Exercise: None   High cholesterol: good control on simvastatin 40 mg daily.  Lab Results  Component Value Date   CHOL 182 07/03/2014   HDL 67.80 07/03/2014   LDLCALC 100* 07/03/2014   LDLDIRECT 119.8 04/26/2009   TRIG 72.0 07/03/2014   CHOLHDL 3 07/03/2014    HTN: Well controlled on diltiazem, losartan, HCTZ metoprolol.  Saw Dr. Rockey Situ for palpitations in 2012... using metoprolol extra 50 mg rarely because pulse okay. Has not been back to see him.  No CP with exertion. No SOB. No edema.   BP Readings from Last 3 Encounters:  07/09/14 130/70  04/23/14 130/82  01/02/14 163/69              Diet: Fruit and veggies, no FF, some water, artificial sweetener   Review of Systems  General: Denies fatigue and fever. No issues with sleep. CV: Denies chest pain or discomfort and swelling of feet.  Resp: Denies shortness of breath.  GI: Denies abdominal pain.  GU: Denies dysuria.   Physical Exam  General: Well-developed,well-nourished,in no acute  distress; alert,appropriate and cooperative throughout examination  Ears: External ear exam shows no significant lesions or deformities. Otoscopic examination reveals clear canals, tympanic membranes are intact bilaterally without bulging, retraction, inflammation or discharge. Hearing is grossly normal bilaterally.  Nose: External nasal examination shows no deformity or inflammation. Nasal mucosa are pink and moist without lesions or exudates.  Mouth: Oral mucosa and oropharynx without lesions or exudates. Teeth in good repair.  Neck: no carotid bruit or thyromegaly no cervical or supraclavicular lymphadenopathy  Chest Wall: No deformities, masses, or tenderness noted.  Breasts: No mass, nodules, thickening, tenderness, bulging, retraction, inflamation, nipple discharge or skin changes noted.  Lungs: Normal respiratory effort, chest expands symmetrically. Lungs are clear to auscultation, no crackles or wheezes.  Heart: Normal rate and regular rhythm. S1 and S2 normal without gallop, murmur, click, rub or other extra sounds.  Abdomen: Bowel sounds positive,abdomen soft and non-tender without masses, organomegaly or hernias noted.  Genitalia: not indicated  Pulses: R and L posterior tibial pulses are full and equal bilaterally  Extremities: no edema  Neurologic: No cranial nerve deficits noted. Station and gait are normal. Plantar reflexes are down-going bilaterally. DTRs are symmetrical throughout. Sensory, motor and coordinative functions appear intact.  Skin: Intact without suspicious lesions or rashes  Psych: Cognition and judgment appear intact. Alert and cooperative with normal attention span and concentration. No apparent delusions, illusions, hallucinations   Diabetic foot exam:  Normal inspection  No skin breakdown  No calluses  Normal  DP pulses  Normal sensation to light touch and monofilament  Nails normal   A/P  The patient's preventative maintenance and  recommended screening tests for an annual wellness exam were reviewed in full today.  Brought up to date unless services declined.  Counselled on the importance of diet, exercise, and its role in overall health and mortality.  The patient's FH and SH was reviewed, including their home life, tobacco status, and drug and alcohol status.  Vaccines:uptodate with flu, Td, shingles and pneumovax, prevnar today. Mammo:   3D Had 07/01/14 per pt nml DVE: pap and DVE not indicated. TAH. Sister with ovarian cancer  DEXA:mild osteopenia 07/2012, work on walking, Ca and Vit D Colon: 02/01/2005 Nml, Dr. Wynetta Emery repeat in 10 years Nonsmoker

## 2014-10-16 ENCOUNTER — Encounter: Payer: Self-pay | Admitting: Internal Medicine

## 2014-10-16 ENCOUNTER — Ambulatory Visit (INDEPENDENT_AMBULATORY_CARE_PROVIDER_SITE_OTHER): Payer: Medicare Other | Admitting: Internal Medicine

## 2014-10-16 VITALS — BP 140/80 | HR 55 | Temp 97.9°F | Wt 165.0 lb

## 2014-10-16 DIAGNOSIS — L255 Unspecified contact dermatitis due to plants, except food: Secondary | ICD-10-CM | POA: Diagnosis not present

## 2014-10-16 MED ORDER — TRIAMCINOLONE ACETONIDE 0.1 % EX CREA: 1.0000 "application " | TOPICAL_CREAM | Freq: Two times a day (BID) | CUTANEOUS | Status: DC | PRN

## 2014-10-16 MED ORDER — PREDNISONE 20 MG PO TABS: 40.0000 mg | ORAL_TABLET | Freq: Every day | ORAL | Status: DC

## 2014-10-16 NOTE — Progress Notes (Signed)
Pre visit review using our clinic review tool, if applicable. No additional management support is needed unless otherwise documented below in the visit note. 

## 2014-10-16 NOTE — Progress Notes (Signed)
Subjective:    Patient ID: Janet Chapman, female    DOB: 06-May-1936, 79 y.o.   MRN: [id number]  HPI Here due to rash  Thinks she has poison ivy Had vine that fell off a tree onto her shrubs 8 days ago and she cut it up Did try to wash off 3 days later--started with rash Persistent worsening on arms  No other areas but arms  Has only used calamine and some OTC spray stuff  Current Outpatient Prescriptions on File Prior to Visit  Medication Sig Dispense Refill  . aspirin 81 MG tablet Take 81 mg by mouth daily.      . Calcium Carbonate-Vitamin D (CALCIUM 600+D) 600-200 MG-UNIT TABS Take 1 tablet by mouth daily.      . Coenzyme Q10 150 MG CAPS Take 1 capsule by mouth daily.      Marland Kitchen diltiazem (CARDIZEM) 120 MG tablet Take 1 tablet (120 mg total) by mouth daily. 90 tablet 3  . fish oil-omega-3 fatty acids 1000 MG capsule Take 2 g by mouth daily.      . Glucosamine-Chondroit-Vit C-Mn (GLUCOSAMINE CHONDR 1500 COMPLX PO) Take 1 tablet by mouth daily.    Marland Kitchen glucose blood test strip Use as instructed to check blood sugar once a day     . losartan-hydrochlorothiazide (HYZAAR) 100-25 MG per tablet Take 1 tablet by mouth daily. 90 tablet 3  . LUTEIN PO Take one by mouth daily    . metoprolol succinate (TOPROL-XL) 50 MG 24 hr tablet Take 1 tablet (50 mg total) by mouth daily. 90 tablet 3  . Multiple Vitamin (MULTIVITAMIN) tablet Take 1 tablet by mouth daily.      Marland Kitchen omeprazole (PRILOSEC) 20 MG capsule Take 1 capsule (20 mg total) by mouth daily. 90 capsule 1  . simvastatin (ZOCOR) 40 MG tablet Take 1 tablet (40 mg total) by mouth at bedtime. 90 tablet 1   No current facility-administered medications on file prior to visit.    Allergies  Allergen Reactions  . Codeine     REACTION: nausea    Past Medical History  Diagnosis Date  . Hyperlipidemia   . Hypertension   . Urinary incontinence   . Allergic rhinitis   . GERD (gastroesophageal reflux disease)   . Osteoarthritis     Past  Surgical History  Procedure Laterality Date  . Cardiovascular stress test  2004    [facility]. [facility], cardiolite-normal  . Cholecystectomy  [phone]  . Abdominal hysterectomy  1993    TA    Family History  Problem Relation Age of Onset  . Heart attack Father 97  . Dementia Mother   . Stroke Mother 35  . Ovarian cancer Sister 81  . Diabetes      Maternal side  . Ovarian cancer      Grandmother  . Ovarian cancer      aunt    History   Social History  . Marital Status: Married    Spouse Name: N/A  . Number of Children: 0  . Years of Education: N/A   Occupational History  . 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 Smoking status: Never Smoker   . Smokeless tobacco: Never Used  . Alcohol Use: No  . Drug Use: No  . Sexual Activity: No   Other Topics Concern  . Not on file   Social History Narrative  Regular exercise: yes, Curves 2x a week   Married 48 + years    Diet: fruit and veggies, no FF, some water, artificial sweetener   HCPOA: [name], has living will, full code ( reviewed 2014)            Review of Systems No fever--but feels hot No throat or mouth swelling    Objective:   Physical Exam  Constitutional: She appears well-developed and well-nourished. No distress.  HENT:  Mouth/Throat: Oropharynx is clear and moist. No oropharyngeal exudate.  Skin:  Classic papulovesicular rash mostly on volar forearms Coalesced especially at left elbow No secondary infection          Assessment & Plan:

## 2014-10-16 NOTE — Assessment & Plan Note (Signed)
May be about at the worst Will give topical cortisone Antihistamines Rx for prednisone if worsens

## 2014-10-16 NOTE — Patient Instructions (Signed)
Please try the prescription cortisone cream (can use 3-4 times a day) and try cetirizine 10mg  or fexofenadine 180mg  at bedtime for the itching. If the rash is getting worse in the next couple of days, please start the prednisone (2 tabs daily for the first 5 days, then 1 tab daily for 5 days)

## 2014-10-20 ENCOUNTER — Other Ambulatory Visit: Payer: Self-pay | Admitting: *Deleted

## 2014-10-20 MED ORDER — OMEPRAZOLE 20 MG PO CPDR: 20.0000 mg | DELAYED_RELEASE_CAPSULE | Freq: Every day | ORAL | Status: DC

## 2014-12-01 ENCOUNTER — Other Ambulatory Visit: Payer: Self-pay | Admitting: *Deleted

## 2014-12-01 MED ORDER — SIMVASTATIN 40 MG PO TABS: 40.0000 mg | ORAL_TABLET | Freq: Every day | ORAL | Status: DC

## 2014-12-31 ENCOUNTER — Other Ambulatory Visit (INDEPENDENT_AMBULATORY_CARE_PROVIDER_SITE_OTHER): Payer: Medicare Other

## 2014-12-31 ENCOUNTER — Telehealth: Payer: Self-pay | Admitting: Family Medicine

## 2014-12-31 DIAGNOSIS — E119 Type 2 diabetes mellitus without complications: Secondary | ICD-10-CM

## 2014-12-31 LAB — LIPID PANEL
Cholesterol: 170 mg/dL (ref 0–200)
HDL: 61.1 mg/dL (ref >39.00)
LDL Cholesterol: 97 mg/dL (ref 0–99)
NonHDL: 108.65
Total CHOL/HDL Ratio: 3
Triglycerides: 58 mg/dL (ref 0.0–149.0)
VLDL: 11.6 mg/dL (ref 0.0–40.0)

## 2014-12-31 LAB — COMPREHENSIVE METABOLIC PANEL
ALT: 14 U/L (ref 0–35)
AST: 20 U/L (ref 0–37)
Albumin: 4.5 g/dL (ref 3.5–5.2)
Alkaline Phosphatase: 55 U/L (ref 39–117)
BUN: 24 mg/dL — ABNORMAL HIGH (ref 6–23)
CO2: 36 mEq/L — ABNORMAL HIGH (ref 19–32)
Calcium: 10.2 mg/dL (ref 8.4–10.5)
Chloride: 99 mEq/L (ref 96–112)
Creatinine, Ser: 0.79 mg/dL (ref 0.40–1.20)
GFR: 75 mL/min (ref >60.00)
Glucose, Bld: 113 mg/dL — ABNORMAL HIGH (ref 70–99)
Potassium: 4.1 mEq/L (ref 3.5–5.1)
Sodium: 141 mEq/L (ref 135–145)
Total Bilirubin: 0.6 mg/dL (ref 0.2–1.2)
Total Protein: 7.1 g/dL (ref 6.0–8.3)

## 2014-12-31 LAB — HEMOGLOBIN A1C: Hgb A1c MFr Bld: 6.4 % (ref 4.6–6.5)

## 2014-12-31 NOTE — Telephone Encounter (Signed)
-----   Message from Ellamae Sia sent at 12/24/2014  2:45 PM EDT ----- Regarding: Lab orders for Thursday, 8.18.16 Lab orders for a f/u appt

## 2015-01-07 ENCOUNTER — Encounter: Payer: Self-pay | Admitting: Family Medicine

## 2015-01-07 ENCOUNTER — Ambulatory Visit (INDEPENDENT_AMBULATORY_CARE_PROVIDER_SITE_OTHER): Payer: Medicare Other | Admitting: Family Medicine

## 2015-01-07 VITALS — BP 138/70 | HR 54 | Temp 97.9°F | Ht 66.5 in | Wt 162.5 lb

## 2015-01-07 DIAGNOSIS — G3184 Mild cognitive impairment, so stated: Secondary | ICD-10-CM | POA: Insufficient documentation

## 2015-01-07 DIAGNOSIS — E7800 Pure hypercholesterolemia, unspecified: Secondary | ICD-10-CM

## 2015-01-07 DIAGNOSIS — Z23 Encounter for immunization: Secondary | ICD-10-CM

## 2015-01-07 DIAGNOSIS — I10 Essential (primary) hypertension: Secondary | ICD-10-CM | POA: Diagnosis not present

## 2015-01-07 DIAGNOSIS — F068 Other specified mental disorders due to known physiological condition: Secondary | ICD-10-CM | POA: Insufficient documentation

## 2015-01-07 DIAGNOSIS — E119 Type 2 diabetes mellitus without complications: Secondary | ICD-10-CM | POA: Diagnosis not present

## 2015-01-07 DIAGNOSIS — N951 Menopausal and female climacteric states: Secondary | ICD-10-CM

## 2015-01-07 DIAGNOSIS — R232 Flushing: Secondary | ICD-10-CM | POA: Insufficient documentation

## 2015-01-07 DIAGNOSIS — R413 Other amnesia: Secondary | ICD-10-CM

## 2015-01-07 NOTE — Patient Instructions (Addendum)
Get back to walking as able.  Also schedule appt for  memory loss and mental status exam 30 min OV in next few weeks.

## 2015-01-07 NOTE — Assessment & Plan Note (Signed)
No red flags.  If persisting after summer, consider TSH.

## 2015-01-07 NOTE — Assessment & Plan Note (Signed)
Well controlled with diet. 

## 2015-01-07 NOTE — Assessment & Plan Note (Signed)
Well controlled. Continue current medication.  

## 2015-01-07 NOTE — Progress Notes (Signed)
Pre visit review using our clinic review tool, if applicable. No additional management support is needed unless otherwise documented below in the visit note. 

## 2015-01-07 NOTE — Progress Notes (Signed)
79 year old female preset ns for 6 month follow up.  She has been having another spell of hot flashes, in day. In last 3 months. No fatigue, no night sweats, no unexpected weight loss.  S/P complete hysterectomy. Sister with ovarian cancer.  Her husband is concerned she may have some memory issues. Mother with dementia at 63s. She has not noted issues other than name recall, same as all her friends.  HW:EXHB controlled. Does not check blood sugar at home.  Lab Results  Component Value Date   HGBA1C 6.4 12/31/2014  Feet: no ulcers Has not been doing as well with diet changes.  Exercise: None   High cholesterol: good control on simvastatin 40 mg daily.  LDL at goal < 100. Lab Results  Component Value Date   CHOL 170 12/31/2014   HDL 61.10 12/31/2014   LDLCALC 97 12/31/2014   LDLDIRECT 119.8 04/26/2009   TRIG 58.0 12/31/2014   CHOLHDL 3 12/31/2014   HTN: Well controlled on diltiazem, losartan, HCTZ metoprolol.  BP Readings from Last 3 Encounters:  01/07/15 138/70  10/16/14 140/80  07/09/14 130/70  Saw Dr. Rockey Situ for palpitations in 2012... using metoprolol extra 50 mg rarely because pulse okay. Has not been back to see him.  No CP with exertion. No SOB. No edema.   Wt Readings from Last 3 Encounters:  01/07/15 162 lb 8 oz (73.71 kg)  10/16/14 165 lb (74.844 kg)  07/09/14 163 lb 8 oz (74.163 kg)   Diet: Fruit and veggies, no FF, some water, artificial sweetener  Exercise:None given heat  Review of Systems  General: Denies fatigue and fever. No issues with sleep. CV: Denies chest pain or discomfort and swelling of feet.  Resp: Denies shortness of breath.  GI: Denies abdominal pain.  Urinary incontience GU: Denies dysuria.    Physical Exam  General: Well-developed,well-nourished,in no acute distress; alert,appropriate and cooperative throughout examination  Ears: External ear exam shows no significant lesions or deformities. Otoscopic examination reveals  clear canals, tympanic membranes are intact bilaterally without bulging, retraction, inflammation or discharge. Hearing is grossly normal bilaterally.  Nose: External nasal examination shows no deformity or inflammation. Nasal mucosa are pink and moist without lesions or exudates.  Mouth: Oral mucosa and oropharynx without lesions or exudates. Teeth in good repair.  Neck: no carotid bruit or thyromegaly no cervical or supraclavicular lymphadenopathy  Chest Wall: No deformities, masses, or tenderness noted.  Breasts: No mass, nodules, thickening, tenderness, bulging, retraction, inflamation, nipple discharge or skin changes noted.  Lungs: Normal respiratory effort, chest expands symmetrically. Lungs are clear to auscultation, no crackles or wheezes.  Heart: Normal rate and regular rhythm. S1 and S2 normal without gallop, murmur, click, rub or other extra sounds.  Abdomen: Bowel sounds positive,abdomen soft and non-tender without masses, organomegaly or hernias noted.  Genitalia: not indicated  Pulses: R and L posterior tibial pulses are full and equal bilaterally  Extremities: no edema  Neurologic: No cranial nerve deficits noted. Station and gait are normal. Plantar reflexes are down-going bilaterally. DTRs are symmetrical throughout. Sensory, motor and coordinative functions appear intact.  Skin: Intact without suspicious lesions or rashes  Psych: Cognition and judgment appear intact. Alert and cooperative with normal attention span and concentration. No apparent delusions, illusions, hallucinations   Diabetic foot exam:  Normal inspection  No skin breakdown  No calluses  Normal DP pulses  Normal sensation to light touch and monofilament  Nails normal

## 2015-01-07 NOTE — Assessment & Plan Note (Signed)
Return for full memory eval. Will hold on labs for now as pt feels it is actually her husband with memory issues not her. recommended him coming to appt next visit.

## 2015-01-07 NOTE — Assessment & Plan Note (Signed)
LDL at goal <100.

## 2015-02-04 ENCOUNTER — Ambulatory Visit (INDEPENDENT_AMBULATORY_CARE_PROVIDER_SITE_OTHER): Payer: Medicare Other | Admitting: Family Medicine

## 2015-02-04 VITALS — BP 122/80 | HR 60 | Temp 97.5°F | Wt 161.0 lb

## 2015-02-04 DIAGNOSIS — F068 Other specified mental disorders due to known physiological condition: Secondary | ICD-10-CM | POA: Diagnosis not present

## 2015-02-04 DIAGNOSIS — R413 Other amnesia: Secondary | ICD-10-CM

## 2015-02-04 LAB — TSH: TSH: 1.72 u[IU]/mL (ref 0.35–4.50)

## 2015-02-04 LAB — VITAMIN B12: Vitamin B-12: 347 pg/mL (ref 211–911)

## 2015-02-04 LAB — VITAMIN D 25 HYDROXY (VIT D DEFICIENCY, FRACTURES): VITD: 51.89 ng/mL (ref 30.00–100.00)

## 2015-02-04 MED ORDER — DONEPEZIL HCL 5 MG PO TABS: 5.0000 mg | ORAL_TABLET | Freq: Every day | ORAL | Status: DC

## 2015-02-04 NOTE — Progress Notes (Signed)
Pre visit review using our clinic review tool, if applicable. No additional management support is needed unless otherwise documented below in the visit note. 

## 2015-02-04 NOTE — Progress Notes (Signed)
   Subjective:    Patient ID: Janet Chapman, female    DOB: [birth year], 79 y.o.   MRN: [id number]  HPI   79 year old female with family history in her mother of dementia in 53s presents for memory evaluation.  She reports that her husband has noted memory issue: she cannot remember names as well, or husband say he has told her something and she feels he has not. He is not presents today. Pt does not feel she has a memory issue.   No depression, no increase stress, no anxiety.  Not on an sedating meds. Sleeping well at night, no snoring/apnea.  She has not had B12, TSH, vit D, RPR yet.  MMSE 29/30 7 in 30 sec animal recall, nml clock  Review of Systems  Constitutional: Negative for fever and fatigue.  HENT: Negative for ear pain.   Eyes: Negative for pain.  Respiratory: Negative for chest tightness and shortness of breath.   Cardiovascular: Negative for chest pain, palpitations and leg swelling.  Gastrointestinal: Negative for abdominal pain.  Genitourinary: Negative for dysuria.       Objective:   Physical Exam  Constitutional: Vital signs are normal. She appears well-developed and well-nourished. She is cooperative.  Non-toxic appearance. She does not appear ill. No distress.  HENT:  Head: Normocephalic.  Right Ear: Hearing, tympanic membrane, external ear and ear canal normal. Tympanic membrane is not erythematous, not retracted and not bulging.  Left Ear: Hearing, tympanic membrane, external ear and ear canal normal. Tympanic membrane is not erythematous, not retracted and not bulging.  Nose: No mucosal edema or rhinorrhea. Right sinus exhibits no maxillary sinus tenderness and no frontal sinus tenderness. Left sinus exhibits no maxillary sinus tenderness and no frontal sinus tenderness.  Mouth/Throat: Uvula is midline, oropharynx is clear and moist and mucous membranes are normal.  Eyes: Conjunctivae, EOM and lids are normal. Pupils are equal, round, and reactive to light.  Lids are everted and swept, no foreign bodies found.  Neck: Trachea normal and normal range of motion. Neck supple. Carotid bruit is not present. No thyroid mass and no thyromegaly present.  Cardiovascular: Normal rate, regular rhythm, S1 normal, S2 normal, normal heart sounds, intact distal pulses and normal pulses.  Exam reveals no gallop and no friction rub.   No murmur heard. Pulmonary/Chest: Effort normal and breath sounds normal. No tachypnea. No respiratory distress. She has no decreased breath sounds. She has no wheezes. She has no rhonchi. She has no rales.  Abdominal: Soft. Normal appearance and bowel sounds are normal. There is no tenderness.  Neurological: She is alert.  Skin: Skin is warm, dry and intact. No rash noted.  Psychiatric: Her speech is normal and behavior is normal. Judgment and thought content normal. Her mood appears not anxious. Cognition and memory are normal. She does not exhibit a depressed mood.          Assessment & Plan:  Total visit time 25 minutes, > 50% spent counseling and cordinating patients care.

## 2015-02-04 NOTE — Patient Instructions (Addendum)
Stop at lab on way out. Consider starting donezepil (aricept) for memory. We will re-eval memory in at follow up.

## 2015-02-04 NOTE — Addendum Note (Signed)
Addended by: [name] E on: 02/04/2015 09:53 AM   Modules accepted: Orders

## 2015-02-04 NOTE — Assessment & Plan Note (Signed)
Mild memory issues.. Possible early dementia given family history.  Eval with labs.  No clear secondary cause.  Will start donezepil 5 mg daily, follow up memory in 6 months.

## 2015-02-05 LAB — RPR

## 2015-02-16 ENCOUNTER — Telehealth: Payer: Self-pay | Admitting: Family Medicine

## 2015-02-16 NOTE — Telephone Encounter (Signed)
Notify pt that occasionally aricept may cause anxiety about 1% cases.. Does she feel that  She started symptoms when she started the med?

## 2015-02-16 NOTE — Telephone Encounter (Signed)
Pt has appt 02/18/15 at 9:45 with Dr [name].

## 2015-02-16 NOTE — Telephone Encounter (Signed)
Name: Janet Chapman  DOB: [birth year]    Initial Comment Caller states she has been jittery and feeling nervous. She has type 2 diabetes and cannot test her blood sugar. Her stools has been darker as well. Real dark brown and very smelly. Also has no energy. Has been going on for a month.    Nurse Assessment  Nurse: Luther Parody, RN, Malachy Mood Date/Time (Eastern Time): 02/16/2015 4:18:04 PM  Confirm and document reason for call. If symptomatic, describe symptoms. ---Caller states that she has been having very dark bms that have a strong odor for over a month and she is experiencing intermittent period of jitterness and anxiety. Denies anxiety at this time. She is also concerned that she is unable to check her bs because she has difficulty doing so because it is painful.  Has the patient traveled out of the country within the last 30 days? ---Not Applicable  Does the patient have any new or worsening symptoms? ---Yes  Will a triage be completed? ---Yes  Related visit to physician within the last 2 weeks? ---No  Does the PT have any chronic conditions? (i.e. diabetes, asthma, etc.) ---Yes  List chronic conditions. ---htn, high cholesterol     Guidelines    Guideline Title Affirmed Question Affirmed Notes  Stools - Unusual Color [1] Abnormal color is unexplained AND [2] persists > 24 hours    Final Disposition User   See PCP When Office is Open (within 3 days) Luther Parody, RN, Malachy Mood    Disagree/Comply: Comply

## 2015-02-17 NOTE — Telephone Encounter (Signed)
Spoke with Ms. Pember.  She states this has been going on for a while, even before starting the Aricept.  She states she is nauseated and has had dark stools with an odor.  She states that has gotten a little better since stopping the OTC supplements she had been taking.  She states she has been drinking a lot of Ginger Ale to help with the nausea.  She also complains of havning no energy and at around 11:30 in the morning she starts to fill jittery.  Advised to keep appointment scheduled for tomorrow with Dr. [name] at 9:45 am.

## 2015-02-17 NOTE — Telephone Encounter (Signed)
Left message for Janet Chapman to return my call.

## 2015-02-18 ENCOUNTER — Encounter: Payer: Self-pay | Admitting: Family Medicine

## 2015-02-18 ENCOUNTER — Ambulatory Visit (INDEPENDENT_AMBULATORY_CARE_PROVIDER_SITE_OTHER): Payer: Medicare Other | Admitting: Family Medicine

## 2015-02-18 VITALS — BP 146/62 | HR 52 | Temp 97.8°F | Ht 66.5 in | Wt 163.0 lb

## 2015-02-18 DIAGNOSIS — R194 Change in bowel habit: Secondary | ICD-10-CM | POA: Diagnosis not present

## 2015-02-18 DIAGNOSIS — R450 Nervousness: Secondary | ICD-10-CM

## 2015-02-18 LAB — CBC WITH DIFFERENTIAL/PLATELET
Basophils Absolute: 0 10*3/uL (ref 0.0–0.1)
Basophils Relative: 0.4 % (ref 0.0–3.0)
Eosinophils Absolute: 0.1 10*3/uL (ref 0.0–0.7)
Eosinophils Relative: 1.6 % (ref 0.0–5.0)
HCT: 41.5 % (ref 36.0–46.0)
Hemoglobin: 13.9 g/dL (ref 12.0–15.0)
Lymphocytes Relative: 29.5 % (ref 12.0–46.0)
Lymphs Abs: 2.1 10*3/uL (ref 0.7–4.0)
MCHC: 33.6 g/dL (ref 30.0–36.0)
MCV: 94.6 fl (ref 78.0–100.0)
Monocytes Absolute: 0.7 10*3/uL (ref 0.1–1.0)
Monocytes Relative: 9.9 % (ref 3.0–12.0)
Neutro Abs: 4.2 10*3/uL (ref 1.4–7.7)
Neutrophils Relative %: 58.6 % (ref 43.0–77.0)
Platelets: 224 10*3/uL (ref 150.0–400.0)
RBC: 4.38 Mil/uL (ref 3.87–5.11)
RDW: 13.7 % (ref 11.5–15.5)
WBC: 7.1 10*3/uL (ref 4.0–10.5)

## 2015-02-18 LAB — GLUCOSE, POCT (MANUAL RESULT ENTRY): POC Glucose: 100 mg/dl — AB (ref 70–99)

## 2015-02-18 NOTE — Patient Instructions (Addendum)
Start fiber and/or probiotic. Stop any OTC supplements.  Stop at lab on way out. Try to check CBGs when feeling jittery at home. Have mid morning snack to avoid.  Hold aricept for few days as it may be causing symtpoms.   GETTING TO GOOD BOWEL HEALTH. Irregular bowel habits such as constipation and diarrhea can lead to many problems over time. Having one soft bowel movement a day is the most important way to prevent further problems. The anorectal canal is designed to handle stretching and feces to safely manage our ability to get rid of solid waste (feces, poop, stool) out of our body. BUT, hard constipated stools can act like ripping concrete bricks and diarrhea can be a burning fire to this very sensitive area of our body, causing inflamed hemorrhoids, anal fissures, increasing risk is perirectal abscesses, abdominal pain/bloating, an making irritable bowel worse.  The goal: ONE SOFT BOWEL MOVEMENT A DAY! To have soft, regular bowel movements:   Drink at least 8 tall glasses of water a day.   Take plenty of fiber. Fiber is the undigested part of plant food that passes into the colon, acting s "natures broom" to encourage bowel motility and movement. Fiber can absorb and hold large amounts of water. This results in a larger, bulkier stool, which is soft and easier to pass. Work gradually over several weeks up to 6 servings a day of fiber (25g a day even more if needed) in the form of:  Vegetables -- Root (potatoes, carrots, turnips), leafy green (lettuce, salad greens, celery, spinach), or cooked high residue (cabbage, broccoli, etc)  Fruit -- Fresh (unpeeled skin & pulp), Dried (prunes, apricots, cherries, etc ), or stewed ( applesauce)   Whole grain breads, pasta, etc (whole wheat)   Bran cereals   Bulking Agents -- This type of water-retaining fiber generally is easily obtained each day by one of the following:   Psyllium bran -- The psyllium plant is remarkable  because its ground seeds can retain so much water. This product is available as Metamucil, Konsyl, Effersyllium, Per Diem Fiber, or the less expensive generic preparation in drug and health food stores. Although labeled a laxative, it really is not a laxative.   Methylcellulose -- This is another fiber derived from wood which also retains water. It is available as Citrucel.  Polyethylene Glycol - and "artificial" fiber commonly called Miralax or Glycolax. It is helpful for people with gassy or bloated feelings with regular fiber  Flax Seed - a less gassy fiber than psyllium  No reading or other relaxing activity while on the toilet. If bowel movements take longer than 5 minutes, you are too constipated  AVOID CONSTIPATION. High fiber and water intake usually takes care of this.   Avoid dairy products (especially milk & ice cream) for a short time. The intestines often can lose the ability to digest lactose when stressed.  Avoid foods that cause gassiness or bloating. Typical foods include beans and other legumes, cabbage, broccoli, and dairy foods. Every person has some sensitivity to other foods, so listen to our body and avoid those foods that trigger problems for you.  Adding fiber (Citrucel, Metamucil, psyllium, Miralax) gradually can help thicken stools by absorbing excess fluid and retrain the intestines to act more normally. Slowly increase the dose over a few weeks. Too much fiber too soon can backfire and cause cramping & bloating.  Probiotics (such as active yogurt, Align, etc) may help repopulate the intestines and colon with normal bacteria  and calm down a sensitive digestive tract. Most studies show it to be of mild help, though, and such products can be costly.

## 2015-02-18 NOTE — Progress Notes (Signed)
   Subjective:    Patient ID: Janet Chapman, female    DOB: 30-May-1935, 79 y.o.   MRN: [id number]  HPI   79 year old female with history of DM, HTN. constipaiton presents with new onset nausea in last 4-6  weeks. Tried gingerale with some relief. Now in last few weeks she has noted darker brown, occ smelly stool. No bright red blood. No constipation, no change in diet. 3 BMs today, soft. Occ oozing out when she urinates. Occ rectal spasming.  No abdominal pain.  In last 2-3 weeks.. Started feeling jittery at around 11 AM. Eats breakfast at 7 AM. Not able to check blood sugar.  Eats chocolate and then it goes a way. Increase in fatigue  Lab Results  Component Value Date   HGBA1C 6.4 12/31/2014      Recent start of aricept for mild memory issues, but pt feels she has had issues prior to starting the med. Recent labs 9/22 showed nml TSH, vit D, vit B12, CMET   Review of Systems  Constitutional: Negative for fever and fatigue.  HENT: Negative for ear pain.   Eyes: Negative for pain.  Respiratory: Negative for chest tightness and shortness of breath.   Cardiovascular: Negative for chest pain, palpitations and leg swelling.  Gastrointestinal: Negative for abdominal pain.  Genitourinary: Negative for dysuria.       Objective:   Physical Exam  Constitutional: Vital signs are normal. She appears well-developed and well-nourished. She is cooperative.  Non-toxic appearance. She does not appear ill. No distress.  HENT:  Head: Normocephalic.  Right Ear: Hearing, tympanic membrane, external ear and ear canal normal. Tympanic membrane is not erythematous, not retracted and not bulging.  Left Ear: Hearing, tympanic membrane, external ear and ear canal normal. Tympanic membrane is not erythematous, not retracted and not bulging.  Nose: No mucosal edema or rhinorrhea. Right sinus exhibits no maxillary sinus tenderness and no frontal sinus tenderness. Left sinus exhibits no maxillary  sinus tenderness and no frontal sinus tenderness.  Mouth/Throat: Uvula is midline, oropharynx is clear and moist and mucous membranes are normal.  Eyes: Conjunctivae, EOM and lids are normal. Pupils are equal, round, and reactive to light. Lids are everted and swept, no foreign bodies found.  Neck: Trachea normal and normal range of motion. Neck supple. Carotid bruit is not present. No thyroid mass and no thyromegaly present.  Cardiovascular: Normal rate, regular rhythm, S1 normal, S2 normal, normal heart sounds, intact distal pulses and normal pulses.  Exam reveals no gallop and no friction rub.   No murmur heard. Pulmonary/Chest: Effort normal and breath sounds normal. No tachypnea. No respiratory distress. She has no decreased breath sounds. She has no wheezes. She has no rhonchi. She has no rales.  Abdominal: Soft. Normal appearance and bowel sounds are normal. There is no tenderness.  Genitourinary: Rectum normal. Rectal exam shows no external hemorrhoid, no internal hemorrhoid, no fissure, no mass, no tenderness and anal tone normal. Guaiac negative stool.  No ractal spasm  Neurological: She is alert.  Skin: Skin is warm, dry and intact. No rash noted.  Psychiatric: Her speech is normal and behavior is normal. Judgment and thought content normal. Her mood appears not anxious. Cognition and memory are normal. She does not exhibit a depressed mood.          Assessment & Plan:

## 2015-02-18 NOTE — Progress Notes (Signed)
Pre visit review using our clinic review tool, if applicable. No additional management support is needed unless otherwise documented below in the visit note. 

## 2015-02-18 NOTE — Addendum Note (Signed)
Addended by: Carter Kitten on: 02/18/2015 11:20 AM   Modules accepted: Orders

## 2015-02-18 NOTE — Assessment & Plan Note (Signed)
No clear blood loss in stool. Nml rectal exam and neg hemeoccult. Increase fiber and trial of probiotic. Call if not imrpoving as discussed.

## 2015-02-18 NOTE — Assessment & Plan Note (Signed)
GLucose in nml range now but pt had mint.  Will check cbc. Pt will try to check CBGs when feeling jittery at home. Have mid morning snack to avoid.  Hold aricept for fe days as it may be causing symtpoms.

## 2015-04-27 ENCOUNTER — Ambulatory Visit (INDEPENDENT_AMBULATORY_CARE_PROVIDER_SITE_OTHER): Payer: Medicare Other | Admitting: Family Medicine

## 2015-04-27 ENCOUNTER — Encounter: Payer: Self-pay | Admitting: Family Medicine

## 2015-04-27 VITALS — BP 120/60 | HR 57 | Temp 97.6°F | Ht 66.5 in | Wt 160.8 lb

## 2015-04-27 DIAGNOSIS — N3001 Acute cystitis with hematuria: Secondary | ICD-10-CM | POA: Diagnosis not present

## 2015-04-27 DIAGNOSIS — R300 Dysuria: Secondary | ICD-10-CM

## 2015-04-27 LAB — POCT URINALYSIS DIP (MANUAL ENTRY)
Bilirubin, UA: NEGATIVE
Glucose, UA: NEGATIVE
Ketones, POC UA: NEGATIVE
Nitrite, UA: NEGATIVE
Protein Ur, POC: NEGATIVE
Spec Grav, UA: 1.025
Urobilinogen, UA: 0.2
pH, UA: 6

## 2015-04-27 LAB — POCT UA - MICROSCOPIC ONLY: RBC, urine, microscopic: >10

## 2015-04-27 MED ORDER — SULFAMETHOXAZOLE-TRIMETHOPRIM 800-160 MG PO TABS: 1.0000 | ORAL_TABLET | Freq: Two times a day (BID) | ORAL | Status: DC

## 2015-04-27 NOTE — Progress Notes (Signed)
Pre visit review using our clinic review tool, if applicable. No additional management support is needed unless otherwise documented below in the visit note. 

## 2015-04-27 NOTE — Addendum Note (Signed)
Addended by: Carter Kitten on: 04/27/2015 03:28 PM   Modules accepted: Orders

## 2015-04-27 NOTE — Progress Notes (Signed)
   Subjective:    Patient ID: Janet Chapman, female    DOB: [birth year], 79 y.o.   MRN: [id number]  Dysuria  This is a new problem. The current episode started in the past 7 days (3 days). The problem has been gradually worsening. The quality of the pain is described as burning. The pain is moderate. There has been no fever. She is not sexually active. There is no history of pyelonephritis. Associated symptoms include frequency, hesitancy and urgency. Pertinent negatives include no chills, discharge, flank pain, nausea or vomiting. She has tried increased fluids (cranberry juice) for the symptoms. The treatment provided mild relief. There is no history of catheterization, kidney stones, recurrent UTIs, a single kidney, urinary stasis or a urological procedure.    Social History /Family History/Past Medical History reviewed and updated if needed.   Review of Systems  Constitutional: Negative for chills.  Gastrointestinal: Negative for nausea and vomiting.  Genitourinary: Positive for dysuria, hesitancy, urgency and frequency. Negative for flank pain.       Objective:   Physical Exam  Constitutional: Vital signs are normal. She appears well-developed and well-nourished. She is cooperative.  Non-toxic appearance. She does not appear ill. No distress.  HENT:  Head: Normocephalic.  Right Ear: Hearing, tympanic membrane, external ear and ear canal normal. Tympanic membrane is not erythematous, not retracted and not bulging.  Left Ear: Hearing, tympanic membrane, external ear and ear canal normal. Tympanic membrane is not erythematous, not retracted and not bulging.  Nose: No mucosal edema or rhinorrhea. Right sinus exhibits no maxillary sinus tenderness and no frontal sinus tenderness. Left sinus exhibits no maxillary sinus tenderness and no frontal sinus tenderness.  Mouth/Throat: Uvula is midline, oropharynx is clear and moist and mucous membranes are normal.  Eyes: Conjunctivae, EOM and lids  are normal. Pupils are equal, round, and reactive to light. Lids are everted and swept, no foreign bodies found.  Neck: Trachea normal and normal range of motion. Neck supple. Carotid bruit is not present. No thyroid mass and no thyromegaly present.  Cardiovascular: Normal rate, regular rhythm, S1 normal, S2 normal, normal heart sounds, intact distal pulses and normal pulses.  Exam reveals no gallop and no friction rub.   No murmur heard. Pulmonary/Chest: Effort normal and breath sounds normal. No tachypnea. No respiratory distress. She has no decreased breath sounds. She has no wheezes. She has no rhonchi. She has no rales.  Abdominal: Soft. Normal appearance and bowel sounds are normal. There is no tenderness. There is no CVA tenderness.  Neurological: She is alert.  Skin: Skin is warm, dry and intact. No rash noted.  Psychiatric: Her speech is normal and behavior is normal. Judgment and thought content normal. Her mood appears not anxious. Cognition and memory are normal. She does not exhibit a depressed mood.          Assessment & Plan:

## 2015-04-27 NOTE — Assessment & Plan Note (Signed)
Uncomplicated. Treat with Bactrim x 3 days, push water intake. Call if symptoms not resolved at end of antibiotic course.

## 2015-04-27 NOTE — Patient Instructions (Addendum)
Treat with Bactrim x 3 days, push water intake.  Call if symptoms not resolved at end of antibitoic course. We will call with culture results.

## 2015-04-29 LAB — URINE CULTURE: Colony Count: >=100000

## 2015-06-15 ENCOUNTER — Other Ambulatory Visit: Payer: Self-pay | Admitting: *Deleted

## 2015-06-15 MED ORDER — LOSARTAN POTASSIUM-HCTZ 100-25 MG PO TABS: 1.0000 | ORAL_TABLET | Freq: Every day | ORAL | Status: DC

## 2015-06-23 ENCOUNTER — Other Ambulatory Visit: Payer: Self-pay | Admitting: *Deleted

## 2015-06-23 MED ORDER — METOPROLOL SUCCINATE ER 50 MG PO TB24: 50.0000 mg | ORAL_TABLET | Freq: Every day | ORAL | Status: DC

## 2015-06-28 ENCOUNTER — Other Ambulatory Visit: Payer: Self-pay | Admitting: *Deleted

## 2015-06-28 MED ORDER — DILTIAZEM HCL 120 MG PO TABS: 120.0000 mg | ORAL_TABLET | Freq: Every day | ORAL | Status: DC

## 2015-07-01 ENCOUNTER — Telehealth: Payer: Self-pay | Admitting: Family Medicine

## 2015-07-01 ENCOUNTER — Other Ambulatory Visit (INDEPENDENT_AMBULATORY_CARE_PROVIDER_SITE_OTHER): Payer: Medicare Other

## 2015-07-01 ENCOUNTER — Encounter: Payer: Self-pay | Admitting: Family Medicine

## 2015-07-01 DIAGNOSIS — M8580 Other specified disorders of bone density and structure, unspecified site: Secondary | ICD-10-CM

## 2015-07-01 DIAGNOSIS — E7800 Pure hypercholesterolemia, unspecified: Secondary | ICD-10-CM | POA: Diagnosis not present

## 2015-07-01 DIAGNOSIS — E119 Type 2 diabetes mellitus without complications: Secondary | ICD-10-CM

## 2015-07-01 LAB — VITAMIN D 25 HYDROXY (VIT D DEFICIENCY, FRACTURES): VITD: 45.6 ng/mL (ref 30.00–100.00)

## 2015-07-01 LAB — COMPREHENSIVE METABOLIC PANEL
ALT: 18 U/L (ref 0–35)
AST: 18 U/L (ref 0–37)
Albumin: 4.5 g/dL (ref 3.5–5.2)
Alkaline Phosphatase: 56 U/L (ref 39–117)
BUN: 19 mg/dL (ref 6–23)
CO2: 39 mEq/L — ABNORMAL HIGH (ref 19–32)
Calcium: 10 mg/dL (ref 8.4–10.5)
Chloride: 99 mEq/L (ref 96–112)
Creatinine, Ser: 0.81 mg/dL (ref 0.40–1.20)
GFR: 72.77 mL/min (ref >60.00)
Glucose, Bld: 106 mg/dL — ABNORMAL HIGH (ref 70–99)
Potassium: 3.8 mEq/L (ref 3.5–5.1)
Sodium: 142 mEq/L (ref 135–145)
Total Bilirubin: 0.6 mg/dL (ref 0.2–1.2)
Total Protein: 7 g/dL (ref 6.0–8.3)

## 2015-07-01 LAB — LIPID PANEL
Cholesterol: 172 mg/dL (ref 0–200)
HDL: 70.1 mg/dL (ref >39.00)
LDL Cholesterol: 87 mg/dL (ref 0–99)
NonHDL: 101.99
Total CHOL/HDL Ratio: 2
Triglycerides: 75 mg/dL (ref 0.0–149.0)
VLDL: 15 mg/dL (ref 0.0–40.0)

## 2015-07-01 LAB — HEMOGLOBIN A1C: Hgb A1c MFr Bld: 6.4 % (ref 4.6–6.5)

## 2015-07-01 NOTE — Telephone Encounter (Signed)
-----   Message from Marchia Bond sent at 06/28/2015  4:04 PM EST ----- Regarding: Cpx labs Thurs 2/16, need orders. Thanks! :-) Please order  future cpx labs for pt's upcoming lab appt. Thanks Aniceto Boss

## 2015-07-02 ENCOUNTER — Encounter: Payer: Self-pay | Admitting: Family Medicine

## 2015-07-02 ENCOUNTER — Ambulatory Visit (INDEPENDENT_AMBULATORY_CARE_PROVIDER_SITE_OTHER): Payer: Medicare Other | Admitting: Family Medicine

## 2015-07-02 VITALS — BP 142/69 | HR 56 | Temp 97.5°F | Ht 66.5 in | Wt 154.8 lb

## 2015-07-02 DIAGNOSIS — R300 Dysuria: Secondary | ICD-10-CM

## 2015-07-02 LAB — POC URINALSYSI DIPSTICK (AUTOMATED)
Bilirubin, UA: NEGATIVE
Glucose, UA: NEGATIVE
Ketones, UA: NEGATIVE
Nitrite, UA: NEGATIVE
Protein, UA: NEGATIVE
Spec Grav, UA: 1.02
Urobilinogen, UA: 0.2
pH, UA: 6

## 2015-07-02 NOTE — Addendum Note (Signed)
Addended by: Carter Kitten on: 07/02/2015 02:42 PM   Modules accepted: Orders

## 2015-07-02 NOTE — Assessment & Plan Note (Signed)
UA and micro fairly clear, but given stool leakage and 2 months ago Ecoli in urine. Send for culture.  Avoid bladder irritants and push fluids.

## 2015-07-02 NOTE — Patient Instructions (Signed)
Push fluids. Continue cranberry We will call with urine culture.

## 2015-07-02 NOTE — Progress Notes (Signed)
   Subjective:    Patient ID: Janet Chapman, female    DOB: 12/08/1935, 80 y.o.   MRN: [id number]  Dysuria  This is a new problem. The current episode started in the past 7 days. The problem has been waxing and waning. The quality of the pain is described as burning. The pain is moderate. There has been no fever. She is not sexually active. There is no history of pyelonephritis. Associated symptoms include frequency and urgency. Pertinent negatives include no chills, discharge, flank pain, hematuria, hesitancy, nausea or vomiting. Associated symptoms comments: Low abd pressure . She has tried increased fluids (She has been taking cranberry and phenazopyridine.) for the symptoms. There is no history of catheterization, kidney stones, a single kidney or a urological procedure.   U culture in 04/27/2015: Treated with Bactrim.  She does have stool leakage that may be contributing to UTIs.  Social History /Family History/Past Medical History reviewed and updated if needed.   Review of Systems  Constitutional: Negative for chills.  Gastrointestinal: Negative for nausea and vomiting.  Genitourinary: Positive for dysuria, urgency and frequency. Negative for hesitancy, hematuria and flank pain.       Objective:   Physical Exam  Constitutional: Vital signs are normal. She appears well-developed and well-nourished. She is cooperative.  Non-toxic appearance. She does not appear ill. No distress.  HENT:  Head: Normocephalic.  Right Ear: Hearing, tympanic membrane, external ear and ear canal normal. Tympanic membrane is not erythematous, not retracted and not bulging.  Left Ear: Hearing, tympanic membrane, external ear and ear canal normal. Tympanic membrane is not erythematous, not retracted and not bulging.  Nose: No mucosal edema or rhinorrhea. Right sinus exhibits no maxillary sinus tenderness and no frontal sinus tenderness. Left sinus exhibits no maxillary sinus tenderness and no frontal sinus  tenderness.  Mouth/Throat: Uvula is midline, oropharynx is clear and moist and mucous membranes are normal.  Eyes: Conjunctivae, EOM and lids are normal. Pupils are equal, round, and reactive to light. Lids are everted and swept, no foreign bodies found.  Neck: Trachea normal and normal range of motion. Neck supple. Carotid bruit is not present. No thyroid mass and no thyromegaly present.  Cardiovascular: Normal rate, regular rhythm, S1 normal, S2 normal, normal heart sounds, intact distal pulses and normal pulses.  Exam reveals no gallop and no friction rub.   No murmur heard. Pulmonary/Chest: Effort normal and breath sounds normal. No tachypnea. No respiratory distress. She has no decreased breath sounds. She has no wheezes. She has no rhonchi. She has no rales.  Abdominal: Soft. Normal appearance and bowel sounds are normal. There is no tenderness. There is no CVA tenderness.  Neurological: She is alert.  Skin: Skin is warm, dry and intact. No rash noted.  Psychiatric: Her speech is normal and behavior is normal. Judgment and thought content normal. Her mood appears not anxious. Cognition and memory are normal. She does not exhibit a depressed mood.          Assessment & Plan:

## 2015-07-02 NOTE — Progress Notes (Signed)
Pre visit review using our clinic review tool, if applicable. No additional management support is needed unless otherwise documented below in the visit note. 

## 2015-07-05 LAB — URINE CULTURE: Colony Count: 85000

## 2015-07-08 ENCOUNTER — Encounter: Payer: Self-pay | Admitting: Family Medicine

## 2015-07-08 ENCOUNTER — Ambulatory Visit (INDEPENDENT_AMBULATORY_CARE_PROVIDER_SITE_OTHER): Payer: Medicare Other | Admitting: Family Medicine

## 2015-07-08 VITALS — BP 118/70 | HR 52 | Temp 98.3°F | Ht 62.25 in | Wt 155.5 lb

## 2015-07-08 DIAGNOSIS — Z7189 Other specified counseling: Secondary | ICD-10-CM | POA: Insufficient documentation

## 2015-07-08 DIAGNOSIS — E7800 Pure hypercholesterolemia, unspecified: Secondary | ICD-10-CM

## 2015-07-08 DIAGNOSIS — E119 Type 2 diabetes mellitus without complications: Secondary | ICD-10-CM

## 2015-07-08 DIAGNOSIS — Z0000 Encounter for general adult medical examination without abnormal findings: Secondary | ICD-10-CM

## 2015-07-08 DIAGNOSIS — H6121 Impacted cerumen, right ear: Secondary | ICD-10-CM

## 2015-07-08 DIAGNOSIS — I10 Essential (primary) hypertension: Secondary | ICD-10-CM

## 2015-07-08 DIAGNOSIS — Z1211 Encounter for screening for malignant neoplasm of colon: Secondary | ICD-10-CM

## 2015-07-08 DIAGNOSIS — R413 Other amnesia: Secondary | ICD-10-CM

## 2015-07-08 DIAGNOSIS — H6120 Impacted cerumen, unspecified ear: Secondary | ICD-10-CM | POA: Insufficient documentation

## 2015-07-08 DIAGNOSIS — F068 Other specified mental disorders due to known physiological condition: Secondary | ICD-10-CM

## 2015-07-08 DIAGNOSIS — R300 Dysuria: Secondary | ICD-10-CM

## 2015-07-08 LAB — HM DIABETES FOOT EXAM

## 2015-07-08 NOTE — Assessment & Plan Note (Signed)
Well controlled. Continue current medication.  

## 2015-07-08 NOTE — Progress Notes (Signed)
Pre visit review using our clinic review tool, if applicable. No additional management support is needed unless otherwise documented below in the visit note. 

## 2015-07-08 NOTE — Progress Notes (Signed)
I have personally reviewed the Medicare Annual Wellness questionnaire and have noted 1. The patient's medical and social history 2. Their use of alcohol, tobacco or illicit drugs 3. Their current medications and supplements 4. The patient's functional ability including ADL's, fall risks, home safety risks and hearing or visual             impairment. 5. Diet and physical activities 6. Evidence for depression or mood disorders 7.         Updated provider list Cognitive evaluation was performed and recorded on pt medicare questionnaire form. The patients weight, height, BMI and visual acuity have been recorded in the chart  I have made referrals, counseling and provided education to the patient based review of the above and I have provided the pt with a written personalized care plan for preventive services.   Pulmonary nodule.. CT reassuring , no further eval needed.  Urine culture was negative..  Drinking more water.She is having less dysuria. No fever.  She has been having stool leakage, watery  ongoing x 2 months. Had episode where she lost control of stool. Her diet has changed a lot lately given husband at [facility] [address]. She was staying there. Since she has been home it is getting better.  No blood in stool.  [id number] controlled. Does not check blood sugar at home.  [id number] Lab Results  Component Value Date   HGBA1C 6.4 07/01/2015  Exercise: None   no eye exam in last year.  High cholesterol: good control on simvastatin 40 mg daily.  LDL at goal < 100 Lab Results  Component Value Date   CHOL 172 07/01/2015   HDL 70.10 07/01/2015   LDLCALC 87 07/01/2015   LDLDIRECT 119.8 04/26/2009   TRIG 75.0 07/01/2015   CHOLHDL 2 07/01/2015   HTN: Well controlled on diltiazem, losartan, HCTZ metoprolol.  Saw Dr. Rockey Situ for palpitations in 2012... using metoprolol extra 50 mg rarely because pulse okay. Has not been back to see him.  No CP with exertion. No SOB. No edema.   BP  Readings from Last 3 Encounters:  07/08/15 118/70  07/02/15 142/69  04/27/15 120/60   Diet: Fruit and veggies, no FF, some water, artificial sweetener    Vit D def resolved: Vit D nml.  Wt Readings from Last 3 Encounters:  07/08/15 155 lb 8 oz (70.534 kg)  07/02/15 154 lb 12 oz (70.194 kg)  04/27/15 160 lb 12 oz (72.916 kg)   Mild dementia: Trial of Aricept made her nauseous.   Review of Systems  General: Denies fatigue and fever. No issues with sleep. CV: Denies chest pain or discomfort and swelling of feet.  Resp: Denies shortness of breath.  GI: Denies abdominal pain.  GU: Denies dysuria.   Physical Exam  General: Well-developed,well-nourished,in no acute distress; alert,appropriate and cooperative throughout examination  Ears: External ear exam shows no significant lesions or deformities. Otoscopic examination reveals CERUMEN impaction in right TM, tympanic membranes are intact bilaterally without bulging, retraction, inflammation or discharge. Hearing is grossly normal bilaterally.  Nose: External nasal examination shows no deformity or inflammation. Nasal mucosa are pink and moist without lesions or exudates.  Mouth: Oral mucosa and oropharynx without lesions or exudates. Teeth in good repair.  Neck: no carotid bruit or thyromegaly no cervical or supraclavicular lymphadenopathy  Chest Wall: No deformities, masses, or tenderness noted.  Breasts: No mass, nodules, thickening, tenderness, bulging, retraction, inflamation, nipple discharge or skin changes noted.  Lungs: Normal respiratory effort, chest expands symmetrically.  Lungs are clear to auscultation, no crackles or wheezes.  Heart: Normal rate and regular rhythm. S1 and S2 normal without gallop, murmur, click, rub or other extra sounds.  Abdomen: Bowel sounds positive,abdomen soft and non-tender without masses, organomegaly or hernias noted.  Genitalia: not indicated  Pulses: R and L posterior tibial  pulses are full and equal bilaterally  Extremities: no edema  Neurologic: No cranial nerve deficits noted. Station and gait are normal. Plantar reflexes are down-going bilaterally. DTRs are symmetrical throughout. Sensory, motor and coordinative functions appear intact.  Skin: Intact without suspicious lesions or rashes  Psych: Cognition and judgment appear intact. Alert and cooperative with normal attention span and concentration. No apparent delusions, illusions, hallucinations   Diabetic foot exam:  Normal inspection  No skin breakdown  No calluses  Normal DP pulses  Normal sensation to light touch and monofilament  Nails normal   A/P  The patient's preventative maintenance and recommended screening tests for an annual wellness exam were reviewed in full today.  Brought up to date unless services declined.  Counselled on the importance of diet, exercise, and its role in overall health and mortality.  The patient's FH and SH was reviewed, including their home life, tobacco status, and drug and alcohol status.  Vaccines: Uptodate with flu, Td, shingles and pneumovax, prevnar today. Mammo: 3D Had 06/30/15 per pt nml DVE: pap and DVE not indicated. TAH. Sister with ovarian cancer  DEXA:mild osteopenia 07/2012, repeat 5 year work on walking, Ca and Vit D Colon: 02/01/2005 Nml, Dr. Wynetta Emery repeat in 10 years but pt wishes to do yearly stool test instead. Nonsmoker

## 2015-07-08 NOTE — Patient Instructions (Addendum)
Get back to regular diet, call if stool leakage or diarrhea recurs.  Drink lots of water. Make sure to have yearly eye exam. Stop at lab on way out for picking up stool test.

## 2015-07-08 NOTE — Assessment & Plan Note (Signed)
Living will, HCPOA  In place ( reviewed 2017)

## 2015-07-08 NOTE — Assessment & Plan Note (Signed)
Right ear, irrigated.

## 2015-07-08 NOTE — Assessment & Plan Note (Signed)
Trial of aricept made her nauseous.. Stopped.

## 2015-07-08 NOTE — Assessment & Plan Note (Addendum)
Well controlled on no med. 

## 2015-07-08 NOTE — Assessment & Plan Note (Signed)
Well controlled. Continue current medication. Encouraged exercise, weight loss, healthy eating habits.  

## 2015-07-08 NOTE — Assessment & Plan Note (Signed)
Resolving with fluids.

## 2015-07-20 ENCOUNTER — Other Ambulatory Visit (INDEPENDENT_AMBULATORY_CARE_PROVIDER_SITE_OTHER): Payer: Medicare Other

## 2015-07-20 ENCOUNTER — Encounter: Payer: Self-pay | Admitting: *Deleted

## 2015-07-20 DIAGNOSIS — Z1211 Encounter for screening for malignant neoplasm of colon: Secondary | ICD-10-CM

## 2015-07-20 LAB — FECAL OCCULT BLOOD, IMMUNOCHEMICAL: Fecal Occult Bld: NEGATIVE

## 2015-07-22 ENCOUNTER — Ambulatory Visit (INDEPENDENT_AMBULATORY_CARE_PROVIDER_SITE_OTHER): Payer: Medicare Other | Admitting: Family Medicine

## 2015-07-22 ENCOUNTER — Encounter: Payer: Self-pay | Admitting: Family Medicine

## 2015-07-22 VITALS — BP 120/64 | HR 60 | Temp 97.6°F | Ht 62.25 in | Wt 156.0 lb

## 2015-07-22 DIAGNOSIS — I868 Varicose veins of other specified sites: Secondary | ICD-10-CM | POA: Diagnosis not present

## 2015-07-22 DIAGNOSIS — R300 Dysuria: Secondary | ICD-10-CM

## 2015-07-22 LAB — POC URINALSYSI DIPSTICK (AUTOMATED)
Bilirubin, UA: NEGATIVE
Glucose, UA: NEGATIVE
Ketones, UA: NEGATIVE
Leukocytes, UA: NEGATIVE
Nitrite, UA: NEGATIVE
Protein, UA: NEGATIVE
Spec Grav, UA: 1.025
Urobilinogen, UA: 0.2
pH, UA: 6

## 2015-07-22 MED ORDER — CIPROFLOXACIN HCL 250 MG PO TABS: 250.0000 mg | ORAL_TABLET | Freq: Two times a day (BID) | ORAL | Status: DC

## 2015-07-22 NOTE — Addendum Note (Signed)
Addended by: Carter Kitten on: 07/22/2015 09:03 AM   Modules accepted: Orders

## 2015-07-22 NOTE — Assessment & Plan Note (Addendum)
Concerning for progression of bacterial growth from insignificant counts to higher. Will send for culture and go ahead and start cipro twice daily x 7 days. ( macrobid is not covered on insurance and last ecoli was resitant to sulfa)

## 2015-07-22 NOTE — Patient Instructions (Addendum)
Will send urine for culture.  Start empirically on cipro twice daily.  Push fluids.  Call if fever or symptoms progressing.

## 2015-07-22 NOTE — Progress Notes (Signed)
   Subjective:    Patient ID: Janet Chapman, female    DOB: 09/12/1935, 80 y.o.   MRN: [id number]  HPI   80 year old female with history of HTN and controlled DM,presents with continued low abd  discomfort after  urinating. IN the last  3 weeks this symtpoms has waxed and waned despite her use of cranberry and increasing water. She has avoided bladder irritants.   At last OV on 2/17.. U culture returned with Ecoli but insignificant amounts < 100,000, only resistent to bactrim.Marland Kitchen  She was not treated with an antibitoic.   She denies fever, hematuria, but is occ nauseaous, feel ill overall and has increase in urgency.    She has also noted a red spot on her inner left ankle.. She feels this may me varicose veins but wants to check.  Social History /Family History/Past Medical History reviewed and updated if needed.     Review of Systems  Constitutional: Negative for fever and fatigue.  HENT: Negative for ear pain.   Eyes: Negative for pain.  Respiratory: Negative for chest tightness and shortness of breath.   Cardiovascular: Negative for chest pain, palpitations and leg swelling.  Gastrointestinal: Positive for abdominal pain.  Genitourinary: Negative for dysuria.       Objective:   Physical Exam  Constitutional: Vital signs are normal. She appears well-developed and well-nourished. She is cooperative.  Non-toxic appearance. She does not appear ill. No distress.  HENT:  Head: Normocephalic.  Right Ear: Hearing, tympanic membrane, external ear and ear canal normal. Tympanic membrane is not erythematous, not retracted and not bulging.  Left Ear: Hearing, tympanic membrane, external ear and ear canal normal. Tympanic membrane is not erythematous, not retracted and not bulging.  Nose: No mucosal edema or rhinorrhea. Right sinus exhibits no maxillary sinus tenderness and no frontal sinus tenderness. Left sinus exhibits no maxillary sinus tenderness and no frontal sinus tenderness.    Mouth/Throat: Uvula is midline, oropharynx is clear and moist and mucous membranes are normal.  Eyes: Conjunctivae, EOM and lids are normal. Pupils are equal, round, and reactive to light. Lids are everted and swept, no foreign bodies found.  Neck: Trachea normal and normal range of motion. Neck supple. Carotid bruit is not present. No thyroid mass and no thyromegaly present.  Cardiovascular: Normal rate, regular rhythm, S1 normal, S2 normal, normal heart sounds, intact distal pulses and normal pulses.  Exam reveals no gallop and no friction rub.   No murmur heard. Pulmonary/Chest: Effort normal and breath sounds normal. No tachypnea. No respiratory distress. She has no decreased breath sounds. She has no wheezes. She has no rhonchi. She has no rales.  Abdominal: Soft. Normal appearance and bowel sounds are normal. There is no hepatosplenomegaly. There is tenderness in the right lower quadrant. There is no rigidity, no rebound, no guarding and no CVA tenderness.  Slight tenderness with palpation on right lower abd.  Neurological: She is alert.  Skin: Skin is warm, dry and intact. No rash noted.  Increased density of spider veins on left inner ankle, bilateral varicose veins, nml pulses in ankles  no edema  Psychiatric: Her speech is normal and behavior is normal. Judgment and thought content normal. Her mood appears not anxious. Cognition and memory are normal. She does not exhibit a depressed mood.          Assessment & Plan:

## 2015-07-22 NOTE — Assessment & Plan Note (Signed)
Pt reassured.

## 2015-07-22 NOTE — Progress Notes (Signed)
Pre visit review using our clinic review tool, if applicable. No additional management support is needed unless otherwise documented below in the visit note. 

## 2015-07-23 LAB — URINE CULTURE
Colony Count: NO GROWTH
Organism ID, Bacteria: NO GROWTH

## 2015-07-28 ENCOUNTER — Other Ambulatory Visit: Payer: Self-pay | Admitting: *Deleted

## 2015-07-28 MED ORDER — SIMVASTATIN 40 MG PO TABS: 40.0000 mg | ORAL_TABLET | Freq: Every day | ORAL | Status: DC

## 2015-09-14 ENCOUNTER — Ambulatory Visit: Payer: Medicare Other | Admitting: Family Medicine

## 2015-09-15 ENCOUNTER — Ambulatory Visit: Payer: Medicare Other | Admitting: Family Medicine

## 2015-09-16 ENCOUNTER — Ambulatory Visit: Payer: Medicare Other | Admitting: Family Medicine

## 2015-09-29 ENCOUNTER — Encounter: Payer: Self-pay | Admitting: Family Medicine

## 2015-09-29 ENCOUNTER — Telehealth: Payer: Self-pay | Admitting: Family Medicine

## 2015-09-29 ENCOUNTER — Ambulatory Visit (HOSPITAL_COMMUNITY)
Admission: RE | Admit: 2015-09-29 | Discharge: 2015-09-29 | Disposition: A | Discharge status: Home / Self Care | Payer: Medicare Other | Source: Ambulatory Visit | Attending: Family Medicine | Admitting: Family Medicine

## 2015-09-29 ENCOUNTER — Ambulatory Visit (INDEPENDENT_AMBULATORY_CARE_PROVIDER_SITE_OTHER): Payer: Medicare Other | Admitting: Family Medicine

## 2015-09-29 VITALS — BP 122/64 | HR 52 | Temp 98.1°F | Wt 153.5 lb

## 2015-09-29 DIAGNOSIS — M79631 Pain in right forearm: Secondary | ICD-10-CM | POA: Insufficient documentation

## 2015-09-29 DIAGNOSIS — M7989 Other specified soft tissue disorders: Secondary | ICD-10-CM

## 2015-09-29 DIAGNOSIS — R2231 Localized swelling, mass and lump, right upper limb: Secondary | ICD-10-CM | POA: Insufficient documentation

## 2015-09-29 NOTE — Progress Notes (Signed)
BP 122/64 mmHg  Pulse 52  Temp(Src) 98.1 F (36.7 C) (Oral)  Wt 153 lb 8 oz (69.627 kg)   CC: R arm pain/swelling  Subjective:    Patient ID: Janet Chapman, female    DOB: [birth year], 80 y.o.   MRN: [id number]  HPI: Janet Chapman is a 80 y.o. female presenting on 09/29/2015 for Arm Pain   4d ago started noticing soreness to R arm, then yesterday noticed swelling and hardening of R proximal lateral forearm. Feels pulling sensation down arm when she moves her wrist. Self treated with warm compress and tylenol. No shoulder pain, fevers, redness or warmth of arm.   Denies inciting trauma/injury or falls.  Denies personal or fmhx blood clots. Not on hormone therapy. No prolonged travel.  No recent AC fossa.   Relevant past medical, surgical, family and social history reviewed and updated as indicated. Interim medical history since our last visit reviewed. Allergies and medications reviewed and updated. Current Outpatient Prescriptions on File Prior to Visit  Medication Sig  . aspirin 81 MG tablet Take 81 mg by mouth daily.    . Calcium Carbonate-Vitamin D (CALCIUM 600+D) 600-200 MG-UNIT TABS Take 1 tablet by mouth daily.    . Coenzyme Q10 150 MG CAPS Take 1 capsule by mouth daily.    Marland Kitchen CRANBERRY PO Take by mouth.  . diltiazem (CARDIZEM) 120 MG tablet Take 1 tablet (120 mg total) by mouth daily.  . fish oil-omega-3 fatty acids 1000 MG capsule Take 2 g by mouth daily.    . Glucosamine-Chondroit-Vit C-Mn (GLUCOSAMINE CHONDR 1500 COMPLX PO) Take 1 tablet by mouth daily.  Marland Kitchen glucose blood test strip Use as instructed to check blood sugar once a day   . losartan-hydrochlorothiazide (HYZAAR) 100-25 MG tablet Take 1 tablet by mouth daily.  . LUTEIN PO Take one by mouth daily  . metoprolol succinate (TOPROL-XL) 50 MG 24 hr tablet Take 1 tablet (50 mg total) by mouth daily.  . Multiple Vitamin (MULTIVITAMIN) tablet Take 1 tablet by mouth daily.    . simvastatin (ZOCOR) 40 MG tablet Take 1  tablet (40 mg total) by mouth at bedtime.   No current facility-administered medications on file prior to visit.    Review of Systems Per HPI unless specifically indicated in ROS section     Objective:    BP 122/64 mmHg  Pulse 52  Temp(Src) 98.1 F (36.7 C) (Oral)  Wt 153 lb 8 oz (69.627 kg)  Wt Readings from Last 3 Encounters:  09/29/15 153 lb 8 oz (69.627 kg)  07/22/15 156 lb (70.761 kg)  07/08/15 155 lb 8 oz (70.534 kg)    Physical Exam  Constitutional: She appears well-developed and well-nourished. No distress.  HENT:  Mouth/Throat: Oropharynx is clear and moist. No oropharyngeal exudate.  Cardiovascular: Normal rate, regular rhythm, normal heart sounds and intact distal pulses.   No murmur heard. Pulmonary/Chest: Effort normal and breath sounds normal. No respiratory distress. She has no wheezes. She has no rales.  Musculoskeletal: Normal range of motion.  Evident soft tissue swelling proximal lateral forearm with mild edema distal forearm FROM at bilateral elbows and wrists No significant pain with testing wrist flexors/extensors against resistance 2+ rad pulses bilaterally  Skin: Skin is warm and dry. No rash noted. No erythema.  Psychiatric: She has a normal mood and affect.  Nursing note and vitals reviewed.     Assessment & Plan:   Problem List Items Addressed This Visit    Pain  and swelling of right forearm - Primary    Unclear etiology. Not consistent with tendonitis or cellulitis. ?soft tissue mass although strange for sudden onset over last few days. Check stat RUE ultrasound to r/o DVT. Pt agrees with plan.      Relevant Orders   US Venous Img Upper Uni Right       Follow up plan: Return if symptoms worsen or fail to improve.  Ria Bush, MD

## 2015-09-29 NOTE — Progress Notes (Signed)
*  Preliminary Results* Right upper extremity venous duplex completed. Right upper extremity is negative for deep and superficial vein thrombosis.  Incidental finding: There is a heterogenous area of the left medial forearm in the area of concern that measures 4.5cm wide. This area exhibits low-resistant arterial flow. Etiology is unknown.  Preliminary results discussed with Dr. Lorelei Pont.  09/29/2015 4:30 PM  Maudry Mayhew, RVT, RDCS, RDMS

## 2015-09-29 NOTE — Progress Notes (Signed)
Pre visit review using our clinic review tool, if applicable. No additional management support is needed unless otherwise documented below in the visit note. 

## 2015-09-29 NOTE — Telephone Encounter (Signed)
I received a call regarding the below message. No DVT, but a mass is seen in the R forearm - official reading is pending.   Patient informed that Dr. Darnell Level will be back tomorrow. Likely additional evaluation and imaging will be needed to further characterize, which I would suggest.   Electronically Signed  By: Owens Loffler, MD On: 09/29/2015 5:06 PM     Right upper extremity venous duplex completed. Right upper extremity is negative for deep and superficial vein thrombosis.  Incidental finding: There is a heterogenous area of the left medial forearm in the area of concern that measures 4.5cm wide. This area exhibits low-resistant arterial flow. Etiology is unknown.  Preliminary results discussed with Dr. Lorelei Pont.  09/29/2015 4:30 PM  Maudry Mayhew, RVT, RDCS, RDMS

## 2015-09-29 NOTE — Patient Instructions (Addendum)
Pass by Rosaria Ferries or Allison's office to schedule ultrasound of R upper extremity today. We will call you with results and plan.

## 2015-09-29 NOTE — Assessment & Plan Note (Signed)
Unclear etiology. Not consistent with tendonitis or cellulitis. ?soft tissue mass although strange for sudden onset over last few days. Check stat RUE ultrasound to r/o DVT. Pt agrees with plan.

## 2015-09-29 NOTE — Addendum Note (Signed)
Addended by: Royann Shivers A on: 09/29/2015 12:04 PM   Modules accepted: Orders

## 2015-09-30 NOTE — Telephone Encounter (Signed)
Thank you. Agree. I have ordered MR right extremity to further evaluate.  Strange it suddenly developed over 4 days.

## 2015-09-30 NOTE — Addendum Note (Signed)
Addended by: Ria Bush on: 09/30/2015 08:51 AM   Modules accepted: Orders

## 2015-10-01 ENCOUNTER — Ambulatory Visit (INDEPENDENT_AMBULATORY_CARE_PROVIDER_SITE_OTHER): Payer: Medicare Other | Admitting: Physician Assistant

## 2015-10-01 ENCOUNTER — Ambulatory Visit (INDEPENDENT_AMBULATORY_CARE_PROVIDER_SITE_OTHER): Payer: Medicare Other

## 2015-10-01 ENCOUNTER — Telehealth: Payer: Self-pay | Admitting: Family Medicine

## 2015-10-01 VITALS — BP 112/68 | HR 64 | Temp 97.8°F | Resp 18 | Ht 62.25 in | Wt 154.0 lb

## 2015-10-01 DIAGNOSIS — M79631 Pain in right forearm: Secondary | ICD-10-CM | POA: Diagnosis not present

## 2015-10-01 DIAGNOSIS — M7989 Other specified soft tissue disorders: Secondary | ICD-10-CM

## 2015-10-01 DIAGNOSIS — M799 Soft tissue disorder, unspecified: Secondary | ICD-10-CM

## 2015-10-01 NOTE — Telephone Encounter (Signed)
[facility]  Patient Name: Janet Chapman  DOB: 10/16/1935    Initial Comment Caller states she had venous doppler done for arm swelling, arm swelling is going up her arm with pain.   Nurse Assessment  Nurse: Genoveva Ill, RN, Lattie Haw Date/Time (Eastern Time): 10/01/2015 2:14:13 PM  Confirm and document reason for call. If symptomatic, describe symptoms. You must click the next button to save text entered. ---Caller states she had venous doppler US done for arm swelling from wrist to elbow on Wednesday and swelling is going up her rt arm now to bicep with pain more so with moving; has MRI scheduled for Tuesday  Has the patient traveled out of the country within the last 30 days? ---Not Applicable  Does the patient have any new or worsening symptoms? ---Yes  Will a triage be completed? ---Yes  Related visit to physician within the last 2 weeks? ---Yes  Does the PT have any chronic conditions? (i.e. diabetes, asthma, etc.) ---Yes  List chronic conditions. ---HTN-treated, type 2 diabetes  Is this a behavioral health or substance abuse call? ---No     Guidelines    Guideline Title Affirmed Question Affirmed Notes  Arm Pain Entire arm is swollen    Final Disposition User   See Physician within 4 Hours (or PCP triage) [place], RN, Lattie Haw    Comments  no appts at office or at other [facility] location and referred to [facility]; caller states she will be going   Referrals  [facility]   Disagree/Comply: Comply

## 2015-10-01 NOTE — Progress Notes (Signed)
Patient ID: Janet Chapman, female     DOB: [birth year], 80 y.o.    MRN: [id number]  PCP: [name], MD  Chief Complaint  Patient presents with  . Arm Pain    Right arm swelling and pain    Subjective:    HPI  Presents for evaluation of a tender lump of the RIGHT arm.  Awoke Sunday (5/14) morning and felt soreness in the RIGHT forearm. Persisted and then noted the swelling on Tuesday (5/15) morning. Couldn't get in with her PCP, so saw one of her colleagues. Was send on Wednesday (5/16) for doppler, and was NEGATIVE for DVT. Now she is to have MRI, but appointment isn't until next week. Thinks that the swelling is getting worse, getting hard, and now involes the upper arm. She was advised to come here for evaluation.  Prior to Admission medications   Medication Sig Start Date End Date Taking? Authorizing Provider  aspirin 81 MG tablet Take 81 mg by mouth daily.     Yes Historical Provider, MD  Calcium Carbonate-Vitamin D (CALCIUM 600+D) 600-200 MG-UNIT TABS Take 1 tablet by mouth daily.     Yes Historical Provider, MD  Coenzyme Q10 150 MG CAPS Take 1 capsule by mouth daily.     Yes Historical Provider, MD  CRANBERRY PO Take by mouth.   Yes Historical Provider, MD  diltiazem (CARDIZEM) 120 MG tablet Take 1 tablet (120 mg total) by mouth daily. 06/28/15 06/27/16 Yes Amy Cletis Athens, MD  fish oil-omega-3 fatty acids 1000 MG capsule Take 2 g by mouth daily.     Yes Historical Provider, MD  Glucosamine-Chondroit-Vit C-Mn (GLUCOSAMINE CHONDR 1500 COMPLX PO) Take 1 tablet by mouth daily.   Yes Historical Provider, MD  glucose blood test strip Use as instructed to check blood sugar once a day    Yes Historical Provider, MD  losartan-hydrochlorothiazide (HYZAAR) 100-25 MG tablet Take 1 tablet by mouth daily. 06/15/15  Yes Amy Cletis Athens, MD  LUTEIN PO Take one by mouth daily   Yes Historical Provider, MD  metoprolol succinate (TOPROL-XL) 50 MG 24 hr tablet Take 1 tablet (50 mg total) by  mouth daily. 06/23/15  Yes Amy Cletis Athens, MD  Multiple Vitamin (MULTIVITAMIN) tablet Take 1 tablet by mouth daily.     Yes Historical Provider, MD  simvastatin (ZOCOR) 40 MG tablet Take 1 tablet (40 mg total) by mouth at bedtime. 07/28/15  Yes Amy Cletis Athens, MD     Allergies  Allergen Reactions  . Codeine     REACTION: nausea     Patient Active Problem List   Diagnosis Date Noted  . Pain and swelling of right forearm 09/29/2015  . Spider varicose veins 07/22/2015  . Advanced care planning/counseling discussion 07/08/2015  . Cerumen impaction 07/08/2015  . Dysuria 07/02/2015  . Change in bowel habits 02/18/2015  . 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 Contact dermatitis due to plant 10/16/2014  . Chronic radicular low back pain 01/02/2014  . Atrophic vaginitis 09/26/2013  . Arthralgia 06/06/2013  . Pulmonary nodule, right 05/23/2013  . De Quervain's tenosynovitis, left 04/20/2013  . Osteopenia 07/26/2012  . INSOMNIA, CHRONIC 01/14/2010  . Diabetes mellitus, controlled ([place]) 11/11/2008  . MAMMOGRAM, ABNORMAL, LEFT 11/09/2008  . Chronic idiopathic constipation 09/11/2007  . OBESITY 05/31/2007  . ALLERGIC RHINITIS 05/31/2007  . GERD 05/31/2007  . OSTEOARTHRITIS 05/31/2007  . URINARY INCONTINENCE 05/31/2007  . High cholesterol 04/19/2007  . Essential hypertension, benign 04/19/2007  .  ACTINIC KERATOSIS 02/01/2007     Family History  Problem Relation Age of Onset  . Heart attack Father 56  . Dementia Mother   . Stroke Mother 90  . Ovarian cancer Sister 70  . Diabetes      Maternal side  . Ovarian cancer      Grandmother  . Ovarian cancer      aunt     Social History   Social History  . Marital Status: Married    Spouse Name: N/A  . Number of Children: 0  . Years of Education: N/A   Occupational History  . 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 Smoking status: Never Smoker    . Smokeless tobacco: Never Used  . Alcohol Use: No  . Drug Use: No  . Sexual Activity: No   Other Topics Concern  . Not on file   Social History Narrative    Regular exercise: yes, [employer] 2x a week   Married 48 + years    Diet: fruit and veggies, no FF, some water, artificial sweetener   HCPOA: [name], has living will, full code ( reviewed 2014)                 Review of Systems As above.      Objective:  Physical Exam  Constitutional: She is oriented to person, place, and time. She appears well-developed and well-nourished. She is active and cooperative. No distress.  BP 112/68 mmHg  Pulse 64  Temp(Src) 97.8 F (36.6 C) (Oral)  Resp 18  Ht 5' 2.25" (1.581 m)  Wt 154 lb (69.854 kg)  BMI 27.95 kg/m2  SpO2 98%   Eyes: Conjunctivae are normal.  Pulmonary/Chest: Effort normal.  Musculoskeletal:       Arms: Firm lump of the RIGHT forearm, just distal to the elbow. Minimally tender, described as "sore." Increased "soreness" with full flexion of the wrist. Elbow with FROM.  Neurological: She is alert and oriented to person, place, and time.  Skin: Skin is warm, dry and intact.  Psychiatric: She has a normal mood and affect. Her speech is normal and behavior is normal.        Dg Forearm Right  10/01/2015  CLINICAL DATA:  Sudden development of swelling, lump and tenderness at RIGHT forearm just distal to elbow, negative venous Doppler EXAM: RIGHT FOREARM - 2 VIEW COMPARISON:  None FINDINGS: Osseous demineralization. Joint spaces preserved. No acute fracture, dislocation, or bone destruction. No elbow joint effusion. IMPRESSION: No acute osseous abnormalities. Electronically Signed   By: Lavonia Dana M.D.   On: 10/01/2015 18:17        Assessment & Plan:  1. Soft tissue mass 2. Pain and swelling of right forearm Unclear etiology. Proceed with MRI next week as scheduled. She may use OTC acetaminophen as needed for pain. Return tomorrow if the pain  worsens. - DG Forearm Right; Future    Fara Chute, PA-C Physician Assistant-Certified [facility]

## 2015-10-01 NOTE — Telephone Encounter (Signed)
Per chart review tab pt is at [facility].

## 2015-10-01 NOTE — Patient Instructions (Addendum)
Please rest the arm this weekend. Apply a warm compress to the area, and you may take acetaminophen as needed for increased pain.    IF you received an x-ray today, you will receive an invoice from [facility]. Please contact [facility] at [phone] with questions or concerns regarding your invoice.   IF you received labwork today, you will receive an invoice from [employer]. Please contact [facility] at [phone] with questions or concerns regarding your invoice.   Our billing staff will not be able to assist you with questions regarding bills from these companies.  You will be contacted with the lab results as soon as they are available. The fastest way to get your results is to activate your My Chart account. Instructions are located on the last page of this paperwork. If you have not heard from [place] regarding the results in 2 weeks, please contact this office.

## 2015-10-04 ENCOUNTER — Ambulatory Visit
Admission: RE | Admit: 2015-10-04 | Discharge: 2015-10-04 | Disposition: A | Discharge status: Home / Self Care | Payer: Medicare Other | Source: Ambulatory Visit | Attending: Family Medicine | Admitting: Family Medicine

## 2015-10-04 DIAGNOSIS — M7989 Other specified soft tissue disorders: Secondary | ICD-10-CM

## 2015-10-04 DIAGNOSIS — M79631 Pain in right forearm: Secondary | ICD-10-CM

## 2015-10-04 MED ORDER — GADOBENATE DIMEGLUMINE 529 MG/ML IV SOLN
14.0000 mL | Freq: Once | INTRAVENOUS | Status: AC | PRN
  Administered 2015-10-04: 14 mL via INTRAVENOUS

## 2015-10-05 ENCOUNTER — Inpatient Hospital Stay: Admission: RE | Admit: 2015-10-05 | Payer: Medicare Other | Source: Ambulatory Visit

## 2015-10-05 ENCOUNTER — Other Ambulatory Visit: Payer: Self-pay | Admitting: Family Medicine

## 2015-10-05 DIAGNOSIS — R936 Abnormal findings on diagnostic imaging of limbs: Secondary | ICD-10-CM

## 2015-10-05 DIAGNOSIS — R2231 Localized swelling, mass and lump, right upper limb: Secondary | ICD-10-CM

## 2015-10-06 ENCOUNTER — Other Ambulatory Visit: Payer: Medicare Other

## 2015-10-08 ENCOUNTER — Ambulatory Visit (INDEPENDENT_AMBULATORY_CARE_PROVIDER_SITE_OTHER): Payer: Medicare Other | Admitting: Family Medicine

## 2015-10-08 ENCOUNTER — Encounter: Payer: Self-pay | Admitting: Family Medicine

## 2015-10-08 VITALS — BP 114/70 | HR 54 | Temp 97.8°F | Wt 155.2 lb

## 2015-10-08 DIAGNOSIS — R197 Diarrhea, unspecified: Secondary | ICD-10-CM | POA: Diagnosis not present

## 2015-10-08 NOTE — Patient Instructions (Addendum)
Make sure to get fiber in diet. Increase water.  Start probiotic either align or culturelle or generic.  Follow up in 2 weeks.

## 2015-10-08 NOTE — Progress Notes (Signed)
   Subjective:    Patient ID: Janet Chapman, female    DOB: [birth year], 80 y.o.   MRN: [id number]  HPI   80 year old female with history of chronic idiopathic constipation presents with gas pains and diarrhea , new onset x 5 months. Her husband was in rehab earlier in the year.. She had gotten constipated.. using prune juice, started with UTI symptoms, using cranberry. She had stool incontinence and gas off and on for a week at a time. Seem to get better when she got home from staying with husband.  Then returned off and on.  Has several days at a time with loose stool, dark almost black, strong odor. Then goes back to solid and normal color. Feels bloated.  No emesis, occ mild nausea.  No blood in stool. No fever. No new meds. Not using any constipation meds or magnesium. Did have antibiotic in 07/2014 but was already having symptoms.  No sick contacts.  Last colonoscopy Leodis Binet 02/01/2005 nml except , left diverticulosis. Neg ifob in 07/2015 for colon cancer screening.  [facility] in [facility].  Social History /Family History/Past Medical History reviewed and updated if needed.     Review of Systems  Constitutional: Negative for fever and fatigue.  HENT: Negative for ear pain.   Eyes: Negative for pain.  Respiratory: Negative for chest tightness and shortness of breath.   Cardiovascular: Negative for chest pain, palpitations and leg swelling.  Gastrointestinal: Positive for nausea and diarrhea. Negative for abdominal pain.  Genitourinary: Negative for dysuria.       Objective:   Physical Exam  Constitutional: Vital signs are normal. She appears well-developed and well-nourished. She is cooperative.  Non-toxic appearance. She does not appear ill. No distress.  HENT:  Head: Normocephalic.  Right Ear: Hearing, tympanic membrane, external ear and ear canal normal. Tympanic membrane is not erythematous, not retracted and not bulging.    Left Ear: Hearing, tympanic membrane, external ear and ear canal normal. Tympanic membrane is not erythematous, not retracted and not bulging.  Nose: No mucosal edema or rhinorrhea. Right sinus exhibits no maxillary sinus tenderness and no frontal sinus tenderness. Left sinus exhibits no maxillary sinus tenderness and no frontal sinus tenderness.  Mouth/Throat: Uvula is midline, oropharynx is clear and moist and mucous membranes are normal.  Eyes: Conjunctivae, EOM and lids are normal. Pupils are equal, round, and reactive to light. Lids are everted and swept, no foreign bodies found.  Neck: Trachea normal and normal range of motion. Neck supple. Carotid bruit is not present. No thyroid mass and no thyromegaly present.  Cardiovascular: Normal rate, regular rhythm, S1 normal, S2 normal, normal heart sounds, intact distal pulses and normal pulses.  Exam reveals no gallop and no friction rub.   No murmur heard. Pulmonary/Chest: Effort normal and breath sounds normal. No tachypnea. No respiratory distress. She has no decreased breath sounds. She has no wheezes. She has no rhonchi. She has no rales.  Abdominal: Soft. Normal appearance and bowel sounds are normal. There is no tenderness.  Musculoskeletal:  Right forearm proximal firm mass  Neurological: She is alert.  Skin: Skin is warm, dry and intact. No rash noted.  Psychiatric: Her speech is normal and behavior is normal. Judgment and thought content normal. Her mood appears not anxious. Cognition and memory are normal. She does not exhibit a depressed mood.          Assessment & Plan:

## 2015-10-08 NOTE — Progress Notes (Signed)
Pre visit review using our clinic review tool, if applicable. No additional management support is needed unless otherwise documented below in the visit note. 

## 2015-10-08 NOTE — Assessment & Plan Note (Signed)
No clear infection. Most likely functional . Start probiotic and fiber and water. Follow up in 2 weeks.  If not improving consider TSH, CMET, cbc and eval for cdiff given she spent time in rehab prior to this with her husband.  if all negative.. She needs colonoscopy.

## 2015-10-29 ENCOUNTER — Encounter: Payer: Self-pay | Admitting: Family Medicine

## 2015-10-29 ENCOUNTER — Ambulatory Visit (INDEPENDENT_AMBULATORY_CARE_PROVIDER_SITE_OTHER): Payer: Medicare Other | Admitting: Family Medicine

## 2015-10-29 VITALS — BP 124/80 | HR 59 | Temp 98.3°F | Ht 62.25 in | Wt 156.4 lb

## 2015-10-29 DIAGNOSIS — H109 Unspecified conjunctivitis: Secondary | ICD-10-CM

## 2015-10-29 DIAGNOSIS — J309 Allergic rhinitis, unspecified: Secondary | ICD-10-CM | POA: Diagnosis not present

## 2015-10-29 MED ORDER — ERYTHROMYCIN 5 MG/GM OP OINT: 1.0000 "application " | TOPICAL_OINTMENT | Freq: Every day | OPHTHALMIC | Status: DC

## 2015-10-29 NOTE — Progress Notes (Signed)
Pre visit review using our clinic review tool, if applicable. No additional management support is needed unless otherwise documented below in the visit note. 

## 2015-10-29 NOTE — Patient Instructions (Signed)
INSTRUCTIONS FOR UPPER RESPIRATORY INFECTION:  -compresses for eye a few times per day  -flush eyes a few times per day with artificial tears  -if worsening eye symptoms after a few days or pus from eye or not improving use the ointment we prescribed per instructions.   -nasal saline wash 2-3 times daily (use prepackaged nasal saline or bottled/distilled water if making your own)   -can use AFRIN nasal spray for drainage and nasal congestion - but do NOT use longer then 3-4 days  -can use tylenol (in no history of liver disease) or ibuprofen (if no history of kidney disease, bowel bleeding or significant heart disease) as directed for aches and sorethroat  -in the winter time, using a humidifier at night is helpful (please follow cleaning instructions)  -if you are taking a cough medication - use only as directed, may also try a teaspoon of honey to coat the throat and throat lozenges. If given a cough medication with codeine or hydrocodone or other narcotic please be advised that this contains a strong and  potentially addicting medication. Please follow instructions carefully, take as little as possible and only use AS NEEDED for severe cough. Discuss potential side effects with your pharmacy. Please do not drive or operate machinery while taking these types of medications. Please do not take other sedating medications, drugs or alcohol while taking this medication without discussing with your doctor.  -for sore throat, salt water gargles can help  -follow up if you have fevers, facial pain, tooth pain, difficulty breathing or are worsening or symptoms persist longer then expected  Upper Respiratory Infection, Adult An upper respiratory infection (URI) is also known as the common cold. It is often caused by a type of germ (virus). Colds are easily spread (contagious). You can pass it to others by kissing, coughing, sneezing, or drinking out of the same glass. Usually, you get better in 1 to  3  weeks.  However, the cough can last for even longer. HOME CARE   Only take medicine as told by your doctor. Follow instructions provided above.  Drink enough water and fluids to keep your pee (urine) clear or pale yellow.  Get plenty of rest.  Return to work when your temperature is < 100 for 24 hours or as told by your doctor. You may use a face mask and wash your hands to stop your cold from spreading. GET HELP RIGHT AWAY IF:   After the first few days, you feel you are getting worse.  You have questions about your medicine.  You have chills, shortness of breath, or red spit (mucus).  You have pain in the face for more then 1-2 days, especially when you bend forward.  You have a fever, puffy (swollen) neck, pain when you swallow, or white spots in the back of your throat.  You have a bad headache, ear pain, sinus pain, or chest pain.  You have a high-pitched whistling sound when you breathe in and out (wheezing).  You cough up blood.  You have sore muscles or a stiff neck. MAKE SURE YOU:   Understand these instructions.  Will watch your condition.  Will get help right away if you are not doing well or get worse. Document Released: 10/18/2007 Document Revised: 07/24/2011 Document Reviewed: 08/06/2013 [facility] Patient Information 2015 [place], [place]. This information is not intended to replace advice given to you by your health care provider. Make sure you discuss any questions you have with your health care provider.

## 2015-10-29 NOTE — Progress Notes (Signed)
HPI:  ? Pink eye: -started 2 days ago -red R eye, feels irritated like sand in eye, runny nosy, drainage -no fevers, drainage from eye, vision loss, pain in eye, sob, tooth pain -hx of allergies and hx of pink eye  ROS: See pertinent positives and negatives per HPI.  Past Medical History  Diagnosis Date  . Hyperlipidemia   . Hypertension   . Urinary incontinence   . Allergic rhinitis   . GERD (gastroesophageal reflux disease)   . Osteoarthritis     Past Surgical History  Procedure Laterality Date  . Cardiovascular stress test  2004    [facility], cardiolite-normal  . Cholecystectomy  [phone]  . Abdominal hysterectomy  1993    TA    Family History  Problem Relation Age of Onset  . Heart attack Father 76  . Dementia Mother   . Stroke Mother 73  . Ovarian cancer Sister 47  . Diabetes      Maternal side  . Ovarian cancer      Grandmother  . Ovarian cancer      aunt    Social History   Social History  . Marital Status: Married    Spouse Name: N/A  . Number of Children: 0  . Years of Education: N/A   Occupational History  . 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 Smoking status: Never Smoker   . Smokeless tobacco: Never Used  . Alcohol Use: No  . Drug Use: No  . Sexual Activity: No   Other Topics Concern  . None   Social History Narrative    Regular exercise: yes, Curves 2x a week   Married 48 + years    Diet: fruit and veggies, no FF, some water, artificial sweetener   HCPOA: [name], has living will, full code ( reviewed 2014)              Current outpatient prescriptions:  .  aspirin 81 MG tablet, Take 81 mg by mouth daily.  , Disp: , Rfl:  .  Calcium Carbonate-Vitamin D (CALCIUM 600+D) 600-200 MG-UNIT TABS, Take 1 tablet by mouth daily.  , Disp: , Rfl:  .  Coenzyme Q10 150 MG CAPS, Take 1 capsule by mouth daily.  , Disp: , Rfl:  .  CRANBERRY PO, Take by mouth., Disp: , Rfl:  .   diltiazem (CARDIZEM) 120 MG tablet, Take 1 tablet (120 mg total) by mouth daily., Disp: 90 tablet, Rfl: 1 .  fish oil-omega-3 fatty acids 1000 MG capsule, Take 2 g by mouth daily.  , Disp: , Rfl:  .  Glucosamine-Chondroit-Vit C-Mn (GLUCOSAMINE CHONDR 1500 COMPLX PO), Take 1 tablet by mouth daily., Disp: , Rfl:  .  glucose blood test strip, Use as instructed to check blood sugar once a day , Disp: , Rfl:  .  losartan-hydrochlorothiazide (HYZAAR) 100-25 MG tablet, Take 1 tablet by mouth daily., Disp: 90 tablet, Rfl: 1 .  LUTEIN PO, Take one by mouth daily, Disp: , Rfl:  .  metoprolol succinate (TOPROL-XL) 50 MG 24 hr tablet, Take 1 tablet (50 mg total) by mouth daily., Disp: 90 tablet, Rfl: 1 .  Multiple Vitamin (MULTIVITAMIN) tablet, Take 1 tablet by mouth daily.  , Disp: , Rfl:  .  simvastatin (ZOCOR) 40 MG tablet, Take 1 tablet (40 mg total) by mouth at bedtime., Disp: 90 tablet, Rfl: 3 .  erythromycin ophthalmic ointment, Place 1 application into the left eye at bedtime. Before bed once daily for 5 days., Disp: 3.5 g, Rfl: 0  EXAM:  Filed Vitals:   10/29/15 1333  BP: 124/80  Pulse: 59  Temp: 98.3 F (36.8 C)    Body mass index is 28.38 kg/(m^2).  GENERAL: vitals reviewed and listed above, alert, oriented, appears well hydrated and in no acute distress  HEENT: atraumatic, conjunttiva mildly erythematous bilaterally, visual acuity grossly intact, EOMI, PERRLA, no obvious abnormalities on inspection of external nose and ears, normal appearance of ear canals and TMs, clear nasal congestion, mild post oropharyngeal erythema with PND, no tonsillar edema or exudate, no sinus TTP  NECK: no obvious masses on inspection  LUNGS: clear to auscultation bilaterally, no wheezes, rales or rhonchi, good air movement  CV: HRRR, no peripheral edema  MS: moves all extremities without noticeable abnormality  PSYCH: pleasant and cooperative, no obvious depression or anxiety  ASSESSMENT AND  PLAN:  Discussed the following assessment and plan:  Conjunctivitis of right eye  Allergic rhinitis, unspecified allergic rhinitis type  -likely viral or allergic -symptomatic care, compresses and artificial tears for eyes - abx ointment only if needed -Patient advised to return or notify a doctor immediately if symptoms worsen or persist or new concerns arise.  Patient Instructions  INSTRUCTIONS FOR UPPER RESPIRATORY INFECTION:  -compresses for eye a few times per day  -flush eyes a few times per day with artificial tears  -if worsening eye symptoms after a few days or pus from eye or not improving use the ointment we prescribed per instructions.   -nasal saline wash 2-3 times daily (use prepackaged nasal saline or bottled/distilled water if making your own)   -can use AFRIN nasal spray for drainage and nasal congestion - but do NOT use longer then 3-4 days  -can use tylenol (in no history of liver disease) or ibuprofen (if no history of kidney disease, bowel bleeding or significant heart disease) as directed for aches and sorethroat  -in the winter time, using a humidifier at night is helpful (please follow cleaning instructions)  -if you are taking a cough medication - use only as directed, may also try a teaspoon of honey to coat the throat and throat lozenges. If given a cough medication with codeine or hydrocodone or other narcotic please be advised that this contains a strong and  potentially addicting medication. Please follow instructions carefully, take as little as possible and only use AS NEEDED for severe cough. Discuss potential side effects with your pharmacy. Please do not drive or operate machinery while taking these types of medications. Please do not take other sedating medications, drugs or alcohol while taking this medication without discussing with your doctor.  -for sore throat, salt water gargles can help  -follow up if you have fevers, facial pain, tooth  pain, difficulty breathing or are worsening or symptoms persist longer then expected  Upper Respiratory Infection, Adult An upper respiratory infection (URI) is also known as the common cold. It is often caused by a type of germ (virus). Colds are easily spread (contagious). You can pass it to others by kissing, coughing, sneezing, or drinking out of the same glass. Usually, you get better in 1 to 3  weeks.  However, the cough can last for even longer. HOME CARE   Only take medicine as told by your doctor. Follow instructions provided above.  Drink enough water and fluids to keep your pee (urine) clear or pale  yellow.  Get plenty of rest.  Return to work when your temperature is < 100 for 24 hours or as told by your doctor. You may use a face mask and wash your hands to stop your cold from spreading. GET HELP RIGHT AWAY IF:   After the first few days, you feel you are getting worse.  You have questions about your medicine.  You have chills, shortness of breath, or red spit (mucus).  You have pain in the face for more then 1-2 days, especially when you bend forward.  You have a fever, puffy (swollen) neck, pain when you swallow, or white spots in the back of your throat.  You have a bad headache, ear pain, sinus pain, or chest pain.  You have a high-pitched whistling sound when you breathe in and out (wheezing).  You cough up blood.  You have sore muscles or a stiff neck. MAKE SURE YOU:   Understand these instructions.  Will watch your condition.  Will get help right away if you are not doing well or get worse. Document Released: 10/18/2007 Document Revised: 07/24/2011 Document Reviewed: 08/06/2013 [facility] Patient Information 2015 [place], [place]. This information is not intended to replace advice given to you by your health care provider. Make sure you discuss any questions you have with your health care provider.      Colin Benton R.

## 2015-11-22 ENCOUNTER — Ambulatory Visit (INDEPENDENT_AMBULATORY_CARE_PROVIDER_SITE_OTHER): Payer: Medicare Other | Admitting: Family Medicine

## 2015-11-22 ENCOUNTER — Encounter: Payer: Self-pay | Admitting: Family Medicine

## 2015-11-22 ENCOUNTER — Encounter: Payer: Self-pay | Admitting: Gastroenterology

## 2015-11-22 VITALS — BP 134/76 | HR 66 | Temp 97.9°F | Wt 157.5 lb

## 2015-11-22 DIAGNOSIS — K5900 Constipation, unspecified: Secondary | ICD-10-CM | POA: Insufficient documentation

## 2015-11-22 NOTE — Progress Notes (Signed)
Subjective:   Patient ID: Janet Chapman, female    DOB: 1935/06/23, 80 y.o.   MRN: [id number]  Janet Chapman is a pleasant 80 y.o. year old female pt of Dr. [name], new to me who presents to clinic today with Constipation  on 11/22/2015  HPI:  Complains of constipation for past few months.  Very gasy.  Sometimes takes Gas X.  Started taking Culturelle.  Cannot have a bowel movement without Miralax, prune juice and "a laxative" (cannot remember the name).  Last colonoscopy in 2006.  IFOB neg 07/20/15.   Last saw Dr. [name] in 10/08/15 for diarrhea- note reviewed.   No blood in stool.  No black stools.   She feels her bowels have not been normal for months and this is concerning her.  Appetite is ok now- was decreased when her husband was in  SNF.  Wt Readings from Last 3 Encounters:  11/22/15 157 lb 8 oz (71.442 kg)  10/29/15 156 lb 6.4 oz (70.943 kg)  10/08/15 155 lb 4 oz (70.421 kg)    Current Outpatient Prescriptions on File Prior to Visit  Medication Sig Dispense Refill  . aspirin 81 MG tablet Take 81 mg by mouth daily.      . Calcium Carbonate-Vitamin D (CALCIUM 600+D) 600-200 MG-UNIT TABS Take 1 tablet by mouth daily.      . Coenzyme Q10 150 MG CAPS Take 1 capsule by mouth daily.      Marland Kitchen CRANBERRY PO Take by mouth.    . diltiazem (CARDIZEM) 120 MG tablet Take 1 tablet (120 mg total) by mouth daily. 90 tablet 1  . erythromycin ophthalmic ointment Place 1 application into the left eye at bedtime. Before bed once daily for 5 days. 3.5 g 0  . fish oil-omega-3 fatty acids 1000 MG capsule Take 2 g by mouth daily.      . Glucosamine-Chondroit-Vit C-Mn (GLUCOSAMINE CHONDR 1500 COMPLX PO) Take 1 tablet by mouth daily.    Marland Kitchen glucose blood test strip Use as instructed to check blood sugar once a day     . losartan-hydrochlorothiazide (HYZAAR) 100-25 MG tablet Take 1 tablet by mouth daily. 90 tablet 1  . LUTEIN PO Take one by mouth daily    . metoprolol succinate (TOPROL-XL) 50  MG 24 hr tablet Take 1 tablet (50 mg total) by mouth daily. 90 tablet 1  . Multiple Vitamin (MULTIVITAMIN) tablet Take 1 tablet by mouth daily.      . simvastatin (ZOCOR) 40 MG tablet Take 1 tablet (40 mg total) by mouth at bedtime. 90 tablet 3   No current facility-administered medications on file prior to visit.    Allergies  Allergen Reactions  . Codeine     REACTION: nausea    Past Medical History  Diagnosis Date  . Hyperlipidemia   . Hypertension   . Urinary incontinence   . Allergic rhinitis   . GERD (gastroesophageal reflux disease)   . Osteoarthritis     Past Surgical History  Procedure Laterality Date  . Cardiovascular stress test  2004    [facility], cardiolite-normal  . Cholecystectomy  [phone]  . Abdominal hysterectomy  1993    TA    Family History  Problem Relation Age of Onset  . Heart attack Father 8  . Dementia Mother   . Stroke Mother 70  . Ovarian cancer Sister 26  . Diabetes      Maternal side  . Ovarian cancer      Grandmother  .  Ovarian cancer      aunt    Social History   Social History  . Marital Status: Married    Spouse Name: N/A  . Number of Children: 0  . Years of Education: N/A   Occupational History  . 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 Smoking status: Never Smoker   . Smokeless tobacco: Never Used  . Alcohol Use: No  . Drug Use: No  . Sexual Activity: No   Other Topics Concern  . Not on file   Social History Narrative    Regular exercise: yes, [employer] 2x a week   Married 48 + years    Diet: fruit and veggies, no FF, some water, artificial sweetener   HCPOA: [name], has living will, full code ( reviewed 2014)            The PMH, PSH, Social History, Family History, Medications, and allergies have been reviewed in [facility], and have been updated if relevant.   Review of Systems  Gastrointestinal: Positive for constipation and abdominal distention.  Negative for nausea, vomiting, abdominal pain, diarrhea, blood in stool, anal bleeding and rectal pain.  All other systems reviewed and are negative.      Objective:    BP 134/76 mmHg  Pulse 66  Temp(Src) 97.9 F (36.6 C) (Oral)  Wt 157 lb 8 oz (71.442 kg)  SpO2 97%   Physical Exam  Constitutional: She is oriented to person, place, and time. She appears well-developed and well-nourished.  HENT:  Head: Normocephalic.  Eyes: Conjunctivae are normal.  Cardiovascular: Normal rate.   Abdominal: Soft. Bowel sounds are normal. She exhibits no distension and no mass. There is no tenderness. There is no rebound and no guarding.  Musculoskeletal: Normal range of motion.  Neurological: She is alert and oriented to person, place, and time. No cranial nerve deficit.  Skin: Skin is warm and dry.  Psychiatric: She has a normal mood and affect. Her behavior is normal. Judgment and thought content normal.          Assessment & Plan:   Constipation, unspecified constipation type No Follow-up on file.

## 2015-11-22 NOTE — Progress Notes (Signed)
Pre visit review using our clinic review tool, if applicable. No additional management support is needed unless otherwise documented below in the visit note. 

## 2015-11-22 NOTE — Assessment & Plan Note (Signed)
With intermittent diarrhea. ? IBS She is quite concerned about these symptoms and I agree that referral back to GI is warranted for probable colonoscopy. Referral placed. The patient indicates understanding of these issues and agrees with the plan.

## 2015-11-22 NOTE — Patient Instructions (Signed)
Great to see you. Please stop by to see Janet Chapman on your way out.

## 2015-11-30 ENCOUNTER — Encounter: Payer: Self-pay | Admitting: Gastroenterology

## 2015-11-30 ENCOUNTER — Ambulatory Visit (INDEPENDENT_AMBULATORY_CARE_PROVIDER_SITE_OTHER): Payer: Medicare Other | Admitting: Gastroenterology

## 2015-11-30 VITALS — BP 142/78 | HR 88 | Ht 62.25 in | Wt 157.0 lb

## 2015-11-30 DIAGNOSIS — R159 Full incontinence of feces: Secondary | ICD-10-CM | POA: Diagnosis not present

## 2015-11-30 DIAGNOSIS — K5902 Outlet dysfunction constipation: Secondary | ICD-10-CM

## 2015-11-30 MED ORDER — NA SULFATE-K SULFATE-MG SULF 17.5-3.13-1.6 GM/177ML PO SOLN: 1.0000 | Freq: Once | ORAL | Status: DC

## 2015-11-30 NOTE — Patient Instructions (Signed)
We are referring you to Pelvic Floor rehabilitation with Earlie Counts, You will be contacted with that appointment Continue prune juice with 1 capful of Miralax daily before breakfast   You have been scheduled for a colonoscopy. Please follow written instructions given to you at your visit today.  Please pick up your prep supplies at the pharmacy within the next 1-3 days. If you use inhalers (even only as needed), please bring them with you on the day of your procedure. Your physician has requested that you go to www.startemmi.com and enter the access code given to you at your visit today. This web site gives a general overview about your procedure. However, you should still follow specific instructions given to you by our office regarding your preparation for the procedure.

## 2015-12-02 ENCOUNTER — Encounter: Payer: Self-pay | Admitting: Physical Therapy

## 2015-12-02 ENCOUNTER — Ambulatory Visit: Payer: Medicare Other | Attending: Gastroenterology | Admitting: Physical Therapy

## 2015-12-02 DIAGNOSIS — M6281 Muscle weakness (generalized): Secondary | ICD-10-CM | POA: Diagnosis present

## 2015-12-02 DIAGNOSIS — R279 Unspecified lack of coordination: Secondary | ICD-10-CM

## 2015-12-02 NOTE — Patient Instructions (Signed)
About Abdominal Massage  Abdominal massage, also called external colon massage, is a self-treatment circular massage technique that can reduce and eliminate gas and ease constipation. The colon naturally contracts in waves in a clockwise direction starting from inside the right hip, moving up toward the ribs, across the belly, and down inside the left hip.  When you perform circular abdominal massage, you help stimulate your colon's normal wave pattern of movement called peristalsis.  It is most beneficial when done after eating.  Positioning You can practice abdominal massage with oil while lying down, or in the shower with soap.  Some people find that it is just as effective to do the massage through clothing while sitting or standing.  How to Massage Start by placing your finger tips or knuckles on your right side, just inside your hip bone.  . Make small circular movements while you move upward toward your rib cage.   . Once you reach the bottom right side of your rib cage, take your circular movements across to the left side of the bottom of your rib cage.  . Next, move downward until you reach the inside of your left hip bone.  This is the path your feces travel in your colon. . Continue to perform your abdominal massage in this pattern for 10 minutes each day.     You can apply as much pressure as is comfortable in your massage.  Start gently and build pressure as you continue to practice.  Notice any areas of pain as you massage; areas of slight pain may be relieved as you massage, but if you have areas of significant or intense pain, consult with your healthcare provider.  Other Considerations . General physical activity including bending and stretching can have a beneficial massage-like effect on the colon.  Deep breathing can also stimulate the colon because breathing deeply activates the same nervous system that supplies the colon.   . Abdominal massage should always be used in  combination with a bowel-conscious diet that is high in the proper type of fiber for you, fluids (primarily water), and a regular exercise program. Toileting Techniques for Bowel Movements (Defecation) Using your belly (abdomen) and pelvic floor muscles to have a bowel movement is usually instinctive.  Sometimes people can have problems with these muscles and have to relearn proper defecation (emptying) techniques.  If you have weakness in your muscles, organs that are falling out, decreased sensation in your pelvis, or ignore your urge to go, you may find yourself straining to have a bowel movement.  You are straining if you are: . holding your breath or taking in a huge gulp of air and holding it  . keeping your lips and jaw tensed and closed tightly . turning red in the face because of excessive pushing or forcing . developing or worsening your  hemorrhoids . getting faint while pushing . not emptying completely and have to defecate many times a day  If you are straining, you are actually making it harder for yourself to have a bowel movement.  Many people find they are pulling up with the pelvic floor muscles and closing off instead of opening the anus. Due to lack pelvic floor relaxation and coordination the abdominal muscles, one has to work harder to push the feces out.  Many people have never been taught how to defecate efficiently and effectively.  Notice what happens to your body when you are having a bowel movement.  While you are sitting on the   toilet pay attention to the following areas: . Jaw and mouth position . Angle of your hips   . Whether your feet touch the ground or not . Arm placement  . Spine position . Waist . Belly tension . Anus (opening of the anal canal)  An Evacuation/Defecation Plan   Here are the 4 basic points:  1. Lean forward enough for your elbows to rest on your knees 2. Support your feet on the floor or use a low stool if your feet don't touch the floor   3. Push out your belly as if you have swallowed a beach ball-you should feel a widening of your waist 4. Open and relax your pelvic floor muscles, rather than tightening around the anus      The following conditions my require modifications to your toileting posture:  . If you have had surgery in the past that limits your back, hip, pelvic, knee or ankle flexibility . Constipation   Your healthcare practitioner may make the following additional suggestions and adjustments:  1) Sit on the toilet  a) Make sure your feet are supported. b) Notice your hip angle and spine position-most people find it effective to lean forward or raise their knees, which can help the muscles around the anus to relax  c) When you lean forward, place your forearms on your thighs for support  2) Relax suggestions a) Breath deeply in through your nose and out slowly through your mouth as if you are smelling the flowers and blowing out the candles. b) To become aware of how to relax your muscles, contracting and releasing muscles can be helpful.  Pull your pelvic floor muscles in tightly by using the image of holding back gas, or closing around the anus (visualize making a circle smaller) and lifting the anus up and in.  Then release the muscles and your anus should drop down and feel open. Repeat 5 times ending with the feeling of relaxation. c) Keep your pelvic floor muscles relaxed; let your belly bulge out. d) The digestive tract starts at the mouth and ends at the anal opening, so be sure to relax both ends of the tube.  Place your tongue on the roof of your mouth with your teeth separated.  This helps relax your mouth and will help to relax the anus at the same time.  3) Empty (defecation) a) Keep your pelvic floor and sphincter relaxed, then bulge your anal muscles.  Make the anal opening wide.  b) Stick your belly out as if you have swallowed a beach ball. c) Make your belly wall hard using your belly  muscles while continuing to breathe. Doing this makes it easier to open your anus. d) Breath out and give a grunt (or try using other sounds such as ahhhh, shhhhh, ohhhh or grrrrrrr).  4) Finish a) As you finish your bowel movement, pull the pelvic floor muscles up and in.  This will leave your anus in the proper place rather than remaining pushed out and down. If you leave your anus pushed out and down, it will start to feel as though that is normal and give you incorrect signals about needing to have a bowel movement.    Cheryl Gray, PT [facility] [address] [place], [place] [address]  

## 2015-12-02 NOTE — Therapy (Signed)
[facility] [address] [place], [place], [address] Phone: [phone]   Fax:  [phone]  Physical Therapy Evaluation  Patient Details  Name: Janet Chapman MRN: [id number] Date of Birth: [birth year] Referring Provider: dr. Harl Bowie  Encounter Date: 12/02/2015      PT End of Session - 12/02/15 1241    Visit Number 1   Number of Visits 10   Date for PT Re-Evaluation 01/27/16   Authorization Type medicare; g-code 10th visit   PT Start Time 1100   PT Stop Time 1145   PT Time Calculation (min) 45 min   Activity Tolerance Patient tolerated treatment well   Behavior During Therapy [facility] for tasks assessed/performed      Past Medical History  Diagnosis Date  . Hyperlipidemia   . Hypertension   . Urinary incontinence   . Allergic rhinitis   . GERD (gastroesophageal reflux disease)   . Osteoarthritis     Past Surgical History  Procedure Laterality Date  . Cardiovascular stress test  2004    H. Smith, cardiolite-normal  . Cholecystectomy  [phone]  . Abdominal hysterectomy  1993    TA    There were no vitals filed for this visit.       Subjective Assessment - 12/02/15 1107    Subjective Patient reports 03/30/2015 she started to have a problem.  Patient spent 2 months in hospital to take care of her husband.  She noticed fecal incontinence. Janet Chapman 2017 started to have trouble having a bowel movment and was bloated. Patient is full of gas. Cannot have a bowel movement  without stool softner for the past 6 months.    Patient Stated Goals Reduce fecal incontinence   Currently in Pain? Yes   Pain Score 2    Pain Location Abdomen   Pain Orientation Lower   Pain Descriptors / Indicators Pressure   Pain Onset More than a month ago   Pain Frequency Intermittent   Aggravating Factors  gas pain   Pain Relieving Factors having a bowel movement            [facility] PT Assessment - 12/02/15 0001    Assessment   Medical Diagnosis K59.02 Constipation, outlet dysfunction; R15.9 Fecal incontinence   Referring Provider dr. Harl Bowie   Onset Date/Surgical Date 03/29/16   Prior Therapy None   Precautions   Precautions None   Restrictions   Weight Bearing Restrictions No   Balance Screen   Has the patient fallen in the past 6 months No   Has the patient had a decrease in activity level because of a fear of falling?  No   Is the patient reluctant to leave their home because of a fear of falling?  No   Home Ecologist residence   Prior Function   Level of Independence Independent   Vocation Retired   Observation/Other Assessments   Focus on Therapeutic Outcomes (FOTO)  53% limitation for bowel survey  42% limitation CK for bowel CNST survey   ROM / Strength   AROM / PROM / Strength AROM;Strength;PROM   AROM   Lumbar Extension decreased by 25%   Lumbar - Right Side Bend decresaed by 25%   PROM   Right Hip External Rotation  40   Left Hip External Rotation  30   Strength   Overall Strength Comments abdominal strength is 2/5   Palpation   SI assessment  left ilium is rotated  posteriorly rotated                 Pelvic Floor Special Questions - 12/02/15 0001    Urinary Leakage Yes   Pad use 1   Activities that cause leaking Coughing;With strong urge   Urinary urgency Yes   Fecal incontinence Yes  1 time per week   Pelvic Floor Internal Exam Patient confirms identification and approves PT to asess muscle integrity and strength   Exam Type Rectal   Palpation anterior rectal weaker contraction;    Strength weak squeeze, no lift  4/5 rectal   Strength # of seconds 2  vaginal                  PT Education - 12/02/15 1539    Education provided Yes   Education Details abominal massage, toileting technique   Methods Explanation;Demonstration;Verbal cues;Handout   Comprehension Returned demonstration;Verbalized  understanding          PT Short Term Goals - 12/02/15 1553    PT SHORT TERM GOAL #1   Title independent with initial HEP   Time 4   Period Weeks   Status New   PT SHORT TERM GOAL #2   Title fecal incontinence decresaed >/= 25%   Time 4   Period Weeks   Status New   PT SHORT TERM GOAL #3   Title urinary incontinence decreased >/= 25%   Time 4   Period Weeks   Status New   PT SHORT TERM GOAL #4   Title ability to brace abdominals with pelvic floor contraction    Time 4   Period Weeks   Status New   PT SHORT TERM GOAL #5   Title understand bladder irritants and how they affect the bladder   Time 4   Period Weeks   Status New           PT Long Term Goals - 12/02/15 1555    PT LONG TERM GOAL #1   Title independent with HEP    Time 8   Period Weeks   Status New   PT LONG TERM GOAL #2   Title fecal incontinence decreased >/= 75% due to increased anal sphinter control and strength   Time 8   Period Weeks   Status New   PT LONG TERM GOAL #3   Title urinary leakage decreased >/= 50% due to pelvic floor strength >/= 3/5 holding for 10 seconds   Time 8   Period Weeks   Status New   PT LONG TERM GOAL #4   Title bloating and pressure feeling in the abdominal region decreased >/= 50%    Time 8   Period Weeks   Status New   PT LONG TERM GOAL #5   Title straining to have a bowel movement decreased >/= 50%   Time 8   Period Weeks   Status New               Plan - 12/02/15 1244    Clinical Impression Statement Patient is a 80 year old female with urinary and fecal incontinence since 11/207 when she was sittig with her husband in the hospital for 2 months to care for him.   Patient reports no pain. Abdominal strength is 2/5.  Rectal strength is 4/5 with less pressure on the anterior anal sphincter.  Vaginal strength is 2/5 with no lift for 2 seconds. Patient reports she will leak feces after a bowel movment and without  realizing it.  Patient always feels  pressure in abdomen and bloated.  Patient reports she has to strain to have a bowel movement.  Patient can only have a bowel movement when she takes stool softner.  Patient will benefit from physical therapy  to improve strength and coordination of pelvic floor.    Rehab Potential Excellent   Clinical Impairments Affecting Rehab Potential None   PT Frequency 1x / week   PT Duration 8 weeks   PT Treatment/Interventions Biofeedback;Therapeutic activities;Therapeutic exercise;Patient/family education;Neuromuscular re-education;Manual techniques;Passive range of motion;ADLs/Self Care Home Management   PT Next Visit Plan abdominal bracing, abdominal massage, stretches    PT Home Exercise Plan stretches.    Recommended Other Services None   Consulted and Agree with Plan of Care Patient      Patient will benefit from skilled therapeutic intervention in order to improve the following deficits and impairments:  Decreased activity tolerance, Decreased endurance, Decreased strength, Decreased range of motion, Decreased coordination, Decreased mobility  Visit Diagnosis: Muscle weakness (generalized) - Plan: PT plan of care cert/re-cert  Unspecified lack of coordination - Plan: PT plan of care cert/re-cert      G-Codes - [id number]    Functional Assessment Tool Used FOTO score is 53% limitation for bowel cnst survey  goal is 42% limitation   Functional Limitation Other PT primary   Other PT Primary Current Status [id number]) At least 40 percent but less than 60 percent impaired, limited or restricted   Other PT Primary Goal Status [id number]) At least 40 percent but less than 60 percent impaired, limited or restricted       Problem List Patient Active Problem List   Diagnosis Date Noted  . Constipation 11/22/2015  . Diarrhea 10/08/2015  . Mass of right forearm 09/29/2015  . Spider varicose veins 07/22/2015  . Advanced care planning/counseling discussion 07/08/2015  . Cerumen impaction  07/08/2015  . Dysuria 07/02/2015  . Change in bowel habits 02/18/2015  . 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 Contact dermatitis due to plant 10/16/2014  . Chronic radicular low back pain 01/02/2014  . Atrophic vaginitis 09/26/2013  . Arthralgia 06/06/2013  . Pulmonary nodule, right 05/23/2013  . De Quervain's tenosynovitis, left 04/20/2013  . Osteopenia 07/26/2012  . INSOMNIA, CHRONIC 01/14/2010  . Diabetes mellitus, controlled ([place]) 11/11/2008  . MAMMOGRAM, ABNORMAL, LEFT 11/09/2008  . Chronic idiopathic constipation 09/11/2007  . OBESITY 05/31/2007  . ALLERGIC RHINITIS 05/31/2007  . GERD 05/31/2007  . OSTEOARTHRITIS 05/31/2007  . URINARY INCONTINENCE 05/31/2007  . High cholesterol 04/19/2007  . Essential hypertension, benign 04/19/2007  . ACTINIC KERATOSIS 02/01/2007    Earlie Counts, PT 12/02/2015 4:00 PM   [facility] [address] [place], [place], [address] Phone: [phone]   Fax:  [phone]  Name: Janet Chapman MRN: [id number] Date of Birth: January 18, 1936

## 2015-12-06 ENCOUNTER — Telehealth: Payer: Self-pay | Admitting: Gastroenterology

## 2015-12-06 NOTE — Telephone Encounter (Signed)
Left message for pt to pick up prep kit   Put prep out front for patient

## 2015-12-09 ENCOUNTER — Encounter: Payer: Self-pay | Admitting: Gastroenterology

## 2015-12-09 ENCOUNTER — Ambulatory Visit (AMBULATORY_SURGERY_CENTER): Payer: Medicare Other | Admitting: Gastroenterology

## 2015-12-09 VITALS — BP 110/51 | HR 55 | Temp 96.8°F | Resp 16 | Ht 62.0 in | Wt 157.0 lb

## 2015-12-09 DIAGNOSIS — Z1211 Encounter for screening for malignant neoplasm of colon: Secondary | ICD-10-CM

## 2015-12-09 DIAGNOSIS — K5902 Outlet dysfunction constipation: Secondary | ICD-10-CM

## 2015-12-09 MED ORDER — SODIUM CHLORIDE 0.9 % IV SOLN: INTRAVENOUS | Status: DC

## 2015-12-09 NOTE — Patient Instructions (Signed)
YOU HAD AN ENDOSCOPIC PROCEDURE TODAY AT THE [facility]:   Refer to the procedure report that was given to you for any specific questions about what was found during the examination.  If the procedure report does not answer your questions, please call your gastroenterologist to clarify.  If you requested that your care partner not be given the details of your procedure findings, then the procedure report has been included in a sealed envelope for you to review at your convenience later.  YOU SHOULD EXPECT: Some feelings of bloating in the abdomen. Passage of more gas than usual.  Walking can help get rid of the air that was put into your GI tract during the procedure and reduce the bloating. If you had a lower endoscopy (such as a colonoscopy or flexible sigmoidoscopy) you may notice spotting of blood in your stool or on the toilet paper. If you underwent a bowel prep for your procedure, you may not have a normal bowel movement for a few days.  Please Note:  You might notice some irritation and congestion in your nose or some drainage.  This is from the oxygen used during your procedure.  There is no need for concern and it should clear up in a day or so.  SYMPTOMS TO REPORT IMMEDIATELY:   Following lower endoscopy (colonoscopy or flexible sigmoidoscopy):  Excessive amounts of blood in the stool  Significant tenderness or worsening of abdominal pains  Swelling of the abdomen that is new, acute  Fever of 100F or higher   For urgent or emergent issues, a gastroenterologist can be reached at any hour by calling [phone].   DIET: Your first meal following the procedure should be a small meal and then it is ok to progress to your normal diet. Heavy or fried foods are harder to digest and may make you feel nauseous or bloated.  Likewise, meals heavy in dairy and vegetables can increase bloating.  Drink plenty of fluids but you should avoid alcoholic beverages for 24  hours.  ACTIVITY:  You should plan to take it easy for the rest of today and you should NOT DRIVE or use heavy machinery until tomorrow (because of the sedation medicines used during the test).    FOLLOW UP: Our staff will call the number listed on your records the next business day following your procedure to check on you and address any questions or concerns that you may have regarding the information given to you following your procedure. If we do not reach you, we will leave a message.  However, if you are feeling well and you are not experiencing any problems, there is no need to return our call.  We will assume that you have returned to your regular daily activities without incident.  If any biopsies were taken you will be contacted by phone or by letter within the next 1-3 weeks.  Please call us at [phone] if you have not heard about the biopsies in 3 weeks.    SIGNATURES/CONFIDENTIALITY: You and/or your care partner have signed paperwork which will be entered into your electronic medical record.  These signatures attest to the fact that that the information above on your After Visit Summary has been reviewed and is understood.  Full responsibility of the confidentiality of this discharge information lies with you and/or your care-partner. 

## 2015-12-09 NOTE — Op Note (Signed)
[facility] Patient Name: Janet Chapman Procedure Date: 12/09/2015 10:01 AM MRN: [id number] Endoscopist: Mauri Pole , MD Age: 80 Referring MD:  Date of Birth: [birth year] Gender: Female Account #: [id number] Procedure:                Colonoscopy Indications:              Screening for colorectal malignant neoplasm, Last                            colonoscopy: 2006 Medicines:                Monitored Anesthesia Care Procedure:                Pre-Anesthesia Assessment:                           - Prior to the procedure, a History and Physical                            was performed, and patient medications and                            allergies were reviewed. The patient's tolerance of                            previous anesthesia was also reviewed. The risks                            and benefits of the procedure and the sedation                            options and risks were discussed with the patient.                            All questions were answered, and informed consent                            was obtained. Prior Anticoagulants: The patient has                            taken no previous anticoagulant or antiplatelet                            agents. ASA Grade Assessment: II - A patient with                            mild systemic disease. After reviewing the risks                            and benefits, the patient was deemed in                            satisfactory condition to undergo the procedure.  After obtaining informed consent, the colonoscope                            was passed under direct vision. Throughout the                            procedure, the patient's blood pressure, pulse, and                            oxygen saturations were monitored continuously. The                            Model CF-HQ190L [id number]) scope was introduced                            through the anus and advanced to the  the terminal                            ileum, with identification of the appendiceal                            orifice and IC valve. The colonoscopy was performed                            without difficulty. The patient tolerated the                            procedure well. The quality of the bowel                            preparation was good. The ileocecal valve,                            appendiceal orifice, and rectum were photographed. Scope In: 10:13:04 AM Scope Out: 10:38:16 AM Scope Withdrawal Time: 0 hours 10 minutes 50 seconds  Total Procedure Duration: 0 hours 25 minutes 12 seconds  Findings:                 The perianal and digital rectal examinations were                            normal.                           Multiple small and large-mouthed diverticula were                            found in the sigmoid colon and descending colon.                            There was narrowing of the colon in association                            with the diverticular opening. There was evidence  of diverticular spasm. Few small scattered                            diverticula in ascending and transverse colon                           Non-bleeding internal hemorrhoids were found during                            retroflexion. The hemorrhoids were small.                           The exam was otherwise without abnormality. Complications:            No immediate complications. Estimated Blood Loss:     Estimated blood loss: none. Impression:               - Severe diverticulosis in the sigmoid colon and in                            the descending colon. There was narrowing of the                            colon in association with the diverticular opening.                            There was evidence of diverticular spasm.                           - Non-bleeding internal hemorrhoids.                           - The examination was otherwise  normal.                           - No specimens collected. Recommendation:           - Patient has a contact number available for                            emergencies. The signs and symptoms of potential                            delayed complications were discussed with the                            patient. Return to normal activities tomorrow.                            Written discharge instructions were provided to the                            patient.                           - Resume previous diet.                           -  Continue present medications.                           - No recommendation at this time regarding repeat                            colonoscopy due to age.                           - Return to GI clinic PRN. Mauri Pole, MD 12/09/2015 10:43:53 AM This report has been signed electronically.

## 2015-12-09 NOTE — Progress Notes (Signed)
A and O x3. Report to RN. Tolerated MAC anesthesia well. 

## 2015-12-10 ENCOUNTER — Telehealth: Payer: Self-pay | Admitting: *Deleted

## 2015-12-10 LAB — HM DIABETES EYE EXAM

## 2015-12-10 NOTE — Telephone Encounter (Signed)
  Follow up Call-  Call back number 12/09/2015  Post procedure Call Back phone  # [phone]  Permission to leave phone message Yes  Some recent data might be hidden     Patient questions:  Do you have a fever, pain , or abdominal swelling? No. Pain Score  0 *  Have you tolerated food without any problems? Yes.    Have you been able to return to your normal activities? Yes.    Do you have any questions about your discharge instructions: Diet   No. Medications  No. Follow up visit  No.  Do you have questions or concerns about your Care? No.  Actions: * If pain score is 4 or above: No action needed, pain <4.

## 2015-12-13 ENCOUNTER — Ambulatory Visit: Payer: Medicare Other | Admitting: Physical Therapy

## 2015-12-13 NOTE — Progress Notes (Signed)
Janet Chapman    [id number]    05-Mar-1936  Primary Care Physician:Amy [name], MD  Referring Physician: [name], MD [address] [place], [place] [address]  Chief complaint:  Constipation   HPI: 80 year old female here with complaints of progressively worsening constipation for the past 3-4 months, her last colonoscopy was in 2006 by Dr. Wynetta Emery at [facility] gastro[facility]ology. No family history of colon cancer. She has sensation of incomplete evacuation and also intermittent fecal incontinence. Denies any nausea, vomiting, abdominal pain, melena or bright red blood per rectum    Outpatient Encounter Prescriptions as of 11/30/2015  Medication Sig  . aspirin 81 MG tablet Take 81 mg by mouth daily.    . Calcium Carbonate-Vitamin D (CALCIUM 600+D) 600-200 MG-UNIT TABS Take 1 tablet by mouth daily.    . Coenzyme Q10 150 MG CAPS Take 1 capsule by mouth daily.    Marland Kitchen CRANBERRY PO Take by mouth.  . diltiazem (CARDIZEM) 120 MG tablet Take 1 tablet (120 mg total) by mouth daily.  Marland Kitchen erythromycin ophthalmic ointment Place 1 application into the left eye at bedtime. Before bed once daily for 5 days. (Patient not taking: Reported on 12/09/2015)  . fish oil-omega-3 fatty acids 1000 MG capsule Take 2 g by mouth daily.    . Glucosamine-Chondroit-Vit C-Mn (GLUCOSAMINE CHONDR 1500 COMPLX PO) Take 1 tablet by mouth daily.  Marland Kitchen glucose blood test strip Use as instructed to check blood sugar once a day   . Lactobacillus-Inulin (CULTURELLE DIGESTIVE HEALTH PO) Take 1 capsule by mouth daily.  Marland Kitchen losartan-hydrochlorothiazide (HYZAAR) 100-25 MG tablet Take 1 tablet by mouth daily.  . LUTEIN PO Take one by mouth daily  . metoprolol succinate (TOPROL-XL) 50 MG 24 hr tablet Take 1 tablet (50 mg total) by mouth daily.  . Multiple Vitamin (MULTIVITAMIN) tablet Take 1 tablet by mouth daily.    . simvastatin (ZOCOR) 40 MG tablet Take 1 tablet (40 mg total) by mouth at bedtime.  . [DISCONTINUED] Na  Sulfate-K Sulfate-Mg Sulf (SUPREP BOWEL PREP KIT) 17.5-3.13-1.6 GM/180ML SOLN Take 1 kit by mouth once.   No facility-administered encounter medications on file as of 11/30/2015.     Allergies as of 11/30/2015 - Review Complete 11/30/2015  Allergen Reaction Noted  . Codeine      Past Medical History:  Diagnosis Date  . Allergic rhinitis   . Diabetes mellitus without complication (HCC)    diet controlled  . GERD (gastroesophageal reflux disease)   . Hyperlipidemia   . Hypertension   . Osteoarthritis   . Urinary incontinence     Past Surgical History:  Procedure Laterality Date  . ABDOMINAL HYSTERECTOMY  1993   TA  . CARDIOVASCULAR STRESS TEST  2004   H. Smith, cardiolite-normal  . CHOLECYSTECTOMY  [phone]  . COLONOSCOPY      Family History  Problem Relation Age of Onset  . Heart attack Father 70  . Dementia Mother   . Stroke Mother 74  . Ovarian cancer Sister 60  . Diabetes      Maternal side  . Ovarian cancer      Grandmother  . Ovarian cancer      aunt  . Colon cancer Neg Hx     Social History   Social History  . Marital status: Married    Spouse name: N/A  . Number of children: 0  . Years of education: N/A   Occupational History  .  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 Smoking status: Never Smoker  . Smokeless tobacco: Never Used  . Alcohol use Yes     Comment: rarely  . Drug use: No  . Sexual activity: No   Other Topics Concern  . Not on file   Social History Narrative    Regular exercise: yes, Curves 2x a week   Married 48 + years    Diet: fruit and veggies, no FF, some water, artificial sweetener   HCPOA: [name], has living will, full code ( reviewed 2014)               Review of systems: Review of Systems  Constitutional: Negative for fever and chills.  HENT: Negative.   Eyes: Negative for blurred vision.  Respiratory: Negative for cough, shortness of breath and  wheezing.   Cardiovascular: Negative for chest pain and palpitations.  Gastrointestinal: as per HPI Genitourinary: Negative for dysuria, urgency, frequency and hematuria.  Musculoskeletal: Negative for myalgias, back pain and joint pain.  Skin: Negative for itching and rash.  Neurological: Negative for dizziness, tremors, focal weakness, seizures and loss of consciousness.  Psychiatric/Behavioral: Negative for depression, suicidal ideas and hallucinations.  All other systems reviewed and are negative.   Physical Exam: Vitals:   11/30/15 1032  BP: (!) 142/78  Pulse: 88   Gen:      No acute distress HEENT:  EOMI, sclera anicteric Neck:     No masses; no thyromegaly Lungs:    Clear to auscultation bilaterally; normal respiratory effort CV:         Regular rate and rhythm; no murmurs Abd:      + bowel sounds; soft, non-tender; no palpable masses, no distension Ext:    No edema; adequate peripheral perfusion Skin:      Warm and dry; no rash Neuro: alert and oriented x 3 Psych: normal mood and affect  Data Reviewed:  Reviewed chart in epic   Assessment and Plan/Recommendations:  80 year old female with complaints of progressive constipation for the past few months associated with incomplete evacuation and intermittent fecal incontinence Due for screening colonoscopy, schedule it Advise patient to start taking 1 capful MiraLAX daily and titrate as needed based on symptoms Refer to pelvic floor physical therapy for biofeedback and also to improve the anal sphincter tone  25 minutes was spent face-to-face with the patient. Greater than 50% of the time used for counseling as well as treatment plan and follow-up. She had multiple questions which were answered to her satisfaction    K. Denzil Magnuson , MD [phone] Mon-Fri 8a-5p [phone] after 5p, weekends, holidays  CC: [name], MD

## 2015-12-14 ENCOUNTER — Other Ambulatory Visit: Payer: Self-pay | Admitting: *Deleted

## 2015-12-14 MED ORDER — METOPROLOL SUCCINATE ER 50 MG PO TB24
50.0000 mg | ORAL_TABLET | Freq: Every day | ORAL | 1 refills | Status: DC
Start: 2015-12-14 — End: 2016-06-15

## 2015-12-14 MED ORDER — LOSARTAN POTASSIUM-HCTZ 100-25 MG PO TABS: 1.0000 | ORAL_TABLET | Freq: Every day | ORAL | 1 refills | Status: DC

## 2015-12-22 ENCOUNTER — Encounter: Payer: Self-pay | Admitting: Physical Therapy

## 2015-12-22 ENCOUNTER — Ambulatory Visit: Payer: Medicare Other | Attending: Gastroenterology | Admitting: Physical Therapy

## 2015-12-22 DIAGNOSIS — R279 Unspecified lack of coordination: Secondary | ICD-10-CM | POA: Diagnosis present

## 2015-12-22 DIAGNOSIS — M6281 Muscle weakness (generalized): Secondary | ICD-10-CM | POA: Diagnosis present

## 2015-12-22 NOTE — Therapy (Signed)
[facility] [address], [place], [place], [address] Phone: [phone]   Fax:  [phone]  Physical Therapy Treatment  Patient Details  Name: Janet Chapman MRN: [id number] Date of Birth: 07-13-1935 Referring Provider: dr. Harl Bowie  Encounter Date: 12/22/2015      PT End of Session - 12/22/15 1447    Visit Number 2   Number of Visits 10   Date for PT Re-Evaluation 01/27/16   Authorization Type medicare; g-code 10th visit   PT Start Time 1400   PT Stop Time 1442   PT Time Calculation (min) 42 min   Activity Tolerance Patient tolerated treatment well   Behavior During Therapy [facility] for tasks assessed/performed      Past Medical History:  Diagnosis Date  . Allergic rhinitis   . Diabetes mellitus without complication (HCC)    diet controlled  . GERD (gastroesophageal reflux disease)   . Hyperlipidemia   . Hypertension   . Osteoarthritis   . Urinary incontinence     Past Surgical History:  Procedure Laterality Date  . ABDOMINAL HYSTERECTOMY  1993   TA  . CARDIOVASCULAR STRESS TEST  2004   H. Smith, cardiolite-normal  . CHOLECYSTECTOMY  [phone]  . COLONOSCOPY      There were no vitals filed for this visit.      Subjective Assessment - 12/22/15 1406    Subjective I had a colonoscopy and found out I have divierticulitis. I am eating more fiber.  I am taking Miralax.     Patient Stated Goals Reduce fecal incontinence   Currently in Pain? Yes   Pain Score 2    Pain Location Abdomen   Pain Orientation Lower   Pain Descriptors / Indicators Pressure   Pain Type Chronic pain   Pain Onset More than a month ago   Pain Frequency Intermittent   Aggravating Factors  gas    Pain Relieving Factors have a bowel movement   Multiple Pain Sites No                         [facility] Adult PT Treatment/Exercise - 12/22/15 0001      Manual Therapy   Manual Therapy Soft tissue  mobilization;Myofascial release   Soft tissue mobilization soft tissue work to abdoment to [employer] movement of the large intestines x10 min   Myofascial Release ot sigmoid , bil. lower abdominal area to release 3 planes of fascia                PT Education - 12/22/15 1444    Education provided Yes   Education Details toileting technique, information on fiber and diet, diphragmatic breathing   Person(s) Educated Patient   Methods Explanation;Demonstration;Verbal cues;Handout   Comprehension Returned demonstration;Verbalized understanding          PT Short Term Goals - 12/22/15 1447      PT SHORT TERM GOAL #1   Title independent with initial HEP   Time 4   Period Weeks   Status On-going     PT SHORT TERM GOAL #2   Title fecal incontinence decresaed >/= 25%   Time 4   Period Weeks   Status On-going     PT SHORT TERM GOAL #3   Title urinary incontinence decreased >/= 25%   Time 4   Period Weeks   Status On-going     PT SHORT TERM GOAL #4   Title ability to  brace abdominals with pelvic floor contraction    Time 4   Period Weeks   Status On-going     PT SHORT TERM GOAL #5   Title understand bladder irritants and how they affect the bladder   Time 4   Period Weeks   Status On-going           PT Long Term Goals - 12/02/15 1555      PT LONG TERM GOAL #1   Title independent with HEP    Time 8   Period Weeks   Status New     PT LONG TERM GOAL #2   Title fecal incontinence decreased >/= 75% due to increased anal sphinter control and strength   Time 8   Period Weeks   Status New     PT LONG TERM GOAL #3   Title urinary leakage decreased >/= 50% due to pelvic floor strength >/= 3/5 holding for 10 seconds   Time 8   Period Weeks   Status New     PT LONG TERM GOAL #4   Title bloating and pressure feeling in the abdominal region decreased >/= 50%    Time 8   Period Weeks   Status New     PT LONG TERM GOAL #5   Title straining to  have a bowel movement decreased >/= 50%   Time 8   Period Weeks   Status New               Plan - 12/22/15 1448    Clinical Impression Statement Patient had a colonoscopy showing diverticulitis.  Patient does not understand what fiber to eat and toileting technique.  Patient has not met goals due to just starting therapy.  Patient will benefit from skilled therapy to improve strength and muscle coordination.    Rehab Potential Excellent   Clinical Impairments Affecting Rehab Potential None   PT Frequency 1x / week   PT Duration 8 weeks   PT Next Visit Plan abdominal bracing, , stretches , diet irritants, Pelvic floor contraction   PT Home Exercise Plan stretches.    Consulted and Agree with Plan of Care Patient      Patient will benefit from skilled therapeutic intervention in order to improve the following deficits and impairments:  Decreased activity tolerance, Decreased endurance, Decreased strength, Decreased range of motion, Decreased coordination, Decreased mobility  Visit Diagnosis: Muscle weakness (generalized)  Unspecified lack of coordination     Problem List Patient Active Problem List   Diagnosis Date Noted  . Constipation 11/22/2015  . Diarrhea 10/08/2015  . Mass of right forearm 09/29/2015  . Spider varicose veins 07/22/2015  . Advanced care planning/counseling discussion 07/08/2015  . Cerumen impaction 07/08/2015  . Dysuria 07/02/2015  . Change in bowel habits 02/18/2015  . 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 Contact dermatitis due to plant 10/16/2014  . Chronic radicular low back pain 01/02/2014  . Atrophic vaginitis 09/26/2013  . Arthralgia 06/06/2013  . Pulmonary nodule, right 05/23/2013  . De Quervain's tenosynovitis, left 04/20/2013  . Osteopenia 07/26/2012  . INSOMNIA, CHRONIC 01/14/2010  . Diabetes mellitus, controlled ([place]) 11/11/2008  . MAMMOGRAM, ABNORMAL, LEFT 11/09/2008  . Chronic idiopathic constipation  09/11/2007  . OBESITY 05/31/2007  . ALLERGIC RHINITIS 05/31/2007  . GERD 05/31/2007  . OSTEOARTHRITIS 05/31/2007  . URINARY INCONTINENCE 05/31/2007  . High cholesterol 04/19/2007  . Essential hypertension, benign 04/19/2007  . ACTINIC KERATOSIS 02/01/2007    Earlie Counts, PT  12/22/15 2:51 PM    [facility] [address], [place], [place], [address] Phone: [phone]   Fax:  [phone]  Name: Janet Chapman MRN: [id number] Date of Birth: 09-28-1935

## 2015-12-22 NOTE — Patient Instructions (Addendum)
Introduction to Fiber Fiber Overview Dietary fiber is the part of plants that can't be digested. There are 2 kinds of dietary fiber: insoluble and soluble.  Insoluble fiber adds bulk to keep foods moving through the digestive system. Soluble fiber holds water, which softens the stool for easy bowel movements. Fiber is an important part of your diet, even though it passes through your body undigested and has no nutritional value. A high fiber diet can:  . promote regular bowel movements  . treat diverticular disease (inflammation of part of the intestine) and irritable bowel syndrome (abdominal pain, diarrhea, and constipation that come and go) . promote improvement in hemorrhoids, constipation and fecal incontinence  You should have at least 14 grams of fiber for every 1000 calories you eat every day. Read the label on every food package to find out how much fiber a serving of the food will provide. Foods containing more than 20% of the daily value of fiber per serving are considered high in fiber.  Without enough fiber in your diet, you may suffer from:  . constipation  . small, hard, dry bowel movements.   Sources of Fiber Breads, cereals, and pasta made with whole grain flour, and brown rice are high fiber foods. Many breakfast cereals list the bran or fiber content, so it's easy to know which products are high in fiber.  All fruits and vegetables also contain fiber. Dried beans, leafy vegetables, peas, raisins, prunes, apples, and citrus fruits are all especially good sources of fiber. Ask for examples of high-fiber foods (the fiber table and types of fiber handouts) for more resources on fiber.  Additional information on fiber content in foods, is available at www.caloriecounts.com  Types of Fiber  There are two main types of fiber:  insoluble and soluble.  Both of these types can prevent and relieve constipation and diarrhea, although some people find one or the other to be more easily  digested.  This handout details information about both types of fiber.  Insoluble Fiber       Functions of Insoluble Fiber . moves bulk through the intestines  . controls and balances the pH (acidity) in the intestines       Benefits of Insoluble Fiber . promotes regular bowel movement and prevents constipation  . removes fecal waste through colon in less time  . keeps an optimal pH in intestines to prevent microbes from producing cancer substances, therefore preventing colon cancer        Food Sources of Insoluble Fiber . whole-wheat products  . wheat bran "miller's bran" . corn bran  . flax seed or other seeds . vegetables such as green beans, broccoli, cauliflower and potato skins  . fruit skins and root vegetable skins  . popcorn . brown rice  Soluble Fiber       Functions of Soluble Fiber  . holds water in the colon to bulk and soften the stool . prolongs stomach emptying time so that sugar is released and absorbed more slowly        Benefits of Soluble Fiber . lowers total cholesterol and LDL cholesterol (the bad cholesterol) therefore reducing the risk of heart disease  . regulates blood sugar for people with diabetes       Food Sources of Soluble Fiber . oat/oat bran . dried beans and peas  . nuts  . barley  . flax seed or other seeds . fruits such as oranges, pears, peaches, and apples  . vegetables such as carrots  .  psyllium husk  . Prunes   Adding Fiber to Your Diet Eating foods that are high in fiber can help relieve some problems with constipation, hemorrhoids, diverticulitis and irritable bowel syndrome. A high-fiber diet can provide long-term health benefits.  Here are some tips to help you easily add fibers to your diet.  Start Slowly Many people notice bloating, cramping or gas when they add fiber to their diet. Making small changes in your diet over a period of time can help prevent this. Start with one of the changes listed below, then wait several  days to a week before making another. If one change doesn't seem to work for you, try a different one. You may have some gas or bloating at first, but your body will adjust in time. It's important to drink more fluids when you increase the amount of fiber you eat. If you don't already drink over 6 glasses of liquid a day, drink at least 2 more glasses of water a day when you increase your fiber intake. Tips for Increasing Fiber . Start your day with a high-fiber breakfast cereal. Check labels on the packages for the amounts of dietary fiber in each brand.  Eat at least 5 servings of fruits and vegetables each day. Fruits and vegetables that are high in fiber include: Apples Oranges Broccoli Cauliflower Berries Pears Brussels sprouts Green peas Figs Prunes Carrots Beans . Include fruits or vegetables with every meal. Use carrot sticks or apple slices for snacks.  . Cooked fiber is just as effective as raw fiber, so incorporate high-fiber foods in your cooking. . When preparing food leave edible skins and seeds, and use whole-grain flours. . Serve fruit-based desserts.  . Replace white bread with whole-grain breads and cereals. Eat brown rice instead of white rice.  . Eat more of the following foods: Bran muffins Brown rice Oatmeal Popcorn Multiple-grain cereals, cooked or dry 100% whole-wheat bread  . Add whole grains and dried beans to casseroles.  . Add 1/4 cup of wheat bran (miller's bran) to foods such as cooked cereal or applesauce or meat loaf. If you still suffer from constipation, talk to your health care provider about fiber laxatives. Psyllium is a soluble fiber that is often used for this purpose. It can be taken in tablet form or as a powder that is mixed in a glass of water. Always read and follow the directions on the label carefully.  Talk to your doctor or pharmacist if you have any concerns about fiber.  Fiber Table - Grams of Fiber in Food  For additional information on fiber  content in foods, go to www.caloriecounts.com  Food Products Serving Size Grams of Fiber/serving  Breads    Whole Wheat 1 slice [id number]  White 1 slice 0.5  Rye 1 slice [id number]  Cereals    Oat Bran 1 oz. 4.06  Wheat Bran 1 oz. 10.0  All Bran  cup 6.0  Optimum 1 cup 10.0  Whole Wheat Total 1 cup 3.0  Fiber One   cup 13.0  Shredded Wheat 1oz. 2.64  Corn Flakes 1 oz. 0.45  Cheerio's 1 1/3 cup 2.0  Oatmeal 1 oz. 2.5  Rice    Brown  cup 5.27  White  cup 1.42  Spaghetti 2 oz. 2.56  Vegetables (cooked)    Broccoli  cup 2.58  Brussels sprouts  cup 2.0  Cauliflower  cup 2.6  Carrots  cup 3.2  Corn  cup 3.03  Eggplant  cup 0.96  Green  peas  cup 3.36  Lettuce (raw)  cup 0.24  Baked potato w/skin  cup 2.97  Spinach  cup 2.07  Squash  cup 2.87  Tomato (raw)  cup 1.17  Zucchini  cup 1.26  Beans    Green (canned)  cup 1.89  Kidney  cup 5.48  Lima  cup 4.25  Pinto  cup 5.93  Fresh fruits    Apple (with peel) 1 medium 2.76  Apricots 1 cup 3.13  Banana  1 medium 2.19  Black/Boysenberries 1 cup 7.2  Grapefruit 1 medium 3.61  Grapes 1 cup 1.12  Nectarine 1 medium 2.2  Orange 1 medium 3.14  Pear (with peel) 1 medium 4.32  Prunes 3 3.5  Raspberries 1 cup 7.5  Strawberries 1 cup [id number]  Watermelon 1 slice [id number]     Foods to avoid is nuts, seed, and popcorn  Sitting    Sit comfortably. Allow body's muscles to relax. Place hands on belly. Inhale slowly and deeply for _5__ seconds, so hands move out. Then take _5__ seconds to exhale. Repeat __10_ times. Do _1-2__ times a day.  Copyright  VHI. All rights reserved.  Toileting Techniques for Bowel Movements (Defecation) Using your belly (abdomen) and pelvic floor muscles to have a bowel movement is usually instinctive.  Sometimes people can have problems with these muscles and have to relearn proper defecation (emptying) techniques.  If you have weakness in your muscles, organs that are falling out, decreased  sensation in your pelvis, or ignore your urge to go, you may find yourself straining to have a bowel movement.  You are straining if you are: . holding your breath or taking in a huge gulp of air and holding it  . keeping your lips and jaw tensed and closed tightly . turning red in the face because of excessive pushing or forcing . developing or worsening your  hemorrhoids . getting faint while pushing . not emptying completely and have to defecate many times a day  If you are straining, you are actually making it harder for yourself to have a bowel movement.  Many people find they are pulling up with the pelvic floor muscles and closing off instead of opening the anus. Due to lack pelvic floor relaxation and coordination the abdominal muscles, one has to work harder to push the feces out.  Many people have never been taught how to defecate efficiently and effectively.  Notice what happens to your body when you are having a bowel movement.  While you are sitting on the toilet pay attention to the following areas: . Jaw and mouth position . Angle of your hips   . Whether your feet touch the ground or not . Arm placement  . Spine position . Waist . Belly tension . Anus (opening of the anal canal)  An Evacuation/Defecation Plan   Here are the 4 basic points:  1. Lean forward enough for your elbows to rest on your knees 2. Support your feet on the floor or use a low stool if your feet don't touch the floor  3. Push out your belly as if you have swallowed a beach ball-you should feel a widening of your waist 4. Open and relax your pelvic floor muscles, rather than tightening around the anus      The following conditions my require modifications to your toileting posture:  . If you have had surgery in the past that limits your back, hip, pelvic, knee or ankle flexibility . Constipation  Your healthcare practitioner may make the following additional suggestions and  adjustments:  1) Sit on the toilet  a) Make sure your feet are supported. b) Notice your hip angle and spine position-most people find it effective to lean forward or raise their knees, which can help the muscles around the anus to relax  c) When you lean forward, place your forearms on your thighs for support  2) Relax suggestions a) Breath deeply in through your nose and out slowly through your mouth as if you are smelling the flowers and blowing out the candles. b) To become aware of how to relax your muscles, contracting and releasing muscles can be helpful.  Pull your pelvic floor muscles in tightly by using the image of holding back gas, or closing around the anus (visualize making a circle smaller) and lifting the anus up and in.  Then release the muscles and your anus should drop down and feel open. Repeat 5 times ending with the feeling of relaxation. c) Keep your pelvic floor muscles relaxed; let your belly bulge out. d) The digestive tract starts at the mouth and ends at the anal opening, so be sure to relax both ends of the tube.  Place your tongue on the roof of your mouth with your teeth separated.  This helps relax your mouth and will help to relax the anus at the same time.  3) Empty (defecation) a) Keep your pelvic floor and sphincter relaxed, then bulge your anal muscles.  Make the anal opening wide.  b) Stick your belly out as if you have swallowed a beach ball. c) Make your belly wall hard using your belly muscles while continuing to breathe. Doing this makes it easier to open your anus. d) Breath out and give a grunt (or try using other sounds such as ahhhh, shhhhh, ohhhh or grrrrrrr).  4) Finish a) As you finish your bowel movement, pull the pelvic floor muscles up and in.  This will leave your anus in the proper place rather than remaining pushed out and down. If you leave your anus pushed out and down, it will start to feel as though that is normal and give you incorrect  signals about needing to have a bowel movement.    Janet Chapman, PT [facility] [address] [place] [address] [place], [place] [address]

## 2015-12-23 ENCOUNTER — Other Ambulatory Visit: Payer: Self-pay

## 2015-12-23 ENCOUNTER — Telehealth: Payer: Self-pay | Admitting: Family Medicine

## 2015-12-23 MED ORDER — DILTIAZEM HCL 120 MG PO TABS: 120.0000 mg | ORAL_TABLET | Freq: Every day | ORAL | 1 refills | Status: DC

## 2015-12-23 NOTE — Telephone Encounter (Signed)
Pt called, [employer] is denying the MRI that we scheduled for her on 10/05/15.  After checking with [facility] and speaking with Sonia Baller, GI is appealing to [facility] because we had authorized the MRI for DOS 10/05/15, GI / Pt changed to 10/04/15, but was still within the authorization dates.  Informed pt that GI had put a hold on the acct and would not call her until insurance had responded to their appeal. This process could take up to 90 days.  Pt expressed understanding.

## 2015-12-23 NOTE — Telephone Encounter (Signed)
Rx sent electronically.  

## 2016-01-10 ENCOUNTER — Encounter: Payer: Self-pay | Admitting: Physical Therapy

## 2016-01-10 ENCOUNTER — Ambulatory Visit: Payer: Medicare Other | Admitting: Physical Therapy

## 2016-01-10 DIAGNOSIS — M6281 Muscle weakness (generalized): Secondary | ICD-10-CM

## 2016-01-10 DIAGNOSIS — R279 Unspecified lack of coordination: Secondary | ICD-10-CM

## 2016-01-10 NOTE — Patient Instructions (Addendum)
Certain foods and liquids will decrease the pH making the urine more acidic.  Urinary urgency increases when the urine has a low pH.  Most common irritants: alcohol, carbonated beverages and caffinated beverages.  Foods to avoid: apple juice, apples, ascorbic acid, canteloupes, chili, citrus fruits, coffee, cranberries, grapes, guava, peaches, pepper, pineapple, plums, strawberries, tea, tomatoes, and vinegar.  Drinking plenty of water may help to increase the pH and dilute out any of the effects of specific irritants.  Foods that are NOT irritating to the bladder include: Pears, papayas, sun-brewed teas, watermelons, non-citrus herbal teas, apricots, kava and low-acid instant drinks (Postum)    While sitting in a chair and an elastic band wrapped around your knees, move both knees to the sides to separate your legs while contracting the pelvic floor. Keep contact of your feet on the floor the entire time. 20 times    Adduction: Hip - Knees Together (Sitting)    Sit with towel roll between knees. Push knees together. Hold for _5__ seconds. Rest for _5__ seconds. Repeat _20__ times. Do __1_ times a day.  Copyright  VHI. All rights reserved.  Chair Sitting    Sit at edge of seat, spine straight, one leg extended. Put a hand on each thigh and bend forward from the hip, keeping spine straight. Allow hand on extended leg to reach toward toes. Support upper body with other arm. Hold _30__ seconds. Repeat _2__ times per session. Do _1__ sessions per day.  Copyright  VHI. All rights reserved.  Piriformis Stretch, Sitting    Sit, one ankle on opposite knee, same-side hand on crossed knee. Push down on knee, keeping spine straight. Lean torso forward, with flat back, until tension is felt in hamstrings and gluteals of crossed-leg side. Hold _30__ seconds.  Repeat __2_ times per session. Do _2__ sessions per day.  Copyright  VHI. All rights reserved.   Supine Knee-to-Chest,  Unilateral    Lie on back, hands clasped behind one knee. Pull knee in toward chest until a comfortable stretch is felt in lower back and buttocks. Hold _30__ seconds.  Repeat _2__ times per session. Do _1__ sessions per day.  Copyright  VHI. All rights reserved.  [facility] [address] [place], [place] [address] Phone # [phone] Fax [phone]

## 2016-01-10 NOTE — Therapy (Signed)
[facility] [address] [place], [place], [address] Phone: [phone]   Fax:  [phone]  Physical Therapy Treatment  Patient Details  Name: Janet Chapman MRN: [id number] Date of Birth: 10/15/1935 Referring Provider: dr. Harl Bowie  Encounter Date: 01/10/2016      PT End of Session - 01/10/16 1615    Visit Number 3   Number of Visits 10   Date for PT Re-Evaluation 01/27/16   Authorization Type medicare; g-code 10th visit   PT Start Time 1530   PT Stop Time 1615   PT Time Calculation (min) 45 min   Activity Tolerance Patient tolerated treatment well   Behavior During Therapy [facility] for tasks assessed/performed      Past Medical History:  Diagnosis Date  . Allergic rhinitis   . Diabetes mellitus without complication (HCC)    diet controlled  . GERD (gastroesophageal reflux disease)   . Hyperlipidemia   . Hypertension   . Osteoarthritis   . Urinary incontinence     Past Surgical History:  Procedure Laterality Date  . ABDOMINAL HYSTERECTOMY  1993   TA  . CARDIOVASCULAR STRESS TEST  2004   H. Smith, cardiolite-normal  . CHOLECYSTECTOMY  [phone]  . COLONOSCOPY      There were no vitals filed for this visit.      Subjective Assessment - 01/10/16 1535    Subjective I have increased my fiber.  I am feeling better. When I need to have a bowel movement I get a pain in the lower right abdominal and back. I have been eating prunes that is cleaning me out.    Patient Stated Goals Reduce fecal incontinence   Currently in Pain? Yes   Pain Score 5    Pain Location Abdomen   Pain Orientation Right;Lower   Pain Descriptors / Indicators Pressure   Pain Type Chronic pain   Pain Onset More than a month ago   Pain Frequency Intermittent   Aggravating Factors  gas   Pain Relieving Factors have a bowel movement   Multiple Pain Sites No                         [facility] Adult PT  Treatment/Exercise - 01/10/16 0001      Self-Care   Self-Care Other Self-Care Comments   Other Self-Care Comments  Discussed what colonic diverticuloic  is , how fiber helps , what diverticula, not eating nuts and seeds; prunes are okay                  PT Short Term Goals - 01/10/16 1549      PT SHORT TERM GOAL #1   Title independent with initial HEP   Time 4   Period Weeks   Status Achieved     PT SHORT TERM GOAL #2   Title fecal incontinence decresaed >/= 25%   Period Weeks   Status Achieved     PT SHORT TERM GOAL #3   Title urinary incontinence decreased >/= 25%   Time 4   Period Weeks   Status Achieved     PT SHORT TERM GOAL #4   Title ability to brace abdominals with pelvic floor contraction    Time 4   Period Weeks   Status Achieved     PT SHORT TERM GOAL #5   Title understand bladder irritants and how they affect the bladder   Time 4   Period  Weeks   Status Achieved           PT Long Term Goals - 12/02/15 1555      PT LONG TERM GOAL #1   Title independent with HEP    Time 8   Period Weeks   Status New     PT LONG TERM GOAL #2   Title fecal incontinence decreased >/= 75% due to increased anal sphinter control and strength   Time 8   Period Weeks   Status New     PT LONG TERM GOAL #3   Title urinary leakage decreased >/= 50% due to pelvic floor strength >/= 3/5 holding for 10 seconds   Time 8   Period Weeks   Status New     PT LONG TERM GOAL #4   Title bloating and pressure feeling in the abdominal region decreased >/= 50%    Time 8   Period Weeks   Status New     PT LONG TERM GOAL #5   Title straining to have a bowel movement decreased >/= 50%   Time 8   Period Weeks   Status New               Plan - 01/10/16 1616    Clinical Impression Statement Patient has me all of her STG's.  Patient still has questions on what diverticulitis is and not understanding it.  Patient urinary and fecal incontinence decreased by 25%.   Patient understand correct toileting techniques.  Patient is able to do exercises correctly. Patient will benefit from skilled therapy to increase strength and improve pelvic floor coordination.    Rehab Potential Excellent   Clinical Impairments Affecting Rehab Potential None   PT Frequency 1x / week   PT Duration 8 weeks   PT Treatment/Interventions Biofeedback;Therapeutic activities;Therapeutic exercise;Patient/family education;Neuromuscular re-education;Manual techniques;Passive range of motion;ADLs/Self Care Home Management   PT Next Visit Plan abdominal bracing,see if patient is ready for discharge   PT Home Exercise Plan progress as needed   Consulted and Agree with Plan of Care Patient      Patient will benefit from skilled therapeutic intervention in order to improve the following deficits and impairments:  Decreased activity tolerance, Decreased endurance, Decreased strength, Decreased range of motion, Decreased coordination, Decreased mobility  Visit Diagnosis: Muscle weakness (generalized)  Unspecified lack of coordination     Problem List Patient Active Problem List   Diagnosis Date Noted  . Constipation 11/22/2015  . Diarrhea 10/08/2015  . Mass of right forearm 09/29/2015  . Spider varicose veins 07/22/2015  . Advanced care planning/counseling discussion 07/08/2015  . Cerumen impaction 07/08/2015  . Dysuria 07/02/2015  . Change in bowel habits 02/18/2015  . 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 Contact dermatitis due to plant 10/16/2014  . Chronic radicular low back pain 01/02/2014  . Atrophic vaginitis 09/26/2013  . Arthralgia 06/06/2013  . Pulmonary nodule, right 05/23/2013  . De Quervain's tenosynovitis, left 04/20/2013  . Osteopenia 07/26/2012  . INSOMNIA, CHRONIC 01/14/2010  . Diabetes mellitus, controlled ([place]) 11/11/2008  . MAMMOGRAM, ABNORMAL, LEFT 11/09/2008  . Chronic idiopathic constipation 09/11/2007  . OBESITY  05/31/2007  . ALLERGIC RHINITIS 05/31/2007  . GERD 05/31/2007  . OSTEOARTHRITIS 05/31/2007  . URINARY INCONTINENCE 05/31/2007  . High cholesterol 04/19/2007  . Essential hypertension, benign 04/19/2007  . ACTINIC KERATOSIS 02/01/2007    Earlie Counts, PT 01/10/16 4:20 PM   [facility] [address]  [place], [place], [address] Phone: [phone]   Fax:  [phone]  Name: SHENEEKA MIRO MRN: [id number] Date of Birth: 15-Mar-1936

## 2016-01-24 ENCOUNTER — Encounter: Payer: Self-pay | Admitting: Physical Therapy

## 2016-01-24 ENCOUNTER — Ambulatory Visit: Payer: Medicare Other | Attending: Gastroenterology | Admitting: Physical Therapy

## 2016-01-24 DIAGNOSIS — M6281 Muscle weakness (generalized): Secondary | ICD-10-CM | POA: Insufficient documentation

## 2016-01-24 DIAGNOSIS — R279 Unspecified lack of coordination: Secondary | ICD-10-CM | POA: Diagnosis present

## 2016-01-24 NOTE — Therapy (Signed)
[facility] [address], [place], [place], [address] Phone: [phone]   Fax:  [phone]  Physical Therapy Treatment  Patient Details  Name: Janet Chapman MRN: [id number] Date of Birth: 28-Apr-1936 Referring Provider: Dr. Harl Bowie  Encounter Date: 01/24/2016      PT End of Session - 01/24/16 1053    Visit Number 4   Date for PT Re-Evaluation 01/27/16   Authorization Type medicare; g-code 10th visit   PT Start Time 1015   PT Stop Time 1055   PT Time Calculation (min) 40 min   Activity Tolerance Patient tolerated treatment well   Behavior During Therapy [facility] for tasks assessed/performed      Past Medical History:  Diagnosis Date  . Allergic rhinitis   . Diabetes mellitus without complication (HCC)    diet controlled  . GERD (gastroesophageal reflux disease)   . Hyperlipidemia   . Hypertension   . Osteoarthritis   . Urinary incontinence     Past Surgical History:  Procedure Laterality Date  . ABDOMINAL HYSTERECTOMY  1993   TA  . CARDIOVASCULAR STRESS TEST  2004   H. Smith, cardiolite-normal  . CHOLECYSTECTOMY  [phone]  . COLONOSCOPY      There were no vitals filed for this visit.      Subjective Assessment - 01/24/16 1021    Subjective I am doing better.  I am eating prunes.  I am going 2-3 times to the bathroom. I feel alot better.    Patient Stated Goals Reduce fecal incontinence   Currently in Pain? No/denies            [facility] PT Assessment - 01/24/16 0001      Assessment   Medical Diagnosis K59.02 Constipation, outlet dysfunction; R15.9 Fecal incontinence   Referring Provider Dr. Harl Bowie   Onset Date/Surgical Date 03/29/16   Prior Therapy None     Precautions   Precautions None     Restrictions   Weight Bearing Restrictions No     Balance Screen   Has the patient fallen in the past 6 months No   Has the patient had a decrease in activity level because of a  fear of falling?  No   Is the patient reluctant to leave their home because of a fear of falling?  No     Home Ecologist residence     Prior Function   Level of Independence Independent   Vocation Retired     Associate Professor   Overall Cognitive Status Within Functional Limits for tasks assessed     Observation/Other Assessments   Focus on Therapeutic Outcomes (FOTO)  37% limitation     AROM   Lumbar Extension full   Lumbar - Right Side Bend full     PROM   Right Hip External Rotation  40   Left Hip External Rotation  35     Strength   Overall Strength Comments abdominal strength is 3/5     Palpation   SI assessment  pelvis in correct aignment                  Pelvic Floor Special Questions - 01/24/16 0001    Urinary Leakage Yes   Pad use 1   Urinary urgency Yes   Fecal incontinence No           [facility] Adult PT Treatment/Exercise - 01/24/16 0001      Self-Care   Self-Care  Other Self-Care Comments   Other Self-Care Comments  reviewed diet and how it affects the bowels, adding fiber into diet, eating prunes     Exercises   Exercises Other Exercises   Other Exercises  sit with ball squeeze 10x hold 5 sec; hip abduction sitting with red band 20x; piriformis stretch; hamstring stretch;                 PT Education - 01/24/16 1053    Education provided Yes   Education Details reviewed HEP    Methods Explanation;Demonstration   Comprehension Verbalized understanding;Returned demonstration          PT Short Term Goals - 01/10/16 1549      PT SHORT TERM GOAL #1   Title independent with initial HEP   Time 4   Period Weeks   Status Achieved     PT SHORT TERM GOAL #2   Title fecal incontinence decresaed >/= 25%   Period Weeks   Status Achieved     PT SHORT TERM GOAL #3   Title urinary incontinence decreased >/= 25%   Time 4   Period Weeks   Status Achieved     PT SHORT TERM GOAL #4   Title ability to  brace abdominals with pelvic floor contraction    Time 4   Period Weeks   Status Achieved     PT SHORT TERM GOAL #5   Title understand bladder irritants and how they affect the bladder   Time 4   Period Weeks   Status Achieved           PT Long Term Goals - 01/24/16 1031      PT LONG TERM GOAL #1   Title independent with HEP    Time 8   Period Weeks   Status Achieved     PT LONG TERM GOAL #2   Title fecal incontinence decreased >/= 75% due to increased anal sphinter control and strength   Time 8   Period Weeks   Status Achieved     PT LONG TERM GOAL #3   Title urinary leakage decreased >/= 50% due to pelvic floor strength >/= 3/5 holding for 10 seconds   Time 8   Period Weeks   Status Achieved     PT LONG TERM GOAL #4   Title bloating and pressure feeling in the abdominal region decreased >/= 50%    Time 8   Period Weeks   Status Achieved  75% better     PT LONG TERM GOAL #5   Title straining to have a bowel movement decreased >/= 50%   Time 8   Period Weeks   Status Achieved               Plan - 01/24/16 1033    Clinical Impression Statement Patient has met all of her LTG's.  Patient urrinary incontinence decreased by 50%. Strain to have a bowel movement has decresaed 75%.  Patient is eating prunes that helps. Bloating and pressure improved by 80%.  Patient wears a pad  for just in case.  Patient has full lumbar ROM.    Rehab Potential Excellent   Clinical Impairments Affecting Rehab Potential None   PT Treatment/Interventions Biofeedback;Therapeutic activities;Therapeutic exercise;Patient/family education;Neuromuscular re-education;Manual techniques;Passive range of motion;ADLs/Self Care Home Management   PT Next Visit Plan Discharge today to HEP   PT Home Exercise Plan Current HEP   Consulted and Agree with Plan of Care Patient  Patient will benefit from skilled therapeutic intervention in order to improve the following deficits and  impairments:  Decreased activity tolerance, Decreased endurance, Decreased strength, Decreased range of motion, Decreased coordination, Decreased mobility  Visit Diagnosis: Muscle weakness (generalized)  Unspecified lack of coordination       G-Codes - February 15, 2016 1046    Functional Assessment Tool Used FOTO score is 37 limitation for bowel cnst survey   Functional Limitation Other PT primary   Other PT Primary Goal Status (T0160) At least 40 percent but less than 60 percent impaired, limited or restricted   Other PT Primary Discharge Status [phone]) At least 20 percent but less than 40 percent impaired, limited or restricted      Problem List Patient Active Problem List   Diagnosis Date Noted  . Constipation 11/22/2015  . Diarrhea 10/08/2015  . Mass of right forearm 09/29/2015  . Spider varicose veins 07/22/2015  . Advanced care planning/counseling discussion 07/08/2015  . Cerumen impaction 07/08/2015  . Dysuria 07/02/2015  . Change in bowel habits 02/18/2015  . 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 Contact dermatitis due to plant 10/16/2014  . Chronic radicular low back pain 01/02/2014  . Atrophic vaginitis 09/26/2013  . Arthralgia 06/06/2013  . Pulmonary nodule, right 05/23/2013  . De Quervain's tenosynovitis, left 04/20/2013  . Osteopenia 07/26/2012  . INSOMNIA, CHRONIC 01/14/2010  . Diabetes mellitus, controlled ([place]) 11/11/2008  . MAMMOGRAM, ABNORMAL, LEFT 11/09/2008  . Chronic idiopathic constipation 09/11/2007  . OBESITY 05/31/2007  . ALLERGIC RHINITIS 05/31/2007  . GERD 05/31/2007  . OSTEOARTHRITIS 05/31/2007  . URINARY INCONTINENCE 05/31/2007  . High cholesterol 04/19/2007  . Essential hypertension, benign 04/19/2007  . ACTINIC KERATOSIS 02/01/2007    Earlie Counts, PT February 15, 2016 10:56 AM   [facility] [address] [place], [place], [address] Phone: [phone]   Fax:   [phone]  Name: Janet Chapman MRN: [id number] Date of Birth: [birth year]  PHYSICAL THERAPY DISCHARGE SUMMARY  Visits from Start of Care: 4  Current functional level related to goals / functional outcomes: See above.   Remaining deficits: See above   Education / Equipment: HEP Plan: Patient agrees to discharge.  Patient goals were met. Patient is being discharged due to meeting the stated rehab goals.  Thank you for the referral. Earlie Counts, PT February 15, 2016 10:56 AM  ?????

## 2016-01-31 ENCOUNTER — Encounter: Payer: Medicare Other | Admitting: Physical Therapy

## 2016-02-03 ENCOUNTER — Ambulatory Visit (INDEPENDENT_AMBULATORY_CARE_PROVIDER_SITE_OTHER): Payer: Medicare Other | Admitting: Family Medicine

## 2016-02-03 ENCOUNTER — Encounter: Payer: Self-pay | Admitting: Family Medicine

## 2016-02-03 VITALS — BP 145/73 | HR 50 | Temp 98.3°F | Ht 62.25 in | Wt 155.8 lb

## 2016-02-03 DIAGNOSIS — R21 Rash and other nonspecific skin eruption: Secondary | ICD-10-CM

## 2016-02-03 DIAGNOSIS — Z23 Encounter for immunization: Secondary | ICD-10-CM

## 2016-02-03 DIAGNOSIS — L299 Pruritus, unspecified: Secondary | ICD-10-CM

## 2016-02-03 MED ORDER — KETOCONAZOLE 2 % EX SHAM: 1.0000 "application " | MEDICATED_SHAMPOO | CUTANEOUS | 0 refills | Status: DC

## 2016-02-03 NOTE — Patient Instructions (Signed)
For scalp itching: Start  Ketoconazole  Shampoo for possible seborrheic dermatitis.  If pain or blister on buttock.. Call immediately for consideration of shingles.  Start Cetaphil or Eucerin moisturizing CREAM for skin. If itching can use cortisone cream OTC ( COrtisone 10) twice daily as needed for itching in area affected.

## 2016-02-03 NOTE — Progress Notes (Signed)
Pre visit review using our clinic review tool, if applicable. No additional management support is needed unless otherwise documented below in the visit note. 

## 2016-02-03 NOTE — Assessment & Plan Note (Signed)
Be on look out for possibel early shingles.  ? Related to nerve compression from the way she is lying? ? Contact derm from salonpas.  Treat with moisturizing cream and OTC hydrocortisone.

## 2016-02-03 NOTE — Assessment & Plan Note (Signed)
?   seb derm.. Treat with ketoconazole shampoo. Avoid any possible cause of contact derm.

## 2016-02-03 NOTE — Progress Notes (Signed)
   Subjective:    Patient ID: Janet Chapman, female    DOB: Jun 13, 1935, 80 y.o.   MRN: [id number]  HPI  80 year old female with DM,HTN,presents with new onset itching in last week. On right buttock laterally. Sleeps on that side.  woke up at night with it, but it may have started before bedtime. Put Salonpas with it, helped some. Voltaren gel helped too. No rash. Does not bother if she is in recliner. No low back pain, no numbness. Did not happen last night.  Itching scalp in last few months, off and in last few years. No flaky skin.  Gets hair done weekly. ? If new wave set or shampoo.  No new meds. New B complex.    Review of Systems  Constitutional: Negative for fatigue and fever.  HENT: Negative for ear pain.   Eyes: Negative for pain.  Respiratory: Negative for chest tightness and shortness of breath.   Cardiovascular: Negative for chest pain, palpitations and leg swelling.  Gastrointestinal: Negative for abdominal pain.  Genitourinary: Negative for dysuria.       Objective:   Physical Exam  Constitutional: Vital signs are normal. She appears well-developed and well-nourished. She is cooperative.  Non-toxic appearance. She does not appear ill. No distress.  HENT:  Head: Normocephalic.  Right Ear: Hearing, tympanic membrane, external ear and ear canal normal. Tympanic membrane is not erythematous, not retracted and not bulging.  Left Ear: Hearing, tympanic membrane, external ear and ear canal normal. Tympanic membrane is not erythematous, not retracted and not bulging.  Nose: No mucosal edema or rhinorrhea. Right sinus exhibits no maxillary sinus tenderness and no frontal sinus tenderness. Left sinus exhibits no maxillary sinus tenderness and no frontal sinus tenderness.  Mouth/Throat: Uvula is midline, oropharynx is clear and moist and mucous membranes are normal.  Eyes: Conjunctivae, EOM and lids are normal. Pupils are equal, round, and reactive to light. Lids are  everted and swept, no foreign bodies found.  Neck: Trachea normal and normal range of motion. Neck supple. Carotid bruit is not present. No thyroid mass and no thyromegaly present.  Cardiovascular: Normal rate, regular rhythm, S1 normal, S2 normal, normal heart sounds, intact distal pulses and normal pulses.  Exam reveals no gallop and no friction rub.   No murmur heard. Pulmonary/Chest: Effort normal and breath sounds normal. No tachypnea. No respiratory distress. She has no decreased breath sounds. She has no wheezes. She has no rhonchi. She has no rales.  Abdominal: Soft. Normal appearance and bowel sounds are normal. There is no tenderness.  Neurological: She is alert.  Skin: Skin is warm, dry and intact. No rash noted.  Dry skin on scalp   no rash on right buttock except slightly red rectangle where salonpas was.  Psychiatric: Her speech is normal and behavior is normal. Judgment and thought content normal. Her mood appears not anxious. Cognition and memory are normal. She does not exhibit a depressed mood.          Assessment & Plan:

## 2016-03-01 ENCOUNTER — Ambulatory Visit (INDEPENDENT_AMBULATORY_CARE_PROVIDER_SITE_OTHER): Payer: Medicare Other | Admitting: Family Medicine

## 2016-03-01 ENCOUNTER — Encounter: Payer: Self-pay | Admitting: Family Medicine

## 2016-03-01 VITALS — BP 140/70 | HR 55 | Temp 98.4°F

## 2016-03-01 DIAGNOSIS — J069 Acute upper respiratory infection, unspecified: Secondary | ICD-10-CM

## 2016-03-01 MED ORDER — BENZONATATE 100 MG PO CAPS: 100.0000 mg | ORAL_CAPSULE | Freq: Three times a day (TID) | ORAL | 0 refills | Status: DC | PRN

## 2016-03-01 NOTE — Progress Notes (Signed)
Subjective:    Patient ID: Janet Chapman, female    DOB: Feb 11, 1936, 80 y.o.   MRN: [id number]  HPI This is a 80 yo female who presents today with cough and nasal congestion for 5 days. She has had laryngitis. No fever. Has a lot of drainage. Nasal drainage is clear, sputum is clear. A little chest tightness and SOB. Slept well last night. No myalgias. Head feels stuffy, no headache. No ear pain. Some sore throat. No dizziness, no lightheadedness. Has taken an over the counter allergy medicine (not sure of name) without improvement.  Her brother in law and husband have had recent colds. She is going to the [place] in two days with a friend and hopes to feel better by then.   Past Medical History:  Diagnosis Date  . Allergic rhinitis   . Diabetes mellitus without complication (HCC)    diet controlled  . GERD (gastroesophageal reflux disease)   . Hyperlipidemia   . Hypertension   . Osteoarthritis   . Urinary incontinence    Past Surgical History:  Procedure Laterality Date  . ABDOMINAL HYSTERECTOMY  1993   TA  . CARDIOVASCULAR STRESS TEST  2004   H. Smith, cardiolite-normal  . CHOLECYSTECTOMY  [phone]  . COLONOSCOPY     Family History  Problem Relation Age of Onset  . Heart attack Father 68  . Dementia Mother   . Stroke Mother 63  . Ovarian cancer Sister 75  . Diabetes      Maternal side  . Ovarian cancer      Grandmother  . Ovarian cancer      aunt  . Colon cancer Neg Hx    Social History  Substance Use Topics  . Smoking status: Never Smoker  . Smokeless tobacco: Never Used  . Alcohol use Yes     Comment: rarely      Review of Systems Per HPI    Objective:   Physical Exam  Constitutional: She is oriented to person, place, and time. She appears well-developed and well-nourished. No distress.  HENT:  Head: Normocephalic and atraumatic.  Right Ear: Tympanic membrane, external ear and ear canal normal.  Left Ear: Tympanic membrane, external ear and ear  canal normal.  Nose: Nose normal.  Moderate amount post nasal drainage.   Cardiovascular: Regular rhythm and normal heart sounds.  Bradycardia present.   History HR 50-60s.   Pulmonary/Chest: Effort normal and breath sounds normal. No respiratory distress. She has no wheezes. She has no rales.  Neurological: She is alert and oriented to person, place, and time.  Skin: Skin is warm and dry. She is not diaphoretic.  Psychiatric: She has a normal mood and affect. Her behavior is normal. Judgment and thought content normal.  Vitals reviewed.     BP 140/70   Pulse (!) 55   Temp 98.4 F (36.9 C)   SpO2 95%  Wt Readings from Last 3 Encounters:  02/03/16 155 lb 12 oz (70.6 kg)  12/09/15 157 lb (71.2 kg)  11/30/15 157 lb (71.2 kg)       Assessment & Plan:  1. URI with cough and congestion - suspect viral illness, Provided written and verbal information regarding diagnosis and treatment. - RTC precautions reviewed- fever/chills, wheeze, increased SOB, no improvement in 5-7 days - continue antihistamine, can add Afrin nasal spray for 3 days, rest, fluids - benzonatate (TESSALON) 100 MG capsule; Take 1-2 capsules (100-200 mg total) by mouth 3 (three) times daily as  needed for cough.  Dispense: 40 capsule; Refill: 0   Clarene Reamer, FNP-BC  [facility]  03/01/2016 11:07 AM

## 2016-03-01 NOTE — Patient Instructions (Signed)
I have sent in a prescription for cough pills to your pharmacy Please continue your allergy medicine (ceterizine) daily You can also use Afrin nasal spray two sprays in each nostril twice a day for up to three days  Upper Respiratory Infection, Adult Most upper respiratory infections (URIs) are a viral infection of the air passages leading to the lungs. A URI affects the nose, throat, and upper air passages. The most common type of URI is nasopharyngitis and is typically referred to as "the common cold." URIs run their course and usually go away on their own. Most of the time, a URI does not require medical attention, but sometimes a bacterial infection in the upper airways can follow a viral infection. This is called a secondary infection. Sinus and middle ear infections are common types of secondary upper respiratory infections. Bacterial pneumonia can also complicate a URI. A URI can worsen asthma and chronic obstructive pulmonary disease (COPD). Sometimes, these complications can require emergency medical care and may be life threatening.  CAUSES Almost all URIs are caused by viruses. A virus is a type of germ and can spread from one person to another.  RISKS FACTORS You may be at risk for a URI if:   You smoke.   You have chronic heart or lung disease.  You have a weakened defense (immune) system.   You are very young or very old.   You have nasal allergies or asthma.  You work in crowded or poorly ventilated areas.  You work in health care facilities or schools. SIGNS AND SYMPTOMS  Symptoms typically develop 2-3 days after you come in contact with a cold virus. Most viral URIs last 7-10 days. However, viral URIs from the influenza virus (flu virus) can last 14-18 days and are typically more severe. Symptoms may include:   Runny or stuffy (congested) nose.   Sneezing.   Cough.   Sore throat.   Headache.   Fatigue.   Fever.   Loss of appetite.   Pain in  your forehead, behind your eyes, and over your cheekbones (sinus pain).  Muscle aches.  DIAGNOSIS  Your health care provider may diagnose a URI by:  Physical exam.  Tests to check that your symptoms are not due to another condition such as:  Strep throat.  Sinusitis.  Pneumonia.  Asthma. TREATMENT  A URI goes away on its own with time. It cannot be cured with medicines, but medicines may be prescribed or recommended to relieve symptoms. Medicines may help:  Reduce your fever.  Reduce your cough.  Relieve nasal congestion. HOME CARE INSTRUCTIONS   Take medicines only as directed by your health care provider.   Gargle warm saltwater or take cough drops to comfort your throat as directed by your health care provider.  Use a warm mist humidifier or inhale steam from a shower to increase air moisture. This may make it easier to breathe.  Drink enough fluid to keep your urine clear or pale yellow.   Eat soups and other clear broths and maintain good nutrition.   Rest as needed.   Return to work when your temperature has returned to normal or as your health care provider advises. You may need to stay home longer to avoid infecting others. You can also use a face mask and careful hand washing to prevent spread of the virus.  Increase the usage of your inhaler if you have asthma.   Do not use any tobacco products, including cigarettes, chewing tobacco, or  electronic cigarettes. If you need help quitting, ask your health care provider. PREVENTION  The best way to protect yourself from getting a cold is to practice good hygiene.   Avoid oral or hand contact with people with cold symptoms.   Wash your hands often if contact occurs.  There is no clear evidence that vitamin C, vitamin E, echinacea, or exercise reduces the chance of developing a cold. However, it is always recommended to get plenty of rest, exercise, and practice good nutrition.  SEEK MEDICAL CARE IF:    You are getting worse rather than better.   Your symptoms are not controlled by medicine.   You have chills.  You have worsening shortness of breath.  You have brown or red mucus.  You have yellow or brown nasal discharge.  You have pain in your face, especially when you bend forward.  You have a fever.  You have swollen neck glands.  You have pain while swallowing.  You have white areas in the back of your throat. SEEK IMMEDIATE MEDICAL CARE IF:   You have severe or persistent:  Headache.  Ear pain.  Sinus pain.  Chest pain.  You have chronic lung disease and any of the following:  Wheezing.  Prolonged cough.  Coughing up blood.  A change in your usual mucus.  You have a stiff neck.  You have changes in your:  Vision.  Hearing.  Thinking.  Mood. MAKE SURE YOU:   Understand these instructions.  Will watch your condition.  Will get help right away if you are not doing well or get worse.   This information is not intended to replace advice given to you by your health care provider. Make sure you discuss any questions you have with your health care provider.   Document Released: 10/25/2000 Document Revised: 09/15/2014 Document Reviewed: 08/06/2013 Elsevier Interactive Patient Education [employer].

## 2016-03-02 ENCOUNTER — Telehealth: Payer: Self-pay | Admitting: Family Medicine

## 2016-03-02 ENCOUNTER — Other Ambulatory Visit (INDEPENDENT_AMBULATORY_CARE_PROVIDER_SITE_OTHER): Payer: Medicare Other

## 2016-03-02 DIAGNOSIS — E119 Type 2 diabetes mellitus without complications: Secondary | ICD-10-CM

## 2016-03-02 LAB — COMPREHENSIVE METABOLIC PANEL
ALT: 14 U/L (ref 0–35)
AST: 17 U/L (ref 0–37)
Albumin: 4.5 g/dL (ref 3.5–5.2)
Alkaline Phosphatase: 71 U/L (ref 39–117)
BUN: 14 mg/dL (ref 6–23)
CO2: 37 mEq/L — ABNORMAL HIGH (ref 19–32)
Calcium: 10.5 mg/dL (ref 8.4–10.5)
Chloride: 96 mEq/L (ref 96–112)
Creatinine, Ser: 0.86 mg/dL (ref 0.40–1.20)
GFR: 67.79 mL/min (ref >60.00)
Glucose, Bld: 119 mg/dL — ABNORMAL HIGH (ref 70–99)
Potassium: 4.5 mEq/L (ref 3.5–5.1)
Sodium: 141 mEq/L (ref 135–145)
Total Bilirubin: 0.6 mg/dL (ref 0.2–1.2)
Total Protein: 7 g/dL (ref 6.0–8.3)

## 2016-03-02 LAB — LIPID PANEL
Cholesterol: 173 mg/dL (ref 0–200)
HDL: 71.4 mg/dL (ref >39.00)
LDL Cholesterol: 91 mg/dL (ref 0–99)
NonHDL: 101.83
Total CHOL/HDL Ratio: 2
Triglycerides: 56 mg/dL (ref 0.0–149.0)
VLDL: 11.2 mg/dL (ref 0.0–40.0)

## 2016-03-02 LAB — HEMOGLOBIN A1C: Hgb A1c MFr Bld: 6.3 % (ref 4.6–6.5)

## 2016-03-02 NOTE — Telephone Encounter (Signed)
-----   Message from Ellamae Sia sent at 02/24/2016 11:54 AM EDT ----- Regarding: Lab orders for Thursday, 10.19.17 Lab orders for a f/u appt

## 2016-03-03 ENCOUNTER — Telehealth: Payer: Self-pay

## 2016-03-03 NOTE — Telephone Encounter (Signed)
Pt left v/m; pt was seen 03/01/16; pt has been taking meds as prescribed for allergies and coughing and pt is not any better and thinks needs different med. Pt request cb. [facility].

## 2016-03-03 NOTE — Telephone Encounter (Signed)
Called and spoke with patient. Cough seems to be clearing and she is having more watery nasal drainage. Feels a little better this afternoon, will continue current symptomatic treatment and let us know if she gets worse.

## 2016-03-07 ENCOUNTER — Ambulatory Visit (INDEPENDENT_AMBULATORY_CARE_PROVIDER_SITE_OTHER): Payer: Medicare Other | Admitting: Family Medicine

## 2016-03-07 ENCOUNTER — Encounter: Payer: Self-pay | Admitting: Family Medicine

## 2016-03-07 VITALS — BP 114/61 | HR 58 | Temp 97.7°F | Ht 62.25 in | Wt 158.8 lb

## 2016-03-07 DIAGNOSIS — I10 Essential (primary) hypertension: Secondary | ICD-10-CM | POA: Diagnosis not present

## 2016-03-07 DIAGNOSIS — E7800 Pure hypercholesterolemia, unspecified: Secondary | ICD-10-CM | POA: Diagnosis not present

## 2016-03-07 DIAGNOSIS — E119 Type 2 diabetes mellitus without complications: Secondary | ICD-10-CM

## 2016-03-07 DIAGNOSIS — B353 Tinea pedis: Secondary | ICD-10-CM

## 2016-03-07 NOTE — Progress Notes (Signed)
80 year old female presents for DM follow up 6 months.   At last OV 9/21: Treated for seb derm of scalp.Marland Kitchen Ketoconazole shampoo  Itchy skin: rec moisturizing and OTC hydrocortisone cream. Stop OTC supplements. Today she reports  Improved symptoms in both areas.  Seen on 10/18 for viral URI.Marland Kitchen X 14 days.  Improving overall, 50% improvement butstill coughing a lot at night. Using benzonatate  or cheratussin AC for cough. Mild SOB due to mucus. No wheeze. Productive cough, white. Using husbands mucinex. Using allergy med as well.  Not sleeping well overall. Tired.   [id number] controlled. Does not check blood sugar at home.  [id number] Lab Results  Component Value Date   HGBA1C 6.3 03/02/2016  Exercise: None   no eye exam in last year.  High cholesterol: good control on simvastatin 40 mg daily.  LDL at goal < 100 Lab Results  Component Value Date   CHOL 173 03/02/2016   HDL 71.40 03/02/2016   LDLCALC 91 03/02/2016   LDLDIRECT 119.8 04/26/2009   TRIG 56.0 03/02/2016   CHOLHDL 2 03/02/2016   HTN: Well controlled on diltiazem, losartan, HCTZ, metoprolol.  Saw Dr. Rockey Situ for palpitations in 2012... using metoprolol extra 50 mg rarely because pulse okay. Has not been back to see him.  No CP with exertion. mild SOB. No edema.  occ palpitations. Not checking BP at home. BP Readings from Last 3 Encounters:  03/01/16 140/70  02/03/16 (!) 145/73  12/09/15 (!) 110/51  Diet: Fruit and veggies, no FF, some water, artificial sweetener     Wt Readings from Last 3 Encounters:  03/07/16 158 lb 12 oz (72 kg)  02/03/16 155 lb 12 oz (70.6 kg)  12/09/15 157 lb (71.2 kg)   Body mass index is 28.8 kg/m.   Review of Systems  General: Denies fatigue and fever. No issues with sleep. CV: Denies chest pain or discomfort and swelling of feet.  Resp: Denies shortness of breath.  GI: Denies abdominal pain.  GU: Denies dysuria.   Physical Exam  General: Well-developed,well-nourished,in no  acute distress; alert,appropriate and cooperative throughout examination  Ears: External ear exam shows no significant lesions or deformities. Otoscopic examination reveals  tympanic membranes are intact bilaterally without bulging, retraction, inflammation or discharge. Hearing is grossly normal bilaterally.  Nose: External nasal examination shows no deformity or inflammation. Nasal mucosa are pink and moist without lesions or exudates.  Mouth: Oral mucosa and oropharynx without lesions or exudates. Teeth in good repair.  Neck: no carotid bruit or thyromegaly no cervical or supraclavicular lymphadenopathy  Chest Wall: No deformities, masses, or tenderness noted.  Breasts: No mass, nodules, thickening, tenderness, bulging, retraction, inflamation, nipple discharge or skin changes noted.  Lungs: Normal respiratory effort, chest expands symmetrically. Lungs are clear to auscultation, no crackles or wheezes.  Heart: Normal rate and regular rhythm. S1 and S2 normal without gallop, murmur, click, rub or other extra sounds.  Abdomen: Bowel sounds positive,abdomen soft and non-tender without masses, organomegaly or hernias noted.  Genitalia: not indicated  Pulses: R and L posterior tibial pulses are full and equal bilaterally  Extremities: no edema  Neurologic: No cranial nerve deficits noted. Station and gait are normal. Plantar reflexes are down-going bilaterally. DTRs are symmetrical throughout. Sensory, motor and coordinative functions appear intact.  Skin: Intact without suspicious lesions or rashes  Psych: Cognition and judgment appear intact. Alert and cooperative with normal attention span and concentration. No apparent delusions, illusions, hallucinations   Diabetic foot exam:  Normal inspection  except dry flaky skin, leading edge, erythematous on left medial foot No skin breakdown  No calluses  Normal DP pulses  Normal sensation to light touch and monofilament  Nails  thickened and with fungal changes, subungual debis

## 2016-03-07 NOTE — Assessment & Plan Note (Signed)
Topical treatment 

## 2016-03-07 NOTE — Progress Notes (Signed)
Pre visit review using our clinic review tool, if applicable. No additional management support is needed unless otherwise documented below in the visit note. 

## 2016-03-07 NOTE — Assessment & Plan Note (Signed)
Well controlled. Continue current medication.  

## 2016-03-07 NOTE — Assessment & Plan Note (Signed)
Well controlled with diet Encouraged exercise, weight loss, healthy eating habits.   

## 2016-03-07 NOTE — Patient Instructions (Signed)
Can try clotrimazole cream tewice daily for athlete's foot.  Can spray shoes with antifungal  Spray weekly as well.

## 2016-03-15 ENCOUNTER — Ambulatory Visit (INDEPENDENT_AMBULATORY_CARE_PROVIDER_SITE_OTHER): Payer: Medicare Other | Admitting: Family Medicine

## 2016-03-15 ENCOUNTER — Encounter: Payer: Self-pay | Admitting: Family Medicine

## 2016-03-15 VITALS — BP 104/60 | HR 54 | Temp 97.9°F | Wt 158.7 lb

## 2016-03-15 DIAGNOSIS — R059 Cough, unspecified: Secondary | ICD-10-CM

## 2016-03-15 DIAGNOSIS — J069 Acute upper respiratory infection, unspecified: Secondary | ICD-10-CM | POA: Diagnosis not present

## 2016-03-15 DIAGNOSIS — R05 Cough: Secondary | ICD-10-CM

## 2016-03-15 MED ORDER — AMOXICILLIN 875 MG PO TABS: 875.0000 mg | ORAL_TABLET | Freq: Two times a day (BID) | ORAL | 0 refills | Status: DC

## 2016-03-15 MED ORDER — PREDNISONE 20 MG PO TABS: 20.0000 mg | ORAL_TABLET | Freq: Every day | ORAL | 0 refills | Status: DC

## 2016-03-15 NOTE — Patient Instructions (Signed)
For nasal congestion you can use Afrin nasal spray for 3 days max, saline nasal spray (generic is fine for all). For cough you can try Delsym. Drink enough fluids to make your urine light yellow. For fever/chill/muscle aches you can take over the counter acetaminophen or ibuprofen.  Please come back in if you are not better in 5-7 days or if you develop wheezing, shortness of breath or persistent vomiting.   Cough, Adult Coughing is a reflex that clears your throat and your airways. Coughing helps to heal and protect your lungs. It is normal to cough occasionally, but a cough that happens with other symptoms or lasts a long time may be a sign of a condition that needs treatment. A cough may last only 2-3 weeks (acute), or it may last longer than 8 weeks (chronic). CAUSES Coughing is commonly caused by:  Breathing in substances that irritate your lungs.  A viral or bacterial respiratory infection.  Allergies.  Asthma.  Postnasal drip.  Smoking.  Acid backing up from the stomach into the esophagus (gastroesophageal reflux).  Certain medicines.  Chronic lung problems, including COPD (or rarely, lung cancer).  Other medical conditions such as heart failure. HOME CARE INSTRUCTIONS  Pay attention to any changes in your symptoms. Take these actions to help with your discomfort:  Take medicines only as told by your health care provider.  If you were prescribed an antibiotic medicine, take it as told by your health care provider. Do not stop taking the antibiotic even if you start to feel better.  Talk with your health care provider before you take a cough suppressant medicine.  Drink enough fluid to keep your urine clear or pale yellow.  If the air is dry, use a cold steam vaporizer or humidifier in your bedroom or your home to help loosen secretions.  Avoid anything that causes you to cough at work or at home.  If your cough is worse at night, try sleeping in a semi-upright  position.  Avoid cigarette smoke. If you smoke, quit smoking. If you need help quitting, ask your health care provider.  Avoid caffeine.  Avoid alcohol.  Rest as needed. SEEK MEDICAL CARE IF:   You have new symptoms.  You cough up pus.  Your cough does not get better after 2-3 weeks, or your cough gets worse.  You cannot control your cough with suppressant medicines and you are losing sleep.  You develop pain that is getting worse or pain that is not controlled with pain medicines.  You have a fever.  You have unexplained weight loss.  You have night sweats. SEEK IMMEDIATE MEDICAL CARE IF:  You cough up blood.  You have difficulty breathing.  Your heartbeat is very fast.   This information is not intended to replace advice given to you by your health care provider. Make sure you discuss any questions you have with your health care provider.   Document Released: 10/28/2010 Document Revised: 01/20/2015 Document Reviewed: 07/08/2014 Elsevier Interactive Patient Education [employer].

## 2016-03-15 NOTE — Progress Notes (Signed)
Subjective:    Patient ID: Janet Chapman, female    DOB: 12-31-1935, 80 y.o.   MRN: [id number]  HPI This is a 80 yo female who presents today with continued cough. She was seen 03/01/16 by me with cough. She was feeling better but over the last several days has had worsening cough. Has been fatigued. Sinuses are draining down back of throat. Large amount of phlegm, "not dark." Zyrtec did not help with drainage, Mucinex did not help. Sore throat, ears stopped up. No wheeze. Feels SOB.   Past Medical History:  Diagnosis Date  . Allergic rhinitis   . Diabetes mellitus without complication (HCC)    diet controlled  . GERD (gastroesophageal reflux disease)   . Hyperlipidemia   . Hypertension   . Osteoarthritis   . Urinary incontinence    Past Surgical History:  Procedure Laterality Date  . ABDOMINAL HYSTERECTOMY  1993   TA  . CARDIOVASCULAR STRESS TEST  2004   [facility]. Smith, cardiolite-normal  . CHOLECYSTECTOMY  [phone]  . COLONOSCOPY     Family History  Problem Relation Age of Onset  . Heart attack Father 46  . Dementia Mother   . Stroke Mother 27  . Ovarian cancer Sister 31  . Diabetes      Maternal side  . Ovarian cancer      Grandmother  . Ovarian cancer      aunt  . Colon cancer Neg Hx    Social History  Substance Use Topics  . Smoking status: Never Smoker  . Smokeless tobacco: Never Used  . Alcohol use Yes     Comment: rarely      Review of Systems Per HPI    Objective:   Physical Exam  Constitutional: She is oriented to person, place, and time. She appears well-developed and well-nourished. No distress.  HENT:  Head: Normocephalic and atraumatic.  Right Ear: Tympanic membrane, external ear and ear canal normal.  Left Ear: Tympanic membrane, external ear and ear canal normal.  Nose: Mucosal edema and rhinorrhea present. Right sinus exhibits no maxillary sinus tenderness and no frontal sinus tenderness. Left sinus exhibits no maxillary sinus tenderness and no  frontal sinus tenderness.  Mouth/Throat: Uvula is midline and oropharynx is clear and moist.  Post nasal drainage.   Eyes: Conjunctivae are normal.  Neck: Normal range of motion. Neck supple.  Cardiovascular: Regular rhythm and normal heart sounds.   Pulmonary/Chest: Effort normal and breath sounds normal.  Frequent, deep, non productive cough.   Neurological: She is alert and oriented to person, place, and time.  Skin: Skin is warm and dry. She is not diaphoretic.  Psychiatric: She has a normal mood and affect. Her behavior is normal. Judgment and thought content normal.  Vitals reviewed.     BP 104/60   Pulse (!) 54   Temp 97.9 F (36.6 C)   Wt 158 lb 11.2 oz (72 kg)   SpO2 97%   BMI 28.79 kg/m  Wt Readings from Last 3 Encounters:  03/15/16 158 lb 11.2 oz (72 kg)  03/07/16 158 lb 12 oz (72 kg)  02/03/16 155 lb 12 oz (70.6 kg)       Assessment & Plan:  1. Upper respiratory tract infection, unspecified type - given prolonged course, will cover for bacterial infection - RTC precautions reviewed - amoxicillin (AMOXIL) 875 MG tablet; Take 1 tablet (875 mg total) by mouth 2 (two) times daily.  Dispense: 14 tablet; Refill: 0 - predniSONE (DELTASONE) 20  MG tablet; Take 1 tablet (20 mg total) by mouth daily with breakfast.  Dispense: 7 tablet; Refill: 0- potential side effects discussed with patient.  2. Cough - increase fluids, can take [place], FNP-BC  [facility]  03/15/2016 2:51 PM

## 2016-06-15 ENCOUNTER — Other Ambulatory Visit: Payer: Self-pay | Admitting: *Deleted

## 2016-06-15 MED ORDER — LOSARTAN POTASSIUM-HCTZ 100-25 MG PO TABS: 1.0000 | ORAL_TABLET | Freq: Every day | ORAL | 1 refills | Status: DC

## 2016-06-15 MED ORDER — METOPROLOL SUCCINATE ER 50 MG PO TB24: 50.0000 mg | ORAL_TABLET | Freq: Every day | ORAL | 1 refills | Status: DC

## 2016-06-23 MED ORDER — DILTIAZEM HCL 120 MG PO TABS: 120.0000 mg | ORAL_TABLET | Freq: Every day | ORAL | 1 refills | Status: DC

## 2016-06-23 NOTE — Addendum Note (Signed)
Addended by: Carter Kitten on: 06/23/2016 12:28 PM   Modules accepted: Orders

## 2016-06-23 NOTE — Telephone Encounter (Signed)
Spoke with Janet Chapman.  She states she needs refills on her Diltiazem. Refill sent to [facility].

## 2016-06-23 NOTE — Telephone Encounter (Signed)
Pt called about the status of her medication refills.  Can you please call her to advise.

## 2016-08-17 DIAGNOSIS — Z1231 Encounter for screening mammogram for malignant neoplasm of breast: Secondary | ICD-10-CM | POA: Diagnosis not present

## 2016-08-17 DIAGNOSIS — M899 Disorder of bone, unspecified: Secondary | ICD-10-CM | POA: Diagnosis not present

## 2016-08-21 ENCOUNTER — Other Ambulatory Visit: Payer: Self-pay | Admitting: *Deleted

## 2016-08-21 MED ORDER — SIMVASTATIN 40 MG PO TABS: 40.0000 mg | ORAL_TABLET | Freq: Every day | ORAL | 1 refills | Status: DC

## 2016-08-22 ENCOUNTER — Encounter: Payer: Self-pay | Admitting: Family Medicine

## 2016-08-28 ENCOUNTER — Encounter: Payer: Self-pay | Admitting: Family Medicine

## 2016-08-29 DIAGNOSIS — M5136 Other intervertebral disc degeneration, lumbar region: Secondary | ICD-10-CM | POA: Diagnosis not present

## 2016-08-29 DIAGNOSIS — M9901 Segmental and somatic dysfunction of cervical region: Secondary | ICD-10-CM | POA: Diagnosis not present

## 2016-08-29 DIAGNOSIS — M9903 Segmental and somatic dysfunction of lumbar region: Secondary | ICD-10-CM | POA: Diagnosis not present

## 2016-08-29 DIAGNOSIS — M50322 Other cervical disc degeneration at C5-C6 level: Secondary | ICD-10-CM | POA: Diagnosis not present

## 2016-08-31 ENCOUNTER — Telehealth: Payer: Self-pay | Admitting: Family Medicine

## 2016-08-31 ENCOUNTER — Other Ambulatory Visit (INDEPENDENT_AMBULATORY_CARE_PROVIDER_SITE_OTHER): Payer: PPO

## 2016-08-31 DIAGNOSIS — E119 Type 2 diabetes mellitus without complications: Secondary | ICD-10-CM | POA: Diagnosis not present

## 2016-08-31 DIAGNOSIS — M5136 Other intervertebral disc degeneration, lumbar region: Secondary | ICD-10-CM | POA: Diagnosis not present

## 2016-08-31 DIAGNOSIS — M50322 Other cervical disc degeneration at C5-C6 level: Secondary | ICD-10-CM | POA: Diagnosis not present

## 2016-08-31 DIAGNOSIS — M8580 Other specified disorders of bone density and structure, unspecified site: Secondary | ICD-10-CM

## 2016-08-31 DIAGNOSIS — E7800 Pure hypercholesterolemia, unspecified: Secondary | ICD-10-CM

## 2016-08-31 DIAGNOSIS — M9901 Segmental and somatic dysfunction of cervical region: Secondary | ICD-10-CM | POA: Diagnosis not present

## 2016-08-31 DIAGNOSIS — M9903 Segmental and somatic dysfunction of lumbar region: Secondary | ICD-10-CM | POA: Diagnosis not present

## 2016-08-31 LAB — COMPREHENSIVE METABOLIC PANEL
ALT: 16 U/L (ref 0–35)
AST: 19 U/L (ref 0–37)
Albumin: 4.6 g/dL (ref 3.5–5.2)
Alkaline Phosphatase: 59 U/L (ref 39–117)
BUN: 15 mg/dL (ref 6–23)
CO2: 36 mEq/L — ABNORMAL HIGH (ref 19–32)
Calcium: 10.3 mg/dL (ref 8.4–10.5)
Chloride: 97 mEq/L (ref 96–112)
Creatinine, Ser: 0.93 mg/dL (ref 0.40–1.20)
GFR: 61.86 mL/min (ref >60.00)
Glucose, Bld: 105 mg/dL — ABNORMAL HIGH (ref 70–99)
Potassium: 4.2 mEq/L (ref 3.5–5.1)
Sodium: 139 mEq/L (ref 135–145)
Total Bilirubin: 0.6 mg/dL (ref 0.2–1.2)
Total Protein: 7.2 g/dL (ref 6.0–8.3)

## 2016-08-31 LAB — HEMOGLOBIN A1C: Hgb A1c MFr Bld: 6.5 % (ref 4.6–6.5)

## 2016-08-31 LAB — LIPID PANEL
Cholesterol: 180 mg/dL (ref 0–200)
HDL: 75.9 mg/dL (ref >39.00)
LDL Cholesterol: 91 mg/dL (ref 0–99)
NonHDL: 104.38
Total CHOL/HDL Ratio: 2
Triglycerides: 69 mg/dL (ref 0.0–149.0)
VLDL: 13.8 mg/dL (ref 0.0–40.0)

## 2016-08-31 LAB — VITAMIN D 25 HYDROXY (VIT D DEFICIENCY, FRACTURES): VITD: 46.39 ng/mL (ref 30.00–100.00)

## 2016-08-31 NOTE — Telephone Encounter (Signed)
-----   Message from Ellamae Sia sent at 08/16/2016 12:07 PM EDT ----- Regarding: Lab orders for Thursday, 4.19.18 Lab orders for a 6 month follow up appt

## 2016-09-01 DIAGNOSIS — M9903 Segmental and somatic dysfunction of lumbar region: Secondary | ICD-10-CM | POA: Diagnosis not present

## 2016-09-01 DIAGNOSIS — M9901 Segmental and somatic dysfunction of cervical region: Secondary | ICD-10-CM | POA: Diagnosis not present

## 2016-09-01 DIAGNOSIS — M50322 Other cervical disc degeneration at C5-C6 level: Secondary | ICD-10-CM | POA: Diagnosis not present

## 2016-09-01 DIAGNOSIS — M5136 Other intervertebral disc degeneration, lumbar region: Secondary | ICD-10-CM | POA: Diagnosis not present

## 2016-09-08 ENCOUNTER — Ambulatory Visit: Payer: Medicare Other | Admitting: Family Medicine

## 2016-09-12 ENCOUNTER — Encounter: Payer: Self-pay | Admitting: Family Medicine

## 2016-09-12 ENCOUNTER — Ambulatory Visit (INDEPENDENT_AMBULATORY_CARE_PROVIDER_SITE_OTHER): Payer: PPO | Admitting: Family Medicine

## 2016-09-12 VITALS — BP 146/73 | HR 46 | Temp 97.4°F | Ht 62.25 in | Wt 155.5 lb

## 2016-09-12 DIAGNOSIS — E119 Type 2 diabetes mellitus without complications: Secondary | ICD-10-CM

## 2016-09-12 DIAGNOSIS — R5382 Chronic fatigue, unspecified: Secondary | ICD-10-CM

## 2016-09-12 DIAGNOSIS — F068 Other specified mental disorders due to known physiological condition: Secondary | ICD-10-CM

## 2016-09-12 DIAGNOSIS — R413 Other amnesia: Secondary | ICD-10-CM

## 2016-09-12 DIAGNOSIS — E7800 Pure hypercholesterolemia, unspecified: Secondary | ICD-10-CM

## 2016-09-12 DIAGNOSIS — I10 Essential (primary) hypertension: Secondary | ICD-10-CM | POA: Diagnosis not present

## 2016-09-12 DIAGNOSIS — R5383 Other fatigue: Secondary | ICD-10-CM | POA: Insufficient documentation

## 2016-09-12 LAB — CBC WITH DIFFERENTIAL/PLATELET
Basophils Absolute: 0 10*3/uL (ref 0.0–0.1)
Basophils Relative: 0.3 % (ref 0.0–3.0)
Eosinophils Absolute: 0.1 10*3/uL (ref 0.0–0.7)
Eosinophils Relative: 1.8 % (ref 0.0–5.0)
HCT: 40.9 % (ref 36.0–46.0)
Hemoglobin: 13.9 g/dL (ref 12.0–15.0)
Lymphocytes Relative: 32.7 % (ref 12.0–46.0)
Lymphs Abs: 2.2 10*3/uL (ref 0.7–4.0)
MCHC: 33.9 g/dL (ref 30.0–36.0)
MCV: 94.6 fl (ref 78.0–100.0)
Monocytes Absolute: 0.6 10*3/uL (ref 0.1–1.0)
Monocytes Relative: 9.2 % (ref 3.0–12.0)
Neutro Abs: 3.7 10*3/uL (ref 1.4–7.7)
Neutrophils Relative %: 56 % (ref 43.0–77.0)
Platelets: 233 10*3/uL (ref 150.0–400.0)
RBC: 4.32 Mil/uL (ref 3.87–5.11)
RDW: 13.5 % (ref 11.5–15.5)
WBC: 6.6 10*3/uL (ref 4.0–10.5)

## 2016-09-12 LAB — VITAMIN B12: Vitamin B-12: 570 pg/mL (ref 211–911)

## 2016-09-12 LAB — HM DIABETES FOOT EXAM

## 2016-09-12 LAB — TSH: TSH: 1.55 u[IU]/mL (ref 0.35–4.50)

## 2016-09-12 LAB — T3, FREE: T3, Free: 3.1 pg/mL (ref 2.3–4.2)

## 2016-09-12 LAB — T4, FREE: Free T4: 0.94 ng/dL (ref 0.60–1.60)

## 2016-09-12 NOTE — Assessment & Plan Note (Signed)
Well controlled. Continue current medication.  

## 2016-09-12 NOTE — Assessment & Plan Note (Signed)
Eval with labs. 

## 2016-09-12 NOTE — Assessment & Plan Note (Signed)
Well controlled on no med. 

## 2016-09-12 NOTE — Progress Notes (Signed)
Subjective:    Patient ID: Janet Chapman, female    DOB: Feb 15, 1936, 81 y.o.   MRN: [id number]  HPI   81 year old female presents for DM follow up.   Diabetes:  Well controlled with diet Lab Results  Component Value Date   HGBA1C 6.5 08/31/2016  Feet problems:none Blood Sugars averaging: not chekcing eye exam within last year:yes  Hypertension:   Inadequate control in office, on losartan, HCTZ, diltiazem, metoprolol. She had rushed here.. Rechecked. BP Readings from Last 3 Encounters:  09/12/16 (!) 146/78  03/15/16 104/60  03/07/16 114/61  Using medication without problems or lightheadedness: none Chest pain with exertion:none Edema:none Short of breath:none Average home BPs:not checking Other issues:  Elevated Cholesterol:  Good control on simvastatin 40 mg daily. Lab Results  Component Value Date   CHOL 180 08/31/2016   HDL 75.90 08/31/2016   LDLCALC 91 08/31/2016   LDLDIRECT 119.8 04/26/2009   TRIG 69.0 08/31/2016   CHOLHDL 2 08/31/2016  Using medications without problems:none Muscle aches: none Diet compliance: good Exercise:none Other complaints:  Vit D: stable  She has been more fatigue lately in last few months.  Memory loss, mild: husband thinks it is worsening, mother with alzheimer's.  SE to aricept.  Review of Systems  Constitutional: Positive for fatigue. Negative for fever.  HENT: Negative for ear pain.   Eyes: Negative for pain.  Respiratory: Negative for chest tightness and shortness of breath.   Cardiovascular: Negative for chest pain, palpitations and leg swelling.  Gastrointestinal: Negative for abdominal pain.  Genitourinary: Negative for dysuria.  Psychiatric/Behavioral: Negative for dysphoric mood.       Decreased memory       Objective:   Physical Exam  Constitutional: She is oriented to person, place, and time. Vital signs are normal. She appears well-developed and well-nourished. She is cooperative.  Non-toxic appearance. She  does not appear ill. No distress.  HENT:  Head: Normocephalic.  Right Ear: Hearing, tympanic membrane, external ear and ear canal normal. Tympanic membrane is not erythematous, not retracted and not bulging.  Left Ear: Hearing, tympanic membrane, external ear and ear canal normal. Tympanic membrane is not erythematous, not retracted and not bulging.  Nose: No mucosal edema or rhinorrhea. Right sinus exhibits no maxillary sinus tenderness and no frontal sinus tenderness. Left sinus exhibits no maxillary sinus tenderness and no frontal sinus tenderness.  Mouth/Throat: Uvula is midline, oropharynx is clear and moist and mucous membranes are normal.  Eyes: Conjunctivae, EOM and lids are normal. Pupils are equal, round, and reactive to light. Lids are everted and swept, no foreign bodies found.  Neck: Trachea normal and normal range of motion. Neck supple. Carotid bruit is not present. No thyroid mass and no thyromegaly present.  Cardiovascular: Normal rate, regular rhythm, S1 normal, S2 normal, normal heart sounds, intact distal pulses and normal pulses.  Exam reveals no gallop and no friction rub.   No murmur heard. Pulmonary/Chest: Effort normal and breath sounds normal. No tachypnea. No respiratory distress. She has no decreased breath sounds. She has no wheezes. She has no rhonchi. She has no rales.  Abdominal: Soft. Normal appearance and bowel sounds are normal. There is no tenderness.  Neurological: She is alert and oriented to person, place, and time. She has normal strength. No cranial nerve deficit or sensory deficit. She displays a negative Romberg sign. Coordination and gait normal. GCS eye subscore is 4. GCS verbal subscore is 5. GCS motor subscore is 6.  Skin: Skin  is warm, dry and intact. No rash noted.  Psychiatric: Her speech is normal and behavior is normal. Judgment and thought content normal. Her mood appears not anxious. Cognition and memory are normal. She does not exhibit a depressed  mood.          Assessment & Plan:

## 2016-09-12 NOTE — Assessment & Plan Note (Signed)
Eval with labs. If not improving with B12.. Return for MMSE and possible trial of a different medication.

## 2016-09-12 NOTE — Progress Notes (Signed)
Pre visit review using our clinic review tool, if applicable. No additional management support is needed unless otherwise documented below in the visit note. 

## 2016-09-12 NOTE — Assessment & Plan Note (Signed)
Elevated on office despite recheck after rest. Follow at home. Eval at follow up in 2-3 months.

## 2016-09-12 NOTE — Patient Instructions (Addendum)
Follow blood pressure at honme, call if > 140/90 regularly.  Please stop at the lab to set up to have labs drawn. Start b12 1000 mcg daily b complex)  If memory is not improving and labs are all normal.. Follow up 2-3 months for memory re-eval.

## 2016-09-21 DIAGNOSIS — M9901 Segmental and somatic dysfunction of cervical region: Secondary | ICD-10-CM | POA: Diagnosis not present

## 2016-09-21 DIAGNOSIS — M9905 Segmental and somatic dysfunction of pelvic region: Secondary | ICD-10-CM | POA: Diagnosis not present

## 2016-09-21 DIAGNOSIS — M9903 Segmental and somatic dysfunction of lumbar region: Secondary | ICD-10-CM | POA: Diagnosis not present

## 2016-09-21 DIAGNOSIS — M5136 Other intervertebral disc degeneration, lumbar region: Secondary | ICD-10-CM | POA: Diagnosis not present

## 2016-09-21 DIAGNOSIS — M50322 Other cervical disc degeneration at C5-C6 level: Secondary | ICD-10-CM | POA: Diagnosis not present

## 2016-10-31 DIAGNOSIS — M5136 Other intervertebral disc degeneration, lumbar region: Secondary | ICD-10-CM | POA: Diagnosis not present

## 2016-10-31 DIAGNOSIS — M9901 Segmental and somatic dysfunction of cervical region: Secondary | ICD-10-CM | POA: Diagnosis not present

## 2016-10-31 DIAGNOSIS — M9903 Segmental and somatic dysfunction of lumbar region: Secondary | ICD-10-CM | POA: Diagnosis not present

## 2016-10-31 DIAGNOSIS — M50322 Other cervical disc degeneration at C5-C6 level: Secondary | ICD-10-CM | POA: Diagnosis not present

## 2016-11-01 DIAGNOSIS — L608 Other nail disorders: Secondary | ICD-10-CM | POA: Diagnosis not present

## 2016-11-01 DIAGNOSIS — B353 Tinea pedis: Secondary | ICD-10-CM | POA: Diagnosis not present

## 2016-11-14 ENCOUNTER — Telehealth: Payer: Self-pay | Admitting: Family Medicine

## 2016-11-14 DIAGNOSIS — E7800 Pure hypercholesterolemia, unspecified: Secondary | ICD-10-CM

## 2016-11-14 DIAGNOSIS — E119 Type 2 diabetes mellitus without complications: Secondary | ICD-10-CM

## 2016-11-14 NOTE — Telephone Encounter (Signed)
Left pt message asking to call Ebony Hail back directly at [phone] to schedule AWV + labs with Katha Cabal and CPE with PCP.  Last AWV 06/2015

## 2016-11-23 NOTE — Telephone Encounter (Signed)
Pt scheduled for 11/27/16 for AWV + labs.  Please order appropriate labs. Thanks

## 2016-11-23 NOTE — Addendum Note (Signed)
Addended by: [name] E on: 11/23/2016 04:43 PM   Modules accepted: Orders

## 2016-11-24 NOTE — Progress Notes (Signed)
Subjective:   Janet Chapman is a 81 y.o. female who presents for Medicare Annual (Subsequent) preventive examination.  Review of Systems:  No ROS.  Medicare Wellness Visit. Additional risk factors are reflected in the social history.  Cardiac Risk Factors include: dyslipidemia;advanced age (>18men, >46 women);hypertension     Objective:     Vitals: BP 126/64   Pulse (!) 50   Ht 5\' 2"  (1.575 m)   Wt 156 lb 4 oz (70.9 kg)   SpO2 98%   BMI 28.58 kg/m   Body mass index is 28.58 kg/m.   Tobacco History  Smoking Status  . Never Smoker  Smokeless Tobacco  . Never Used     Counseling given: Not Answered   Past Medical History:  Diagnosis Date  . Allergic rhinitis   . Diabetes mellitus without complication (HCC)    diet controlled  . GERD (gastroesophageal reflux disease)   . Hyperlipidemia   . Hypertension   . Osteoarthritis   . Urinary incontinence    Past Surgical History:  Procedure Laterality Date  . ABDOMINAL HYSTERECTOMY  1993   TA  . CARDIOVASCULAR STRESS TEST  2004   H. Smith, cardiolite-normal  . CHOLECYSTECTOMY  [phone]  . COLONOSCOPY     Family History  Problem Relation Age of Onset  . Heart attack Father 14  . Dementia Mother   . Stroke Mother 57  . Ovarian cancer Sister 24  . Diabetes Unknown        Maternal side  . Ovarian cancer Unknown        Grandmother  . Ovarian cancer Unknown        aunt  . Colon cancer Neg Hx    History  Sexual Activity  . Sexual activity: No    Outpatient Encounter Prescriptions as of 11/27/2016  Medication Sig  . aspirin 81 MG tablet Take 81 mg by mouth daily.    Marland Kitchen diltiazem (CARDIZEM) 120 MG tablet Take 1 tablet (120 mg total) by mouth daily.  Marland Kitchen losartan-hydrochlorothiazide (HYZAAR) 100-25 MG tablet Take 1 tablet by mouth daily.  . metoprolol succinate (TOPROL-XL) 50 MG 24 hr tablet Take 1 tablet (50 mg total) by mouth daily.  . simvastatin (ZOCOR) 40 MG tablet Take 1 tablet (40 mg total) by mouth at  bedtime.  Marland Kitchen glucose blood test strip Use as instructed to check blood sugar once a day   . [DISCONTINUED] 0.9 %  sodium chloride infusion    No facility-administered encounter medications on file as of 11/27/2016.     Activities of Daily Living In your present state of health, do you have any difficulty performing the following activities: 11/27/2016  Hearing? N  Vision? N  Difficulty concentrating or making decisions? N  Walking or climbing stairs? N  Dressing or bathing? N  Doing errands, shopping? N  Preparing Food and eating ? N  Using the Toilet? N  In the past six months, have you accidently leaked urine? N  Do you have problems with loss of bowel control? N  Managing your Medications? N  Managing your Finances? N  Housekeeping or managing your Housekeeping? N  Some recent data might be hidden    Patient Care Team: [name], MD as PCP - General Shawnie Dapper, DO (Optometry)    Assessment:    Physical assessment deferred to PCP.  Exercise Activities and Dietary recommendations Current Exercise Habits: The patient does not participate in regular exercise at present  Goals  None     Fall Risk Fall Risk  11/27/2016 10/01/2015 07/08/2015 07/09/2014 06/26/2013  Falls in the past year? No No No No No   Depression Screen PHQ 2/9 Scores 11/27/2016 10/01/2015 07/08/2015 07/09/2014  PHQ - 2 Score 0 0 0 0     Cognitive Function PLEASE NOTE: A Mini-Cog screen was completed. Maximum score is 20. A value of 0 denotes this part of Folstein MMSE was not completed or the patient failed this part of the Mini-Cog screening.   Mini-Cog Screening Orientation to Time - Max 5 pts Orientation to Place - Max 5 pts Registration - Max 3 pts Recall - Max 3 pts Language Repeat - Max 1 pts Language Follow 3 Step Command - Max 3 pts      Mini-Cog - 11/27/16 0830    Normal clock drawing test? yes   How many words correct? 2      MMSE - Mini Mental State Exam 11/27/2016    Orientation to time 5  Orientation to Place 5  Registration 3  Attention/ Calculation 0  Recall 2  Language- name 2 objects 0  Language- repeat 1  Language- follow 3 step command 3  Language- read & follow direction 0  Write a sentence 0  Copy design 0  Total score 19        Immunization History  Administered Date(s) Administered  . Influenza Split 03/16/2011, 02/21/2012, 02/06/2013  . Influenza Whole 02/13/2007, 03/12/2008, 02/09/2009, 02/12/2009  . Influenza, Seasonal, Injecte, Preservative Fre 02/17/2014  . Influenza,inj,Quad PF,36+ Mos 01/07/2015, 02/03/2016  . Pneumococcal Conjugate-13 06/26/2013  . Pneumococcal Polysaccharide-23 05/16/2003, 05/20/2010  . Td 04/19/2007  . Zoster 05/20/2010   Screening Tests Health Maintenance  Topic Date Due  . OPHTHALMOLOGY EXAM  12/09/2016  . INFLUENZA VACCINE  12/13/2016  . HEMOGLOBIN A1C  03/02/2017  . TETANUS/TDAP  04/18/2017  . MAMMOGRAM  08/17/2017  . FOOT EXAM  09/12/2017  . DEXA SCAN  Completed  . PNA vac Low Risk Adult  Completed      Plan:    Follow-up w/ PCP as scheduled.   I have personally reviewed and noted the following in the patient's chart:   . Medical and social history . Use of alcohol, tobacco or illicit drugs  . Current medications and supplements . Functional ability and status . Nutritional status . Physical activity . Advanced directives . List of other physicians . Vitals . Screenings to include cognitive, depression, and falls . Referrals and appointments  In addition, I have reviewed and discussed with patient certain preventive protocols, quality metrics, and best practice recommendations. A written personalized care plan for preventive services as well as general preventive health recommendations were provided to patient.     Dorrene German, RN  11/27/2016

## 2016-11-24 NOTE — Progress Notes (Signed)
PCP notes:   Health maintenance: No gaps identified.   Abnormal screenings:  Hearing-failed.   Patient concerns: None, however she does report her husband continues to be concerned about her memory but 'I'm not worried about it.'   Nurse concerns: None   Next PCP appt: 12/08/2016 @ 10:15am

## 2016-11-27 ENCOUNTER — Other Ambulatory Visit (INDEPENDENT_AMBULATORY_CARE_PROVIDER_SITE_OTHER): Payer: PPO

## 2016-11-27 ENCOUNTER — Ambulatory Visit (INDEPENDENT_AMBULATORY_CARE_PROVIDER_SITE_OTHER): Payer: PPO

## 2016-11-27 VITALS — BP 126/64 | HR 50 | Ht 62.0 in | Wt 156.2 lb

## 2016-11-27 DIAGNOSIS — Z0000 Encounter for general adult medical examination without abnormal findings: Secondary | ICD-10-CM

## 2016-11-27 DIAGNOSIS — E119 Type 2 diabetes mellitus without complications: Secondary | ICD-10-CM | POA: Diagnosis not present

## 2016-11-27 LAB — HEMOGLOBIN A1C: Hgb A1c MFr Bld: 6.4 % (ref 4.6–6.5)

## 2016-11-27 NOTE — Patient Instructions (Addendum)
Janet Chapman , Thank you for taking time to come for your Medicare Wellness Visit. I appreciate your ongoing commitment to your health goals. Please review the following plan we discussed and let me know if I can assist you in the future.   Bring a copy of your advance directives to your next office visit.  These are the goals we discussed: Goals    . Patient Stated (pt-stated)          Starting 11/27/2016, I will continue reading daily to keep my brain sharp.       This is a list of the screening recommended for you and due dates:  Health Maintenance  Topic Date Due  . Eye exam for diabetics  12/09/2016  . Flu Shot  12/13/2016  . Hemoglobin A1C  03/02/2017  . Tetanus Vaccine  04/18/2017  . Mammogram  08/17/2017  . Complete foot exam   09/12/2017  . DEXA scan (bone density measurement)  Completed  . Pneumonia vaccines  Completed   Preventive Care for Adults  A healthy lifestyle and preventive care can promote health and wellness. Preventive health guidelines for adults include the following key practices.  . A routine yearly physical is a good way to check with your health care provider about your health and preventive screening. It is a chance to share any concerns and updates on your health and to receive a thorough exam.  . Visit your dentist for a routine exam and preventive care every 6 months. Brush your teeth twice a day and floss once a day. Good oral hygiene prevents tooth decay and gum disease.  . The frequency of eye exams is based on your age, health, family medical history, use  of contact lenses, and other factors. Follow your health care provider's ecommendations for frequency of eye exams.  . Eat a healthy diet. Foods like vegetables, fruits, whole grains, low-fat dairy products, and lean protein foods contain the nutrients you need without too many calories. Decrease your intake of foods high in solid fats, added sugars, and salt. Eat the right amount of calories  for you. Get information about a proper diet from your health care provider, if necessary.  . Regular physical exercise is one of the most important things you can do for your health. Most adults should get at least 150 minutes of moderate-intensity exercise (any activity that increases your heart rate and causes you to sweat) each week. In addition, most adults need muscle-strengthening exercises on 2 or more days a week.  Silver Sneakers may be a benefit available to you. To determine eligibility, you may visit the website: www.silversneakers.com or contact program at (626) 678-5833 Mon-Fri between 8AM-8PM.   . Maintain a healthy weight. The body mass index (BMI) is a screening tool to identify possible weight problems. It provides an estimate of body fat based on height and weight. Your health care provider can find your BMI and can help you achieve or maintain a healthy weight.   For adults 20 years and older: ? A BMI below 18.5 is considered underweight. ? A BMI of 18.5 to 24.9 is normal. ? A BMI of 25 to 29.9 is considered overweight. ? A BMI of 30 and above is considered obese.   . Maintain normal blood lipids and cholesterol levels by exercising and minimizing your intake of saturated fat. Eat a balanced diet with plenty of fruit and vegetables. Blood tests for lipids and cholesterol should begin at age 54 and be repeated every  5 years. If your lipid or cholesterol levels are high, you are over 50, or you are at high risk for heart disease, you may need your cholesterol levels checked more frequently. Ongoing high lipid and cholesterol levels should be treated with medicines if diet and exercise are not working.  . If you smoke, find out from your health care provider how to quit. If you do not use tobacco, please do not start.  . If you choose to drink alcohol, please do not consume more than 2 drinks per day. One drink is considered to be 12 ounces (355 mL) of beer, 5 ounces (148 mL) of  wine, or 1.5 ounces (44 mL) of liquor.  . If you are 66-44 years old, ask your health care provider if you should take aspirin to prevent strokes.  . Use sunscreen. Apply sunscreen liberally and repeatedly throughout the day. You should seek shade when your shadow is shorter than you. Protect yourself by wearing long sleeves, pants, a wide-brimmed hat, and sunglasses year round, whenever you are outdoors.  . Once a month, do a whole body skin exam, using a mirror to look at the skin on your back. Tell your health care provider of new moles, moles that have irregular borders, moles that are larger than a pencil eraser, or moles that have changed in shape or color.

## 2016-11-27 NOTE — Progress Notes (Signed)
I reviewed health advisor's note, was available for consultation, and agree with documentation and plan.   Signed,  Joleena Weisenburger T. Gracelee Stemmler, MD  

## 2016-12-08 ENCOUNTER — Encounter: Payer: Self-pay | Admitting: Family Medicine

## 2016-12-08 ENCOUNTER — Ambulatory Visit (INDEPENDENT_AMBULATORY_CARE_PROVIDER_SITE_OTHER): Payer: PPO | Admitting: Family Medicine

## 2016-12-08 DIAGNOSIS — E119 Type 2 diabetes mellitus without complications: Secondary | ICD-10-CM

## 2016-12-08 DIAGNOSIS — I10 Essential (primary) hypertension: Secondary | ICD-10-CM | POA: Diagnosis not present

## 2016-12-08 DIAGNOSIS — F068 Other specified mental disorders due to known physiological condition: Secondary | ICD-10-CM

## 2016-12-08 DIAGNOSIS — R413 Other amnesia: Secondary | ICD-10-CM

## 2016-12-08 DIAGNOSIS — E7800 Pure hypercholesterolemia, unspecified: Secondary | ICD-10-CM | POA: Diagnosis not present

## 2016-12-08 DIAGNOSIS — R5382 Chronic fatigue, unspecified: Secondary | ICD-10-CM | POA: Diagnosis not present

## 2016-12-08 LAB — HM DIABETES FOOT EXAM

## 2016-12-08 MED ORDER — METOPROLOL SUCCINATE ER 50 MG PO TB24: 50.0000 mg | ORAL_TABLET | Freq: Every day | ORAL | 3 refills | Status: DC

## 2016-12-08 MED ORDER — LOSARTAN POTASSIUM-HCTZ 100-25 MG PO TABS: 1.0000 | ORAL_TABLET | Freq: Every day | ORAL | 3 refills | Status: DC

## 2016-12-08 NOTE — Patient Instructions (Addendum)
Trial of vitamin E 200 IU daily  Start regular exercise daily.  Moisturize after bath.  Consider hypoallergenic detergent etc.

## 2016-12-08 NOTE — Progress Notes (Signed)
Subjective:    Patient ID: Janet Chapman, female    DOB: 1935-07-13, 81 y.o.   MRN: [id number]  HPI   The patient presents for complete physical and review of chronic health problems. He/She also has the following acute concerns today: Her husband still concerned about  Her memory given her mother's history of dementia. Forgets names here and there. Dementia eval  MMSE: 19/20  The patient saw Candis Musa, LPN for medicare wellness. Note reviewed in detail and important notes copied below. Abnormal screenings:  Hearing-failed.  Diabetes:   Lab Results  Component Value Date   HGBA1C 6.4 11/27/2016  Using medications without difficulties: Hypoglycemic episodes: Hyperglycemic episodes: Feet problems:none, discolored toenails Blood Sugars averaging: not checking  eye exam within last year:yes  Elevated Cholesterol:  At goal on simvastatin 40 mg daily Lab Results  Component Value Date   CHOL 180 08/31/2016   HDL 75.90 08/31/2016   LDLCALC 91 08/31/2016   LDLDIRECT 119.8 04/26/2009   TRIG 69.0 08/31/2016   CHOLHDL 2 08/31/2016  Using medications without problems: Muscle aches: none Diet compliance: healthy, trying to increase fiber in diet. Exercise: NONE Other complaints:  Hypertension:  diltiazem, losartan, HCTZ metoprolol.  Adequate control on current regimen. Using medication without problems or lightheadedness:  none Chest pain with exertion: none Edema: none  Short of breath:none Average home BPs: good at home Other issues:   Body mass index is 28.67 kg/m.    Blood pressure 133/77, pulse (!) 50, temperature 97.7 F (36.5 C), temperature source Oral, height 5\' 2"  (1.575 m), weight 156 lb 12 oz (71.1 kg).  Social History /Family History/Past Medical History reviewed in detail and updated in EMR if needed.    Review of Systems  Constitutional: Positive for fatigue. Negative for fever.  HENT: Negative for congestion.   Eyes: Negative for pain.    Respiratory: Negative for cough and shortness of breath.   Cardiovascular: Negative for chest pain, palpitations and leg swelling.  Gastrointestinal: Negative for abdominal pain.  Genitourinary: Negative for dysuria and vaginal bleeding.  Musculoskeletal: Negative for back pain.  Neurological: Negative for syncope, light-headedness and headaches.  Psychiatric/Behavioral: Negative for dysphoric mood.       Objective:   Physical Exam  Constitutional: Vital signs are normal. She appears well-developed and well-nourished. She is cooperative.  Non-toxic appearance. She does not appear ill. No distress.  HENT:  Head: Normocephalic.  Right Ear: Hearing, tympanic membrane, external ear and ear canal normal.  Left Ear: Hearing, tympanic membrane, external ear and ear canal normal.  Nose: Nose normal.  Eyes: Pupils are equal, round, and reactive to light. Conjunctivae, EOM and lids are normal. Lids are everted and swept, no foreign bodies found.  Neck: Trachea normal and normal range of motion. Neck supple. Carotid bruit is not present. No thyroid mass and no thyromegaly present.  Cardiovascular: Normal rate, regular rhythm, S1 normal, S2 normal, normal heart sounds and intact distal pulses.  Exam reveals no gallop.   No murmur heard. Pulmonary/Chest: Effort normal and breath sounds normal. No respiratory distress. She has no wheezes. She has no rhonchi. She has no rales.  Abdominal: Soft. Normal appearance and bowel sounds are normal. She exhibits no distension, no fluid wave, no abdominal bruit and no mass. There is no hepatosplenomegaly. There is no tenderness. There is no rebound, no guarding and no CVA tenderness. No hernia.  Lymphadenopathy:    She has no cervical adenopathy.    She has no axillary adenopathy.  Neurological: She is alert. She has normal strength. No cranial nerve deficit or sensory deficit.  Skin: Skin is warm, dry and intact. No rash noted.  Psychiatric: Her speech is  normal and behavior is normal. Judgment normal. Her mood appears not anxious. Cognition and memory are normal. She does not exhibit a depressed mood.     Diabetic foot exam: Normal inspection No skin breakdown No calluses  Normal DP pulses Normal sensation to light touch and monofilament discolored toenails     Assessment & Plan:  The patient's preventative maintenance and recommended screening tests for an annual wellness exam were reviewed in full today. Brought up to date unless services declined.  Counselled on the importance of diet, exercise, and its role in overall health and mortality. The patient's FH and SH was reviewed, including their home life, tobacco status, and drug and alcohol status.   Vaccines: Uptodate with flu, Td, shingles and pneumovax, prevnar  Mammo: 3D Had 4/18 per pt nml DVE: pap and DVE not indicated. TAH. Sister with ovarian cancer  DEXA:mild osteopenia 07/2012, repeat 5 year work on walking, Ca and Vit D Colon: 02/01/2005 Nml, Dr. Wynetta Emery repeat in 10 years but pt wishes to do yearly stool test instead. SET up with COLOGUARD this year Nonsmoker

## 2016-12-08 NOTE — Assessment & Plan Note (Signed)
Well controlled. Continue current medication.  

## 2016-12-08 NOTE — Assessment & Plan Note (Signed)
Diet controlled. Encouraged exercise, weight loss, healthy eating habits.  

## 2016-12-08 NOTE — Assessment & Plan Note (Signed)
Adequate control on statin.

## 2016-12-08 NOTE — Assessment & Plan Note (Signed)
Start vit E 2000 IU daily.  Continue to follow.

## 2016-12-08 NOTE — Assessment & Plan Note (Signed)
Chronic .Shaune Leeks due to deconditioning.. Recommend exercise

## 2016-12-12 ENCOUNTER — Other Ambulatory Visit: Payer: Self-pay | Admitting: *Deleted

## 2016-12-12 DIAGNOSIS — H43813 Vitreous degeneration, bilateral: Secondary | ICD-10-CM | POA: Diagnosis not present

## 2016-12-12 DIAGNOSIS — H353131 Nonexudative age-related macular degeneration, bilateral, early dry stage: Secondary | ICD-10-CM | POA: Diagnosis not present

## 2016-12-12 DIAGNOSIS — H5203 Hypermetropia, bilateral: Secondary | ICD-10-CM | POA: Diagnosis not present

## 2016-12-12 DIAGNOSIS — H52223 Regular astigmatism, bilateral: Secondary | ICD-10-CM | POA: Diagnosis not present

## 2016-12-12 DIAGNOSIS — E119 Type 2 diabetes mellitus without complications: Secondary | ICD-10-CM | POA: Diagnosis not present

## 2016-12-12 DIAGNOSIS — H2513 Age-related nuclear cataract, bilateral: Secondary | ICD-10-CM | POA: Diagnosis not present

## 2016-12-12 DIAGNOSIS — H524 Presbyopia: Secondary | ICD-10-CM | POA: Diagnosis not present

## 2016-12-12 LAB — HM DIABETES EYE EXAM

## 2016-12-12 NOTE — Patient Outreach (Signed)
[facility] Screening call, unsuccessful but left a message for a return call. If I do not hear back from the member I will try again within the week.  Airen Stiehl C. Kristapher [place], MSN, GNP-BC Gerontological Nurse Practitioner [facility] Care Management [phone]  

## 2016-12-13 ENCOUNTER — Encounter: Payer: Self-pay | Admitting: Family Medicine

## 2016-12-13 ENCOUNTER — Other Ambulatory Visit: Payer: Self-pay | Admitting: *Deleted

## 2016-12-13 MED ORDER — DILTIAZEM HCL 120 MG PO TABS: 120.0000 mg | ORAL_TABLET | Freq: Every day | ORAL | 3 refills | Status: DC

## 2016-12-14 ENCOUNTER — Other Ambulatory Visit: Payer: Self-pay | Admitting: *Deleted

## 2016-12-14 ENCOUNTER — Encounter: Payer: Self-pay | Admitting: *Deleted

## 2016-12-14 NOTE — Patient Outreach (Signed)
[facility] Screen. I talked with [name] briefly today and he reports he and his wife are doing well and do not have any medical needs that are not being addressed. I advised him I will send our information so that they can decide if they may need our services.  Janet Chapman. Myrtie Neither, MSN, [facility] Gerontological Nurse Practitioner [facility] Care Management [phone]

## 2016-12-20 DIAGNOSIS — Z1212 Encounter for screening for malignant neoplasm of rectum: Secondary | ICD-10-CM | POA: Diagnosis not present

## 2016-12-20 DIAGNOSIS — Z1211 Encounter for screening for malignant neoplasm of colon: Secondary | ICD-10-CM | POA: Diagnosis not present

## 2016-12-20 LAB — COLOGUARD: Cologuard: POSITIVE

## 2016-12-29 ENCOUNTER — Telehealth: Payer: Self-pay | Admitting: Family Medicine

## 2016-12-29 DIAGNOSIS — R195 Other fecal abnormalities: Secondary | ICD-10-CM

## 2016-12-29 NOTE — Telephone Encounter (Signed)
Dr. [name] received paper report of this pt's results and she noted. "Let pt know cologuard postivie, would recommend colonoscopy or at least GI consult"  Dr. [name]- I spoke with pt and she states she would rather do the GI consult

## 2017-01-01 ENCOUNTER — Encounter: Payer: Self-pay | Admitting: Family Medicine

## 2017-01-05 DIAGNOSIS — M9904 Segmental and somatic dysfunction of sacral region: Secondary | ICD-10-CM | POA: Diagnosis not present

## 2017-01-05 DIAGNOSIS — M9903 Segmental and somatic dysfunction of lumbar region: Secondary | ICD-10-CM | POA: Diagnosis not present

## 2017-01-05 DIAGNOSIS — M9905 Segmental and somatic dysfunction of pelvic region: Secondary | ICD-10-CM | POA: Diagnosis not present

## 2017-01-05 DIAGNOSIS — M5136 Other intervertebral disc degeneration, lumbar region: Secondary | ICD-10-CM | POA: Diagnosis not present

## 2017-01-09 ENCOUNTER — Telehealth: Payer: Self-pay | Admitting: Family Medicine

## 2017-01-09 NOTE — Telephone Encounter (Signed)
Janet Chapman notified as instructed by telephone.  She feels if something is there she would want to be treated due to her husband having so many health issues and he needs her to take care of him.  She is already scheduled with GI in October to discuss positive cologuard.  She is on a waiting list if they have a cancellation to get in sooner with them.

## 2017-01-09 NOTE — Telephone Encounter (Signed)
Pt called to discuss results from cologuard test. She was told it was positive but she doesn't understand what that means. She is requesting a cb, if not avail at [phone] then try [phone] cell.

## 2017-01-09 NOTE — Telephone Encounter (Signed)
Call pt.. This means that EITHER or BOTH  microscopic blood in stool or DNA markers for colon cancer. There is also a chance that it is "false positive".. Risk of this is greater in > 79 year old. If she thinks she would treat  cancer if it was found.. Then she should consider colonoscopy to make sure no cancer present. If she does not think she would treat a cancer even if it was found.. Then there is no reason to do further testing and we will stop colon cancer screening altogether.  if this does not answer her questions or if she would like to speak with me personally.. Let me know.

## 2017-02-02 DIAGNOSIS — M5136 Other intervertebral disc degeneration, lumbar region: Secondary | ICD-10-CM | POA: Diagnosis not present

## 2017-02-02 DIAGNOSIS — M9905 Segmental and somatic dysfunction of pelvic region: Secondary | ICD-10-CM | POA: Diagnosis not present

## 2017-02-02 DIAGNOSIS — M9903 Segmental and somatic dysfunction of lumbar region: Secondary | ICD-10-CM | POA: Diagnosis not present

## 2017-02-02 DIAGNOSIS — M9904 Segmental and somatic dysfunction of sacral region: Secondary | ICD-10-CM | POA: Diagnosis not present

## 2017-02-16 ENCOUNTER — Other Ambulatory Visit: Payer: Self-pay | Admitting: Family Medicine

## 2017-02-22 ENCOUNTER — Ambulatory Visit: Payer: PPO

## 2017-03-07 DIAGNOSIS — M9903 Segmental and somatic dysfunction of lumbar region: Secondary | ICD-10-CM | POA: Diagnosis not present

## 2017-03-07 DIAGNOSIS — M5136 Other intervertebral disc degeneration, lumbar region: Secondary | ICD-10-CM | POA: Diagnosis not present

## 2017-03-07 DIAGNOSIS — M9904 Segmental and somatic dysfunction of sacral region: Secondary | ICD-10-CM | POA: Diagnosis not present

## 2017-03-07 DIAGNOSIS — M9905 Segmental and somatic dysfunction of pelvic region: Secondary | ICD-10-CM | POA: Diagnosis not present

## 2017-03-09 DIAGNOSIS — M9905 Segmental and somatic dysfunction of pelvic region: Secondary | ICD-10-CM | POA: Diagnosis not present

## 2017-03-09 DIAGNOSIS — M5136 Other intervertebral disc degeneration, lumbar region: Secondary | ICD-10-CM | POA: Diagnosis not present

## 2017-03-09 DIAGNOSIS — M9903 Segmental and somatic dysfunction of lumbar region: Secondary | ICD-10-CM | POA: Diagnosis not present

## 2017-03-09 DIAGNOSIS — M9904 Segmental and somatic dysfunction of sacral region: Secondary | ICD-10-CM | POA: Diagnosis not present

## 2017-03-12 ENCOUNTER — Encounter: Payer: Self-pay | Admitting: Gastroenterology

## 2017-03-12 ENCOUNTER — Ambulatory Visit (INDEPENDENT_AMBULATORY_CARE_PROVIDER_SITE_OTHER): Payer: PPO | Admitting: Gastroenterology

## 2017-03-12 ENCOUNTER — Encounter (INDEPENDENT_AMBULATORY_CARE_PROVIDER_SITE_OTHER): Payer: Self-pay

## 2017-03-12 VITALS — BP 156/70 | HR 60 | Ht 62.0 in | Wt 159.2 lb

## 2017-03-12 DIAGNOSIS — R195 Other fecal abnormalities: Secondary | ICD-10-CM | POA: Diagnosis not present

## 2017-03-12 DIAGNOSIS — K5730 Diverticulosis of large intestine without perforation or abscess without bleeding: Secondary | ICD-10-CM

## 2017-03-12 DIAGNOSIS — K5902 Outlet dysfunction constipation: Secondary | ICD-10-CM

## 2017-03-12 NOTE — Progress Notes (Signed)
Janet Chapman    [id number]    1936-02-16  Primary Care Physician:Bedsole, Mervyn Gay, MD  Referring Physician: [name], MD [place], [place] [address]  Chief complaint:  Positive Cologaurd  HPI: 81 year old female here to discuss positive Colo guard results. She was recommended to do Cologaurd by Dr [name].  She had colonoscopy September 2006 with diverticulosis and no polyps.  Recent colonoscopy July 2017 showed severe diverticulosis and internal hemorrhoids, no polyps. Bowel prep was good. No family history of colon cancer. She denies any rectal bleeding, abdominal pain, weight loss or change in bowel habits. She has chronic constipation with incomplete evacuation, improved to some extent with dietary changes and pelvic floor PT with biofeedback.     Outpatient Encounter Prescriptions as of 03/12/2017  Medication Sig  . aspirin 81 MG tablet Take 81 mg by mouth daily.    Marland Kitchen diltiazem (CARDIZEM) 120 MG tablet Take 1 tablet (120 mg total) by mouth daily.  Marland Kitchen glucose blood test strip Use as instructed to check blood sugar once a day   . losartan-hydrochlorothiazide (HYZAAR) 100-25 MG tablet Take 1 tablet by mouth daily.  . metoprolol succinate (TOPROL-XL) 50 MG 24 hr tablet Take 1 tablet (50 mg total) by mouth daily.  . simvastatin (ZOCOR) 40 MG tablet TAKE 1 TABLET BY MOUTH AT BEDTIME   No facility-administered encounter medications on file as of 03/12/2017.     Allergies as of 03/12/2017 - Review Complete 03/12/2017  Allergen Reaction Noted  . Codeine Nausea Only     Past Medical History:  Diagnosis Date  . Allergic rhinitis   . Diabetes mellitus without complication (HCC)    diet controlled  . GERD (gastroesophageal reflux disease)   . Hyperlipidemia   . Hypertension   . Osteoarthritis   . Urinary incontinence     Past Surgical History:  Procedure Laterality Date  . ABDOMINAL HYSTERECTOMY  1993   TA  . CARDIOVASCULAR STRESS TEST   2004   H. Smith, cardiolite-normal  . CHOLECYSTECTOMY  [phone]  . COLONOSCOPY      Family History  Problem Relation Age of Onset  . Heart attack Father 70  . Dementia Mother   . Stroke Mother 80  . Ovarian cancer Sister 43  . Diabetes Unknown        Maternal side  . Ovarian cancer Unknown        Grandmother  . Ovarian cancer Unknown        aunt  . Colon cancer Neg Hx     Social History   Social History  . Marital status: Married    Spouse name: N/A  . Number of children: 0  . Years of education: N/A   Occupational History  . retired    Social History Main Topics  . Smoking status: Never Smoker  . Smokeless tobacco: Never Used  . Alcohol use Yes     Comment: rarely  . Drug use: No  . Sexual activity: No   Other Topics Concern  . Not on file   Social History Narrative    Regular exercise: yes, Curves 2x a week   Married 48 + years    Diet: fruit and veggies, no FF, some water, artificial sweetener   HCPOA: [name], has living will, full code ( reviewed 2014)               Review of systems:  Review of Systems  Constitutional: Negative for fever and chills.  HENT: Negative.   Eyes: Negative for blurred vision.  Respiratory: Negative for cough, shortness of breath and wheezing.   Cardiovascular: Negative for chest pain and palpitations.  Gastrointestinal: as per HPI Genitourinary: Negative for dysuria, urgency, frequency and hematuria.  Musculoskeletal: Positive for myalgias, back pain and joint pain.  Skin: Negative for itching and rash.  Neurological: Negative for dizziness, tremors, focal weakness, seizures and loss of consciousness.  Endo/Heme/Allergies: Positive for seasonal allergies.  Psychiatric/Behavioral: Negative for depression, suicidal ideas and hallucinations.  All other systems reviewed and are negative.   Physical Exam: Vitals:   03/12/17 1336  BP: (!) 156/70  Pulse: 60   Body mass index is 29.13 kg/m. Gen:      No acute  distress HEENT:  EOMI, sclera anicteric Neck:     No masses; no thyromegaly Lungs:    Clear to auscultation bilaterally; normal respiratory effort CV:         Regular rate and rhythm; no murmurs Abd:      + bowel sounds; soft, non-tender; no palpable masses, no distension Ext:    No edema; adequate peripheral perfusion Skin:      Warm and dry; no rash Neuro: alert and oriented x 3 Psych: normal mood and affect  Data Reviewed:  Reviewed labs, radiology imaging, old records and pertinent past GI work up   Assessment and Plan/Recommendations:  80 yr F with history of chronic constipation and severe left sided diverticulosis here to discuss positive cologaurd results.  Discussed in detail with patient that given she had a colonoscopy 1 year ago and prior to that in 2006 with no polyps, risk for colon cancer is extremely low/unlikely. Likely false positive cologaurd or potentially could have a missed flat polyp Based on current guidelines Cologaurd testing is only recommended if its 10 or more years from average risk screening colonoscopy. It is not very clear the reason for doing Cologaurd test, on further chart review, felt that Dr [name] may not have been aware of recent colonoscopy that patient underwent.  Patient doesn't want to do any further testing and declined repeat colonoscopy  Constipation with outlet dysfunction: continue high fiber diet and 8-10 cups of water daily Continue pelvic floor exercises  Return as needed   25 minutes was spent face-to-face with the patient. Greater than 50% of the time used for counseling as well as treatment plan and follow-up. She had multiple questions which were answered to her satisfaction  K. Denzil Magnuson , MD [phone] Mon-Fri 8a-5p [phone] after 5p, weekends, holidays  CC: [name], MD

## 2017-03-12 NOTE — Patient Instructions (Signed)
Follow up as needed

## 2017-03-14 DIAGNOSIS — M5136 Other intervertebral disc degeneration, lumbar region: Secondary | ICD-10-CM | POA: Diagnosis not present

## 2017-03-14 DIAGNOSIS — M9905 Segmental and somatic dysfunction of pelvic region: Secondary | ICD-10-CM | POA: Diagnosis not present

## 2017-03-14 DIAGNOSIS — M9904 Segmental and somatic dysfunction of sacral region: Secondary | ICD-10-CM | POA: Diagnosis not present

## 2017-03-14 DIAGNOSIS — M9903 Segmental and somatic dysfunction of lumbar region: Secondary | ICD-10-CM | POA: Diagnosis not present

## 2017-03-16 ENCOUNTER — Ambulatory Visit (INDEPENDENT_AMBULATORY_CARE_PROVIDER_SITE_OTHER): Payer: PPO | Admitting: Family Medicine

## 2017-03-16 ENCOUNTER — Encounter: Payer: Self-pay | Admitting: Family Medicine

## 2017-03-16 DIAGNOSIS — J309 Allergic rhinitis, unspecified: Secondary | ICD-10-CM | POA: Diagnosis not present

## 2017-03-16 DIAGNOSIS — S29012A Strain of muscle and tendon of back wall of thorax, initial encounter: Secondary | ICD-10-CM | POA: Diagnosis not present

## 2017-03-16 MED ORDER — CYCLOBENZAPRINE HCL 5 MG PO TABS: 5.0000 mg | ORAL_TABLET | Freq: Every evening | ORAL | 0 refills | Status: DC | PRN

## 2017-03-16 NOTE — Progress Notes (Signed)
Subjective:    Patient ID: Janet Chapman, female    DOB: 05-17-1935, 81 y.o.   MRN: [id number]  HPI   81 year old female with osteopenia, DM, chronic low back pain presents with new onset shoulder blade pain.  In last week, she has started with right shoulder balde pain, right neck stiffness.   Presents constantly.  Feels like pressure on right side of face/congestion. She has had bad allergies.. Using allergy meds. No ear pain.  No numbness or weakness in arm.   Nothing makes it worse. Tylenol helps with pain.    Went to [employer], [employer] food. Did not help.  No falls prior  Blood pressure 120/70, pulse (!) 52, temperature 97.6 F (36.4 C), temperature source Oral, height 5\' 2"  (1.575 m), weight 159 lb 8 oz (72.3 kg).   Review of Systems  Constitutional: Negative for fatigue and fever.  HENT: Negative for congestion.   Eyes: Negative for pain.  Respiratory: Negative for cough and shortness of breath.   Cardiovascular: Negative for chest pain, palpitations and leg swelling.  Gastrointestinal: Negative for abdominal pain.  Genitourinary: Negative for dysuria and vaginal bleeding.  Musculoskeletal: Negative for back pain.  Neurological: Negative for syncope, light-headedness and headaches.  Psychiatric/Behavioral: Negative for dysphoric mood.       Objective:   Physical Exam  Constitutional: Vital signs are normal. She appears well-developed and well-nourished. She is cooperative.  Non-toxic appearance. She does not appear ill. No distress.  HENT:  Head: Normocephalic.  Right Ear: Hearing, tympanic membrane, external ear and ear canal normal. Tympanic membrane is not erythematous, not retracted and not bulging.  Left Ear: Hearing, tympanic membrane, external ear and ear canal normal. Tympanic membrane is not erythematous, not retracted and not bulging.  Nose: Mucosal edema and rhinorrhea present. Right sinus exhibits no maxillary sinus tenderness and no frontal sinus tenderness.  Left sinus exhibits no maxillary sinus tenderness and no frontal sinus tenderness.  Mouth/Throat: Uvula is midline, oropharynx is clear and moist and mucous membranes are normal.  Eyes: Pupils are equal, round, and reactive to light. Conjunctivae, EOM and lids are normal. Lids are everted and swept, no foreign bodies found.  Neck: Trachea normal and normal range of motion. Neck supple. Carotid bruit is not present. No thyroid mass and no thyromegaly present.  Cardiovascular: Normal rate, regular rhythm, S1 normal, S2 normal, normal heart sounds, intact distal pulses and normal pulses.  Exam reveals no gallop and no friction rub.   No murmur heard. Pulmonary/Chest: Effort normal and breath sounds normal. No tachypnea. No respiratory distress. She has no decreased breath sounds. She has no wheezes. She has no rhonchi. She has no rales.  Abdominal: Soft. Normal appearance and bowel sounds are normal. There is no tenderness.  Musculoskeletal:       Right shoulder: She exhibits normal range of motion, no tenderness and no bony tenderness.       Cervical back: She exhibits no tenderness and no bony tenderness.       Back:  Area of ttp  neg spurling  no shoulder pain and full shoulder ROM, no pain with int and ext rotation of shoulder  neg drop arm and neers  Neurological: She is alert.  Skin: Skin is warm, dry and intact. No rash noted.  Psychiatric: Her speech is normal and behavior is normal. Judgment and thought content normal. Her mood appears not anxious. Cognition and memory are normal. She does not exhibit a depressed mood.  Assessment & Plan:

## 2017-03-16 NOTE — Assessment & Plan Note (Signed)
Heat, NSAID, massage, home PT info given.  Muscle relaxant low dose prn.

## 2017-03-16 NOTE — Assessment & Plan Note (Signed)
Add nasal steroid to antihistamine.

## 2017-03-16 NOTE — Patient Instructions (Signed)
Start flonase 2 sprays per nostril daily for congestion in addition to  Allergy medication.  Start heat , massage and gentle stretching of upper back/neck.  Start ibuprofen as needed for pain and inflammation.  If need you can use muscle relaxation at bedtime.

## 2017-03-22 DIAGNOSIS — M9903 Segmental and somatic dysfunction of lumbar region: Secondary | ICD-10-CM | POA: Diagnosis not present

## 2017-03-22 DIAGNOSIS — M5136 Other intervertebral disc degeneration, lumbar region: Secondary | ICD-10-CM | POA: Diagnosis not present

## 2017-03-22 DIAGNOSIS — M9905 Segmental and somatic dysfunction of pelvic region: Secondary | ICD-10-CM | POA: Diagnosis not present

## 2017-03-22 DIAGNOSIS — M9904 Segmental and somatic dysfunction of sacral region: Secondary | ICD-10-CM | POA: Diagnosis not present

## 2017-04-02 ENCOUNTER — Ambulatory Visit: Payer: PPO | Admitting: Internal Medicine

## 2017-04-02 ENCOUNTER — Encounter: Payer: Self-pay | Admitting: Internal Medicine

## 2017-04-02 VITALS — BP 130/84 | HR 52 | Temp 98.2°F | Resp 12 | Wt 163.0 lb

## 2017-04-02 DIAGNOSIS — J0110 Acute frontal sinusitis, unspecified: Secondary | ICD-10-CM | POA: Diagnosis not present

## 2017-04-02 MED ORDER — AMOXICILLIN 500 MG PO TABS: 1000.0000 mg | ORAL_TABLET | Freq: Two times a day (BID) | ORAL | 0 refills | Status: AC

## 2017-04-02 NOTE — Assessment & Plan Note (Signed)
Symptoms suggestive of secondary bacterial infection Will try amoxil tylenol

## 2017-04-02 NOTE — Progress Notes (Signed)
Subjective:    Patient ID: Janet Chapman, female    DOB: 11-03-1935, 81 y.o.   MRN: [id number]  HPI Here due to respiratory symptoms  Started with cough and raw feeling in throat a week ago Thinks she got it from her husband Seemed to have no energy and then improved In past couple of days--has worsened More cough--mucus in throat Trouble sleeping from this Now seems to be back in chest---though head is still congested Drainage feels more like it is from head  Using mucinex, ibuprofen, cough drops--??not clearly helpful  Now even more congestion in chest this morning No fever No chills or sweats Some SOB now  Current Outpatient Medications on File Prior to Visit  Medication Sig Dispense Refill  . aspirin 81 MG tablet Take 81 mg by mouth daily.      . cyclobenzaprine (FLEXERIL) 5 MG tablet Take 1 tablet (5 mg total) by mouth at bedtime as needed for muscle spasms. 10 tablet 0  . diltiazem (CARDIZEM) 120 MG tablet Take 1 tablet (120 mg total) by mouth daily. 90 tablet 3  . glucose blood test strip Use as instructed to check blood sugar once a day     . losartan-hydrochlorothiazide (HYZAAR) 100-25 MG tablet Take 1 tablet by mouth daily. 90 tablet 3  . metoprolol succinate (TOPROL-XL) 50 MG 24 hr tablet Take 1 tablet (50 mg total) by mouth daily. 90 tablet 3  . simvastatin (ZOCOR) 40 MG tablet TAKE 1 TABLET BY MOUTH AT BEDTIME 90 tablet 1   No current facility-administered medications on file prior to visit.     Allergies  Allergen Reactions  . Codeine Nausea Only    Past Medical History:  Diagnosis Date  . Allergic rhinitis   . Diabetes mellitus without complication (HCC)    diet controlled  . GERD (gastroesophageal reflux disease)   . Hyperlipidemia   . Hypertension   . Osteoarthritis   . Urinary incontinence     Past Surgical History:  Procedure Laterality Date  . ABDOMINAL HYSTERECTOMY  1993   TA  . CARDIOVASCULAR STRESS TEST  2004   H. Smith,  cardiolite-normal  . CHOLECYSTECTOMY  [phone]  . COLONOSCOPY      Family History  Problem Relation Age of Onset  . Heart attack Father 52  . Dementia Mother   . Stroke Mother 37  . Ovarian cancer Sister 42  . Diabetes Unknown        Maternal side  . Ovarian cancer Unknown        Grandmother  . Ovarian cancer Unknown        aunt  . Colon cancer Neg Hx     Social History   Socioeconomic History  . Marital status: Married    Spouse name: Not on file  . Number of children: 0  . Years of education: Not on file  . Highest education level: Not on file  Social Needs  . Financial resource strain: Not on file  . Food insecurity - worry: Not on file  . Food insecurity - inability: Not on file  . Transportation needs - medical: Not on file  . Transportation needs - non-medical: Not on file  Occupational History  . Occupation: retired  Tobacco Use  . Smoking status: Never Smoker  . Smokeless tobacco: Never Used  Substance and Sexual Activity  . Alcohol use: Yes    Comment: rarely  . Drug use: No  . Sexual activity: No  Other Topics Concern  .  Not on file  Social History Narrative    Regular exercise: yes, Curves 2x a week   Married 48 + years    Diet: fruit and veggies, no FF, some water, artificial sweetener   HCPOA: [name], has living will, full code ( reviewed 2014)            Review of Systems No rash No N/V Appetite is okay No diarrhea    Objective:   Physical Exam  Constitutional: She appears well-developed. No distress.  HENT:  Mild frontal tenderness TMs normal Moderate nasal congestion No pharyngeal injection  Neck: No thyromegaly present.  Pulmonary/Chest: Effort normal and breath sounds normal. No respiratory distress. She has no wheezes. She has no rales.  Lymphadenopathy:    She has no cervical adenopathy.          Assessment & Plan:

## 2017-04-10 ENCOUNTER — Ambulatory Visit: Payer: PPO | Admitting: Family Medicine

## 2017-04-10 ENCOUNTER — Encounter: Payer: Self-pay | Admitting: Family Medicine

## 2017-04-10 VITALS — BP 118/60 | HR 54 | Temp 98.0°F | Wt 162.0 lb

## 2017-04-10 DIAGNOSIS — J309 Allergic rhinitis, unspecified: Secondary | ICD-10-CM

## 2017-04-10 MED ORDER — FLUTICASONE PROPIONATE 50 MCG/ACT NA SUSP: 2.0000 | Freq: Every day | NASAL | 6 refills | Status: DC

## 2017-04-10 MED ORDER — FEXOFENADINE HCL 180 MG PO TABS: 180.0000 mg | ORAL_TABLET | Freq: Every day | ORAL | 3 refills | Status: DC

## 2017-04-10 NOTE — Progress Notes (Signed)
BP 118/60 (BP Location: Left Arm, Patient Position: Sitting, Cuff Size: Normal)   Pulse (!) 54   Temp 98 F (36.7 C) (Oral)   Wt 162 lb (73.5 kg)   SpO2 97%   BMI 29.63 kg/m    CC: persistent sinusitis Subjective:    Patient ID: Janet Chapman, female    DOB: 01-10-1936, 81 y.o.   MRN: [id number]  HPI: Janet Chapman is a 81 y.o. female presenting on 04/10/2017 for Sinus Problem (Sinus infection not improving. Seen 04/02/17. Says drainage in head is worse. Thinks it is allergies)   Seen by Dr Silvio Pate 8 days ago, note reviewed - treated with 2000mg  amox bid 7d course. She did feel better with this, but over the last few days has developed hoarseness, facial fullness, and mild headache. Blowing out clear mucous. Main concern is constant throat clearing.   No fevers/chills, ear or tooth pain, cough or dyspnea.  Perennial allergies treats with loratadine.   Relevant past medical, surgical, family and social history reviewed and updated as indicated. Interim medical history since our last visit reviewed. Allergies and medications reviewed and updated. Outpatient Medications Prior to Visit  Medication Sig Dispense Refill  . aspirin 81 MG tablet Take 81 mg by mouth daily.      . cyclobenzaprine (FLEXERIL) 5 MG tablet Take 1 tablet (5 mg total) by mouth at bedtime as needed for muscle spasms. 10 tablet 0  . diltiazem (CARDIZEM) 120 MG tablet Take 1 tablet (120 mg total) by mouth daily. 90 tablet 3  . losartan-hydrochlorothiazide (HYZAAR) 100-25 MG tablet Take 1 tablet by mouth daily. 90 tablet 3  . metoprolol succinate (TOPROL-XL) 50 MG 24 hr tablet Take 1 tablet (50 mg total) by mouth daily. 90 tablet 3  . simvastatin (ZOCOR) 40 MG tablet TAKE 1 TABLET BY MOUTH AT BEDTIME 90 tablet 1  . glucose blood test strip Use as instructed to check blood sugar once a day      No facility-administered medications prior to visit.      Per HPI unless specifically indicated in ROS section  below Review of Systems     Objective:    BP 118/60 (BP Location: Left Arm, Patient Position: Sitting, Cuff Size: Normal)   Pulse (!) 54   Temp 98 F (36.7 C) (Oral)   Wt 162 lb (73.5 kg)   SpO2 97%   BMI 29.63 kg/m   Wt Readings from Last 3 Encounters:  04/10/17 162 lb (73.5 kg)  04/02/17 163 lb (73.9 kg)  03/16/17 159 lb 8 oz (72.3 kg)    Physical Exam  Constitutional: She appears well-developed and well-nourished. No distress.  HENT:  Head: Normocephalic and atraumatic.  Right Ear: Hearing, tympanic membrane, external ear and ear canal normal.  Left Ear: Hearing, tympanic membrane, external ear and ear canal normal.  Nose: Mucosal edema and rhinorrhea present. Right sinus exhibits no maxillary sinus tenderness and no frontal sinus tenderness. Left sinus exhibits no maxillary sinus tenderness and no frontal sinus tenderness.  Mouth/Throat: Uvula is midline, oropharynx is clear and moist and mucous membranes are normal. No oropharyngeal exudate, posterior oropharyngeal edema, posterior oropharyngeal erythema or tonsillar abscesses.  Eyes: Conjunctivae and EOM are normal. Pupils are equal, round, and reactive to light. No scleral icterus.  Neck: Normal range of motion. Neck supple.  Cardiovascular: Normal rate, regular rhythm, normal heart sounds and intact distal pulses.  No murmur heard. Pulmonary/Chest: Effort normal and breath sounds normal. No respiratory distress. She  has no wheezes. She has no rales.  Lymphadenopathy:    She has no cervical adenopathy.  Skin: Skin is warm and dry. No rash noted.  Nursing note and vitals reviewed.     Assessment & Plan:   Problem List Items Addressed This Visit    Allergic rhinitis - Primary    Not consistent with ongoing bacterial infection at this time. She has been on loratadine regularly for the last 2 years - recommended change to fexofenadine (alegra), add nasal steroid, and add nasal saline irrigation. Discussed monitoring for  epistaxis. Update if not improved with treatment. Pt agrees with plan.           Follow up plan: Return if symptoms worsen or fail to improve.  Ria Bush, MD

## 2017-04-10 NOTE — Patient Instructions (Addendum)
I don't think there's persistent bacterial infection but rather persistent inflammation from recent sinusitis coupled with allergic rhinitis. Treat with nasal saline irrigation throughout the day.  Change loratadine to fexofenadine (alegra) 180mg  once daily. Trial nasal steroid - flonase sent to pharmacy 2 sprays in each nostril once daily. Watch for nosebleeds.

## 2017-04-10 NOTE — Assessment & Plan Note (Signed)
Not consistent with ongoing bacterial infection at this time. She has been on loratadine regularly for the last 2 years - recommended change to fexofenadine (alegra), add nasal steroid, and add nasal saline irrigation. Discussed monitoring for epistaxis. Update if not improved with treatment. Pt agrees with plan.

## 2017-05-21 DIAGNOSIS — M5136 Other intervertebral disc degeneration, lumbar region: Secondary | ICD-10-CM | POA: Diagnosis not present

## 2017-05-21 DIAGNOSIS — M9904 Segmental and somatic dysfunction of sacral region: Secondary | ICD-10-CM | POA: Diagnosis not present

## 2017-05-21 DIAGNOSIS — M9903 Segmental and somatic dysfunction of lumbar region: Secondary | ICD-10-CM | POA: Diagnosis not present

## 2017-05-21 DIAGNOSIS — M9905 Segmental and somatic dysfunction of pelvic region: Secondary | ICD-10-CM | POA: Diagnosis not present

## 2017-05-25 DIAGNOSIS — M9904 Segmental and somatic dysfunction of sacral region: Secondary | ICD-10-CM | POA: Diagnosis not present

## 2017-05-25 DIAGNOSIS — M9905 Segmental and somatic dysfunction of pelvic region: Secondary | ICD-10-CM | POA: Diagnosis not present

## 2017-05-25 DIAGNOSIS — M5136 Other intervertebral disc degeneration, lumbar region: Secondary | ICD-10-CM | POA: Diagnosis not present

## 2017-05-25 DIAGNOSIS — M9903 Segmental and somatic dysfunction of lumbar region: Secondary | ICD-10-CM | POA: Diagnosis not present

## 2017-06-07 ENCOUNTER — Other Ambulatory Visit (INDEPENDENT_AMBULATORY_CARE_PROVIDER_SITE_OTHER): Payer: PPO

## 2017-06-07 ENCOUNTER — Telehealth: Payer: Self-pay | Admitting: Family Medicine

## 2017-06-07 DIAGNOSIS — E119 Type 2 diabetes mellitus without complications: Secondary | ICD-10-CM | POA: Diagnosis not present

## 2017-06-07 LAB — COMPREHENSIVE METABOLIC PANEL
ALT: 15 U/L (ref 0–35)
AST: 17 U/L (ref 0–37)
Albumin: 4.4 g/dL (ref 3.5–5.2)
Alkaline Phosphatase: 55 U/L (ref 39–117)
BUN: 15 mg/dL (ref 6–23)
CO2: 38 mEq/L — ABNORMAL HIGH (ref 19–32)
Calcium: 10.2 mg/dL (ref 8.4–10.5)
Chloride: 97 mEq/L (ref 96–112)
Creatinine, Ser: 0.74 mg/dL (ref 0.40–1.20)
GFR: 80.37 mL/min (ref >60.00)
Glucose, Bld: 107 mg/dL — ABNORMAL HIGH (ref 70–99)
Potassium: 3.4 mEq/L — ABNORMAL LOW (ref 3.5–5.1)
Sodium: 140 mEq/L (ref 135–145)
Total Bilirubin: 0.7 mg/dL (ref 0.2–1.2)
Total Protein: 7.1 g/dL (ref 6.0–8.3)

## 2017-06-07 LAB — LIPID PANEL
Cholesterol: 176 mg/dL (ref 0–200)
HDL: 77.5 mg/dL (ref >39.00)
LDL Cholesterol: 87 mg/dL (ref 0–99)
NonHDL: 98.42
Total CHOL/HDL Ratio: 2
Triglycerides: 58 mg/dL (ref 0.0–149.0)
VLDL: 11.6 mg/dL (ref 0.0–40.0)

## 2017-06-07 LAB — HEMOGLOBIN A1C: Hgb A1c MFr Bld: 6.5 % (ref 4.6–6.5)

## 2017-06-07 NOTE — Telephone Encounter (Signed)
-----   Message from Ellamae Sia sent at 05/28/2017  2:47 PM EST ----- Regarding: Lab orders for Thursday, 1.24.19 Lab orders for a 6 month follow up appt

## 2017-06-08 DIAGNOSIS — M9905 Segmental and somatic dysfunction of pelvic region: Secondary | ICD-10-CM | POA: Diagnosis not present

## 2017-06-08 DIAGNOSIS — M9903 Segmental and somatic dysfunction of lumbar region: Secondary | ICD-10-CM | POA: Diagnosis not present

## 2017-06-08 DIAGNOSIS — M5136 Other intervertebral disc degeneration, lumbar region: Secondary | ICD-10-CM | POA: Diagnosis not present

## 2017-06-08 DIAGNOSIS — M9904 Segmental and somatic dysfunction of sacral region: Secondary | ICD-10-CM | POA: Diagnosis not present

## 2017-06-12 ENCOUNTER — Ambulatory Visit (INDEPENDENT_AMBULATORY_CARE_PROVIDER_SITE_OTHER): Payer: PPO | Admitting: Family Medicine

## 2017-06-12 ENCOUNTER — Encounter: Payer: Self-pay | Admitting: Family Medicine

## 2017-06-12 ENCOUNTER — Other Ambulatory Visit: Payer: Self-pay

## 2017-06-12 VITALS — BP 143/65 | HR 53 | Temp 98.4°F | Ht 62.0 in | Wt 163.5 lb

## 2017-06-12 DIAGNOSIS — E119 Type 2 diabetes mellitus without complications: Secondary | ICD-10-CM | POA: Diagnosis not present

## 2017-06-12 DIAGNOSIS — R002 Palpitations: Secondary | ICD-10-CM | POA: Diagnosis not present

## 2017-06-12 DIAGNOSIS — E7800 Pure hypercholesterolemia, unspecified: Secondary | ICD-10-CM

## 2017-06-12 DIAGNOSIS — R001 Bradycardia, unspecified: Secondary | ICD-10-CM | POA: Diagnosis not present

## 2017-06-12 DIAGNOSIS — I10 Essential (primary) hypertension: Secondary | ICD-10-CM

## 2017-06-12 LAB — HM DIABETES FOOT EXAM

## 2017-06-12 NOTE — Assessment & Plan Note (Signed)
Good control with diet. Encouraged exercise, weight loss, healthy eating habits.  

## 2017-06-12 NOTE — Assessment & Plan Note (Signed)
Well controlled. Continue current medication.  

## 2017-06-12 NOTE — Patient Instructions (Addendum)
Can try to increase miralax to every other day.. To see if will help emptying completely.   Increase potassium in diet.  Reduce caffeine as able.   Hypokalemia Hypokalemia means that the amount of potassium in the blood is lower than normal.Potassium is a chemical that helps regulate the amount of fluid in the body (electrolyte). It also stimulates muscle tightening (contraction) and helps nerves work properly.Normally, most of the body's potassium is inside of cells, and only a very small amount is in the blood. Because the amount in the blood is so small, minor changes to potassium levels in the blood can be life-threatening. What are the causes? This condition may be caused by:  Antibiotic medicine.  Diarrhea or vomiting. Taking too much of a medicine that helps you have a bowel movement (laxative) can cause diarrhea and lead to hypokalemia.  Chronic kidney disease (CKD).  Medicines that help the body get rid of excess fluid (diuretics).  Eating disorders, such as bulimia.  Low magnesium levels in the body.  Sweating a lot.  What are the signs or symptoms? Symptoms of this condition include:  Weakness.  Constipation.  Fatigue.  Muscle cramps.  Mental confusion.  Skipped heartbeats or irregular heartbeat (palpitations).  Tingling or numbness.  How is this diagnosed? This condition is diagnosed with a blood test. How is this treated? Hypokalemia can be treated by taking potassium supplements by mouth or adjusting the medicines that you take. Treatment may also include eating more foods that contain a lot of potassium. If your potassium level is very low, you may need to get potassium through an IV tube in one of your veins and be monitored in the hospital. Follow these instructions at home:  Take over-the-counter and prescription medicines only as told by your health care provider. This includes vitamins and supplements.  Eat a healthy diet. A healthy diet  includes fresh fruits and vegetables, whole grains, healthy fats, and lean proteins.  If instructed, eat more foods that contain a lot of potassium, such as: ? Nuts, such as peanuts and pistachios. ? Seeds, such as sunflower seeds and pumpkin seeds. ? Peas, lentils, and lima beans. ? Whole grain and bran cereals and breads. ? Fresh fruits and vegetables, such as apricots, avocado, bananas, cantaloupe, kiwi, oranges, tomatoes, asparagus, and potatoes. ? Orange juice. ? Tomato juice. ? Red meats. ? Yogurt.  Keep all follow-up visits as told by your health care provider. This is important. Contact a health care provider if:  You have weakness that gets worse.  You feel your heart pounding or racing.  You vomit.  You have diarrhea.  You have diabetes (diabetes mellitus) and you have trouble keeping your blood sugar (glucose) in your target range. Get help right away if:  You have chest pain.  You have shortness of breath.  You have vomiting or diarrhea that lasts for more than 2 days.  You faint. This information is not intended to replace advice given to you by your health care provider. Make sure you discuss any questions you have with your health care provider. Document Released: 05/01/2005 Document Revised: 12/18/2015 Document Reviewed: 12/18/2015 Elsevier Interactive Patient Education  2018 [employer].

## 2017-06-12 NOTE — Assessment & Plan Note (Signed)
EKG nml except bradycardia today.  TSH and CBC , CMET nml in last 6 months. Pt is assymptomatic with palpitations. Decrease caffeine. ? Tachy brady syndrome.  May need cards referral for Holter if persistent.

## 2017-06-12 NOTE — Progress Notes (Signed)
Subjective:    Patient ID: Janet Chapman, female    DOB: 03-19-1936, 82 y.o.   MRN: [id number]  HPI   82 year old female presents for DM follow up. On 05/21/2017 following sinus viral infection... She had nose bleed.  No further episodes.Janet Chapman has helped with mucus.  No fever.  She has been  Having some issue emptying completely.. Tends toward constipation.  She has noted increase in heart racing/beating harder once a week lasting a few seconds. Noted in last year. No associated dizziness, SOB, chest pain.  Nml cbc and thyroid in 2018.  She drinks 2-4 cups of coffee each AM. Diabetes:   Good control  With diet. Lab Results  Component Value Date   HGBA1C 6.5 06/07/2017  Using medications without difficulties: Hypoglycemic episodes: Hyperglycemic episodes: Feet problems: Blood Sugars averaging: eye exam within last year:  Hypertension:    Borderline control on  diltiazed losartan HCTZ, metoprolol. BP Readings from Last 3 Encounters:  06/12/17 (!) 143/65  04/10/17 118/60  04/02/17 130/84  Using medication without problems or lightheadedness:  none Chest pain with exertion: none Edema: none Short of breath: none Average home BPs: Other issues:  Elevated Cholesterol:  Good control on simvastatin Lab Results  Component Value Date   CHOL 176 06/07/2017   HDL 77.50 06/07/2017   LDLCALC 87 06/07/2017   LDLDIRECT 119.8 04/26/2009   TRIG 58.0 06/07/2017   CHOLHDL 2 06/07/2017  Using medications without problems: Muscle aches:  Diet compliance: moderate over holidays Exercise: walking some Other complaints:   Review of Systems  Constitutional: Positive for fatigue. Negative for fever.  HENT: Negative for congestion.   Eyes: Negative for pain.  Respiratory: Negative for cough and shortness of breath.   Cardiovascular: Positive for palpitations. Negative for chest pain and leg swelling.  Gastrointestinal: Negative for abdominal pain.  Genitourinary: Negative for  dysuria and vaginal bleeding.  Musculoskeletal: Negative for back pain.  Neurological: Negative for syncope, light-headedness and headaches.  Psychiatric/Behavioral: Negative for dysphoric mood.       Objective:   Physical Exam  Constitutional: Vital signs are normal. She appears well-developed and well-nourished. She is cooperative.  Non-toxic appearance. She does not appear ill. No distress.  HENT:  Head: Normocephalic.  Right Ear: Hearing, tympanic membrane, external ear and ear canal normal. Tympanic membrane is not erythematous, not retracted and not bulging.  Left Ear: Hearing, tympanic membrane, external ear and ear canal normal. Tympanic membrane is not erythematous, not retracted and not bulging.  Nose: No mucosal edema or rhinorrhea. Right sinus exhibits no maxillary sinus tenderness and no frontal sinus tenderness. Left sinus exhibits no maxillary sinus tenderness and no frontal sinus tenderness.  Mouth/Throat: Uvula is midline, oropharynx is clear and moist and mucous membranes are normal.  Eyes: Conjunctivae, EOM and lids are normal. Pupils are equal, round, and reactive to light. Lids are everted and swept, no foreign bodies found.  Neck: Trachea normal and normal range of motion. Neck supple. Carotid bruit is not present. No thyroid mass and no thyromegaly present.  Cardiovascular: Normal rate, regular rhythm, S1 normal, S2 normal, normal heart sounds, intact distal pulses and normal pulses. Exam reveals no gallop and no friction rub.  No murmur heard. Pulmonary/Chest: Effort normal and breath sounds normal. No tachypnea. No respiratory distress. She has no decreased breath sounds. She has no wheezes. She has no rhonchi. She has no rales.  Abdominal: Soft. Normal appearance and bowel sounds are normal. There is no tenderness.  Neurological: She is alert.  Skin: Skin is warm, dry and intact. No rash noted.  Psychiatric: Her speech is normal and behavior is normal. Judgment and  thought content normal. Her mood appears not anxious. Cognition and memory are normal. She does not exhibit a depressed mood.      Diabetic foot exam: Normal inspection No skin breakdown No calluses  Normal DP pulses Normal sensation to light touch and monofilament Nails normal     Assessment & Plan:

## 2017-06-12 NOTE — Assessment & Plan Note (Signed)
Likely due to medication, but hesitate to reduce given recent palpitations.. ? Tachy brady syndrome.  May need cards referral for Holter if persistent.

## 2017-08-09 DIAGNOSIS — M9905 Segmental and somatic dysfunction of pelvic region: Secondary | ICD-10-CM | POA: Diagnosis not present

## 2017-08-09 DIAGNOSIS — M5136 Other intervertebral disc degeneration, lumbar region: Secondary | ICD-10-CM | POA: Diagnosis not present

## 2017-08-09 DIAGNOSIS — M9904 Segmental and somatic dysfunction of sacral region: Secondary | ICD-10-CM | POA: Diagnosis not present

## 2017-08-09 DIAGNOSIS — M9903 Segmental and somatic dysfunction of lumbar region: Secondary | ICD-10-CM | POA: Diagnosis not present

## 2017-08-10 ENCOUNTER — Other Ambulatory Visit: Payer: Self-pay | Admitting: Family Medicine

## 2017-08-10 DIAGNOSIS — M9903 Segmental and somatic dysfunction of lumbar region: Secondary | ICD-10-CM | POA: Diagnosis not present

## 2017-08-10 DIAGNOSIS — M9905 Segmental and somatic dysfunction of pelvic region: Secondary | ICD-10-CM | POA: Diagnosis not present

## 2017-08-10 DIAGNOSIS — M9904 Segmental and somatic dysfunction of sacral region: Secondary | ICD-10-CM | POA: Diagnosis not present

## 2017-08-10 DIAGNOSIS — M5136 Other intervertebral disc degeneration, lumbar region: Secondary | ICD-10-CM | POA: Diagnosis not present

## 2017-08-28 DIAGNOSIS — M9903 Segmental and somatic dysfunction of lumbar region: Secondary | ICD-10-CM | POA: Diagnosis not present

## 2017-08-28 DIAGNOSIS — M9905 Segmental and somatic dysfunction of pelvic region: Secondary | ICD-10-CM | POA: Diagnosis not present

## 2017-08-28 DIAGNOSIS — M5136 Other intervertebral disc degeneration, lumbar region: Secondary | ICD-10-CM | POA: Diagnosis not present

## 2017-08-28 DIAGNOSIS — M9904 Segmental and somatic dysfunction of sacral region: Secondary | ICD-10-CM | POA: Diagnosis not present

## 2017-09-05 ENCOUNTER — Encounter: Payer: Self-pay | Admitting: Primary Care

## 2017-09-05 ENCOUNTER — Ambulatory Visit (INDEPENDENT_AMBULATORY_CARE_PROVIDER_SITE_OTHER): Payer: PPO | Admitting: Primary Care

## 2017-09-05 VITALS — BP 162/64 | HR 65 | Temp 98.0°F | Ht 62.0 in | Wt 160.5 lb

## 2017-09-05 DIAGNOSIS — M25511 Pain in right shoulder: Secondary | ICD-10-CM

## 2017-09-05 MED ORDER — NAPROXEN 500 MG PO TABS: 500.0000 mg | ORAL_TABLET | Freq: Two times a day (BID) | ORAL | 0 refills | Status: DC

## 2017-09-05 NOTE — Progress Notes (Signed)
Subjective:    Patient ID: Janet Chapman, female    DOB: [birth year], 82 y.o.   MRN: [id number]  HPI  Janet Chapman is a 82 year old female with a history of osteoarthritis, hypertension, type 2 diabetes who presents today with a chief complaint of shoulder pain.   Located to the right shoulder that began 5 days ago with radiation to upper extremity and right lateral neck. She's been taking a few doses of Tylenol at bedtime, CBD oil chews, pain relief cream without much improvement. She denies trauma, injury, weakness, numbness/tingling.    Review of Systems  Musculoskeletal: Positive for arthralgias.       Right shoulder and upper extremity pain  Skin: Negative for color change.       Past Medical History:  Diagnosis Date  . Allergic rhinitis   . Diabetes mellitus without complication (HCC)    diet controlled  . GERD (gastroesophageal reflux disease)   . Hyperlipidemia   . Hypertension   . Osteoarthritis   . Urinary incontinence      Social History   Socioeconomic History  . Marital status: Married    Spouse name: Not on file  . Number of children: 0  . Years of education: Not on file  . Highest education level: Not on file  Occupational History  . Occupation: retired  Scientific laboratory technician  . Financial resource strain: Not on file  . Food insecurity:    Worry: Not on file    Inability: Not on file  . Transportation needs:    Medical: Not on file    Non-medical: Not on file  Tobacco Use  . Smoking status: Never Smoker  . Smokeless tobacco: Never Used  Substance and Sexual Activity  . Alcohol use: Yes    Comment: rarely  . Drug use: No  . Sexual activity: Never  Lifestyle  . Physical activity:    Days per week: Not on file    Minutes per session: Not on file  . Stress: Not on file  Relationships  . Social connections:    Talks on phone: Not on file    Gets together: Not on file    Attends religious service: Not on file    Active member of club or organization:  Not on file    Attends meetings of clubs or organizations: Not on file    Relationship status: Not on file  . Intimate partner violence:    Fear of current or ex partner: Not on file    Emotionally abused: Not on file    Physically abused: Not on file    Forced sexual activity: Not on file  Other Topics Concern  . Not on file  Social History Narrative    Regular exercise: yes, Curves 2x a week   Married 48 + years    Diet: fruit and veggies, no FF, some water, artificial sweetener   HCPOA: [name], has living will, full code ( reviewed 2014)             Past Surgical History:  Procedure Laterality Date  . ABDOMINAL HYSTERECTOMY  1993   TA  . CARDIOVASCULAR STRESS TEST  2004   [facility]. [facility], cardiolite-normal  . CHOLECYSTECTOMY  [phone]  . COLONOSCOPY      Family History  Problem Relation Age of Onset  . Heart attack Father 9  . Dementia Mother   . Stroke Mother 8  . Ovarian cancer Sister 57  . Diabetes Unknown  Maternal side  . Ovarian cancer Unknown        Grandmother  . Ovarian cancer Unknown        aunt  . Colon cancer Neg Hx     Allergies  Allergen Reactions  . Codeine Nausea Only    Current Outpatient Medications on File Prior to Visit  Medication Sig Dispense Refill  . aspirin 81 MG tablet Take 81 mg by mouth daily.      Marland Kitchen diltiazem (CARDIZEM) 120 MG tablet Take 1 tablet (120 mg total) by mouth daily. 90 tablet 3  . fexofenadine (ALLEGRA) 180 MG tablet Take 1 tablet (180 mg total) by mouth daily. 30 tablet 3  . losartan-hydrochlorothiazide (HYZAAR) 100-25 MG tablet Take 1 tablet by mouth daily. 90 tablet 3  . metoprolol succinate (TOPROL-XL) 50 MG 24 hr tablet Take 1 tablet (50 mg total) by mouth daily. 90 tablet 3  . simvastatin (ZOCOR) 40 MG tablet TAKE 1 TABLET BY MOUTH EACH NIGHT AT BEDTIME 90 tablet 3  . fluticasone (FLONASE) 50 MCG/ACT nasal spray Place 2 sprays into both nostrils daily. (Patient not taking: Reported on 09/05/2017) 16 g 6     No current facility-administered medications on file prior to visit.     BP (!) 162/64   Pulse 65   Temp 98 F (36.7 C) (Oral)   Ht 5\' 2"  (1.575 m)   Wt 160 lb 8 oz (72.8 kg)   SpO2 95%   BMI 29.36 kg/m    Objective:   Physical Exam  Constitutional: She appears well-nourished.  Neck: Neck supple.  Cardiovascular: Normal rate.  Pulses:      Radial pulses are 2+ on the right side, and 2+ on the left side.  Pulmonary/Chest: Effort normal.  Musculoskeletal:       Right shoulder: She exhibits decreased range of motion and pain. She exhibits no tenderness, no bony tenderness and no deformity.       Arms: Decrease in ROM with pain to lateral and posterior abduction. 5/5 strength to upper extremities bilaterally.   Skin: Skin is warm and dry.  Nursing note reviewed.         Assessment & Plan:  Acute Shoulder Pain:  Located to right shoulder with radiation to right upper extremity and right neck x 5 days. Exam today with decrease in ROM with pain. Suspect osteoarthritis flare with inflammation causing decrease in ROM with pain. Rx for Naproxen 500 mg BID x 7-10 days provided. Discussed heat/ice, stretching exercises.  Will have her follow up in 1 week if no improvement, consider imaging at that time.   Pleas Koch, NP

## 2017-09-05 NOTE — Progress Notes (Signed)
Subjective:    Patient ID: Janet Chapman, female    DOB: [birth year], 82 y.o.   MRN: [id number]  HPI Janet Chapman is a 82 y.o. female who presents today with a chief complaint of Right shoulder pain that radiates to upper arm and neck that started 5 days ago. Has taken APAP 650 Q6H, CBD chewies, and topical pain cream. Rates pain as 6/10 aching and 3/10 after APAP. Denies any falling, trauma, or vigorous exercise.   Review of Systems  Constitutional: Negative for activity change and fatigue.  Neurological: Negative for tremors, weakness and numbness.       Past Medical History:  Diagnosis Date  . Allergic rhinitis   . Diabetes mellitus without complication (HCC)    diet controlled  . GERD (gastroesophageal reflux disease)   . Hyperlipidemia   . Hypertension   . Osteoarthritis   . Urinary incontinence    Past Surgical History:  Procedure Laterality Date  . ABDOMINAL HYSTERECTOMY  1993   TA  . CARDIOVASCULAR STRESS TEST  2004   [facility]. [facility], cardiolite-normal  . CHOLECYSTECTOMY  [phone]  . COLONOSCOPY     Social History   Socioeconomic History  . Marital status: Married    Spouse name: Not on file  . Number of children: 0  . Years of education: Not on file  . Highest education level: Not on file  Occupational History  . Occupation: retired  Scientific laboratory technician  . Financial resource strain: Not on file  . Food insecurity:    Worry: Not on file    Inability: Not on file  . Transportation needs:    Medical: Not on file    Non-medical: Not on file  Tobacco Use  . Smoking status: Never Smoker  . Smokeless tobacco: Never Used  Substance and Sexual Activity  . Alcohol use: Yes    Comment: rarely  . Drug use: No  . Sexual activity: Never  Lifestyle  . Physical activity:    Days per week: Not on file    Minutes per session: Not on file  . Stress: Not on file  Relationships  . Social connections:    Talks on phone: Not on file    Gets together: Not on file    Attends  religious service: Not on file    Active member of club or organization: Not on file    Attends meetings of clubs or organizations: Not on file    Relationship status: Not on file  . Intimate partner violence:    Fear of current or ex partner: Not on file    Emotionally abused: Not on file    Physically abused: Not on file    Forced sexual activity: Not on file  Other Topics Concern  . Not on file  Social History Narrative    Regular exercise: yes, Curves 2x a week   Married 48 + years    Diet: fruit and veggies, no FF, some water, artificial sweetener   HCPOA: [name], has living will, full code ( reviewed 2014)            Family History  Problem Relation Age of Onset  . Heart attack Father 73  . Dementia Mother   . Stroke Mother 36  . Ovarian cancer Sister 33  . Diabetes Unknown        Maternal side  . Ovarian cancer Unknown        Grandmother  . Ovarian cancer Unknown  aunt  . Colon cancer Neg Hx    Current Outpatient Medications on File Prior to Visit  Medication Sig Dispense Refill  . aspirin 81 MG tablet Take 81 mg by mouth daily.      Marland Kitchen diltiazem (CARDIZEM) 120 MG tablet Take 1 tablet (120 mg total) by mouth daily. 90 tablet 3  . fexofenadine (ALLEGRA) 180 MG tablet Take 1 tablet (180 mg total) by mouth daily. 30 tablet 3  . losartan-hydrochlorothiazide (HYZAAR) 100-25 MG tablet Take 1 tablet by mouth daily. 90 tablet 3  . metoprolol succinate (TOPROL-XL) 50 MG 24 hr tablet Take 1 tablet (50 mg total) by mouth daily. 90 tablet 3  . simvastatin (ZOCOR) 40 MG tablet TAKE 1 TABLET BY MOUTH EACH NIGHT AT BEDTIME 90 tablet 3  . fluticasone (FLONASE) 50 MCG/ACT nasal spray Place 2 sprays into both nostrils daily. (Patient not taking: Reported on 09/05/2017) 16 g 6   No current facility-administered medications on file prior to visit.     Objective:   Physical Exam  Constitutional: She appears well-nourished. No distress.  Neck: Normal range of motion.  Neck supple.  Cardiovascular: Normal pulses.  Pulses:      Radial pulses are 2+ on the right side, and 2+ on the left side.  Musculoskeletal:       Right shoulder: She exhibits decreased range of motion and pain. She exhibits no bony tenderness, no swelling and normal pulse.   BP (!) 162/64   Pulse 65   Temp 98 F (36.7 C) (Oral)   Ht 5\' 2"  (1.575 m)   Wt 160 lb 8 oz (72.8 kg)   SpO2 95%   BMI 29.36 kg/m   BP Readings from Last 3 Encounters:  09/05/17 (!) 162/64  06/12/17 (!) 143/65  04/10/17 118/60      Assessment & Plan:   1. Acute pain of right shoulder - If not better in a week will plan on doing shouder X-Ray  - naproxen (NAPROSYN) 500 MG tablet; Take 1 tablet (500 mg total) by mouth 2 (two) times daily with a meal.  Dispense: 20 tablet; Refill: 0  Denita Lung, RN, Adult-Geriatric Nurse Practitioner Student

## 2017-09-05 NOTE — Patient Instructions (Addendum)
Your shoulder is probably from an arthritis flare. For your pain we have prescribed Naproxen 500mg  two times day for 7-10 days.  Try the stretching exercises below. Continue heat/ice for discomfort.   If your pain doesn't go away in the next week, please follow-up with Dr. [name].  It was a pleasure meeting you!    Shoulder Exercises Ask your health care provider which exercises are safe for you. Do exercises exactly as told by your health care provider and adjust them as directed. It is normal to feel mild stretching, pulling, tightness, or discomfort as you do these exercises, but you should stop right away if you feel sudden pain or your pain gets worse.Do not begin these exercises until told by your health care provider. RANGE OF MOTION EXERCISES These exercises warm up your muscles and joints and improve the movement and flexibility of your shoulder. These exercises also help to relieve pain, numbness, and tingling. These exercises involve stretching your injured shoulder directly. Exercise A: Pendulum  1. Stand near a wall or a surface that you can hold onto for balance. 2. Bend at the waist and let your left / right arm hang straight down. Use your other arm to support you. Keep your back straight and do not lock your knees. 3. Relax your left / right arm and shoulder muscles, and move your hips and your trunk so your left / right arm swings freely. Your arm should swing because of the motion of your body, not because you are using your arm or shoulder muscles. 4. Keep moving your body so your arm swings in the following directions, as told by your health care provider: ? Side to side. ? Forward and backward. ? In clockwise and counterclockwise circles. 5. Continue each motion for __________ seconds, or for as long as told by your health care provider. 6. Slowly return to the starting position. Repeat __________ times. Complete this exercise __________ times a day. Exercise  B:Flexion, Standing  1. Stand and hold a broomstick, a cane, or a similar object. Place your hands a little more than shoulder-width apart on the object. Your left / right hand should be palm-up, and your other hand should be palm-down. 2. Keep your elbow straight and keep your shoulder muscles relaxed. Push the stick down with your healthy arm to raise your left / right arm in front of your body, and then over your head until you feel a stretch in your shoulder. ? Avoid shrugging your shoulder while you raise your arm. Keep your shoulder blade tucked down toward the middle of your back. 3. Hold for __________ seconds. 4. Slowly return to the starting position. Repeat __________ times. Complete this exercise __________ times a day. Exercise C: Abduction, Standing 1. Stand and hold a broomstick, a cane, or a similar object. Place your hands a little more than shoulder-width apart on the object. Your left / right hand should be palm-up, and your other hand should be palm-down. 2. While keeping your elbow straight and your shoulder muscles relaxed, push the stick across your body toward your left / right side. Raise your left / right arm to the side of your body and then over your head until you feel a stretch in your shoulder. ? Do not raise your arm above shoulder height, unless your health care provider tells you to do that. ? Avoid shrugging your shoulder while you raise your arm. Keep your shoulder blade tucked down toward the middle of your back. 3.  Hold for __________ seconds. 4. Slowly return to the starting position. Repeat __________ times. Complete this exercise __________ times a day. Exercise D:Internal Rotation  1. Place your left / right hand behind your back, palm-up. 2. Use your other hand to dangle an exercise band, a towel, or a similar object over your shoulder. Grasp the band with your left / right hand so you are holding onto both ends. 3. Gently pull up on the band until you  feel a stretch in the front of your left / right shoulder. ? Avoid shrugging your shoulder while you raise your arm. Keep your shoulder blade tucked down toward the middle of your back. 4. Hold for __________ seconds. 5. Release the stretch by letting go of the band and lowering your hands. Repeat __________ times. Complete this exercise __________ times a day. STRETCHING EXERCISES These exercises warm up your muscles and joints and improve the movement and flexibility of your shoulder. These exercises also help to relieve pain, numbness, and tingling. These exercises are done using your healthy shoulder to help stretch the muscles of your injured shoulder. Exercise E: Warehouse manager (External Rotation and Abduction)  1. Stand in a doorway with one of your feet slightly in front of the other. This is called a staggered stance. If you cannot reach your forearms to the door frame, stand facing a corner of a room. 2. Choose one of the following positions as told by your health care provider: ? Place your hands and forearms on the door frame above your head. ? Place your hands and forearms on the door frame at the height of your head. ? Place your hands on the door frame at the height of your elbows. 3. Slowly move your weight onto your front foot until you feel a stretch across your chest and in the front of your shoulders. Keep your head and chest upright and keep your abdominal muscles tight. 4. Hold for __________ seconds. 5. To release the stretch, shift your weight to your back foot. Repeat __________ times. Complete this stretch __________ times a day. Exercise F:Extension, Standing 1. Stand and hold a broomstick, a cane, or a similar object behind your back. ? Your hands should be a little wider than shoulder-width apart. ? Your palms should face away from your back. 2. Keeping your elbows straight and keeping your shoulder muscles relaxed, move the stick away from your body until you feel  a stretch in your shoulder. ? Avoid shrugging your shoulders while you move the stick. Keep your shoulder blade tucked down toward the middle of your back. 3. Hold for __________ seconds. 4. Slowly return to the starting position. Repeat __________ times. Complete this exercise __________ times a day. STRENGTHENING EXERCISES These exercises build strength and endurance in your shoulder. Endurance is the ability to use your muscles for a long time, even after they get tired. Exercise G:External Rotation  1. Sit in a stable chair without armrests. 2. Secure an exercise band at elbow height on your left / right side. 3. Place a soft object, such as a folded towel or a small pillow, between your left / right upper arm and your body to move your elbow a few inches away (about 10 cm) from your side. 4. Hold the end of the band so it is tight and there is no slack. 5. Keeping your elbow pressed against the soft object, move your left / right forearm out, away from your abdomen. Keep your body steady so  only your forearm moves. 6. Hold for __________ seconds. 7. Slowly return to the starting position. Repeat __________ times. Complete this exercise __________ times a day. Exercise H:Shoulder Abduction  1. Sit in a stable chair without armrests, or stand. 2. Hold a __________ weight in your left / right hand, or hold an exercise band with both hands. 3. Start with your arms straight down and your left / right palm facing in, toward your body. 4. Slowly lift your left / right hand out to your side. Do not lift your hand above shoulder height unless your health care provider tells you that this is safe. ? Keep your arms straight. ? Avoid shrugging your shoulder while you do this movement. Keep your shoulder blade tucked down toward the middle of your back. 5. Hold for __________ seconds. 6. Slowly lower your arm, and return to the starting position. Repeat __________ times. Complete this exercise  __________ times a day. Exercise I:Shoulder Extension 1. Sit in a stable chair without armrests, or stand. 2. Secure an exercise band to a stable object in front of you where it is at shoulder height. 3. Hold one end of the exercise band in each hand. Your palms should face each other. 4. Straighten your elbows and lift your hands up to shoulder height. 5. Step back, away from the secured end of the exercise band, until the band is tight and there is no slack. 6. Squeeze your shoulder blades together as you pull your hands down to the sides of your thighs. Stop when your hands are straight down by your sides. Do not let your hands go behind your body. 7. Hold for __________ seconds. 8. Slowly return to the starting position. Repeat __________ times. Complete this exercise __________ times a day. Exercise J:Standing Shoulder Row 1. Sit in a stable chair without armrests, or stand. 2. Secure an exercise band to a stable object in front of you so it is at waist height. 3. Hold one end of the exercise band in each hand. Your palms should be in a thumbs-up position. 4. Bend each of your elbows to an "L" shape (about 90 degrees) and keep your upper arms at your sides. 5. Step back until the band is tight and there is no slack. 6. Slowly pull your elbows back behind you. 7. Hold for __________ seconds. 8. Slowly return to the starting position. Repeat __________ times. Complete this exercise __________ times a day. Exercise K:Shoulder Press-Ups  1. Sit in a stable chair that has armrests. Sit upright, with your feet flat on the floor. 2. Put your hands on the armrests so your elbows are bent and your fingers are pointing forward. Your hands should be about even with the sides of your body. 3. Push down on the armrests and use your arms to lift yourself off of the chair. Straighten your elbows and lift yourself up as much as you comfortably can. ? Move your shoulder blades down, and avoid  letting your shoulders move up toward your ears. ? Keep your feet on the ground. As you get stronger, your feet should support less of your body weight as you lift yourself up. 4. Hold for __________ seconds. 5. Slowly lower yourself back into the chair. Repeat __________ times. Complete this exercise __________ times a day. Exercise L: Wall Push-Ups  1. Stand so you are facing a stable wall. Your feet should be about one arm-length away from the wall. 2. Lean forward and place your palms on  the wall at shoulder height. 3. Keep your feet flat on the floor as you bend your elbows and lean forward toward the wall. 4. Hold for __________ seconds. 5. Straighten your elbows to push yourself back to the starting position. Repeat __________ times. Complete this exercise __________ times a day. This information is not intended to replace advice given to you by your health care provider. Make sure you discuss any questions you have with your health care provider. Document Released: 03/15/2005 Document Revised: 01/24/2016 Document Reviewed: 01/10/2015 Elsevier Interactive Patient Education  2018 [employer].   Naproxen and naproxen sodium oral immediate-release tablets What is this medicine? NAPROXEN (na PROX en) is a non-steroidal anti-inflammatory drug (NSAID). It is used to reduce swelling and to treat pain. This medicine may be used for dental pain, headache, or painful monthly periods. It is also used for painful joint and muscular problems such as arthritis, tendinitis, bursitis, and gout. This medicine may be used for other purposes; ask your health care provider or pharmacist if you have questions. COMMON BRAND NAME(S): Aflaxen, Aleve, Aleve Arthritis, All Day Relief, Anaprox, Anaprox DS, Naprosyn, [facility] Naproxen Sodium What should I tell my health care provider before I take this medicine? They need to know if you have any of these conditions: -asthma -cigarette smoker -drink more  than 3 alcohol containing drinks a day -heart disease or circulation problems such as heart failure or leg edema (fluid retention) -high blood pressure -kidney disease -liver disease -stomach bleeding or ulcers -an unusual or allergic reaction to naproxen, aspirin, other NSAIDs, other medicines, foods, dyes, or preservatives -pregnant or trying to get pregnant -breast-feeding How should I use this medicine? Take this medicine by mouth with a glass of water. Follow the directions on the prescription label. Take it with food if your stomach gets upset. Try to not lie down for at least 10 minutes after you take it. Take your medicine at regular intervals. Do not take your medicine more often than directed. Long-term, continuous use may increase the risk of heart attack or stroke. A special MedGuide will be given to you by the pharmacist with each prescription and refill. Be sure to read this information carefully each time. Talk to your pediatrician regarding the use of this medicine in children. Special care may be needed. Overdosage: If you think you have taken too much of this medicine contact a poison control center or emergency room at once. NOTE: This medicine is only for you. Do not share this medicine with others. What if I miss a dose? If you miss a dose, take it as soon as you can. If it is almost time for your next dose, take only that dose. Do not take double or extra doses. What may interact with this medicine? -alcohol -aspirin -cidofovir -diuretics -lithium -methotrexate -other drugs for inflammation like ketorolac or prednisone -pemetrexed -probenecid -warfarin This list may not describe all possible interactions. Give your health care provider a list of all the medicines, herbs, non-prescription drugs, or dietary supplements you use. Also tell them if you smoke, drink alcohol, or use illegal drugs. Some items may interact with your medicine. What should I watch for while  using this medicine? Tell your doctor or health care professional if your pain does not get better. Talk to your doctor before taking another medicine for pain. Do not treat yourself. This medicine does not prevent heart attack or stroke. In fact, this medicine may increase the chance of a heart attack or stroke.  The chance may increase with longer use of this medicine and in people who have heart disease. If you take aspirin to prevent heart attack or stroke, talk with your doctor or health care professional. Do not take other medicines that contain aspirin, ibuprofen, or naproxen with this medicine. Side effects such as stomach upset, nausea, or ulcers may be more likely to occur. Many medicines available without a prescription should not be taken with this medicine. This medicine can cause ulcers and bleeding in the stomach and intestines at any time during treatment. Do not smoke cigarettes or drink alcohol. These increase irritation to your stomach and can make it more susceptible to damage from this medicine. Ulcers and bleeding can happen without warning symptoms and can cause death. You may get drowsy or dizzy. Do not drive, use machinery, or do anything that needs mental alertness until you know how this medicine affects you. Do not stand or sit up quickly, especially if you are an older patient. This reduces the risk of dizzy or fainting spells. This medicine can cause you to bleed more easily. Try to avoid damage to your teeth and gums when you brush or floss your teeth. What side effects may I notice from receiving this medicine? Side effects that you should report to your doctor or health care professional as soon as possible: -black or bloody stools, blood in the urine or vomit -blurred vision -chest pain -difficulty breathing or wheezing -nausea or vomiting -severe stomach pain -skin rash, skin redness, blistering or peeling skin, hives, or itching -slurred speech or weakness on one  side of the body -swelling of eyelids, throat, lips -unexplained weight gain or swelling -unusually weak or tired -yellowing of eyes or skin Side effects that usually do not require medical attention (report to your doctor or health care professional if they continue or are bothersome): -constipation -headache -heartburn This list may not describe all possible side effects. Call your doctor for medical advice about side effects. You may report side effects to FDA at 1-800-FDA-1088. Where should I keep my medicine? Keep out of the reach of children. Store at room temperature between 15 and 30 degrees C (59 and 86 degrees F). Keep container tightly closed. Throw away any unused medicine after the expiration date. NOTE: This sheet is a summary. It may not cover all possible information. If you have questions about this medicine, talk to your doctor, pharmacist, or health care provider.  2018 Elsevier/Gold Standard (2009-05-03 20:10:16)

## 2017-09-12 ENCOUNTER — Ambulatory Visit (INDEPENDENT_AMBULATORY_CARE_PROVIDER_SITE_OTHER): Payer: PPO | Admitting: Family Medicine

## 2017-09-12 ENCOUNTER — Ambulatory Visit (INDEPENDENT_AMBULATORY_CARE_PROVIDER_SITE_OTHER)
Admission: RE | Admit: 2017-09-12 | Discharge: 2017-09-12 | Disposition: A | Discharge status: Home / Self Care | Payer: PPO | Source: Ambulatory Visit | Attending: Family Medicine | Admitting: Family Medicine

## 2017-09-12 VITALS — BP 140/70 | HR 52 | Temp 97.5°F | Ht 62.0 in | Wt 162.8 lb

## 2017-09-12 DIAGNOSIS — M7581 Other shoulder lesions, right shoulder: Secondary | ICD-10-CM

## 2017-09-12 DIAGNOSIS — M25511 Pain in right shoulder: Secondary | ICD-10-CM | POA: Diagnosis not present

## 2017-09-12 DIAGNOSIS — M19011 Primary osteoarthritis, right shoulder: Secondary | ICD-10-CM | POA: Diagnosis not present

## 2017-09-12 MED ORDER — METHYLPREDNISOLONE ACETATE 40 MG/ML IJ SUSP
80.0000 mg | Freq: Once | INTRAMUSCULAR | Status: AC
  Administered 2017-09-12: 80 mg via INTRA_ARTICULAR

## 2017-09-12 NOTE — Progress Notes (Signed)
Dr. Frederico Hamman T. Mykle Pascua, MD, [facility] [place], [address] Phone: [phone] Fax: [phone]  09/12/2017  Patient: Janet Chapman, MRN: [id number], DOB: 1935-07-15, 82 y.o.  Primary Physician:  [name], MD   Chief Complaint  Patient presents with  . Shoulder Pain    Right   Subjective:   Janet Chapman is a 82 y.o. very pleasant female patient who presents with the following:  2 weeks ago, had some R shouler pain in the R arm and in the shoulder.   08/30/2017 - woke up and had some pain in her R arm. Came in and saw Allie Bossier, and has been taking some Naprosyn and Tylenol.  She has been in a fair amount of pain, and she is saw Mrs. Clark in our office about a week ago, who gave her some Naprosyn.  She is still had some persistent right-sided shoulder pain in a T-shirt distribution.  She has pain in abduction, and to lesser extent with internal range of motion.  She does have some clicking and popping in her shoulder with movement.  She also complains of some posterior neck pain. No numbness or tingling in her arm.   Early 2017, had some swelling in her arm.   RTC tendonitis R GH OA  Combo injection, R  Past Medical History, Surgical History, Social History, Family History, Problem List, Medications, and Allergies have been reviewed and updated if relevant.  Patient Active Problem List   Diagnosis Date Noted  . Bradycardia 06/12/2017  . Palpitations 06/12/2017  . Acute non-recurrent frontal sinusitis 04/02/2017  . Fatigue 09/12/2016  . Constipation 11/22/2015  . Spider varicose veins 07/22/2015  . Advanced care planning/counseling discussion 07/08/2015  . Change in bowel habits 02/18/2015  . 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 Chronic radicular low back pain 01/02/2014  . Atrophic vaginitis 09/26/2013  . Arthralgia 06/06/2013  . Pulmonary nodule, right 05/23/2013  .  Tinea pedis 12/20/2012  . Osteopenia 07/26/2012  . INSOMNIA, CHRONIC 01/14/2010  . Diabetes mellitus, controlled ([place]) 11/11/2008  . MAMMOGRAM, ABNORMAL, LEFT 11/09/2008  . Chronic idiopathic constipation 09/11/2007  . OBESITY 05/31/2007  . Allergic rhinitis 05/31/2007  . GERD 05/31/2007  . OSTEOARTHRITIS 05/31/2007  . URINARY INCONTINENCE 05/31/2007  . High cholesterol 04/19/2007  . Essential hypertension, benign 04/19/2007  . ACTINIC KERATOSIS 02/01/2007    Past Medical History:  Diagnosis Date  . Allergic rhinitis   . Diabetes mellitus without complication (HCC)    diet controlled  . GERD (gastroesophageal reflux disease)   . Hyperlipidemia   . Hypertension   . Osteoarthritis   . Urinary incontinence     Past Surgical History:  Procedure Laterality Date  . ABDOMINAL HYSTERECTOMY  1993   TA  . CARDIOVASCULAR STRESS TEST  2004   H. Smith, cardiolite-normal  . CHOLECYSTECTOMY  [phone]  . COLONOSCOPY      Social History   Socioeconomic History  . Marital status: Married    Spouse name: Not on file  . Number of children: 0  . Years of education: Not on file  . Highest education level: Not on file  Occupational History  . Occupation: retired  Scientific laboratory technician  . Financial resource strain: Not on file  . Food insecurity:    Worry: Not on file    Inability: Not on file  . Transportation needs:    Medical: Not on file  Non-medical: Not on file  Tobacco Use  . Smoking status: Never Smoker  . Smokeless tobacco: Never Used  Substance and Sexual Activity  . Alcohol use: Yes    Comment: rarely  . Drug use: No  . Sexual activity: Never  Lifestyle  . Physical activity:    Days per week: Not on file    Minutes per session: Not on file  . Stress: Not on file  Relationships  . Social connections:    Talks on phone: Not on file    Gets together: Not on file    Attends religious service: Not on file    Active member of club or organization: Not on file    Attends  meetings of clubs or organizations: Not on file    Relationship status: Not on file  . Intimate partner violence:    Fear of current or ex partner: Not on file    Emotionally abused: Not on file    Physically abused: Not on file    Forced sexual activity: Not on file  Other Topics Concern  . Not on file  Social History Narrative    Regular exercise: yes, Curves 2x a week   Married 48 + years    Diet: fruit and veggies, no FF, some water, artificial sweetener   HCPOA: [name], has living will, full code ( reviewed 2014)             Family History  Problem Relation Age of Onset  . Heart attack Father 50  . Dementia Mother   . Stroke Mother 12  . Ovarian cancer Sister 31  . Diabetes Unknown        Maternal side  . Ovarian cancer Unknown        Grandmother  . Ovarian cancer Unknown        aunt  . Colon cancer Neg Hx     Allergies  Allergen Reactions  . Codeine Nausea Only    Medication list reviewed and updated in full in [place].  GEN: No fevers, chills. Nontoxic. Primarily MSK c/o today. MSK: Detailed in the HPI GI: tolerating PO intake without difficulty Neuro: No numbness, parasthesias, or tingling associated. Otherwise the pertinent positives of the ROS are noted above.   Objective:   BP 140/70   Pulse (!) 52   Temp (!) 97.5 F (36.4 C) (Oral)   Ht 5\' 2"  (1.575 m)   Wt 162 lb 12 oz (73.8 kg)   BMI 29.77 kg/m    GEN: Well-developed,well-nourished,in no acute distress; alert,appropriate and cooperative throughout examination HEENT: Normocephalic and atraumatic without obvious abnormalities. Ears, externally no deformities PULM: Breathing comfortably in no respiratory distress EXT: No clubbing, cyanosis, or edema PSYCH: Normally interactive. Cooperative during the interview. Pleasant. Friendly and conversant. Not anxious or depressed appearing. Normal, full affect.  Shoulder: R Inspection: No muscle wasting or winging Ecchymosis/edema:  neg  AC joint, scapula, clavicle: NT Cervical spine: NT, full ROM Spurling's: neg Abduction: full, 5/5 Flexion: full, 5/5 IR, full, lift-off: 5/5 ER at neutral: full, 5/5 AC crossover: neg Neer: pos Hawkins: pos Drop Test: neg Empty Can: pos Supraspinatus insertion: mild-mod T Bicipital groove: NT Speed's: neg Yergason's: neg Sulcus sign: neg Scapular dyskinesis: none C5-T1 intact  Neuro: Sensation intact Grip 5/5   Radiology: Dg Shoulder Right  Result Date: 09/13/2017 CLINICAL DATA:  Right shoulder pain for week. EXAM: RIGHT SHOULDER - 2+ VIEW COMPARISON:  None. FINDINGS: Intact AC and glenohumeral joints. Spurring off  the lateral aspect of the acromion may predispose the patient to rotator cuff tears. No acute fracture nor joint dislocation. The adjacent ribs and lung are nonacute. Mild soft tissue prominence on the axillary view anteriorly. IMPRESSION: Lateral acromial spur which may contribute to rotator cuff tears. No acute osseous abnormality. Soft tissue prominence seen anteriorly on the axillary view, nonspecific. Electronically Signed   By: Ashley Royalty M.D.   On: 09/13/2017 02:27    The radiological images were independently reviewed by myself in the office and results were reviewed with the patient. My independent interpretation of images: In addition to above, patient does appear to have a mild degree of glenohumeral osteoarthritis, this is primarily seen in the [place] view more on the inferior aspect of the glenohumeral joint.  There is also mild to moderate amount of acromioclavicular osteoarthritis, and there is an undersurface spur, which could predispose the patient to impingement. Electronically Signed  By: Owens Loffler, MD On: 09/13/2017 8:44 AM   Assessment and Plan:   Rotator cuff tendonitis, right - Plan: DG Shoulder Right, methylPREDNISolone acetate (DEPO-MEDROL) injection 80 mg  Right shoulder pain, unspecified chronicity - Plan: DG Shoulder Right,  methylPREDNISolone acetate (DEPO-MEDROL) injection 80 mg  Glenohumeral arthritis, right  Primary pain driver is a combination of some rotator cuff tendinitis, impingement, and she also has some glenohumeral arthritis at age 68.  More to do a combination subacromial injection as well as a intra-articular injection to see if we can calm this down.  If her symptoms persist a few weeks from now, think getting in some formal physical therapy would probably help quite a bit too.  She is going to call me if her symptoms persist.  Intrarticular Shoulder Injection, R Verbal consent was obtained from the patient. Risks including infection explained and contrasted with benefits and alternatives. Patient prepped with Chloraprep and Ethyl Chloride used for anesthesia. An intraarticular shoulder injection was performed using the posterior approach. The patient tolerated the procedure well and had decreased pain post injection. No complications. Injection: 4 cc of Lidocaine 1% and 1 mL Depo-Medrol 40 mg. Needle: 21 gauge, 2 inch   SubAC Injection, R Verbal consent was obtained from the patient. Risks (including rare infection), benefits, and alternatives were explained. Patient prepped with Chloraprep and Ethyl Chloride used for anesthesia. The subacromial space was injected using the posterior approach. The patient tolerated the procedure well and had decreased pain post injection. No complications. Injection: 4 cc of Lidocaine 1% and 1 mL of Depo-Medrol 40 mg. Needle: 22 gauge    Follow-up: No follow-ups on file.  Meds ordered this encounter  Medications  . methylPREDNISolone acetate (DEPO-MEDROL) injection 80 mg   Orders Placed This Encounter  Procedures  . DG Shoulder Right    Signed,  Frederico Hamman T. Detrell Umscheid, MD   Allergies as of 09/12/2017      Reactions   Codeine Nausea Only      Medication List        Accurate as of 09/12/17 11:59 PM. Always use your most recent med list.            aspirin 81 MG tablet Take 81 mg by mouth daily.   CALCIUM PO Take 1 tablet by mouth daily.   COQ10 PO Take by mouth.   diltiazem 120 MG tablet Commonly known as:  CARDIZEM Take 1 tablet (120 mg total) by mouth daily.   fexofenadine 180 MG tablet Commonly known as:  ALLEGRA Take 1 tablet (180 mg total) by  mouth daily.   losartan-hydrochlorothiazide 100-25 MG tablet Commonly known as:  HYZAAR Take 1 tablet by mouth daily.   metoprolol succinate 50 MG 24 hr tablet Commonly known as:  TOPROL-XL Take 1 tablet (50 mg total) by mouth daily.   multivitamin tablet Take 1 tablet by mouth daily.   naproxen 500 MG tablet Commonly known as:  NAPROSYN Take 1 tablet (500 mg total) by mouth 2 (two) times daily with a meal.   OVER THE COUNTER MEDICATION Sage-one tablet twice a day   POTASSIMIN PO Take by mouth.   simvastatin 40 MG tablet Commonly known as:  ZOCOR TAKE 1 TABLET BY MOUTH EACH NIGHT AT BEDTIME   VITAMIN B 12 PO Take by mouth.   Vitamin E 200 units Tabs Take by mouth.

## 2017-09-13 ENCOUNTER — Encounter: Payer: Self-pay | Admitting: Family Medicine

## 2017-09-17 ENCOUNTER — Telehealth: Payer: Self-pay | Admitting: *Deleted

## 2017-09-17 NOTE — Telephone Encounter (Signed)
Janet Chapman sent fax stating that Dr. Lorelei Pont mentioned that she had tendonitis on top of the arthritis but did not suggest therapy for that.  She is wanting to know if there is anything she can do for that.  The cortisone shot really helped, but she still has some pain and wonders if part of it is coming from the other problem.  Please advise.

## 2017-09-17 NOTE — Telephone Encounter (Signed)
We talked about this about 30 mins in the office.   My best suggestion would for me to send her to physical therapy to work with her shoulder more.   Let me know if she agrees?

## 2017-09-17 NOTE — Telephone Encounter (Signed)
Left message for Janet Chapman that Dr. Lorelei Pont recommends sending her to PT for them to work with her shoulder more.  I ask that she call back to let us know is she would like Dr. Lorelei Pont to put in that referral for her.

## 2017-09-25 ENCOUNTER — Telehealth: Payer: Self-pay | Admitting: *Deleted

## 2017-09-25 NOTE — Telephone Encounter (Signed)
Given Dr. Loletha Grayer gave steroid injectiona nd saw pt.. I would like to get his reocmmendaitons.

## 2017-09-25 NOTE — Telephone Encounter (Signed)
Received fax today from Mrs. Stoneberg that states after the cortisone shot her arm began to feel much better, however over the weekend she began to have pain again in the right arm just below the joint,  and a scraping sound in the joint when moving her arm.  She has tried patches that help for a while, but not for long.  Tylenol makes her sleepy but does give her some relief.  She is asking does she need to see Dr. [name] (and maybe another cortisone shot) or can Dr. [name] suggest an orthopedist.

## 2017-09-26 ENCOUNTER — Other Ambulatory Visit: Payer: Self-pay | Admitting: Family Medicine

## 2017-09-26 DIAGNOSIS — M7581 Other shoulder lesions, right shoulder: Secondary | ICD-10-CM

## 2017-09-26 DIAGNOSIS — M19011 Primary osteoarthritis, right shoulder: Secondary | ICD-10-CM

## 2017-09-26 NOTE — Telephone Encounter (Signed)
My thoughts would be to do some formal physical therapy to work on her rotator cuff tendonitis.   The rest of what she describes is similar to description in the office.   Obviously, if she would like to see someone else for another opinion, then that is always completely fine.

## 2017-09-26 NOTE — Telephone Encounter (Signed)
Mrs. Guarnieri notified as instructed by telephone.  She is agreeable to formal physical therapy.  Will forward to Dr. Lorelei Pont to order referral.

## 2017-09-26 NOTE — Progress Notes (Signed)
done

## 2017-10-03 DIAGNOSIS — M19011 Primary osteoarthritis, right shoulder: Secondary | ICD-10-CM | POA: Diagnosis not present

## 2017-10-03 DIAGNOSIS — M25511 Pain in right shoulder: Secondary | ICD-10-CM | POA: Diagnosis not present

## 2017-10-03 DIAGNOSIS — M6281 Muscle weakness (generalized): Secondary | ICD-10-CM | POA: Diagnosis not present

## 2017-10-03 DIAGNOSIS — M25611 Stiffness of right shoulder, not elsewhere classified: Secondary | ICD-10-CM | POA: Diagnosis not present

## 2017-10-09 DIAGNOSIS — M6281 Muscle weakness (generalized): Secondary | ICD-10-CM | POA: Diagnosis not present

## 2017-10-09 DIAGNOSIS — M25611 Stiffness of right shoulder, not elsewhere classified: Secondary | ICD-10-CM | POA: Diagnosis not present

## 2017-10-09 DIAGNOSIS — M19011 Primary osteoarthritis, right shoulder: Secondary | ICD-10-CM | POA: Diagnosis not present

## 2017-10-09 DIAGNOSIS — M25511 Pain in right shoulder: Secondary | ICD-10-CM | POA: Diagnosis not present

## 2017-10-11 DIAGNOSIS — M6281 Muscle weakness (generalized): Secondary | ICD-10-CM | POA: Diagnosis not present

## 2017-10-11 DIAGNOSIS — M25611 Stiffness of right shoulder, not elsewhere classified: Secondary | ICD-10-CM | POA: Diagnosis not present

## 2017-10-11 DIAGNOSIS — M25511 Pain in right shoulder: Secondary | ICD-10-CM | POA: Diagnosis not present

## 2017-10-11 DIAGNOSIS — M19011 Primary osteoarthritis, right shoulder: Secondary | ICD-10-CM | POA: Diagnosis not present

## 2017-10-16 DIAGNOSIS — M19011 Primary osteoarthritis, right shoulder: Secondary | ICD-10-CM | POA: Diagnosis not present

## 2017-10-16 DIAGNOSIS — M25611 Stiffness of right shoulder, not elsewhere classified: Secondary | ICD-10-CM | POA: Diagnosis not present

## 2017-10-16 DIAGNOSIS — M25511 Pain in right shoulder: Secondary | ICD-10-CM | POA: Diagnosis not present

## 2017-10-16 DIAGNOSIS — M6281 Muscle weakness (generalized): Secondary | ICD-10-CM | POA: Diagnosis not present

## 2017-10-18 DIAGNOSIS — M6281 Muscle weakness (generalized): Secondary | ICD-10-CM | POA: Diagnosis not present

## 2017-10-18 DIAGNOSIS — M25511 Pain in right shoulder: Secondary | ICD-10-CM | POA: Diagnosis not present

## 2017-10-18 DIAGNOSIS — M19011 Primary osteoarthritis, right shoulder: Secondary | ICD-10-CM | POA: Diagnosis not present

## 2017-10-18 DIAGNOSIS — M25611 Stiffness of right shoulder, not elsewhere classified: Secondary | ICD-10-CM | POA: Diagnosis not present

## 2017-10-24 DIAGNOSIS — M25511 Pain in right shoulder: Secondary | ICD-10-CM | POA: Diagnosis not present

## 2017-10-24 DIAGNOSIS — M6281 Muscle weakness (generalized): Secondary | ICD-10-CM | POA: Diagnosis not present

## 2017-10-24 DIAGNOSIS — M19011 Primary osteoarthritis, right shoulder: Secondary | ICD-10-CM | POA: Diagnosis not present

## 2017-10-24 DIAGNOSIS — M25611 Stiffness of right shoulder, not elsewhere classified: Secondary | ICD-10-CM | POA: Diagnosis not present

## 2017-10-25 DIAGNOSIS — M25511 Pain in right shoulder: Secondary | ICD-10-CM | POA: Diagnosis not present

## 2017-10-25 DIAGNOSIS — M19011 Primary osteoarthritis, right shoulder: Secondary | ICD-10-CM | POA: Diagnosis not present

## 2017-10-25 DIAGNOSIS — M25611 Stiffness of right shoulder, not elsewhere classified: Secondary | ICD-10-CM | POA: Diagnosis not present

## 2017-10-25 DIAGNOSIS — M6281 Muscle weakness (generalized): Secondary | ICD-10-CM | POA: Diagnosis not present

## 2017-10-29 ENCOUNTER — Telehealth: Payer: Self-pay | Admitting: *Deleted

## 2017-10-29 DIAGNOSIS — M9901 Segmental and somatic dysfunction of cervical region: Secondary | ICD-10-CM | POA: Diagnosis not present

## 2017-10-29 DIAGNOSIS — M50322 Other cervical disc degeneration at C5-C6 level: Secondary | ICD-10-CM | POA: Diagnosis not present

## 2017-10-29 DIAGNOSIS — M5134 Other intervertebral disc degeneration, thoracic region: Secondary | ICD-10-CM | POA: Diagnosis not present

## 2017-10-29 DIAGNOSIS — M9902 Segmental and somatic dysfunction of thoracic region: Secondary | ICD-10-CM | POA: Diagnosis not present

## 2017-10-29 NOTE — Telephone Encounter (Signed)
Received fax from Mrs. Weatherly stating that she is having a lot of trouble with her right arm that Dr. Lorelei Pont diagnosed as arthritis/tendoinitis on May 1.  She states that she has tried everything that she knows to do to ease the pain in that arm/shoulder but it just won't go away.  She is requesting a copy of her x-ray that was done on 09/12/2017 so she can take it to her chiropractor.  Paper results and disc of her actual x-ray left up front for her to pick up per Mrs. Brabant request.

## 2017-10-30 ENCOUNTER — Telehealth: Payer: Self-pay

## 2017-10-30 NOTE — Telephone Encounter (Signed)
Reasonable POC. I will assess tomorrow.

## 2017-10-30 NOTE — Progress Notes (Signed)
Dr. Frederico Hamman T. Charlynn Salih, MD, [facility] [place], [address] Phone: [phone] Fax: [phone]  10/31/2017  Patient: Janet Chapman, MRN: [id number], DOB: 01/23/1936, 82 y.o.  Primary Physician:  [name], MD   Chief Complaint  Patient presents with  . Shoulder Pain    Right  . Shortness of Breath  . Tachycardia   Subjective:   Janet Chapman is a 82 y.o. very pleasant female patient who presents with the following:  I saw the patient in the office 6 weeks ago with some shoulder pain with a combination of the rotator cuff pathology as well as glenohumeral arthritis.  At that point I did a subacromial injection, and ultimately I sent her for physical therapy.  She walked into the office yesterday, and nursing evaluated the patient for stability.  She was complaining of palpitations and intermittent elevated heart rate.  At that time she appeared to be stable for evaluation at a later time, so I am seeing her today.  EKG is normal.  Has been ongoing for 6-8 months, started noticing and now cannot. Giving out of breath with less and less exertion.  Never has been a smoker. Heart rate has also been going up sometimes. Not that often.   Palpitations also at time.   Cardiolite in 2004 by Dr. Linard Millers 04/2013 Had a stress test, normal stress test.  Risk factors.  FH: CAD in father, d/c at 30 years old DM HTN Hyperlipidemia Non-smoker  Past Medical History, Surgical History, Social History, Family History, Problem List, Medications, and Allergies have been reviewed and updated if relevant.  Patient Active Problem List   Diagnosis Date Noted  . Bradycardia 06/12/2017  . Palpitations 06/12/2017  . Acute non-recurrent frontal sinusitis 04/02/2017  . Fatigue 09/12/2016  . Constipation 11/22/2015  . Spider varicose veins 07/22/2015  . Advanced care planning/counseling discussion 07/08/2015  . Change in  bowel habits 02/18/2015  . 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 Chronic radicular low back pain 01/02/2014  . Atrophic vaginitis 09/26/2013  . Arthralgia 06/06/2013  . Pulmonary nodule, right 05/23/2013  . Tinea pedis 12/20/2012  . Osteopenia 07/26/2012  . INSOMNIA, CHRONIC 01/14/2010  . Diabetes mellitus, controlled ([place]) 11/11/2008  . MAMMOGRAM, ABNORMAL, LEFT 11/09/2008  . Chronic idiopathic constipation 09/11/2007  . OBESITY 05/31/2007  . Allergic rhinitis 05/31/2007  . GERD 05/31/2007  . OSTEOARTHRITIS 05/31/2007  . URINARY INCONTINENCE 05/31/2007  . High cholesterol 04/19/2007  . Essential hypertension, benign 04/19/2007  . ACTINIC KERATOSIS 02/01/2007    Past Medical History:  Diagnosis Date  . Allergic rhinitis   . Diabetes mellitus without complication (HCC)    diet controlled  . GERD (gastroesophageal reflux disease)   . Hyperlipidemia   . Hypertension   . Osteoarthritis   . Urinary incontinence     Past Surgical History:  Procedure Laterality Date  . ABDOMINAL HYSTERECTOMY  1993   TA  . CARDIOVASCULAR STRESS TEST  2004   H. Smith, cardiolite-normal  . CHOLECYSTECTOMY  [phone]  . COLONOSCOPY      Social History   Socioeconomic History  . Marital status: Married    Spouse name: Not on file  . Number of children: 0  . Years of education: Not on file  . Highest education level: Not on file  Occupational History  . Occupation: retired  Scientific laboratory technician  . Financial resource strain: Not on file  .  Food insecurity:    Worry: Not on file    Inability: Not on file  . Transportation needs:    Medical: Not on file    Non-medical: Not on file  Tobacco Use  . Smoking status: Never Smoker  . Smokeless tobacco: Never Used  Substance and Sexual Activity  . Alcohol use: Yes    Comment: rarely  . Drug use: No  . Sexual activity: Never  Lifestyle  . Physical activity:    Days per week: Not on file    Minutes per session: Not on  file  . Stress: Not on file  Relationships  . Social connections:    Talks on phone: Not on file    Gets together: Not on file    Attends religious service: Not on file    Active member of club or organization: Not on file    Attends meetings of clubs or organizations: Not on file    Relationship status: Not on file  . Intimate partner violence:    Fear of current or ex partner: Not on file    Emotionally abused: Not on file    Physically abused: Not on file    Forced sexual activity: Not on file  Other Topics Concern  . Not on file  Social History Narrative    Regular exercise: yes, Curves 2x a week   Married 48 + years    Diet: fruit and veggies, no FF, some water, artificial sweetener   HCPOA: [name], has living will, full code ( reviewed 2014)             Family History  Problem Relation Age of Onset  . Heart attack Father 6  . Dementia Mother   . Stroke Mother 49  . Ovarian cancer Sister 41  . Diabetes Unknown        Maternal side  . Ovarian cancer Unknown        Grandmother  . Ovarian cancer Unknown        aunt  . Colon cancer Neg Hx     Allergies  Allergen Reactions  . Codeine Nausea Only    Medication list reviewed and updated in full in [place].  GEN: No fevers, chills. Nontoxic. Primarily MSK c/o today. MSK: Detailed in the HPI GI: tolerating PO intake without difficulty Neuro: No numbness, parasthesias, or tingling associated. Otherwise the pertinent positives of the ROS are noted above.   Objective:   BP 120/80   Pulse (!) 55   Temp 98.2 F (36.8 C) (Oral)   Ht 5\' 2"  (1.575 m)   Wt 157 lb 8 oz (71.4 kg)   SpO2 96%   BMI 28.81 kg/m    GEN: WDWN, NAD, Non-toxic, A & O x 3 HEENT: Atraumatic, Normocephalic. Neck supple. No masses, No LAD. Ears and Nose: No external deformity. CV: RRR, No M/G/R. No JVD. No thrill. No extra heart sounds. PULM: CTA B, no wheezes, crackles, rhonchi. No retractions. No resp. distress. No  accessory muscle use. EXTR: No c/c/e NEURO Normal gait.  PSYCH: Normally interactive. Conversant. Not depressed or anxious appearing.  Calm demeanor.    Shoulder: R Inspection: No muscle wasting or winging Ecchymosis/edema: neg  AC joint, scapula, clavicle: NT Cervical spine: NT, full ROM Spurling's: neg Abduction: full, 5/5 Flexion: full, 5/5 IR, full, lift-off: 5/5 ER at neutral: full, 5/5 AC crossover: neg Neer: pos Hawkins: pos Drop Test: neg Empty Can: pos Supraspinatus insertion: mild-mod T Bicipital groove: NT  Speed's: neg Yergason's: neg Sulcus sign: neg Scapular dyskinesis: none C5-T1 intact  Neuro: Sensation intact Grip 5/5   Radiology: Lab Results  Component Value Date   WBC 6.6 09/12/2016   HGB 13.9 09/12/2016   HCT 40.9 09/12/2016   PLT 233.0 09/12/2016   GLUCOSE 107 (H) 06/07/2017   CHOL 176 06/07/2017   TRIG 58.0 06/07/2017   HDL 77.50 06/07/2017   LDLDIRECT 119.8 04/26/2009   LDLCALC 87 06/07/2017   ALT 15 06/07/2017   AST 17 06/07/2017   NA 140 06/07/2017   K 3.4 (L) 06/07/2017   CL 97 06/07/2017   CREATININE 0.74 06/07/2017   BUN 15 06/07/2017   CO2 38 (H) 06/07/2017   TSH 1.55 09/12/2016   HGBA1C 6.5 06/07/2017   MICROALBUR 0.6 05/18/2010     Assessment and Plan:   DOE (dyspnea on exertion) - Plan: Ambulatory referral to Cardiology  SOB (shortness of breath) - Plan: EKG 12-Lead  Tachycardia - Plan: Ambulatory referral to Cardiology  Palpitations - Plan: Ambulatory referral to Cardiology  Rotator cuff tendonitis, right  Glenohumeral arthritis, right  EKG: Normal sinus rhythm. Normal axis, normal R wave progression, No acute ST elevation or depression.   Patient has multiple cardiac risk factors, and she has significant worsening dyspnea on exertion over time.  She also describes tachycardia and palpitations intermittently.  I think that she needs more of a definitive evaluation by cardiology.  She also continues to have pain  problems with her right shoulder.  This truthfully cannot be fully addressed until her cardiac situation is more fully stable.  She is going to continue doing her home rehab of rotator cuff strengthening and scapular Zavitz stabilization, and if symptoms persist after her cardiac work-up, and next appropriate step would be to get an MRI of her shoulder to evaluate for possible rotator cuff tear.  Follow-up: No follow-ups on file.  Meds ordered this encounter  Medications  . traMADol (ULTRAM) 50 MG tablet    Sig: Take 1 tablet (50 mg total) by mouth every 6 (six) hours as needed for up to 5 days.    Dispense:  20 tablet    Refill:  2   Orders Placed This Encounter  Procedures  . Ambulatory referral to Cardiology  . EKG 12-Lead    Signed,  Uliana Brinker T. Emmalyne Giacomo, MD   Allergies as of 10/31/2017      Reactions   Codeine Nausea Only      Medication List        Accurate as of 10/31/17 11:59 PM. Always use your most recent med list.          aspirin 81 MG tablet Take 81 mg by mouth daily.   CALCIUM PO Take 1 tablet by mouth daily.   COQ10 PO Take by mouth.   diltiazem 120 MG tablet Commonly known as:  CARDIZEM Take 1 tablet (120 mg total) by mouth daily.   fexofenadine 180 MG tablet Commonly known as:  ALLEGRA Take 1 tablet (180 mg total) by mouth daily.   losartan-hydrochlorothiazide 100-25 MG tablet Commonly known as:  HYZAAR Take 1 tablet by mouth daily.   metoprolol succinate 50 MG 24 hr tablet Commonly known as:  TOPROL-XL Take 1 tablet (50 mg total) by mouth daily.   multivitamin tablet Take 1 tablet by mouth daily.   naproxen 500 MG tablet Commonly known as:  NAPROSYN Take 1 tablet (500 mg total) by mouth 2 (two) times daily with a meal.   OVER  THE COUNTER MEDICATION Sage-one tablet twice a day   POTASSIMIN PO Take by mouth.   simvastatin 40 MG tablet Commonly known as:  ZOCOR TAKE 1 TABLET BY MOUTH EACH NIGHT AT BEDTIME   traMADol 50 MG  tablet Commonly known as:  ULTRAM Take 1 tablet (50 mg total) by mouth every 6 (six) hours as needed for up to 5 days.   VITAMIN B 12 PO Take by mouth.   Vitamin E 200 units Tabs Take by mouth.

## 2017-10-30 NOTE — Telephone Encounter (Signed)
Pt walked in; for about one month on and off having SOB, rapid heart beat. Today pt feels little SOB with slight sinus type h/a. Rt shoulder still hurting but pt has stopped going to PT. No CP, dizziness or weakness in limbs. Pt was seen 06/12/17 with rapid heart beat and in office note put if persist may do card referral for holter monitor. Pt is talking normally without apparent difficulty breathing; does not appear in any distress. Vitals now: BP 150/74 lt arm lge cuff, T 98.2 P 55 and pulse ox 97% room air. Pt has been drinking caffeine. I spoke with Dr Darnell Level and pt can schedule appt in AM and if condition worsens or has CP, dizziness,SOB or severe h/a pt will go to ED prior to appt. Also requested pt to stop the caffeine. Pt voiced understanding. Pt scheduled appt with Dr Lorelei Pont on 10/31/17 at 9:40 AM.

## 2017-10-31 ENCOUNTER — Encounter: Payer: Self-pay | Admitting: Family Medicine

## 2017-10-31 ENCOUNTER — Ambulatory Visit (INDEPENDENT_AMBULATORY_CARE_PROVIDER_SITE_OTHER): Payer: PPO | Admitting: Family Medicine

## 2017-10-31 VITALS — BP 120/80 | HR 55 | Temp 98.2°F | Ht 62.0 in | Wt 157.5 lb

## 2017-10-31 DIAGNOSIS — M7581 Other shoulder lesions, right shoulder: Secondary | ICD-10-CM | POA: Diagnosis not present

## 2017-10-31 DIAGNOSIS — R002 Palpitations: Secondary | ICD-10-CM

## 2017-10-31 DIAGNOSIS — R0602 Shortness of breath: Secondary | ICD-10-CM

## 2017-10-31 DIAGNOSIS — R0600 Dyspnea, unspecified: Secondary | ICD-10-CM

## 2017-10-31 DIAGNOSIS — R000 Tachycardia, unspecified: Secondary | ICD-10-CM | POA: Diagnosis not present

## 2017-10-31 DIAGNOSIS — M19011 Primary osteoarthritis, right shoulder: Secondary | ICD-10-CM | POA: Diagnosis not present

## 2017-10-31 DIAGNOSIS — R0609 Other forms of dyspnea: Secondary | ICD-10-CM

## 2017-10-31 MED ORDER — TRAMADOL HCL 50 MG PO TABS: 50.0000 mg | ORAL_TABLET | Freq: Four times a day (QID) | ORAL | 2 refills | Status: DC | PRN

## 2017-10-31 NOTE — Patient Instructions (Signed)
REFERRALS TO SPECIALISTS, SPECIAL TESTS (MRI, CT, ULTRASOUNDS)  MARION or  Anastasiya will help you. ASK CHECK-IN FOR HELP.  Specialist appointment times vary a great deal, based on their schedule / openings. -- Some specialists have very long wait times. (Example. Dermatology)    

## 2017-11-01 ENCOUNTER — Encounter: Payer: Self-pay | Admitting: Cardiology

## 2017-11-01 ENCOUNTER — Ambulatory Visit: Payer: PPO | Admitting: Family Medicine

## 2017-11-02 ENCOUNTER — Telehealth: Payer: Self-pay

## 2017-11-02 ENCOUNTER — Encounter: Payer: Self-pay | Admitting: Cardiology

## 2017-11-02 ENCOUNTER — Ambulatory Visit (INDEPENDENT_AMBULATORY_CARE_PROVIDER_SITE_OTHER): Payer: PPO | Admitting: Cardiology

## 2017-11-02 ENCOUNTER — Ambulatory Visit (HOSPITAL_BASED_OUTPATIENT_CLINIC_OR_DEPARTMENT_OTHER)
Admission: RE | Admit: 2017-11-02 | Discharge: 2017-11-02 | Disposition: A | Discharge status: Home / Self Care | Payer: PPO | Source: Ambulatory Visit | Attending: Cardiology | Admitting: Cardiology

## 2017-11-02 VITALS — BP 122/70 | HR 58 | Ht 62.0 in | Wt 156.4 lb

## 2017-11-02 DIAGNOSIS — I10 Essential (primary) hypertension: Secondary | ICD-10-CM

## 2017-11-02 DIAGNOSIS — I700 Atherosclerosis of aorta: Secondary | ICD-10-CM | POA: Insufficient documentation

## 2017-11-02 DIAGNOSIS — R0602 Shortness of breath: Secondary | ICD-10-CM | POA: Insufficient documentation

## 2017-11-02 DIAGNOSIS — R0789 Other chest pain: Secondary | ICD-10-CM | POA: Insufficient documentation

## 2017-11-02 DIAGNOSIS — E088 Diabetes mellitus due to underlying condition with unspecified complications: Secondary | ICD-10-CM | POA: Diagnosis not present

## 2017-11-02 DIAGNOSIS — R0609 Other forms of dyspnea: Secondary | ICD-10-CM | POA: Diagnosis not present

## 2017-11-02 DIAGNOSIS — R0600 Dyspnea, unspecified: Secondary | ICD-10-CM

## 2017-11-02 DIAGNOSIS — E7800 Pure hypercholesterolemia, unspecified: Secondary | ICD-10-CM

## 2017-11-02 MED ORDER — NITROGLYCERIN 0.4 MG SL SUBL: 0.4000 mg | SUBLINGUAL_TABLET | SUBLINGUAL | 11 refills | Status: DC | PRN

## 2017-11-02 NOTE — Telephone Encounter (Signed)
Called patient three times and no answer so will mail results.

## 2017-11-02 NOTE — Progress Notes (Signed)
Cardiology Office Note:    Date:  11/02/2017   ID:  Janet Chapman, DOB 1936-03-19, MRN [id number]  PCP:  [name], MD  Cardiologist:  Jenean Lindau, MD   Referring MD: Owens Loffler, MD    ASSESSMENT:    1. Essential hypertension, benign   2. High cholesterol   3. Diabetes mellitus due to underlying condition with complication, without long-term current use of insulin ([place])   4. DOE (dyspnea on exertion)   5. Chest tightness    PLAN:    In order of problems listed above:  1. Primary prevention stressed with the patient.  Importance of compliance with diet and medications stressed and she vocalized understanding. 2. Sublingual nitroglycerin prescription was sent, its protocol and 911 protocol explained and the patient vocalized understanding questions were answered to the patient's satisfaction 3. Her blood pressure is stable.  Diet was discussed for diabetes mellitus and dyslipidemia. 4. In view of the patient's symptoms, I discussed with the patient options for evaluation. Invasive and noninvasive options were given to the patient. I discussed stress testing and coronary angiography and left heart catheterization at length. Benefits, pros and cons of each approach were discussed at length. Patient had multiple questions which were answered to the patient's satisfaction. Patient opted for invasive evaluation and we will set up for coronary angiography and left heart catheterization. Further recommendations will be made based on the findings with coronary angiography. In the interim if the patient has any significant symptoms in hospital to the nearest emergency room.    Medication Adjustments/Labs and Tests Ordered: Current medicines are reviewed at length with the patient today.  Concerns regarding medicines are outlined above.  Orders Placed This Encounter  Procedures  . DG Chest 2 View  . CBC with Differential/Platelet  . Basic metabolic panel   Meds ordered this  encounter  Medications  . nitroGLYCERIN (NITROSTAT) 0.4 MG SL tablet    Sig: Place 1 tablet (0.4 mg total) under the tongue every 5 (five) minutes as needed.    Dispense:  25 tablet    Refill:  11     History of Present Illness:    Janet Chapman is a 82 y.o. female who is being seen today for the evaluation of chest tightness and shortness of breath on exertion at the request of Copland, Frederico Hamman, MD.  Patient is a pleasant 82 year old female.  She has history of essential hypertension, diet-controlled diabetes mellitus, dyslipidemia.  She mentions to me that in the past several weeks she has been noticing chest tightness and shortness of breath on exertion consistently when she starts walking.  This occurs consistently after a certain amount of walking.  Her effort tolerance has significantly reduced and she is very worried about it.  She also tells me at times it goes to the right arm.  No orthopnea or PND.  Her husband accompanies her for this visit.  At the time of my evaluation, the patient is alert awake oriented and in no distress.  Past Medical History:  Diagnosis Date  . Allergic rhinitis   . Diabetes mellitus without complication (HCC)    diet controlled  . GERD (gastroesophageal reflux disease)   . Hyperlipidemia   . Hypertension   . Osteoarthritis   . Urinary incontinence     Past Surgical History:  Procedure Laterality Date  . ABDOMINAL HYSTERECTOMY  1993   [facility]  . CARDIOVASCULAR STRESS TEST  2004   [facility] [place]  1990's  . COLONOSCOPY      Current Medications: Current Meds  Medication Sig  . aspirin 81 MG tablet Take 81 mg by mouth daily.    . Coenzyme Q10 (COQ10 PO) Take 1 tablet by mouth daily.   Marland Kitchen diltiazem (CARDIZEM) 120 MG tablet Take 1 tablet (120 mg total) by mouth daily.  Marland Kitchen losartan-hydrochlorothiazide (HYZAAR) 100-25 MG tablet Take 1 tablet by mouth daily.  . metoprolol succinate (TOPROL-XL) 50 MG 24 hr tablet Take 1  tablet (50 mg total) by mouth daily.  Marland Kitchen OVER THE COUNTER MEDICATION Sage-one tablet twice a day  . simvastatin (ZOCOR) 40 MG tablet TAKE 1 TABLET BY MOUTH EACH NIGHT AT BEDTIME  . traMADol (ULTRAM) 50 MG tablet Take 1 tablet (50 mg total) by mouth every 6 (six) hours as needed for up to 5 days.     Allergies:   Codeine   Social History   Socioeconomic History  . Marital status: Married    Spouse name: Not on file  . Number of children: 0  . Years of education: Not on file  . Highest education level: Not on file  Occupational History  . Occupation: retired  Scientific laboratory technician  . Financial resource strain: Not on file  . Food insecurity:    Worry: Not on file    Inability: Not on file  . Transportation needs:    Medical: Not on file    Non-medical: Not on file  Tobacco Use  . Smoking status: Never Smoker  . Smokeless tobacco: Never Used  Substance and Sexual Activity  . Alcohol use: Yes    Comment: rarely  . Drug use: No  . Sexual activity: Never  Lifestyle  . Physical activity:    Days per week: Not on file    Minutes per session: Not on file  . Stress: Not on file  Relationships  . Social connections:    Talks on phone: Not on file    Gets together: Not on file    Attends religious service: Not on file    Active member of club or organization: Not on file    Attends meetings of clubs or organizations: Not on file    Relationship status: Not on file  Other Topics Concern  . Not on file  Social History Narrative    Regular exercise: yes, Curves 2x a week   Married 48 + years    Diet: fruit and veggies, no FF, some water, artificial sweetener   HCPOA: [name], has living will, full code ( reviewed 2014)              Family History: The patient's family history includes Dementia in her mother; Diabetes in her unknown relative; Heart attack (age of onset: 84) in her father; Ovarian cancer in her unknown relative and unknown relative; Ovarian cancer (age of  onset: 7) in her sister; Stroke (age of onset: 62) in her mother. There is no history of Colon cancer.  ROS:   Please see the history of present illness.    All other systems reviewed and are negative.  EKGs/Labs/Other Studies Reviewed:    The following studies were reviewed today: EKG reveals sinus rhythm and nonspecific ST-T changes.   Recent Labs: 06/07/2017: ALT 15; BUN 15; Creatinine, Ser 0.74; Potassium 3.4; Sodium 140  Recent Lipid Panel    Component Value Date/Time   CHOL 176 06/07/2017 0912   TRIG 58.0 06/07/2017 0912   HDL 77.50 06/07/2017 0912  CHOLHDL 2 06/07/2017 0912   VLDL 11.6 06/07/2017 0912   LDLCALC 87 06/07/2017 0912   LDLDIRECT 119.8 04/26/2009 0846    Physical Exam:    VS:  BP 122/70 (BP Location: Left Arm, Patient Position: Sitting, Cuff Size: Normal)   Pulse (!) 58   Ht 5\' 2"  (1.575 m)   Wt 156 lb 6.4 oz (70.9 kg)   SpO2 94%   BMI 28.61 kg/m     Wt Readings from Last 3 Encounters:  11/02/17 156 lb 6.4 oz (70.9 kg)  10/31/17 157 lb 8 oz (71.4 kg)  09/12/17 162 lb 12 oz (73.8 kg)     GEN: Patient is in no acute distress HEENT: Normal NECK: No JVD; No carotid bruits LYMPHATICS: No lymphadenopathy CARDIAC: S1 S2 regular, 2/6 systolic murmur at the apex. RESPIRATORY:  Clear to auscultation without rales, wheezing or rhonchi  ABDOMEN: Soft, non-tender, non-distended MUSCULOSKELETAL:  No edema; No deformity  SKIN: Warm and dry NEUROLOGIC:  Alert and oriented x 3 PSYCHIATRIC:  Normal affect    Signed, Jenean Lindau, MD  11/02/2017 11:07 AM    [place]

## 2017-11-02 NOTE — Patient Instructions (Signed)
Medication Instructions:  Your physician has recommended you make the following change in your medication:  START Nitroglycerin 0.4 mg sublingual (under your tongue) as needed for chest pain. If experiencing chest pain, stop what you are doing and sit down. Take 1 nitroglycerin and wait 5 minutes. If chest pain continues, take another nitroglycerin and wait 5 minutes. If chest pain does not subside, take 1 more nitroglycerin and dial 911. You make take a total of 3 nitroglycerin in a 15 minute time frame.  Labwork: Your physician recommends that you have the following labs drawn: CBC and BMP today.  Testing/Procedures: A chest x-ray takes a picture of the organs and structures inside the chest, including the heart, lungs, and blood vessels. This test can show several things, including, whether the heart is enlarges; whether fluid is building up in the lungs; and whether pacemaker / defibrillator leads are still in place.    [facility] [address] [place] [address] Dept: [phone] Loc: [phone]  ASLEE SUCH  11/02/2017  You are scheduled for a Cardiac Catheterization on Tuesday, June 25 with Dr. Lauree Chandler.  1. Please arrive at the [facility] (Main Entrance A) at [facility]: [address] [place],  [address] at 11:30 AM (two hours before your procedure to ensure your preparation). Free valet parking service is available.   Special note: Every effort is made to have your procedure done on time. Please understand that emergencies sometimes delay scheduled procedures.  2. Diet: You may have a clear liquid diet until 5 AM the morning of the heart cath.  3. Labs: Done today.  4. Medication instructions in preparation for your procedure:  Please do not take your losartan-hydrochlorothiazide the morning of the heart cath.  On the morning of your  procedure, take your Aspirin and any morning medicines NOT listed above.  You may use sips of water.  5. Plan for one night stay--bring personal belongings. 6. Bring a current list of your medications and current insurance cards. 7. You MUST have a responsible person to drive you home. 8. Someone MUST be with you the first 24 hours after you arrive home or your discharge will be delayed. 9. Please wear clothes that are easy to get on and off and wear slip-on shoes.  Thank you for allowing [place] to care for you!   -- [facility]   Follow-Up: Your physician recommends that you schedule a follow-up appointment in: 1 month  Any Other Special Instructions Will Be Listed Below (If Applicable).     If you need a refill on your cardiac medications before your next appointment, please call your pharmacy.   [place], RN, BSN   Coronary Angiogram With Stent Coronary angiogram with stent placement is a procedure to widen or open a narrow blood vessel of the heart (coronary artery). Arteries may become blocked by cholesterol buildup (plaques) in the lining of the wall. When a coronary artery becomes partially blocked, blood flow to that area decreases. This may lead to chest pain or a heart attack (myocardial infarction). A stent is a small piece of metal that looks like mesh or a spring. Stent placement may be done as treatment for a heart attack or right after a coronary angiogram in which a blocked artery is found. Let your health care provider know about:  Any allergies you have.  All medicines you are taking, including vitamins, herbs, eye drops, creams, and over-the-counter medicines.  Any problems you or family members have had with anesthetic medicines.  Any blood disorders you have.  Any surgeries you have had.  Any medical conditions you have.  Whether you are pregnant or may be pregnant. What are the risks? Generally, this is  a safe procedure. However, problems may occur, including:  Damage to the heart or its blood vessels.  A return of blockage.  Bleeding, infection, or bruising at the insertion site.  A collection of blood under the skin (hematoma) at the insertion site.  A blood clot in another part of the body.  Kidney injury.  Allergic reaction to the dye or contrast that is used.  Bleeding into the abdomen (retroperitoneal bleeding).  What happens before the procedure? Staying hydrated Follow instructions from your health care provider about hydration, which may include:  Up to 2 hours before the procedure - you may continue to drink clear liquids, such as water, clear fruit juice, black coffee, and plain tea.  Eating and drinking restrictions Follow instructions from your health care provider about eating and drinking, which may include:  8 hours before the procedure - stop eating heavy meals or foods such as meat, fried foods, or fatty foods.  6 hours before the procedure - stop eating light meals or foods, such as toast or cereal.  2 hours before the procedure - stop drinking clear liquids.  Ask your health care provider about:  Changing or stopping your regular medicines. This is especially important if you are taking diabetes medicines or blood thinners.  Taking medicines such as ibuprofen. These medicines can thin your blood. Do not take these medicines before your procedure if your health care provider instructs you not to. Generally, aspirin is recommended before a procedure of passing a small, thin tube (catheter) through a blood vessel and into the heart (cardiac catheterization).  What happens during the procedure?  An IV tube will be inserted into one of your veins.  You will be given one or more of the following: ? A medicine to help you relax (sedative). ? A medicine to numb the area where the catheter will be inserted into an artery (local anesthetic).  To reduce your  risk of infection: ? Your health care team will wash or sanitize their hands. ? Your skin will be washed with soap. ? Hair may be removed from the area where the catheter will be inserted.  Using a guide wire, the catheter will be inserted into an artery. The location may be in your groin, in your wrist, or in the fold of your arm (near your elbow).  A type of X-ray (fluoroscopy) will be used to help guide the catheter to the opening of the arteries in the heart.  A dye will be injected into the catheter, and X-rays will be taken. The dye will help to show where any narrowing or blockages are located in the arteries.  A tiny wire will be guided to the blocked spot, and a balloon will be inflated to make the artery wider.  The stent will be expanded and will crush the plaques into the wall of the vessel. The stent will hold the area open and improve the blood flow. Most stents have a drug coating to reduce the risk of the stent narrowing over time.  The artery may be made wider using a drill, laser, or other tools to remove plaques.  When  the blood flow is better, the catheter will be removed. The lining of the artery will grow over the stent, which stays where it was placed. This procedure may vary among health care providers and hospitals. What happens after the procedure?  If the procedure is done through the leg, you will be kept in bed lying flat for about 6 hours. You will be instructed to not bend and not cross your legs.  The insertion site will be checked frequently.  The pulse in your foot or wrist will be checked frequently.  You may have additional blood tests, X-rays, and a test that records the electrical activity of your heart (electrocardiogram, or ECG). This information is not intended to replace advice given to you by your health care provider. Make sure you discuss any questions you have with your health care provider. Document Released: 11/05/2002 Document Revised:  12/30/2015 Document Reviewed: 12/05/2015 Elsevier Interactive Patient Education  2018 [employer]. Nitroglycerin sublingual tablets What is this medicine? NITROGLYCERIN (nye troe GLI ser in) is a type of vasodilator. It relaxes blood vessels, increasing the blood and oxygen supply to your heart. This medicine is used to relieve chest pain caused by angina. It is also used to prevent chest pain before activities like climbing stairs, going outdoors in cold weather, or sexual activity. This medicine may be used for other purposes; ask your health care provider or pharmacist if you have questions. COMMON BRAND NAME(S): Nitroquick, Nitrostat, Nitrotab What should I tell my health care provider before I take this medicine? They need to know if you have any of these conditions: -anemia -head injury, recent stroke, or bleeding in the brain -liver disease -previous heart attack -an unusual or allergic reaction to nitroglycerin, other medicines, foods, dyes, or preservatives -pregnant or trying to get pregnant -breast-feeding How should I use this medicine? Take this medicine by mouth as needed. At the first sign of an angina attack (chest pain or tightness) place one tablet under your tongue. You can also take this medicine 5 to 10 minutes before an event likely to produce chest pain. Follow the directions on the prescription label. Let the tablet dissolve under the tongue. Do not swallow whole. Replace the dose if you accidentally swallow it. It will help if your mouth is not dry. Saliva around the tablet will help it to dissolve more quickly. Do not eat or drink, smoke or chew tobacco while a tablet is dissolving. If you are not better within 5 minutes after taking ONE dose of nitroglycerin, call 9-1-1 immediately to seek emergency medical care. Do not take more than 3 nitroglycerin tablets over 15 minutes. If you take this medicine often to relieve symptoms of angina, your doctor or health care  professional may provide you with different instructions to manage your symptoms. If symptoms do not go away after following these instructions, it is important to call 9-1-1 immediately. Do not take more than 3 nitroglycerin tablets over 15 minutes. Talk to your pediatrician regarding the use of this medicine in children. Special care may be needed. Overdosage: If you think you have taken too much of this medicine contact a poison control center or emergency room at once. NOTE: This medicine is only for you. Do not share this medicine with others. What if I miss a dose? This does not apply. This medicine is only used as needed. What may interact with this medicine? Do not take this medicine with any of the following medications: -certain migraine medicines like ergotamine  and dihydroergotamine (DHE) -medicines used to treat erectile dysfunction like sildenafil, tadalafil, and vardenafil -riociguat This medicine may also interact with the following medications: -alteplase -aspirin -heparin -medicines for high blood pressure -medicines for mental depression -other medicines used to treat angina -phenothiazines like chlorpromazine, mesoridazine, prochlorperazine, thioridazine This list may not describe all possible interactions. Give your health care provider a list of all the medicines, herbs, non-prescription drugs, or dietary supplements you use. Also tell them if you smoke, drink alcohol, or use illegal drugs. Some items may interact with your medicine. What should I watch for while using this medicine? Tell your doctor or health care professional if you feel your medicine is no longer working. Keep this medicine with you at all times. Sit or lie down when you take your medicine to prevent falling if you feel dizzy or faint after using it. Try to remain calm. This will help you to feel better faster. If you feel dizzy, take several deep breaths and lie down with your feet propped up, or bend  forward with your head resting between your knees. You may get drowsy or dizzy. Do not drive, use machinery, or do anything that needs mental alertness until you know how this drug affects you. Do not stand or sit up quickly, especially if you are an older patient. This reduces the risk of dizzy or fainting spells. Alcohol can make you more drowsy and dizzy. Avoid alcoholic drinks. Do not treat yourself for coughs, colds, or pain while you are taking this medicine without asking your doctor or health care professional for advice. Some ingredients may increase your blood pressure. What side effects may I notice from receiving this medicine? Side effects that you should report to your doctor or health care professional as soon as possible: -blurred vision -dry mouth -skin rash -sweating -the feeling of extreme pressure in the head -unusually weak or tired Side effects that usually do not require medical attention (report to your doctor or health care professional if they continue or are bothersome): -flushing of the face or neck -headache -irregular heartbeat, palpitations -nausea, vomiting This list may not describe all possible side effects. Call your doctor for medical advice about side effects. You may report side effects to FDA at 1-800-FDA-1088. Where should I keep my medicine? Keep out of the reach of children. Store at room temperature between 20 and 25 degrees C (68 and 77 degrees F). Store in [employer]. Protect from light and moisture. Keep tightly closed. Throw away any unused medicine after the expiration date. NOTE: This sheet is a summary. It may not cover all possible information. If you have questions about this medicine, talk to your doctor, pharmacist, or health care provider.  2018 Elsevier/Gold Standard (2013-02-27 17:57:36)

## 2017-11-02 NOTE — H&P (View-Only) (Signed)
Cardiology Office Note:    Date:  11/02/2017   ID:  Janet Chapman, DOB 05-20-1935, MRN [id number]  PCP:  [name], MD  Cardiologist:  Jenean Lindau, MD   Referring MD: Owens Loffler, MD    ASSESSMENT:    1. Essential hypertension, benign   2. High cholesterol   3. Diabetes mellitus due to underlying condition with complication, without long-term current use of insulin ([place])   4. DOE (dyspnea on exertion)   5. Chest tightness    PLAN:    In order of problems listed above:  1. Primary prevention stressed with the patient.  Importance of compliance with diet and medications stressed and she vocalized understanding. 2. Sublingual nitroglycerin prescription was sent, its protocol and 911 protocol explained and the patient vocalized understanding questions were answered to the patient's satisfaction 3. Her blood pressure is stable.  Diet was discussed for diabetes mellitus and dyslipidemia. 4. In view of the patient's symptoms, I discussed with the patient options for evaluation. Invasive and noninvasive options were given to the patient. I discussed stress testing and coronary angiography and left heart catheterization at length. Benefits, pros and cons of each approach were discussed at length. Patient had multiple questions which were answered to the patient's satisfaction. Patient opted for invasive evaluation and we will set up for coronary angiography and left heart catheterization. Further recommendations will be made based on the findings with coronary angiography. In the interim if the patient has any significant symptoms in hospital to the nearest emergency room.    Medication Adjustments/Labs and Tests Ordered: Current medicines are reviewed at length with the patient today.  Concerns regarding medicines are outlined above.  Orders Placed This Encounter  Procedures  . DG Chest 2 View  . CBC with Differential/Platelet  . Basic metabolic panel   Meds ordered this  encounter  Medications  . nitroGLYCERIN (NITROSTAT) 0.4 MG SL tablet    Sig: Place 1 tablet (0.4 mg total) under the tongue every 5 (five) minutes as needed.    Dispense:  25 tablet    Refill:  11     History of Present Illness:    Janet Chapman is a 82 y.o. female who is being seen today for the evaluation of chest tightness and shortness of breath on exertion at the request of Copland, Frederico Hamman, MD.  Patient is a pleasant 82 year old female.  She has history of essential hypertension, diet-controlled diabetes mellitus, dyslipidemia.  She mentions to me that in the past several weeks she has been noticing chest tightness and shortness of breath on exertion consistently when she starts walking.  This occurs consistently after a certain amount of walking.  Her effort tolerance has significantly reduced and she is very worried about it.  She also tells me at times it goes to the right arm.  No orthopnea or PND.  Her husband accompanies her for this visit.  At the time of my evaluation, the patient is alert awake oriented and in no distress.  Past Medical History:  Diagnosis Date  . Allergic rhinitis   . Diabetes mellitus without complication (HCC)    diet controlled  . GERD (gastroesophageal reflux disease)   . Hyperlipidemia   . Hypertension   . Osteoarthritis   . Urinary incontinence     Past Surgical History:  Procedure Laterality Date  . ABDOMINAL HYSTERECTOMY  1993   TA  . CARDIOVASCULAR STRESS TEST  2004   [facility]. [facility], [place]  1990's  . COLONOSCOPY      Current Medications: Current Meds  Medication Sig  . aspirin 81 MG tablet Take 81 mg by mouth daily.    . Coenzyme Q10 (COQ10 PO) Take 1 tablet by mouth daily.   Marland Kitchen diltiazem (CARDIZEM) 120 MG tablet Take 1 tablet (120 mg total) by mouth daily.  Marland Kitchen losartan-hydrochlorothiazide (HYZAAR) 100-25 MG tablet Take 1 tablet by mouth daily.  . metoprolol succinate (TOPROL-XL) 50 MG 24 hr tablet Take 1  tablet (50 mg total) by mouth daily.  Marland Kitchen OVER THE COUNTER MEDICATION Sage-one tablet twice a day  . simvastatin (ZOCOR) 40 MG tablet TAKE 1 TABLET BY MOUTH EACH NIGHT AT BEDTIME  . traMADol (ULTRAM) 50 MG tablet Take 1 tablet (50 mg total) by mouth every 6 (six) hours as needed for up to 5 days.     Allergies:   Codeine   Social History   Socioeconomic History  . Marital status: Married    Spouse name: Not on file  . Number of children: 0  . Years of education: Not on file  . Highest education level: Not on file  Occupational History  . Occupation: retired  Scientific laboratory technician  . Financial resource strain: Not on file  . Food insecurity:    Worry: Not on file    Inability: Not on file  . Transportation needs:    Medical: Not on file    Non-medical: Not on file  Tobacco Use  . Smoking status: Never Smoker  . Smokeless tobacco: Never Used  Substance and Sexual Activity  . Alcohol use: Yes    Comment: rarely  . Drug use: No  . Sexual activity: Never  Lifestyle  . Physical activity:    Days per week: Not on file    Minutes per session: Not on file  . Stress: Not on file  Relationships  . Social connections:    Talks on phone: Not on file    Gets together: Not on file    Attends religious service: Not on file    Active member of club or organization: Not on file    Attends meetings of clubs or organizations: Not on file    Relationship status: Not on file  Other Topics Concern  . Not on file  Social History Narrative    Regular exercise: yes, Curves 2x a week   Married 48 + years    Diet: fruit and veggies, no FF, some water, artificial sweetener   HCPOA: [name], has living will, full code ( reviewed 2014)              Family History: The patient's family history includes Dementia in her mother; Diabetes in her unknown relative; Heart attack (age of onset: 37) in her father; Ovarian cancer in her unknown relative and unknown relative; Ovarian cancer (age of  onset: 10) in her sister; Stroke (age of onset: 69) in her mother. There is no history of Colon cancer.  ROS:   Please see the history of present illness.    All other systems reviewed and are negative.  EKGs/Labs/Other Studies Reviewed:    The following studies were reviewed today: EKG reveals sinus rhythm and nonspecific ST-T changes.   Recent Labs: 06/07/2017: ALT 15; BUN 15; Creatinine, Ser 0.74; Potassium 3.4; Sodium 140  Recent Lipid Panel    Component Value Date/Time   CHOL 176 06/07/2017 0912   TRIG 58.0 06/07/2017 0912   HDL 77.50 06/07/2017 0912  CHOLHDL 2 06/07/2017 0912   VLDL 11.6 06/07/2017 0912   LDLCALC 87 06/07/2017 0912   LDLDIRECT 119.8 04/26/2009 0846    Physical Exam:    VS:  BP 122/70 (BP Location: Left Arm, Patient Position: Sitting, Cuff Size: Normal)   Pulse (!) 58   Ht 5\' 2"  (1.575 m)   Wt 156 lb 6.4 oz (70.9 kg)   SpO2 94%   BMI 28.61 kg/m     Wt Readings from Last 3 Encounters:  11/02/17 156 lb 6.4 oz (70.9 kg)  10/31/17 157 lb 8 oz (71.4 kg)  09/12/17 162 lb 12 oz (73.8 kg)     GEN: Patient is in no acute distress HEENT: Normal NECK: No JVD; No carotid bruits LYMPHATICS: No lymphadenopathy CARDIAC: S1 S2 regular, 2/6 systolic murmur at the apex. RESPIRATORY:  Clear to auscultation without rales, wheezing or rhonchi  ABDOMEN: Soft, non-tender, non-distended MUSCULOSKELETAL:  No edema; No deformity  SKIN: Warm and dry NEUROLOGIC:  Alert and oriented x 3 PSYCHIATRIC:  Normal affect    Signed, Jenean Lindau, MD  11/02/2017 11:07 AM    [place]

## 2017-11-03 LAB — CBC WITH DIFFERENTIAL/PLATELET
Basophils Absolute: 0 10*3/uL (ref 0.0–0.2)
Basos: 0 %
EOS (ABSOLUTE): 0.1 10*3/uL (ref 0.0–0.4)
Eos: 1 %
Hematocrit: 42.3 % (ref 34.0–46.6)
Hemoglobin: 14.4 g/dL (ref 11.1–15.9)
Immature Grans (Abs): 0 10*3/uL (ref 0.0–0.1)
Immature Granulocytes: 0 %
Lymphocytes Absolute: 1.7 10*3/uL (ref 0.7–3.1)
Lymphs: 29 %
MCH: 32.3 pg (ref 26.6–33.0)
MCHC: 34 g/dL (ref 31.5–35.7)
MCV: 95 fL (ref 79–97)
Monocytes Absolute: 0.7 10*3/uL (ref 0.1–0.9)
Monocytes: 12 %
Neutrophils Absolute: 3.6 10*3/uL (ref 1.4–7.0)
Neutrophils: 58 %
Platelets: 253 10*3/uL (ref 150–450)
RBC: 4.46 x10E6/uL (ref 3.77–5.28)
RDW: 14.4 % (ref 12.3–15.4)
WBC: 6.1 10*3/uL (ref 3.4–10.8)

## 2017-11-03 LAB — BASIC METABOLIC PANEL
BUN/Creatinine Ratio: 17 (ref 12–28)
BUN: 12 mg/dL (ref 8–27)
CO2: 30 mmol/L — ABNORMAL HIGH (ref 20–29)
Calcium: 9.8 mg/dL (ref 8.7–10.3)
Chloride: 95 mmol/L — ABNORMAL LOW (ref 96–106)
Creatinine, Ser: 0.7 mg/dL (ref 0.57–1.00)
GFR calc Af Amer: 95 mL/min/{1.73_m2} (ref >59)
GFR calc non Af Amer: 83 mL/min/{1.73_m2} (ref >59)
Glucose: 108 mg/dL — ABNORMAL HIGH (ref 65–99)
Potassium: 3.6 mmol/L (ref 3.5–5.2)
Sodium: 140 mmol/L (ref 134–144)

## 2017-11-05 ENCOUNTER — Telehealth: Payer: Self-pay | Admitting: *Deleted

## 2017-11-05 ENCOUNTER — Telehealth: Payer: Self-pay

## 2017-11-05 NOTE — Telephone Encounter (Signed)
Called patient three times and unable to contact so will mail results.

## 2017-11-05 NOTE — Telephone Encounter (Signed)
Pt contacted pre-catheterization scheduled at [facility] for: Tuesday November 06, 2017 1:30 PM Verified arrival time and place: [facility] A at: 11:30 AM  No solid food after midnight prior to cath, clear liquids until 5 AM day of procedure. Verified allergies in Epic Verified no diabetes medications.  Hold: Losartan -HCTZ AM of procedure  Except hold medications AM meds can be  taken pre-cath with sip of water including: ASA 81 mg  Confirmed patient has responsible person to drive home post procedure and observe patient for 24 hours: yes

## 2017-11-06 ENCOUNTER — Ambulatory Visit (HOSPITAL_COMMUNITY)
Admission: RE | Admit: 2017-11-06 | Discharge: 2017-11-06 | Disposition: A | Discharge status: Home / Self Care | Payer: PPO | Source: Ambulatory Visit | Attending: Cardiovascular Disease | Admitting: Cardiovascular Disease

## 2017-11-06 ENCOUNTER — Ambulatory Visit (HOSPITAL_COMMUNITY)
Admission: RE | Disposition: A | Discharge status: Home / Self Care | Payer: Self-pay | Source: Ambulatory Visit | Attending: Cardiovascular Disease

## 2017-11-06 ENCOUNTER — Encounter (HOSPITAL_COMMUNITY): Payer: Self-pay | Admitting: *Deleted

## 2017-11-06 DIAGNOSIS — R0609 Other forms of dyspnea: Secondary | ICD-10-CM

## 2017-11-06 DIAGNOSIS — I2584 Coronary atherosclerosis due to calcified coronary lesion: Secondary | ICD-10-CM | POA: Insufficient documentation

## 2017-11-06 DIAGNOSIS — E118 Type 2 diabetes mellitus with unspecified complications: Secondary | ICD-10-CM | POA: Diagnosis not present

## 2017-11-06 DIAGNOSIS — Z8249 Family history of ischemic heart disease and other diseases of the circulatory system: Secondary | ICD-10-CM | POA: Diagnosis not present

## 2017-11-06 DIAGNOSIS — Z885 Allergy status to narcotic agent status: Secondary | ICD-10-CM | POA: Insufficient documentation

## 2017-11-06 DIAGNOSIS — Z90710 Acquired absence of both cervix and uterus: Secondary | ICD-10-CM | POA: Diagnosis not present

## 2017-11-06 DIAGNOSIS — M1990 Unspecified osteoarthritis, unspecified site: Secondary | ICD-10-CM | POA: Diagnosis not present

## 2017-11-06 DIAGNOSIS — R0600 Dyspnea, unspecified: Secondary | ICD-10-CM

## 2017-11-06 DIAGNOSIS — Z833 Family history of diabetes mellitus: Secondary | ICD-10-CM | POA: Diagnosis not present

## 2017-11-06 DIAGNOSIS — Z9049 Acquired absence of other specified parts of digestive tract: Secondary | ICD-10-CM | POA: Diagnosis not present

## 2017-11-06 DIAGNOSIS — I10 Essential (primary) hypertension: Secondary | ICD-10-CM | POA: Diagnosis not present

## 2017-11-06 DIAGNOSIS — I25110 Atherosclerotic heart disease of native coronary artery with unstable angina pectoris: Secondary | ICD-10-CM

## 2017-11-06 DIAGNOSIS — Z79899 Other long term (current) drug therapy: Secondary | ICD-10-CM | POA: Diagnosis not present

## 2017-11-06 DIAGNOSIS — E7800 Pure hypercholesterolemia, unspecified: Secondary | ICD-10-CM

## 2017-11-06 DIAGNOSIS — Z9889 Other specified postprocedural states: Secondary | ICD-10-CM | POA: Insufficient documentation

## 2017-11-06 DIAGNOSIS — R0789 Other chest pain: Secondary | ICD-10-CM

## 2017-11-06 DIAGNOSIS — K219 Gastro-esophageal reflux disease without esophagitis: Secondary | ICD-10-CM | POA: Insufficient documentation

## 2017-11-06 DIAGNOSIS — E088 Diabetes mellitus due to underlying condition with unspecified complications: Secondary | ICD-10-CM

## 2017-11-06 DIAGNOSIS — Z7982 Long term (current) use of aspirin: Secondary | ICD-10-CM | POA: Diagnosis not present

## 2017-11-06 HISTORY — PX: LEFT HEART CATH AND CORONARY ANGIOGRAPHY: CATH118249

## 2017-11-06 LAB — GLUCOSE, CAPILLARY
Glucose-Capillary: 99 mg/dL (ref 70–99)
Glucose-Capillary: 103 mg/dL — ABNORMAL HIGH (ref 70–99)

## 2017-11-06 LAB — POCT ACTIVATED CLOTTING TIME: Activated Clotting Time: 296 seconds

## 2017-11-06 SURGERY — LEFT HEART CATH AND CORONARY ANGIOGRAPHY
Anesthesia: LOCAL

## 2017-11-06 MED ORDER — ADENOSINE 12 MG/4ML IV SOLN
INTRAVENOUS | Status: AC
  Filled 2017-11-06: qty 16

## 2017-11-06 MED ORDER — HYDRALAZINE HCL 20 MG/ML IJ SOLN: 5.0000 mg | INTRAMUSCULAR | Status: DC | PRN

## 2017-11-06 MED ORDER — ADENOSINE (DIAGNOSTIC) 140MCG/KG/MIN
INTRAVENOUS | Status: DC | PRN
  Administered 2017-11-06: 140 ug/kg/min via INTRAVENOUS

## 2017-11-06 MED ORDER — LIDOCAINE HCL (PF) 1 % IJ SOLN
INTRAMUSCULAR | Status: AC
  Filled 2017-11-06: qty 30

## 2017-11-06 MED ORDER — SODIUM CHLORIDE 0.9 % IV SOLN
INTRAVENOUS | Status: AC
  Administered 2017-11-06: 16:00:00 via INTRAVENOUS

## 2017-11-06 MED ORDER — HEPARIN (PORCINE) IN NACL 2-0.9 UNITS/ML
INTRAMUSCULAR | Status: AC | PRN
  Administered 2017-11-06 (×2): 500 mL

## 2017-11-06 MED ORDER — LIDOCAINE HCL (PF) 1 % IJ SOLN
INTRAMUSCULAR | Status: DC | PRN
  Administered 2017-11-06: 2 mL via SUBCUTANEOUS

## 2017-11-06 MED ORDER — SODIUM CHLORIDE 0.9 % WEIGHT BASED INFUSION
3.0000 mL/kg/h | INTRAVENOUS | Status: AC
  Administered 2017-11-06: 3 mL/kg/h via INTRAVENOUS

## 2017-11-06 MED ORDER — SODIUM CHLORIDE 0.9% FLUSH: 3.0000 mL | INTRAVENOUS | Status: DC | PRN

## 2017-11-06 MED ORDER — FENTANYL CITRATE (PF) 100 MCG/2ML IJ SOLN
INTRAMUSCULAR | Status: DC | PRN
  Administered 2017-11-06: 25 ug via INTRAVENOUS

## 2017-11-06 MED ORDER — MIDAZOLAM HCL 2 MG/2ML IJ SOLN
INTRAMUSCULAR | Status: DC | PRN
  Administered 2017-11-06: 1 mg via INTRAVENOUS

## 2017-11-06 MED ORDER — ACETAMINOPHEN 325 MG PO TABS
650.0000 mg | ORAL_TABLET | ORAL | Status: DC | PRN
  Administered 2017-11-06: 650 mg via ORAL

## 2017-11-06 MED ORDER — ONDANSETRON HCL 4 MG/2ML IJ SOLN: 4.0000 mg | Freq: Four times a day (QID) | INTRAMUSCULAR | Status: DC | PRN

## 2017-11-06 MED ORDER — HEPARIN SODIUM (PORCINE) 1000 UNIT/ML IJ SOLN
INTRAMUSCULAR | Status: AC
  Filled 2017-11-06: qty 1

## 2017-11-06 MED ORDER — HEPARIN SODIUM (PORCINE) 1000 UNIT/ML IJ SOLN
INTRAMUSCULAR | Status: DC | PRN
  Administered 2017-11-06: 5000 [IU] via INTRAVENOUS
  Administered 2017-11-06: 3500 [IU] via INTRAVENOUS

## 2017-11-06 MED ORDER — LABETALOL HCL 5 MG/ML IV SOLN: 10.0000 mg | INTRAVENOUS | Status: DC | PRN

## 2017-11-06 MED ORDER — FENTANYL CITRATE (PF) 100 MCG/2ML IJ SOLN
INTRAMUSCULAR | Status: AC
  Filled 2017-11-06: qty 2

## 2017-11-06 MED ORDER — SODIUM CHLORIDE 0.9 % IV SOLN: 250.0000 mL | INTRAVENOUS | Status: DC | PRN

## 2017-11-06 MED ORDER — SODIUM CHLORIDE 0.9% FLUSH: 3.0000 mL | Freq: Two times a day (BID) | INTRAVENOUS | Status: DC

## 2017-11-06 MED ORDER — VERAPAMIL HCL 2.5 MG/ML IV SOLN
INTRAVENOUS | Status: DC | PRN
  Administered 2017-11-06: 10 mL via INTRA_ARTERIAL

## 2017-11-06 MED ORDER — ASPIRIN 81 MG PO CHEW: 81.0000 mg | CHEWABLE_TABLET | ORAL | Status: DC

## 2017-11-06 MED ORDER — ACETAMINOPHEN 325 MG PO TABS
ORAL_TABLET | ORAL | Status: AC
  Filled 2017-11-06: qty 2

## 2017-11-06 MED ORDER — SODIUM CHLORIDE 0.9 % WEIGHT BASED INFUSION: 1.0000 mL/kg/h | INTRAVENOUS | Status: DC

## 2017-11-06 MED ORDER — IOHEXOL 350 MG/ML SOLN
INTRAVENOUS | Status: DC | PRN
  Administered 2017-11-06: 95 mL via INTRA_ARTERIAL

## 2017-11-06 MED ORDER — MIDAZOLAM HCL 2 MG/2ML IJ SOLN
INTRAMUSCULAR | Status: AC
  Filled 2017-11-06: qty 2

## 2017-11-06 MED ORDER — VERAPAMIL HCL 2.5 MG/ML IV SOLN
INTRAVENOUS | Status: AC
  Filled 2017-11-06: qty 2

## 2017-11-06 MED ORDER — HEPARIN (PORCINE) IN NACL 1000-0.9 UT/500ML-% IV SOLN
INTRAVENOUS | Status: AC
  Filled 2017-11-06: qty 1000

## 2017-11-06 SURGICAL SUPPLY — 14 items
CATH 5FR JL3.5 JR4 ANG PIG MP (CATHETERS) ×2 IMPLANT
CATH VISTA GUIDE 6FR XBLAD3.5 (CATHETERS) ×2 IMPLANT
DEVICE RAD TR BAND REGULAR (VASCULAR PRODUCTS) ×2 IMPLANT
GLIDESHEATH SLEND SS 6F .021 (SHEATH) ×2 IMPLANT
GUIDEWIRE INQWIRE 1.5J.035X260 (WIRE) ×1 IMPLANT
GUIDEWIRE PRESSURE COMET II (WIRE) ×2 IMPLANT
INQWIRE 1.5J .035X260CM (WIRE) ×2
KIT ESSENTIALS PG (KITS) ×2 IMPLANT
KIT HEART LEFT (KITS) ×2 IMPLANT
PACK CARDIAC CATHETERIZATION (CUSTOM PROCEDURE TRAY) ×2 IMPLANT
SYR MEDRAD MARK V 150ML (SYRINGE) ×2 IMPLANT
TRANSDUCER W/STOPCOCK (MISCELLANEOUS) ×2 IMPLANT
TUBING CIL FLEX 10 FLL-RA (TUBING) ×2 IMPLANT
WIRE HI TORQ VERSACORE-J 145CM (WIRE) ×2 IMPLANT

## 2017-11-06 NOTE — Progress Notes (Signed)
CBG is 103 food tray given to pt

## 2017-11-06 NOTE — Interval H&P Note (Signed)
History and Physical Interval Note:  11/06/2017 2:03 PM  Janet Chapman  has presented today for cardiac cath with the diagnosis of unstable angina  The various methods of treatment have been discussed with the patient and family. After consideration of risks, benefits and other options for treatment, the patient has consented to  Procedure(s): LEFT HEART CATH AND CORONARY ANGIOGRAPHY (N/A) as a surgical intervention .  The patient's history has been reviewed, patient examined, no change in status, stable for surgery.  I have reviewed the patient's chart and labs.  Questions were answered to the patient's satisfaction.    Cath Lab Visit (complete for each Cath Lab visit)  Clinical Evaluation Leading to the Procedure:   ACS: No.  Non-ACS:    Anginal Classification: CCS III  Anti-ischemic medical therapy: Maximal Therapy (2 or more classes of medications)  Non-Invasive Test Results: No non-invasive testing performed  Prior CABG: No previous CABG         Lauree Chandler

## 2017-11-06 NOTE — Discharge Instructions (Signed)

## 2017-11-07 ENCOUNTER — Encounter (HOSPITAL_COMMUNITY): Payer: Self-pay | Admitting: Cardiovascular Disease

## 2017-11-07 MED FILL — Heparin Sod (Porcine)-NaCl IV Soln 1000 Unit/500ML-0.9%: INTRAVENOUS | Qty: 1000 | Status: AC

## 2017-11-09 ENCOUNTER — Other Ambulatory Visit: Payer: Self-pay | Admitting: Family Medicine

## 2017-11-16 ENCOUNTER — Encounter: Payer: Self-pay | Admitting: Family Medicine

## 2017-11-16 ENCOUNTER — Ambulatory Visit (INDEPENDENT_AMBULATORY_CARE_PROVIDER_SITE_OTHER): Payer: PPO | Admitting: Family Medicine

## 2017-11-16 ENCOUNTER — Other Ambulatory Visit: Payer: Self-pay | Admitting: Family Medicine

## 2017-11-16 VITALS — BP 130/70 | HR 53 | Temp 97.5°F | Ht 62.0 in | Wt 156.5 lb

## 2017-11-16 DIAGNOSIS — E088 Diabetes mellitus due to underlying condition with unspecified complications: Secondary | ICD-10-CM | POA: Diagnosis not present

## 2017-11-16 DIAGNOSIS — I25110 Atherosclerotic heart disease of native coronary artery with unstable angina pectoris: Secondary | ICD-10-CM | POA: Diagnosis not present

## 2017-11-16 DIAGNOSIS — R413 Other amnesia: Secondary | ICD-10-CM

## 2017-11-16 DIAGNOSIS — F068 Other specified mental disorders due to known physiological condition: Secondary | ICD-10-CM | POA: Diagnosis not present

## 2017-11-16 NOTE — Assessment & Plan Note (Signed)
Will re-eval with upcoming labs.

## 2017-11-16 NOTE — Progress Notes (Signed)
Subjective:    Patient ID: Janet Chapman, female    DOB: 1936-01-06, 82 y.o.   MRN: [id number]  HPI  82 year old female presents for follow up from heart cath.   11/06/2017 Dr. Glennie Hawk performed a left heart cth.  results copied as follows: 1. Large dominant RCA with mild mid stenosis 2. The Circumflex is a large caliber vessel with a large obtuse marginal branch and a small caliber intermediate branch. Mild non-obstructive disease noted in the Circumflex. First OM branch and intermediate branch.  3. The LAD is a moderate caliber vessel proximally with calcification noted in the entire proximal and mid vessel. The LAD tapers to a small caliber vessel (less than 2.0 mm) beyond the takeoff of the Diagonal branch. The mid LAD at the takeoff of the Diagonal branch has a moderately severe, heavily calcified stenosis. This appears to be a stable lesion. The Diagonal branch is a small to moderate caliber vessel with a moderate ostial stenosis and moderate mid stenosis. The LAD does not reach the apex.  4. Normal LV systolic function 5. Fractional flow reserve measurement of the mid LAD stenosis was 0.91 suggesting the stenosis is not flow limiting.   Recommendations: Medical management of CAD. The LAD lesion does not appear to be flow limiting. The mid and distal LAD appears to be too small for PCI if the lesion were significant. Consider adding a long acting Nitrate or Ranexa.   TODAY 11/16/17   Reviewed Cath in detail with pt. She reports she is still somewhat short winded overall, no chest pain. Now able to do more activity at home without DOE.   She is worried about memory loss progression.  Has upcoming memory test with CPX.  She is interested in  trial of medication.   Blood pressure 130/70, pulse (!) 53, temperature (!) 97.5 F (36.4 C), temperature source Oral, height 5\' 2"  (1.575 m), weight 156 lb 8 oz (71 kg). Social History /Family History/Past Medical History reviewed in detail  and updated in EMR if needed.   Review of Systems  Constitutional: Negative for fatigue and fever.  HENT: Negative for ear pain.   Eyes: Negative for pain.  Respiratory: Negative for chest tightness and shortness of breath.   Cardiovascular: Negative for chest pain, palpitations and leg swelling.  Gastrointestinal: Negative for abdominal pain.  Genitourinary: Negative for dysuria.       Objective:   Physical Exam  Constitutional: Vital signs are normal. She appears well-developed and well-nourished. She is cooperative.  Non-toxic appearance. She does not appear ill. No distress.  HENT:  Head: Normocephalic.  Right Ear: Hearing, tympanic membrane, external ear and ear canal normal. Tympanic membrane is not erythematous, not retracted and not bulging.  Left Ear: Hearing, tympanic membrane, external ear and ear canal normal. Tympanic membrane is not erythematous, not retracted and not bulging.  Nose: No mucosal edema or rhinorrhea. Right sinus exhibits no maxillary sinus tenderness and no frontal sinus tenderness. Left sinus exhibits no maxillary sinus tenderness and no frontal sinus tenderness.  Mouth/Throat: Uvula is midline, oropharynx is clear and moist and mucous membranes are normal.  Eyes: Pupils are equal, round, and reactive to light. Conjunctivae, EOM and lids are normal. Lids are everted and swept, no foreign bodies found.  Neck: Trachea normal and normal range of motion. Neck supple. Carotid bruit is not present. No thyroid mass and no thyromegaly present.  Cardiovascular: Normal rate, regular rhythm, S1 normal, S2 normal, normal heart sounds, intact  distal pulses and normal pulses. Exam reveals no gallop and no friction rub.  No murmur heard. Pulmonary/Chest: Effort normal and breath sounds normal. No tachypnea. No respiratory distress. She has no decreased breath sounds. She has no wheezes. She has no rhonchi. She has no rales.  Abdominal: Soft. Normal appearance and bowel  sounds are normal. There is no tenderness.  Neurological: She is alert.  Skin: Skin is warm, dry and intact. No rash noted.  Psychiatric: Her speech is normal and behavior is normal. Judgment and thought content normal. Her mood appears not anxious. Cognition and memory are normal. She does not exhibit a depressed mood.          Assessment & Plan:

## 2017-11-16 NOTE — Patient Instructions (Addendum)
Keep follow up as planned

## 2017-11-16 NOTE — Assessment & Plan Note (Signed)
Will re-eval with MMSE at wellness. Eval b12, cbc, vit d, CMET, etc. Consider MRI brain depending on extent of memory loss.  Consider trial of med ( SE to aricept in past)

## 2017-11-16 NOTE — Assessment & Plan Note (Signed)
Discussed heart healthy diet and risk factor modification in detail.

## 2017-11-19 ENCOUNTER — Ambulatory Visit (INDEPENDENT_AMBULATORY_CARE_PROVIDER_SITE_OTHER): Payer: PPO | Admitting: Family Medicine

## 2017-11-19 ENCOUNTER — Encounter: Payer: Self-pay | Admitting: Family Medicine

## 2017-11-19 VITALS — BP 134/69 | HR 56 | Temp 98.3°F | Ht 62.0 in | Wt 156.2 lb

## 2017-11-19 DIAGNOSIS — M7581 Other shoulder lesions, right shoulder: Secondary | ICD-10-CM

## 2017-11-19 DIAGNOSIS — M19011 Primary osteoarthritis, right shoulder: Secondary | ICD-10-CM

## 2017-11-19 DIAGNOSIS — M25511 Pain in right shoulder: Secondary | ICD-10-CM

## 2017-11-19 NOTE — Progress Notes (Signed)
Dr. Frederico Hamman T. Laray Corbit, MD, [facility] [place], [address] Phone: [phone] Fax: [phone]  11/19/2017  Patient: Janet Chapman, MRN: [id number], DOB: [birth year], 82 y.o.  Primary Physician:  [name], MD   Chief Complaint  Patient presents with  . Shoulder Pain    Right    Subjective:   Janet Chapman is a 82 y.o. very pleasant female patient who presents with the following:  R ongoing shoulder pain, failure to improve with PT and subac injection.  She is not really presented with some symptoms in late April 2019, and I have seen her now for 4 times, and she has done physical therapy, and additionally had a subacromial injection which provided relief from a short amount of time.  She also was having some shortness of breath and had a cardiac work-up including cardiac catheterization which showed only some moderate disease which is been managed medically.  Shoulder x-rays previously did show some mild glenohumeral osteoarthritis as well as some AC joint osteoarthritis.  There also is some undersurface spurring at the acromio clavicular joint.  Chest x-rayMRI  Past Medical History, Surgical History, Social History, Family History, Problem List, Medications, and Allergies have been reviewed and updated if relevant.  Patient Active Problem List   Diagnosis Date Noted  . Coronary artery disease involving native coronary artery of native heart with unstable angina pectoris ([place])   . Diabetes mellitus due to underlying condition with unspecified complications ([place]) 55/73/2202  . DOE (dyspnea on exertion) 11/02/2017  . Bradycardia 06/12/2017  . Palpitations 06/12/2017  . Fatigue 09/12/2016  . Spider varicose veins 07/22/2015  . Advanced care planning/counseling discussion 07/08/2015  . 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 Chronic radicular low back pain 01/02/2014  . Atrophic  vaginitis 09/26/2013  . Pulmonary nodule, right 05/23/2013  . Tinea pedis 12/20/2012  . Osteopenia 07/26/2012  . INSOMNIA, CHRONIC 01/14/2010  . Diabetes mellitus, controlled ([place]) 11/11/2008  . MAMMOGRAM, ABNORMAL, LEFT 11/09/2008  . Chronic idiopathic constipation 09/11/2007  . OBESITY 05/31/2007  . Allergic rhinitis 05/31/2007  . GERD 05/31/2007  . OSTEOARTHRITIS 05/31/2007  . URINARY INCONTINENCE 05/31/2007  . High cholesterol 04/19/2007  . Essential hypertension, benign 04/19/2007  . ACTINIC KERATOSIS 02/01/2007    Past Medical History:  Diagnosis Date  . Allergic rhinitis   . Diabetes mellitus without complication (HCC)    diet controlled  . GERD (gastroesophageal reflux disease)   . Hyperlipidemia   . Hypertension   . Osteoarthritis   . Urinary incontinence     Past Surgical History:  Procedure Laterality Date  . ABDOMINAL HYSTERECTOMY  1993   TA  . CARDIOVASCULAR STRESS TEST  2004   [facility]. [facility], cardiolite-normal  . CHOLECYSTECTOMY  [phone]  . COLONOSCOPY    . LEFT HEART CATH AND CORONARY ANGIOGRAPHY N/A 11/06/2017   Procedure: LEFT HEART CATH AND CORONARY ANGIOGRAPHY;  Surgeon: Burnell Blanks, MD;  Location: [facility];  Service: Cardiovascular;  Laterality: N/A;    Social History   Socioeconomic History  . Marital status: Married    Spouse name: Not on file  . Number of children: 0  . Years of education: Not on file  . Highest education level: Not on file  Occupational History  . Occupation: retired  Scientific laboratory technician  . Financial resource strain: Not on file  . Food insecurity:    Worry: Not on file  Inability: Not on file  . Transportation needs:    Medical: Not on file    Non-medical: Not on file  Tobacco Use  . Smoking status: Never Smoker  . Smokeless tobacco: Never Used  Substance and Sexual Activity  . Alcohol use: Yes    Comment: rarely  . Drug use: No  . Sexual activity: Never  Lifestyle  . Physical activity:    Days  per week: Not on file    Minutes per session: Not on file  . Stress: Not on file  Relationships  . Social connections:    Talks on phone: Not on file    Gets together: Not on file    Attends religious service: Not on file    Active member of club or organization: Not on file    Attends meetings of clubs or organizations: Not on file    Relationship status: Not on file  . Intimate partner violence:    Fear of current or ex partner: Not on file    Emotionally abused: Not on file    Physically abused: Not on file    Forced sexual activity: Not on file  Other Topics Concern  . Not on file  Social History Narrative    Regular exercise: yes, Curves 2x a week   Married 48 + years    Diet: fruit and veggies, no FF, some water, artificial sweetener   HCPOA: [name], has living will, full code ( reviewed 2014)             Family History  Problem Relation Age of Onset  . Heart attack Father 1  . Dementia Mother   . Stroke Mother 15  . Ovarian cancer Sister 75  . Diabetes Unknown        Maternal side  . Ovarian cancer Unknown        Grandmother  . Ovarian cancer Unknown        aunt  . Colon cancer Neg Hx     Allergies  Allergen Reactions  . Codeine Nausea Only    Medication list reviewed and updated in full in [facility].  GEN: No fevers, chills. Nontoxic. Primarily MSK c/o today. MSK: Detailed in the HPI GI: tolerating PO intake without difficulty Neuro: No numbness, parasthesias, or tingling associated. Otherwise the pertinent positives of the ROS are noted above.   Objective:   BP 134/69   Pulse (!) 56   Temp 98.3 F (36.8 C) (Oral)   Ht 5\' 2"  (1.575 m)   Wt 156 lb 4 oz (70.9 kg)   BMI 28.58 kg/m    GEN: Well-developed,well-nourished,in no acute distress; alert,appropriate and cooperative throughout examination HEENT: Normocephalic and atraumatic without obvious abnormalities. Ears, externally no deformities PULM: Breathing comfortably in no  respiratory distress EXT: No clubbing, cyanosis, or edema PSYCH: Normally interactive. Cooperative during the interview. Pleasant. Friendly and conversant. Not anxious or depressed appearing. Normal, full affect.  Shoulder: R Inspection: No muscle wasting or winging Ecchymosis/edema: neg  AC joint, scapula, clavicle: NT Cervical spine: NT, full ROM Spurling's: neg Abduction: full, 5/5 Flexion: full, 5/5 IR, full, lift-off: 5/5 ER at neutral: full, 5/5 AC crossover: neg Neer: pos Hawkins: pos Drop Test: neg Empty Can: pos Supraspinatus insertion: mild-mod T Bicipital groove: NT Speed's: neg Yergason's: neg Sulcus sign: neg Scapular dyskinesis: none C5-T1 intact  Neuro: Sensation intact Grip 5/5   Radiology: CLINICAL DATA:  Right shoulder pain for week.  EXAM: RIGHT SHOULDER - 2+ VIEW  COMPARISON:  None.  FINDINGS: Intact AC and glenohumeral joints. Spurring off the lateral aspect of the acromion may predispose the patient to rotator cuff tears. No acute fracture nor joint dislocation. The adjacent ribs and lung are nonacute. Mild soft tissue prominence on the axillary view anteriorly.  IMPRESSION: Lateral acromial spur which may contribute to rotator cuff tears. No acute osseous abnormality. Soft tissue prominence seen anteriorly on the axillary view, nonspecific.   Electronically Signed   By: Ashley Royalty M.D.   On: 09/13/2017 02:27   Assessment and Plan:   Rotator cuff tendonitis, right  Acute pain of right shoulder - Plan: MR Shoulder Right Wo Contrast  Right shoulder pain, unspecified chronicity  Glenohumeral arthritis, right   Subacromial impingement pathology none with some glenohumeral arthritis in a patient with rotator cuff tendinopathy and failure to improve x3 months of conservative care.  This includes physical therapy, subacromial corticosteroid injection, and she continues to have impairment of activities of daily living and quality  of life.  Obtain an MRI of the right shoulder without contrast to evaluate for potential partial-thickness rotator cuff tear, full-thickness rotator cuff tear, or other derangement.  Follow-up: based on MRI findings  Orders Placed This Encounter  Procedures  . MR Shoulder Right Wo Contrast    Signed,  Nayel Purdy T. Madissen Wyse, MD   Allergies as of 11/19/2017      Reactions   Codeine Nausea Only      Medication List        Accurate as of 11/19/17  2:05 PM. Always use your most recent med list.          aspirin 81 MG tablet Take 81 mg by mouth at bedtime.   CoQ10 100 MG Caps Take 100 mg by mouth daily.   diltiazem 120 MG 24 hr capsule Commonly known as:  CARDIZEM CD TAKE 1 TABLET BY MOUTH DAILY   losartan-hydrochlorothiazide 100-25 MG tablet Commonly known as:  HYZAAR TAKE 1 TABLET BY MOUTH DAILY   metoprolol succinate 50 MG 24 hr tablet Commonly known as:  TOPROL-XL TAKE 1 TABLET BY MOUTH DAILY   simvastatin 40 MG tablet Commonly known as:  ZOCOR TAKE 1 TABLET BY MOUTH EACH NIGHT AT BEDTIME   traMADol 50 MG tablet Commonly known as:  Veatrice Bourbon

## 2017-11-19 NOTE — Patient Instructions (Signed)
REFERRALS TO SPECIALISTS, SPECIAL TESTS (MRI, CT, ULTRASOUNDS)  Janet Chapman or  Janet Chapman will help you. ASK CHECK-IN FOR HELP.  Specialist appointment times vary a great deal, based on their schedule / openings. -- Some specialists have very long wait times. (Example. Dermatology)    

## 2017-11-23 ENCOUNTER — Ambulatory Visit (HOSPITAL_COMMUNITY)
Admission: RE | Admit: 2017-11-23 | Discharge: 2017-11-23 | Disposition: A | Discharge status: Home / Self Care | Payer: PPO | Source: Ambulatory Visit | Attending: Family Medicine | Admitting: Family Medicine

## 2017-11-23 DIAGNOSIS — M25511 Pain in right shoulder: Secondary | ICD-10-CM | POA: Diagnosis not present

## 2017-11-23 DIAGNOSIS — M19011 Primary osteoarthritis, right shoulder: Secondary | ICD-10-CM | POA: Insufficient documentation

## 2017-11-23 DIAGNOSIS — M75121 Complete rotator cuff tear or rupture of right shoulder, not specified as traumatic: Secondary | ICD-10-CM | POA: Diagnosis not present

## 2017-11-23 DIAGNOSIS — R609 Edema, unspecified: Secondary | ICD-10-CM | POA: Diagnosis not present

## 2017-11-23 DIAGNOSIS — M25411 Effusion, right shoulder: Secondary | ICD-10-CM | POA: Diagnosis not present

## 2017-11-27 ENCOUNTER — Other Ambulatory Visit: Payer: Self-pay | Admitting: Family Medicine

## 2017-11-27 DIAGNOSIS — M75121 Complete rotator cuff tear or rupture of right shoulder, not specified as traumatic: Secondary | ICD-10-CM

## 2017-11-27 NOTE — Progress Notes (Signed)
done

## 2017-12-03 DIAGNOSIS — M75121 Complete rotator cuff tear or rupture of right shoulder, not specified as traumatic: Secondary | ICD-10-CM | POA: Diagnosis not present

## 2017-12-04 ENCOUNTER — Ambulatory Visit (INDEPENDENT_AMBULATORY_CARE_PROVIDER_SITE_OTHER): Payer: PPO

## 2017-12-04 ENCOUNTER — Ambulatory Visit: Payer: PPO

## 2017-12-04 VITALS — BP 142/80 | HR 53 | Temp 97.6°F | Ht 62.0 in | Wt 156.8 lb

## 2017-12-04 DIAGNOSIS — M8580 Other specified disorders of bone density and structure, unspecified site: Secondary | ICD-10-CM | POA: Diagnosis not present

## 2017-12-04 DIAGNOSIS — I10 Essential (primary) hypertension: Secondary | ICD-10-CM

## 2017-12-04 DIAGNOSIS — Z0000 Encounter for general adult medical examination without abnormal findings: Secondary | ICD-10-CM | POA: Diagnosis not present

## 2017-12-04 DIAGNOSIS — E119 Type 2 diabetes mellitus without complications: Secondary | ICD-10-CM

## 2017-12-04 LAB — T4, FREE: Free T4: 0.86 ng/dL (ref 0.60–1.60)

## 2017-12-04 LAB — TSH: TSH: 1.94 u[IU]/mL (ref 0.35–4.50)

## 2017-12-04 LAB — VITAMIN D 25 HYDROXY (VIT D DEFICIENCY, FRACTURES): VITD: 45.87 ng/mL (ref 30.00–100.00)

## 2017-12-04 LAB — HEMOGLOBIN A1C: Hgb A1c MFr Bld: 6.3 % (ref 4.6–6.5)

## 2017-12-04 LAB — T3, FREE: T3, Free: 2.6 pg/mL (ref 2.3–4.2)

## 2017-12-04 NOTE — Patient Instructions (Signed)
Ms. Hoiland , Thank you for taking time to come for your Medicare Wellness Visit. I appreciate your ongoing commitment to your health goals. Please review the following plan we discussed and let me know if I can assist you in the future.   These are the goals we discussed: Goals    . Patient Stated     Starting 12/04/2017, I will continue to take medications as prescribed.        This is a list of the screening recommended for you and due dates:  Health Maintenance  Topic Date Due  . Mammogram  09/12/2018*  . Tetanus Vaccine  12/05/2018*  . Hemoglobin A1C  12/05/2017  . Eye exam for diabetics  12/12/2017  . Flu Shot  12/13/2017  . Complete foot exam   06/12/2018  . DEXA scan (bone density measurement)  Completed  . Pneumonia vaccines  Completed  *Topic was postponed. The date shown is not the original due date.   Preventive Care for Adults  A healthy lifestyle and preventive care can promote health and wellness. Preventive health guidelines for adults include the following key practices.  . A routine yearly physical is a good way to check with your health care provider about your health and preventive screening. It is a chance to share any concerns and updates on your health and to receive a thorough exam.  . Visit your dentist for a routine exam and preventive care every 6 months. Brush your teeth twice a day and floss once a day. Good oral hygiene prevents tooth decay and gum disease.  . The frequency of eye exams is based on your age, health, family medical history, use  of contact lenses, and other factors. Follow your health care provider's recommendations for frequency of eye exams.  . Eat a healthy diet. Foods like vegetables, fruits, whole grains, low-fat dairy products, and lean protein foods contain the nutrients you need without too many calories. Decrease your intake of foods high in solid fats, added sugars, and salt. Eat the right amount of calories for you. Get  information about a proper diet from your health care provider, if necessary.  . Regular physical exercise is one of the most important things you can do for your health. Most adults should get at least 150 minutes of moderate-intensity exercise (any activity that increases your heart rate and causes you to sweat) each week. In addition, most adults need muscle-strengthening exercises on 2 or more days a week.  Silver Sneakers may be a benefit available to you. To determine eligibility, you may visit the website: www.silversneakers.com or contact program at (419) 694-0125 Mon-Fri between 8AM-8PM.   . Maintain a healthy weight. The body mass index (BMI) is a screening tool to identify possible weight problems. It provides an estimate of body fat based on height and weight. Your health care provider can find your BMI and can help you achieve or maintain a healthy weight.   For adults 20 years and older: ? A BMI below 18.5 is considered underweight. ? A BMI of 18.5 to 24.9 is normal. ? A BMI of 25 to 29.9 is considered overweight. ? A BMI of 30 and above is considered obese.   . Maintain normal blood lipids and cholesterol levels by exercising and minimizing your intake of saturated fat. Eat a balanced diet with plenty of fruit and vegetables. Blood tests for lipids and cholesterol should begin at age 70 and be repeated every 5 years. If your lipid or cholesterol  levels are high, you are over 50, or you are at high risk for heart disease, you may need your cholesterol levels checked more frequently. Ongoing high lipid and cholesterol levels should be treated with medicines if diet and exercise are not working.  . If you smoke, find out from your health care provider how to quit. If you do not use tobacco, please do not start.  . If you choose to drink alcohol, please do not consume more than 2 drinks per day. One drink is considered to be 12 ounces (355 mL) of beer, 5 ounces (148 mL) of wine, or 1.5  ounces (44 mL) of liquor.  . If you are 62-50 years old, ask your health care provider if you should take aspirin to prevent strokes.  . Use sunscreen. Apply sunscreen liberally and repeatedly throughout the day. You should seek shade when your shadow is shorter than you. Protect yourself by wearing long sleeves, pants, a wide-brimmed hat, and sunglasses year round, whenever you are outdoors.  . Once a month, do a whole body skin exam, using a mirror to look at the skin on your back. Tell your health care provider of new moles, moles that have irregular borders, moles that are larger than a pencil eraser, or moles that have changed in shape or color.

## 2017-12-04 NOTE — Progress Notes (Signed)
Subjective:   Janet Chapman is a 82 y.o. female who presents for Medicare Annual (Subsequent) preventive examination.  Review of Systems:  N/A Cardiac Risk Factors include: advanced age (>65men, >27 women);hypertension     Objective:     Vitals: BP (!) 142/80 (BP Location: Right Arm, Patient Position: Sitting, Cuff Size: Normal) Comment: BP meds Janet taken  Pulse (!) 53   Temp 97.6 F (36.4 C) (Oral)   Ht 5\' 2"  (1.575 m) Comment: no shoes  Wt 156 lb 12 oz (71.1 kg)   SpO2 97%   BMI 28.67 kg/m   Body mass index is 28.67 kg/m.  Advanced Directives 12/04/2017 11/06/2017 11/27/2016 12/02/2015  Does Patient Have a Medical Advance Directive? Yes Yes Yes No;Yes  Type of Paramedic of [place];Living will [place];Living will [place];Living will Living will  Does patient want to make changes to medical advance directive? - No - Patient declined - -  Copy of [place] in Chart? No - copy requested No - copy requested No - copy requested No - copy requested  Would patient like information on creating a medical advance directive? - - - No - patient declined information    Tobacco Social History   Tobacco Use  Smoking Status Never Smoker  Smokeless Tobacco Never Used     Counseling given: No   Clinical Intake:  Pre-visit preparation completed: Yes  Pain Score: 2      Nutritional Status: BMI 25 -29 Overweight Nutritional Risks: None Diabetes: Yes CBG done?: No Did pt. bring in CBG monitor from home?: No  How often do you need to have someone help you when you read instructions, pamphlets, or other written materials from your doctor or pharmacy?: 1 - Never What is the last grade level you completed in school?: Bachelors degree  Interpreter Needed?: No  Comments: pt lives with spouse Information entered by :: LPinson, LPN  Past Medical History:  Diagnosis Date  . Allergic rhinitis     . Diabetes mellitus without complication (HCC)    diet controlled  . GERD (gastroesophageal reflux disease)   . Hyperlipidemia   . Hypertension   . Osteoarthritis   . Urinary incontinence    Past Surgical History:  Procedure Laterality Date  . ABDOMINAL HYSTERECTOMY  1993   TA  . CARDIOVASCULAR STRESS TEST  2004   H. Smith, cardiolite-normal  . CHOLECYSTECTOMY  [phone]  . COLONOSCOPY    . LEFT HEART CATH AND CORONARY ANGIOGRAPHY N/A 11/06/2017   Procedure: LEFT HEART CATH AND CORONARY ANGIOGRAPHY;  Surgeon: Burnell Blanks, MD;  Location: [facility];  Service: Cardiovascular;  Laterality: N/A;   Family History  Problem Relation Age of Onset  . Heart attack Father 31  . Dementia Mother   . Stroke Mother 66  . Ovarian cancer Sister 56  . Diabetes Unknown        Maternal side  . Ovarian cancer Unknown        Grandmother  . Ovarian cancer Unknown        aunt  . Colon cancer Neg Hx    Social History   Socioeconomic History  . Marital status: Married    Spouse name: Janet Chapman  . Number of children: 0  . Years of education: Janet Chapman  . Highest education level: Janet Chapman  Occupational History  . Occupation: retired  Scientific laboratory technician  . Financial resource strain:  Janet Chapman  . Food insecurity:    Worry: Janet Chapman    Inability: Janet Chapman  . Transportation needs:    Medical: Janet Chapman    Non-medical: Janet Chapman  Tobacco Use  . Smoking status: Never Smoker  . Smokeless tobacco: Never Used  Substance and Sexual Activity  . Alcohol use: Yes    Comment: 1 glass of wine per month  . Drug use: No  . Sexual activity: Never  Lifestyle  . Physical activity:    Days per week: Janet Chapman    Minutes per session: Janet Chapman  . Stress: Janet Chapman  Relationships  . Social connections:    Talks on phone: Janet Chapman    Gets together: Janet Chapman    Attends religious service: Janet Chapman    Active member of club or organization: Janet Chapman     Attends meetings of clubs or organizations: Janet Chapman    Relationship status: Janet Chapman  Other Topics Concern  . Janet Chapman  Social History Narrative    Regular exercise: yes, Curves 2x a week   Married 48 + years    Diet: fruit and veggies, no FF, some water, artificial sweetener   HCPOA: Janet Chapman, has living will, full code ( reviewed 2014)             Outpatient Encounter Medications as of 12/04/2017  Medication Sig  . aspirin 81 MG tablet Take 81 mg by mouth at bedtime.   . Calcium Citrate-Vitamin D (CALCIUM + D PO) Take 1 tablet by mouth daily.  Marland Kitchen CINNAMON PO Take 1 capsule by mouth daily.  . Coenzyme Q10 (COQ10) 100 MG CAPS Take 100 mg by mouth daily.  . Cyanocobalamin (B-12 PO) Take 1 capsule by mouth daily.  Marland Kitchen diltiazem (CARDIZEM CD) 120 MG 24 hr capsule TAKE 1 TABLET BY MOUTH DAILY  . FEXOFENADINE HCL PO Take 1 tablet by mouth daily.  Marland Kitchen KRILL OIL PO Take 1 capsule by mouth daily.  Marland Kitchen losartan-hydrochlorothiazide (HYZAAR) 100-25 MG tablet TAKE 1 TABLET BY MOUTH DAILY  . metoprolol succinate (TOPROL-XL) 50 MG 24 hr tablet TAKE 1 TABLET BY MOUTH DAILY  . Multiple Vitamins-Minerals (MULTIVITAMIN PO) Take 1 tablet by mouth daily.  . simvastatin (ZOCOR) 40 MG tablet TAKE 1 TABLET BY MOUTH EACH NIGHT AT BEDTIME  . traMADol (ULTRAM) 50 MG tablet    No facility-administered encounter medications on Chapman as of 12/04/2017.     Activities of Daily Living In your present state of health, do you have any difficulty performing the following activities: 12/04/2017  Hearing? N  Vision? N  Difficulty concentrating or making decisions? Y  Walking or climbing stairs? Y  Dressing or bathing? N  Doing errands, shopping? N  Preparing Food and eating ? N  Using the Toilet? N  In the past six months, have you accidently leaked urine? Y  Do you have problems with loss of bowel control? N  Managing your Medications? N  Managing your Finances? N  Housekeeping or managing your  Housekeeping? N  Some recent data might be hidden    Patient Care Team: [name], MD as PCP - General Jerline Pain Mingo Amber, DO (Optometry)    Assessment:   This is a routine wellness examination for [name].   Hearing Screening   125Hz  250Hz  500Hz  1000Hz  2000Hz  3000Hz  4000Hz  6000Hz  8000Hz   Right ear:  40 40 40  0    Left ear:   40 40 40  40    Vision Screening Comments: Vision exam in July 2018    Exercise Activities and Dietary recommendations Current Exercise Habits: The patient does Janet participate in regular exercise at present, Exercise limited by: None identified  Goals    . Patient Stated     Starting 12/04/2017, I will continue to take medications as prescribed.        Fall Risk Fall Risk  12/04/2017 11/27/2016 10/01/2015 07/08/2015 07/09/2014  Falls in the past year? No No No No No   Depression Screen PHQ 2/9 Scores 12/04/2017 12/14/2016 11/27/2016 10/01/2015  PHQ - 2 Score 0 0 0 0  PHQ- 9 Score 0 - - -     Cognitive Function MMSE - Mini Mental State Exam 12/04/2017 11/27/2016  Orientation to time 5 5  Orientation to Place 5 5  Registration 3 3  Attention/ Calculation 0 0  Recall 3 2  Language- name 2 objects 0 0  Language- repeat 1 1  Language- follow 3 step command 3 3  Language- read & follow direction 0 0  Write a sentence 0 0  Copy design 0 0  Total score 20 19     PLEASE NOTE: A Mini-Cog screen was completed. Maximum score is 20. A value of 0 denotes this part of Folstein MMSE was Janet completed or the patient failed this part of the Mini-Cog screening.   Mini-Cog Screening Orientation to Time - Max 5 pts Orientation to Place - Max 5 pts Registration - Max 3 pts Recall - Max 3 pts Language Repeat - Max 1 pts Language Follow 3 Step Command - Max 3 pts     Immunization History  Administered Date(s) Administered  . Influenza Split 03/16/2011, 02/21/2012, 02/06/2013  . Influenza Whole 02/13/2007, 03/12/2008, 02/09/2009, 02/12/2009  . Influenza, High  Dose Seasonal PF 02/19/2017  . Influenza, Seasonal, Injecte, Preservative Fre 02/17/2014  . Influenza,inj,Quad PF,6+ Mos 01/07/2015, 02/03/2016  . Pneumococcal Conjugate-13 06/26/2013  . Pneumococcal Polysaccharide-23 05/16/2003, 05/20/2010  . Td 04/19/2007  . Zoster 05/20/2010    Screening Tests Health Maintenance  Topic Date Due  . MAMMOGRAM  09/12/2018 (Originally 08/17/2017)  . TETANUS/TDAP  12/05/2018 (Originally 04/18/2017)  . HEMOGLOBIN A1C  12/05/2017  . OPHTHALMOLOGY EXAM  12/12/2017  . INFLUENZA VACCINE  12/13/2017  . FOOT EXAM  06/12/2018  . DEXA SCAN  Completed  . PNA vac Low Risk Adult  Completed       Plan:     I have personally reviewed, addressed, and noted the following in the patient's chart:  A. Medical and social history B. Use of alcohol, tobacco or illicit drugs  C. Current medications and supplements D. Functional ability and status E.  Nutritional status F.  Physical activity G. Advance directives H. List of other physicians I.  Hospitalizations, surgeries, and ER visits in previous 12 months J.  [place] to include hearing, vision, cognitive, depression L. Referrals and appointments - none  In addition, I have reviewed and discussed with patient certain preventive protocols, quality metrics, and best practice recommendations. A written personalized care plan for preventive services as well as general preventive health recommendations were provided to patient.  See attached scanned questionnaire for additional information.   Signed,   Lindell Noe, MHA, BS, LPN Health Coach

## 2017-12-04 NOTE — Progress Notes (Signed)
I reviewed health advisor's note, was available for consultation, and agree with documentation and plan.  

## 2017-12-04 NOTE — Progress Notes (Signed)
PCP notes:   Health maintenance:  Mammogram - addressed; PCP follow-up needed Tetanus vaccine - postponed/insurance   Abnormal screenings:   Hearing - failed  Hearing Screening   125Hz  250Hz  500Hz  1000Hz  2000Hz  3000Hz  4000Hz  6000Hz  8000Hz   Right ear:   40 40 40  0    Left ear:   40 40 40  40     Patient concerns:   Allergies - pt states they are becoming worse  Mammogram - pt wants to discuss if she should continue with breast cancer screenings  Nurse concerns:  None  Next PCP appt:   12/06/17 @ 1000

## 2017-12-06 ENCOUNTER — Encounter: Payer: Self-pay | Admitting: Family Medicine

## 2017-12-06 ENCOUNTER — Ambulatory Visit (INDEPENDENT_AMBULATORY_CARE_PROVIDER_SITE_OTHER): Payer: PPO | Admitting: Family Medicine

## 2017-12-06 VITALS — BP 152/76 | HR 60 | Temp 98.1°F | Ht 62.0 in | Wt 157.5 lb

## 2017-12-06 DIAGNOSIS — M8580 Other specified disorders of bone density and structure, unspecified site: Secondary | ICD-10-CM | POA: Diagnosis not present

## 2017-12-06 DIAGNOSIS — E7800 Pure hypercholesterolemia, unspecified: Secondary | ICD-10-CM

## 2017-12-06 DIAGNOSIS — E119 Type 2 diabetes mellitus without complications: Secondary | ICD-10-CM

## 2017-12-06 DIAGNOSIS — I25110 Atherosclerotic heart disease of native coronary artery with unstable angina pectoris: Secondary | ICD-10-CM | POA: Diagnosis not present

## 2017-12-06 DIAGNOSIS — Z0000 Encounter for general adult medical examination without abnormal findings: Secondary | ICD-10-CM

## 2017-12-06 DIAGNOSIS — I10 Essential (primary) hypertension: Secondary | ICD-10-CM

## 2017-12-06 DIAGNOSIS — F068 Other specified mental disorders due to known physiological condition: Secondary | ICD-10-CM

## 2017-12-06 DIAGNOSIS — R413 Other amnesia: Secondary | ICD-10-CM

## 2017-12-06 LAB — HM DIABETES FOOT EXAM

## 2017-12-06 NOTE — Assessment & Plan Note (Signed)
Followed by cards. LDL goal < 70.

## 2017-12-06 NOTE — Assessment & Plan Note (Signed)
Inadeqate control.. Will follow at home... Will call with measurements or discuss with cards at upcoming [place].

## 2017-12-06 NOTE — Progress Notes (Signed)
Subjective:    Patient ID: Janet Chapman, female    DOB: January 13, 1936, 82 y.o.   MRN: [id number]  HPI The patient presents for annual medicare wellness, complete physical and review of chronic health problems. He/She also has the following acute concerns today:  The patient saw Candis Musa, LPN for medicare wellness. Note reviewed in detail and important notes copied below.  Health maintenance:  Mammogram - addressed; PCP follow-up needed Tetanus vaccine - postponed/insurance   Abnormal screenings:   Hearing - failed             Hearing Screening   125Hz  250Hz  500Hz  1000Hz  2000Hz  3000Hz  4000Hz  6000Hz  8000Hz   Right ear:   40 40 40  0    Left ear:   40 40 40  40     Patient concerns:   Allergies - pt states they are becoming worse  Mammogram - pt wants to discuss if she should continue with breast cancer screenings  12/06/17 TODAY  Hypertension:    Inadequate control on diltiazem, metoprolol, losartanHCTZ BP Readings from Last 3 Encounters:  12/06/17 (!) 152/76  12/04/17 (!) 142/80  11/19/17 134/69  Using medication without problems or lightheadedness:  Chest pain with exertion:none Edema:none Short of breath: short winded with exercise, stable Average home BPs: not checking Other issues: CAD followed by Cards Dr. Glennie Hawk   Memory Loss: Lab eval  Showed nml thyroid, B12, vit d  She continue to note issues, mild... May be decreased hearing on right as that is where husband talks.  Nml MMSE at [employer]. No clear indication for MRI brain.. No neuro changes, no headache.  Diabetes:   Improved control with diet changes. Lab Results  Component Value Date   HGBA1C 6.3 12/04/2017  Using medications without difficulties: Hypoglycemic episodes: Hyperglycemic episodes: Feet problems: no ulcers Blood Sugars averaging:not checking eye exam within last year:12/12/2016   Elevated Cholesterol:  Due for re-eval on simvastatin (plan repeat in 6  months with DM check).. LDL goal now at < 70. Lab Results  Component Value Date   CHOL 176 06/07/2017   HDL 77.50 06/07/2017   LDLCALC 87 06/07/2017   LDLDIRECT 119.8 04/26/2009   TRIG 58.0 06/07/2017   CHOLHDL 2 06/07/2017  Using medications without problems: Muscle aches:  Diet compliance: good Exercise: walking Other complaints:     Social History /Family History/Past Medical History reviewed in detail and updated in EMR if needed. Blood pressure (!) 152/76, pulse 60, temperature 98.1 F (36.7 C), temperature source Oral, height 5\' 2"  (1.575 m), weight 157 lb 8 oz (71.4 kg), SpO2 96 %.  Review of Systems  Constitutional: Negative for fatigue and fever.  HENT: Positive for postnasal drip and rhinorrhea. Negative for congestion.   Eyes: Negative for pain.  Respiratory: Negative for cough and shortness of breath.   Cardiovascular: Negative for chest pain, palpitations and leg swelling.  Gastrointestinal: Negative for abdominal pain.  Genitourinary: Negative for dysuria and vaginal bleeding.  Musculoskeletal: Negative for back pain.  Neurological: Negative for syncope, light-headedness and headaches.  Psychiatric/Behavioral: Negative for dysphoric mood.       Objective:   Physical Exam  Constitutional: Vital signs are normal. She appears well-developed and well-nourished. She is cooperative.  Non-toxic appearance. She does not appear ill. No distress.  HENT:  Head: Normocephalic.  Right Ear: Hearing, tympanic membrane, external ear and ear canal normal.  Left Ear: Hearing, tympanic membrane, external ear and ear canal normal.  Nose: Nose normal.  Eyes: Pupils  are equal, round, and reactive to light. Conjunctivae, EOM and lids are normal. Lids are everted and swept, no foreign bodies found.  Neck: Trachea normal and normal range of motion. Neck supple. Carotid bruit is not present. No thyroid mass and no thyromegaly present.  Cardiovascular: Normal rate, regular rhythm, S1  normal, S2 normal, normal heart sounds and intact distal pulses. Exam reveals no gallop.  No murmur heard. Pulmonary/Chest: Effort normal and breath sounds normal. No respiratory distress. She has no wheezes. She has no rhonchi. She has no rales.  Abdominal: Soft. Normal appearance and bowel sounds are normal. She exhibits no distension, no fluid wave, no abdominal bruit and no mass. There is no hepatosplenomegaly. There is no tenderness. There is no rebound, no guarding and no CVA tenderness. No hernia.  Lymphadenopathy:    She has no cervical adenopathy.    She has no axillary adenopathy.  Neurological: She is alert. She has normal strength. No cranial nerve deficit or sensory deficit.  Skin: Skin is warm, dry and intact. No rash noted.  Psychiatric: Her speech is normal and behavior is normal. Judgment normal. Her mood appears not anxious. Cognition and memory are normal. She does not exhibit a depressed mood.          Assessment & Plan:  The patient's preventative maintenance and recommended screening tests for an annual wellness exam were reviewed in full today. Brought up to date unless services declined.  Counselled on the importance of diet, exercise, and its role in overall health and mortality. The patient's FH and SH was reviewed, including their home life, tobacco status, and drug and alcohol status.   Vaccines: Uptodate with flu, Td, shingles and pneumovax, prevnar  Mammo: 3D Had 4/18 nml, no family history of breast cancer... Wishes to continue after every 2 year. DVE: pap and DVE not indicated. TAH. Sister with ovarian cancer  DEXA: Normal, 08/2016 repeat 5 year work on walking, Ca and Vit D Colon: 02/01/2005 Nml, Dr. Wynetta Emery, 12/2016 cologuard Nonsmoker

## 2017-12-06 NOTE — Assessment & Plan Note (Addendum)
Good control on  No med. Encouraged exercise, weight loss, healthy eating habits.  

## 2017-12-06 NOTE — Assessment & Plan Note (Signed)
Almost at goal LDL< 70  On statin in 05/2017.. Will re-check in 6 months unless cards requests earlier.

## 2017-12-06 NOTE — Patient Instructions (Addendum)
Follow BP at home in next 1-2 weeks.. Call with measurements or discuss with cardiologist.. If > 140/90 we will adjust medication. Set up yearly eye exam. Can try trial of Xyzal and flonase nasal spray for allergies.  Can try a trial of prevagen for memory.

## 2017-12-06 NOTE — Assessment & Plan Note (Signed)
Lab eval  Showed nml thyroid, B12, vit d  She continue to note issues, mild... May be decreased hearing on right as that is where husband talks.  Nml MMSE at [employer]. No clear indication for MRI brain.. No neuro changes, no ehadache.   She would like to try prevagen OTC.

## 2017-12-10 ENCOUNTER — Ambulatory Visit: Payer: PPO | Admitting: Cardiology

## 2017-12-10 ENCOUNTER — Encounter: Payer: Self-pay | Admitting: Cardiology

## 2017-12-10 VITALS — BP 140/64 | HR 60 | Ht 62.0 in | Wt 157.8 lb

## 2017-12-10 DIAGNOSIS — I10 Essential (primary) hypertension: Secondary | ICD-10-CM | POA: Diagnosis not present

## 2017-12-10 DIAGNOSIS — E0821 Diabetes mellitus due to underlying condition with diabetic nephropathy: Secondary | ICD-10-CM | POA: Diagnosis not present

## 2017-12-10 DIAGNOSIS — E7800 Pure hypercholesterolemia, unspecified: Secondary | ICD-10-CM

## 2017-12-10 DIAGNOSIS — I25110 Atherosclerotic heart disease of native coronary artery with unstable angina pectoris: Secondary | ICD-10-CM | POA: Diagnosis not present

## 2017-12-10 NOTE — Patient Instructions (Signed)
Medication Instructions:  Your physician recommends that you continue on your current medications as directed. Please refer to the Current Medication list given to you today.   Labwork: None  Testing/Procedures: None  Follow-Up: Your physician wants you to follow-up in: 6 months. You will receive a reminder letter in the mail two months in advance. If you don't receive a letter, please call our office to schedule the follow-up appointment.   If you need a refill on your cardiac medications before your next appointment, please call your pharmacy.   Thank you for choosing [facility]! Catherine Lockhart, RN [phone]    

## 2017-12-10 NOTE — Progress Notes (Deleted)
Cardiology Office Note:    Date:  12/10/2017   ID:  Janet Chapman, DOB May 11, 1936, MRN [id number]  PCP:  [name], MD  Cardiologist:  Jenean Lindau, MD   Referring MD: [name], MD    ASSESSMENT:    1. Coronary artery disease involving native coronary artery of native heart with unstable angina pectoris ([place])   2. Essential hypertension, benign   3. Diabetes mellitus due to underlying condition, controlled, with diabetic nephropathy, without long-term current use of insulin ([place])   4. High cholesterol    PLAN:    In order of problems listed above:  1. ***   Medication Adjustments/Labs and Tests Ordered: Current medicines are reviewed at length with the patient today.  Concerns regarding medicines are outlined above.  No orders of the defined types were placed in this encounter.  No orders of the defined types were placed in this encounter.    Chief Complaint  Patient presents with  . 1 month follow up     History of Present Illness:    Janet Chapman is a 82 y.o. female ***  Past Medical History:  Diagnosis Date  . Allergic rhinitis   . Diabetes mellitus without complication (HCC)    diet controlled  . GERD (gastroesophageal reflux disease)   . Hyperlipidemia   . Hypertension   . Osteoarthritis   . Urinary incontinence     Past Surgical History:  Procedure Laterality Date  . ABDOMINAL HYSTERECTOMY  1993   TA  . CARDIOVASCULAR STRESS TEST  2004   H. Smith, cardiolite-normal  . CHOLECYSTECTOMY  [phone]  . COLONOSCOPY    . LEFT HEART CATH AND CORONARY ANGIOGRAPHY N/A 11/06/2017   Procedure: LEFT HEART CATH AND CORONARY ANGIOGRAPHY;  Surgeon: Burnell Blanks, MD;  Location: [facility];  Service: Cardiovascular;  Laterality: N/A;    Current Medications: Current Meds  Medication Sig  . aspirin 81 MG tablet Take 81 mg by mouth at bedtime.   . Calcium Citrate-Vitamin D (CALCIUM + D PO) Take 1 tablet by mouth daily.  Marland Kitchen  CINNAMON PO Take 1 capsule by mouth daily.  . Coenzyme Q10 (COQ10) 100 MG CAPS Take 100 mg by mouth daily.  . Cyanocobalamin (B-12 PO) Take 1 capsule by mouth daily.  Marland Kitchen diltiazem (CARDIZEM CD) 120 MG 24 hr capsule TAKE 1 TABLET BY MOUTH DAILY  . KRILL OIL PO Take 1 capsule by mouth daily.  Marland Kitchen losartan-hydrochlorothiazide (HYZAAR) 100-25 MG tablet TAKE 1 TABLET BY MOUTH DAILY  . metoprolol succinate (TOPROL-XL) 50 MG 24 hr tablet TAKE 1 TABLET BY MOUTH DAILY  . Multiple Vitamins-Minerals (MULTIVITAMIN PO) Take 1 tablet by mouth daily.  . simvastatin (ZOCOR) 40 MG tablet TAKE 1 TABLET BY MOUTH EACH NIGHT AT BEDTIME  . traMADol (ULTRAM) 50 MG tablet      Allergies:   Codeine   Social History   Socioeconomic History  . Marital status: Married    Spouse name: Not on file  . Number of children: 0  . Years of education: Not on file  . Highest education level: Not on file  Occupational History  . Occupation: retired  Scientific laboratory technician  . Financial resource strain: Not on file  . Food insecurity:    Worry: Not on file    Inability: Not on file  . Transportation needs:    Medical: Not on file    Non-medical: Not on file  Tobacco Use  . Smoking status:  Never Smoker  . Smokeless tobacco: Never Used  Substance and Sexual Activity  . Alcohol use: Yes    Comment: 1 glass of wine per month  . Drug use: No  . Sexual activity: Never  Lifestyle  . Physical activity:    Days per week: Not on file    Minutes per session: Not on file  . Stress: Not on file  Relationships  . Social connections:    Talks on phone: Not on file    Gets together: Not on file    Attends religious service: Not on file    Active member of club or organization: Not on file    Attends meetings of clubs or organizations: Not on file    Relationship status: Not on file  Other Topics Concern  . Not on file  Social History Narrative    Regular exercise: yes, Curves 2x a week   Married 48 + years    Diet: fruit and  veggies, no FF, some water, artificial sweetener   HCPOA: [name], has living will, full code ( reviewed 2014)              Family History: The patient's family history includes Dementia in her mother; Diabetes in her unknown relative; Heart attack (age of onset: 70) in her father; Ovarian cancer in her unknown relative and unknown relative; Ovarian cancer (age of onset: 16) in her sister; Stroke (age of onset: 92) in her mother. There is no history of Colon cancer.  ROS:   Please see the history of present illness.    All other systems reviewed and are negative.  EKGs/Labs/Other Studies Reviewed:    The following studies were reviewed today: ***   Recent Labs: 06/07/2017: ALT 15 11/02/2017: BUN 12; Creatinine, Ser 0.70; Hemoglobin 14.4; Platelets 253; Potassium 3.6; Sodium 140 12/04/2017: TSH 1.94  Recent Lipid Panel    Component Value Date/Time   CHOL 176 06/07/2017 0912   TRIG 58.0 06/07/2017 0912   HDL 77.50 06/07/2017 0912   CHOLHDL 2 06/07/2017 0912   VLDL 11.6 06/07/2017 0912   LDLCALC 87 06/07/2017 0912   LDLDIRECT 119.8 04/26/2009 0846    Physical Exam:    VS:  BP 140/64   Pulse 60   Ht 5\' 2"  (1.575 m)   SpO2 97%   BMI 28.81 kg/m     Wt Readings from Last 3 Encounters:  12/06/17 157 lb 8 oz (71.4 kg)  12/04/17 156 lb 12 oz (71.1 kg)  11/19/17 156 lb 4 oz (70.9 kg)     GEN: Patient is in no acute distress HEENT: Normal NECK: No JVD; No carotid bruits LYMPHATICS: No lymphadenopathy CARDIAC: Hear sounds regular, 2/6 systolic murmur at the apex. RESPIRATORY:  Clear to auscultation without rales, wheezing or rhonchi  ABDOMEN: Soft, non-tender, non-distended MUSCULOSKELETAL:  No edema; No deformity  SKIN: Warm and dry NEUROLOGIC:  Alert and oriented x 3 PSYCHIATRIC:  Normal affect   Signed, Jenean Lindau, MD  12/10/2017 11:19 AM    Janet Chapman

## 2017-12-10 NOTE — Progress Notes (Signed)
Cardiology Office Note:    Date:  12/10/2017   ID:  [name], DOB 19-Aug-1935, MRN [id number]  PCP:  [name], MD  Cardiologist:  Jenean Lindau, MD   Referring MD: [name], MD    ASSESSMENT:    1. Coronary artery disease involving native coronary artery of native heart with unstable angina pectoris ([place])   2. Essential hypertension, benign   3. Diabetes mellitus due to underlying condition, controlled, with diabetic nephropathy, without long-term current use of insulin ([place])   4. High cholesterol    PLAN:    In order of problems listed above:  1. Secondary prevention stressed with the patient.  Importance of compliance with diet and medication stressed and she vocalized understanding. 2. Her blood pressure is stable.  Lipids are followed by her primary care physician. 3. She has a good walking program and asked her to continue it.  In view of this she is not at high risk for coronary events during the aforementioned surgery.  Meticulous hemodynamic monitoring and continued perioperative blockade will further reduce the risk of coronary events. 4. Patient will be seen in follow-up appointment in 6 months or earlier if the patient has any concerns  Medication Adjustments/Labs and Tests Ordered: Current medicines are reviewed at length with the patient today.  Concerns regarding medicines are outlined above.  No orders of the defined types were placed in this encounter.  No orders of the defined types were placed in this encounter.    Chief Complaint  Patient presents with  . 1 month follow up     History of Present Illness:    Janet Chapman is a 82 y.o. female.  Patient was evaluated for chest pain.  She has coronary artery disease.  Overall she has nonobstructive disease except in a small vessel.  She is walking on a regular basis without any symptoms.  No chest pain orthopnea or PND at this time.  She is planning to undergo shoulder surgery 2 weeks  from now.  At the time of my evaluation, the patient is alert awake oriented and in no distress.  Past Medical History:  Diagnosis Date  . Allergic rhinitis   . Diabetes mellitus without complication (HCC)    diet controlled  . GERD (gastroesophageal reflux disease)   . Hyperlipidemia   . Hypertension   . Osteoarthritis   . Urinary incontinence     Past Surgical History:  Procedure Laterality Date  . ABDOMINAL HYSTERECTOMY  1993   TA  . CARDIOVASCULAR STRESS TEST  2004   H. Smith, cardiolite-normal  . CHOLECYSTECTOMY  [phone]  . COLONOSCOPY    . LEFT HEART CATH AND CORONARY ANGIOGRAPHY N/A 11/06/2017   Procedure: LEFT HEART CATH AND CORONARY ANGIOGRAPHY;  Surgeon: Burnell Blanks, MD;  Location: [facility];  Service: Cardiovascular;  Laterality: N/A;    Current Medications: Current Meds  Medication Sig  . aspirin 81 MG tablet Take 81 mg by mouth at bedtime.   . Calcium Citrate-Vitamin D (CALCIUM + D PO) Take 1 tablet by mouth daily.  Marland Kitchen CINNAMON PO Take 1 capsule by mouth daily.  . Coenzyme Q10 (COQ10) 100 MG CAPS Take 100 mg by mouth daily.  . Cyanocobalamin (B-12 PO) Take 1 capsule by mouth daily.  Marland Kitchen diltiazem (CARDIZEM CD) 120 MG 24 hr capsule TAKE 1 TABLET BY MOUTH DAILY  . KRILL OIL PO Take 1 capsule by mouth daily.  Marland Kitchen losartan-hydrochlorothiazide (HYZAAR) 100-25 MG  tablet TAKE 1 TABLET BY MOUTH DAILY  . metoprolol succinate (TOPROL-XL) 50 MG 24 hr tablet TAKE 1 TABLET BY MOUTH DAILY  . Multiple Vitamins-Minerals (MULTIVITAMIN PO) Take 1 tablet by mouth daily.  . simvastatin (ZOCOR) 40 MG tablet TAKE 1 TABLET BY MOUTH EACH NIGHT AT BEDTIME  . traMADol (ULTRAM) 50 MG tablet      Allergies:   Codeine   Social History   Socioeconomic History  . Marital status: Married    Spouse name: Not on file  . Number of children: 0  . Years of education: Not on file  . Highest education level: Not on file  Occupational History  . Occupation: retired  Photographer  . Financial resource strain: Not on file  . Food insecurity:    Worry: Not on file    Inability: Not on file  . Transportation needs:    Medical: Not on file    Non-medical: Not on file  Tobacco Use  . Smoking status: Never Smoker  . Smokeless tobacco: Never Used  Substance and Sexual Activity  . Alcohol use: Yes    Comment: 1 glass of wine per month  . Drug use: No  . Sexual activity: Never  Lifestyle  . Physical activity:    Days per week: Not on file    Minutes per session: Not on file  . Stress: Not on file  Relationships  . Social connections:    Talks on phone: Not on file    Gets together: Not on file    Attends religious service: Not on file    Active member of club or organization: Not on file    Attends meetings of clubs or organizations: Not on file    Relationship status: Not on file  Other Topics Concern  . Not on file  Social History Narrative    Regular exercise: yes, Curves 2x a week   Married 48 + years    Diet: fruit and veggies, no FF, some water, artificial sweetener   HCPOA: [name], has living will, full code ( reviewed 2014)              Family History: The patient's family history includes Dementia in her mother; Diabetes in her unknown relative; Heart attack (age of onset: 33) in her father; Ovarian cancer in her unknown relative and unknown relative; Ovarian cancer (age of onset: 56) in her sister; Stroke (age of onset: 28) in her mother. There is no history of Colon cancer.  ROS:   Please see the history of present illness.    All other systems reviewed and are negative.  EKGs/Labs/Other Studies Reviewed:    The following studies were reviewed today: I discussed the findings of the coronary angiography with the patient at extensive length.   Recent Labs: 06/07/2017: ALT 15 11/02/2017: BUN 12; Creatinine, Ser 0.70; Hemoglobin 14.4; Platelets 253; Potassium 3.6; Sodium 140 12/04/2017: TSH 1.94  Recent Lipid Panel      Component Value Date/Time   CHOL 176 06/07/2017 0912   TRIG 58.0 06/07/2017 0912   HDL 77.50 06/07/2017 0912   CHOLHDL 2 06/07/2017 0912   VLDL 11.6 06/07/2017 0912   LDLCALC 87 06/07/2017 0912   LDLDIRECT 119.8 04/26/2009 0846    Physical Exam:    VS:  BP 140/64   Pulse 60   Ht 5\' 2"  (1.575 m)   SpO2 97%   BMI 28.81 kg/m     Wt Readings from Last 3 Encounters:  12/06/17 157 lb 8 oz (71.4 kg)  12/04/17 156 lb 12 oz (71.1 kg)  11/19/17 156 lb 4 oz (70.9 kg)     GEN: Patient is in no acute distress HEENT: Normal NECK: No JVD; No carotid bruits LYMPHATICS: No lymphadenopathy CARDIAC: Hear sounds regular, 2/6 systolic murmur at the apex. RESPIRATORY:  Clear to auscultation without rales, wheezing or rhonchi  ABDOMEN: Soft, non-tender, non-distended MUSCULOSKELETAL:  No edema; No deformity  SKIN: Warm and dry NEUROLOGIC:  Alert and oriented x 3 PSYCHIATRIC:  Normal affect   Signed, Jenean Lindau, MD  12/10/2017 11:23 AM    [place]

## 2017-12-12 ENCOUNTER — Telehealth: Payer: Self-pay | Admitting: *Deleted

## 2017-12-12 NOTE — Telephone Encounter (Signed)
Received fax from Mrs. Hogenson stating about 3 weeks ago she noticed a growth in her right nostril, the top of which was white. When she removed it, her nose would bleed.  The second time that she removed the new growth it took a lot of Kleenex and ice on and in her nose to stop the bleeding.  Another one has grown back but she did not touch it, and it is starting to dry up but is still "hanging on".  Monday she woke up with a hard spot on her tongue and the next day a white spot developed similar to the one in her nose and is still there today.  She is scheduled for shoulder surgery on 12/25/2017 and would like to get this cleared up before then if possible.  Spoke with Mrs. Pinela and scheduled her an appointment with Dr. Glori Bickers for 12/13/2017 at 9:00 am.

## 2017-12-13 ENCOUNTER — Ambulatory Visit (INDEPENDENT_AMBULATORY_CARE_PROVIDER_SITE_OTHER): Payer: PPO | Admitting: Family Medicine

## 2017-12-13 ENCOUNTER — Encounter: Payer: Self-pay | Admitting: Family Medicine

## 2017-12-13 VITALS — BP 156/84 | HR 56 | Temp 98.2°F | Ht 62.0 in | Wt 156.2 lb

## 2017-12-13 DIAGNOSIS — J3489 Other specified disorders of nose and nasal sinuses: Secondary | ICD-10-CM

## 2017-12-13 DIAGNOSIS — K1379 Other lesions of oral mucosa: Secondary | ICD-10-CM | POA: Diagnosis not present

## 2017-12-13 MED ORDER — MUPIROCIN 2 % EX OINT: 1.0000 "application " | TOPICAL_OINTMENT | Freq: Two times a day (BID) | CUTANEOUS | 0 refills | Status: DC

## 2017-12-13 NOTE — Progress Notes (Signed)
Subjective:    Patient ID: Janet Chapman, female    DOB: [birth year], 82 y.o.   MRN: [id number]  HPI 82 yo pt of Dr [name] with c/o spot in nose and tongue   She has h/o HTN and DM and CAD  She is getting ready for shoulder surgery in 2 wk   For 2 mo -has a spot in nose- a scab- occ bleeds and then heals  She has accidentally scraped it a few times  Trying to keep her hands off  R nostril    Monday- had a spot in mouth /with a white center -on tongue  It went away - but dark spot persisted that is a bit sensitive   No hx of cold sores or HSV  Patient Active Problem List   Diagnosis Date Noted  . Nostril sore 12/13/2017  . Mouth sore 12/13/2017  . Medicare annual wellness visit, subsequent 12/04/2017  . Coronary artery disease involving native coronary artery of native heart with unstable angina pectoris ([place])   . Bradycardia 06/12/2017  . Palpitations 06/12/2017  . Spider varicose veins 07/22/2015  . Advanced care planning/counseling discussion 07/08/2015  . 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 Chronic radicular low back pain 01/02/2014  . Atrophic vaginitis 09/26/2013  . Pulmonary nodule, right 05/23/2013  . Tinea pedis 12/20/2012  . INSOMNIA, CHRONIC 01/14/2010  . Diabetes mellitus, controlled ([place]) 11/11/2008  . MAMMOGRAM, ABNORMAL, LEFT 11/09/2008  . Chronic idiopathic constipation 09/11/2007  . OBESITY 05/31/2007  . Allergic rhinitis 05/31/2007  . GERD 05/31/2007  . OSTEOARTHRITIS 05/31/2007  . URINARY INCONTINENCE 05/31/2007  . High cholesterol 04/19/2007  . Essential hypertension, benign 04/19/2007  . ACTINIC KERATOSIS 02/01/2007   Past Medical History:  Diagnosis Date  . Allergic rhinitis   . Diabetes mellitus without complication (HCC)    diet controlled  . GERD (gastroesophageal reflux disease)   . Hyperlipidemia   . Hypertension   . Osteoarthritis   . Urinary incontinence    Past Surgical History:  Procedure  Laterality Date  . ABDOMINAL HYSTERECTOMY  1993   TA  . CARDIOVASCULAR STRESS TEST  2004   H. Smith, cardiolite-normal  . CHOLECYSTECTOMY  [phone]  . COLONOSCOPY    . LEFT HEART CATH AND CORONARY ANGIOGRAPHY N/A 11/06/2017   Procedure: LEFT HEART CATH AND CORONARY ANGIOGRAPHY;  Surgeon: Burnell Blanks, MD;  Location: [facility];  Service: Cardiovascular;  Laterality: N/A;   Social History   Tobacco Use  . Smoking status: Never Smoker  . Smokeless tobacco: Never Used  Substance Use Topics  . Alcohol use: Yes    Comment: 1 glass of wine per month  . Drug use: No   Family History  Problem Relation Age of Onset  . Heart attack Father 88  . Dementia Mother   . Stroke Mother 61  . Ovarian cancer Sister 43  . Diabetes Unknown        Maternal side  . Ovarian cancer Unknown        Grandmother  . Ovarian cancer Unknown        aunt  . Colon cancer Neg Hx    Allergies  Allergen Reactions  . Codeine Nausea Only   Current Outpatient Medications on File Prior to Visit  Medication Sig Dispense Refill  . acetaminophen (TYLENOL) 325 MG tablet Take 650 mg by mouth every 6 (six) hours as needed.    . diltiazem (CARDIZEM CD) 120 MG 24 hr capsule  TAKE 1 TABLET BY MOUTH DAILY 90 capsule 3  . Levocetirizine Dihydrochloride (XYZAL PO) Take 1 tablet by mouth daily.    Marland Kitchen losartan-hydrochlorothiazide (HYZAAR) 100-25 MG tablet TAKE 1 TABLET BY MOUTH DAILY 90 tablet 1  . metoprolol succinate (TOPROL-XL) 50 MG 24 hr tablet TAKE 1 TABLET BY MOUTH DAILY 90 tablet 3  . NON FORMULARY daily as needed. CBD OIL    . simvastatin (ZOCOR) 40 MG tablet TAKE 1 TABLET BY MOUTH EACH NIGHT AT BEDTIME 90 tablet 3  . aspirin 81 MG tablet Take 81 mg by mouth at bedtime.     . Calcium Citrate-Vitamin D (CALCIUM + D PO) Take 1 tablet by mouth daily.    Marland Kitchen CINNAMON PO Take 1 capsule by mouth daily.    . Coenzyme Q10 (COQ10) 100 MG CAPS Take 100 mg by mouth daily.    . Cyanocobalamin (B-12 PO) Take 1  capsule by mouth daily.    Marland Kitchen KRILL OIL PO Take 1 capsule by mouth daily.    . Multiple Vitamins-Minerals (MULTIVITAMIN PO) Take 1 tablet by mouth daily.     No current facility-administered medications on file prior to visit.     Review of Systems  Constitutional: Negative for activity change, appetite change, fatigue, fever and unexpected weight change.  HENT: Negative for congestion, ear pain, rhinorrhea, sinus pressure and sore throat.        Lesion in R nostril and on tongue   Eyes: Negative for pain, redness and visual disturbance.  Respiratory: Negative for cough, shortness of breath and wheezing.   Cardiovascular: Negative for chest pain and palpitations.  Gastrointestinal: Negative for abdominal pain, blood in stool, constipation and diarrhea.  Endocrine: Negative for polydipsia and polyuria.  Genitourinary: Negative for dysuria, frequency and urgency.  Musculoskeletal: Positive for arthralgias. Negative for back pain and myalgias.       Shoulder pain   Skin: Negative for pallor and rash.  Allergic/Immunologic: Negative for environmental allergies.  Neurological: Negative for dizziness, syncope and headaches.  Hematological: Negative for adenopathy. Does not bruise/bleed easily.  Psychiatric/Behavioral: Negative for decreased concentration and dysphoric mood. The patient is not nervous/anxious.        Objective:   Physical Exam  Constitutional: She is oriented to person, place, and time. She appears well-developed and well-nourished. No distress.  Well appearing   HENT:  Head: Normocephalic and atraumatic.  Right Ear: External ear normal.  Left Ear: External ear normal.  Mouth/Throat: Oropharynx is clear and moist.  Papule with scab in R lateral nostril  Smaller scab on septum across from it with dried blood  No swelling  Nl air flow  2   2-3 mm areas of hyperpigmentation on ant tongue w/o ulceration - non tender  Otherwise nl mouth exam  Eyes: Pupils are equal,  round, and reactive to light. Conjunctivae and EOM are normal. Right eye exhibits no discharge. Left eye exhibits no discharge. No scleral icterus.  Neck: Normal range of motion. Neck supple.  Cardiovascular: Normal rate, regular rhythm and normal heart sounds.  Pulmonary/Chest: Effort normal and breath sounds normal. No respiratory distress. She has no wheezes.  Musculoskeletal: She exhibits no edema.  Lymphadenopathy:    She has no cervical adenopathy.  Neurological: She is alert and oriented to person, place, and time. No cranial nerve deficit.  Skin: Skin is warm and dry. No rash noted. No erythema. No pallor.  Psychiatric: She has a normal mood and affect.  Assessment & Plan:   Problem List Items Addressed This Visit      Other   Mouth sore    Several spots on tongue -from hx sound like apth ulcers -now improved , hyperpigmented areas  May be caused by recent large acid /fruit intake Recommend salt water gargles  Improving-will obs  If worse-consider kenalog in orabase and further eval by ENT Update if not starting to improve in a week or if worsening        Nostril sore - Primary    2 areas in R nostril appear inflamed and denuded with scabs Px bactroban ointment for bid use  Adv not to pick or blow nose forcibly Update if not starting to improve in a week or if worsening

## 2017-12-13 NOTE — Assessment & Plan Note (Signed)
Several spots on tongue -from hx sound like apth ulcers -now improved , hyperpigmented areas  May be caused by recent large acid /fruit intake Recommend salt water gargles  Improving-will obs  If worse-consider kenalog in orabase and further eval by ENT Update if not starting to improve in a week or if worsening

## 2017-12-13 NOTE — Assessment & Plan Note (Signed)
2 areas in R nostril appear inflamed and denuded with scabs Px bactroban ointment for bid use  Adv not to pick or blow nose forcibly Update if not starting to improve in a week or if worsening

## 2017-12-13 NOTE — Patient Instructions (Addendum)
For mouth- cut back on fruit and sour things/beverages Acid can occasionally cause sores   Swish salt water in mouth several times per day   You have some scabs in your nose Do not scratch or pick them- let them heal  Use the bactroban ointment to use twice daily in right nostril  Be gentle and do not blow nose hard   Update if not starting to improve in a week or if worsening

## 2017-12-14 DIAGNOSIS — E119 Type 2 diabetes mellitus without complications: Secondary | ICD-10-CM | POA: Diagnosis not present

## 2017-12-14 DIAGNOSIS — H43813 Vitreous degeneration, bilateral: Secondary | ICD-10-CM | POA: Diagnosis not present

## 2017-12-14 DIAGNOSIS — H17822 Peripheral opacity of cornea, left eye: Secondary | ICD-10-CM | POA: Diagnosis not present

## 2017-12-14 DIAGNOSIS — H52223 Regular astigmatism, bilateral: Secondary | ICD-10-CM | POA: Diagnosis not present

## 2017-12-14 DIAGNOSIS — H5203 Hypermetropia, bilateral: Secondary | ICD-10-CM | POA: Diagnosis not present

## 2017-12-14 DIAGNOSIS — H2513 Age-related nuclear cataract, bilateral: Secondary | ICD-10-CM | POA: Diagnosis not present

## 2017-12-14 DIAGNOSIS — H35363 Drusen (degenerative) of macula, bilateral: Secondary | ICD-10-CM | POA: Diagnosis not present

## 2017-12-14 DIAGNOSIS — H524 Presbyopia: Secondary | ICD-10-CM | POA: Diagnosis not present

## 2017-12-14 LAB — HM DIABETES EYE EXAM

## 2017-12-18 ENCOUNTER — Encounter: Payer: Self-pay | Admitting: Family Medicine

## 2017-12-19 ENCOUNTER — Other Ambulatory Visit: Payer: Self-pay | Admitting: Family Medicine

## 2017-12-19 NOTE — Telephone Encounter (Signed)
Last office visit 12/13/2017 with Dr. Glori Bickers for nostril sore.  Tramadol not on current medication list.  Refill?

## 2017-12-20 NOTE — Telephone Encounter (Signed)
Patient returned call, I advised her of the note below by Dr. [name]. She says the medication was prescribed by Dr. Lorelei Pont for her shoulder and then she was referred to a surgeon, which surgery is Tuesday. She says she only takes it when the pain is real bad, but on the bottle it says 1 tab every 6 hours as needed. She says she is using an OTC medication CBD roll on and that helps the pain. She says if Dr. [name] doesn't want to order it, that is fine. I advised I will send this to Dr. [name] for review and recommendation.

## 2017-12-20 NOTE — Telephone Encounter (Addendum)
Left message for Mrs. Limburg to return call about Tramadol.   I need to know if she requested the refill.   What she is using this for and how often is she taking it.  Ok to [facility] Triage Nurse to speak with patient when she calls back.

## 2017-12-20 NOTE — Telephone Encounter (Signed)
Call pt what is she using this for? How often?

## 2017-12-21 NOTE — Telephone Encounter (Signed)
Janet Chapman notified that Dr. [name] did refill her Tramadol.

## 2017-12-25 DIAGNOSIS — M75111 Incomplete rotator cuff tear or rupture of right shoulder, not specified as traumatic: Secondary | ICD-10-CM | POA: Diagnosis not present

## 2017-12-25 DIAGNOSIS — M75121 Complete rotator cuff tear or rupture of right shoulder, not specified as traumatic: Secondary | ICD-10-CM | POA: Diagnosis not present

## 2017-12-25 DIAGNOSIS — M7521 Bicipital tendinitis, right shoulder: Secondary | ICD-10-CM | POA: Diagnosis not present

## 2017-12-25 DIAGNOSIS — M7541 Impingement syndrome of right shoulder: Secondary | ICD-10-CM | POA: Diagnosis not present

## 2017-12-25 DIAGNOSIS — G8918 Other acute postprocedural pain: Secondary | ICD-10-CM | POA: Diagnosis not present

## 2018-01-02 DIAGNOSIS — M75121 Complete rotator cuff tear or rupture of right shoulder, not specified as traumatic: Secondary | ICD-10-CM | POA: Diagnosis not present

## 2018-01-03 ENCOUNTER — Ambulatory Visit (INDEPENDENT_AMBULATORY_CARE_PROVIDER_SITE_OTHER): Payer: PPO | Admitting: Family Medicine

## 2018-01-03 ENCOUNTER — Encounter: Payer: Self-pay | Admitting: Family Medicine

## 2018-01-03 ENCOUNTER — Telehealth: Payer: Self-pay

## 2018-01-03 DIAGNOSIS — G8929 Other chronic pain: Secondary | ICD-10-CM

## 2018-01-03 DIAGNOSIS — R51 Headache: Secondary | ICD-10-CM | POA: Diagnosis not present

## 2018-01-03 NOTE — Progress Notes (Signed)
Subjective:    Patient ID: Janet Chapman, female    DOB: [birth year], 82 y.o.   MRN: [id number]  HPI  82 year old female with CAD, controlled DM and   HTN presents with new onset dizziness and headache.  She reports she has been having headache and mild dizziness off  And on ( unsteady on feet, not presyncopal.. More persistent. This week pain is every day. Tightness in head. 10/10.  Keeping her up at night.  Today she feels like head pain over entire head.  No numbness, no weakness. No numbness, no slurred speech.  No confusion, but maybe slightly more memory issues per husband.   No falls, no injuries.  No hx of migraines.  She has been treating nostril sore with mupirocin ointment.. Sore has resolved.   She has  12/25/2017 shoulder arthroscopy.   She has been using hydrocodone for pain.She has been using tramadol now for pain.   Social History /Family History/Past Medical History reviewed in detail and updated in EMR if needed. Blood pressure 130/64, pulse (!) 53, temperature 97.8 F (36.6 C), temperature source Oral, height 5\' 2"  (1.575 m), weight 159 lb (72.1 kg).  Review of Systems  Constitutional: Negative for fatigue and fever.  HENT: Negative for congestion.   Eyes: Negative for pain.  Respiratory: Negative for cough and shortness of breath.   Cardiovascular: Negative for chest pain, palpitations and leg swelling.  Gastrointestinal: Negative for abdominal pain.  Genitourinary: Negative for dysuria and vaginal bleeding.  Musculoskeletal: Negative for back pain.  Neurological: Positive for headaches. Negative for syncope and light-headedness.  Psychiatric/Behavioral: Negative for dysphoric mood.       Objective:   Physical Exam  Constitutional: She is oriented to person, place, and time. Vital signs are normal. She appears well-developed and well-nourished. She is cooperative.  Non-toxic appearance. She does not appear ill. No distress.  HENT:  Head:  Normocephalic.  Right Ear: Hearing, tympanic membrane, external ear and ear canal normal. Tympanic membrane is not erythematous, not retracted and not bulging.  Left Ear: Hearing, tympanic membrane, external ear and ear canal normal. Tympanic membrane is not erythematous, not retracted and not bulging.  Nose: No mucosal edema or rhinorrhea. Right sinus exhibits no maxillary sinus tenderness and no frontal sinus tenderness. Left sinus exhibits no maxillary sinus tenderness and no frontal sinus tenderness.  Mouth/Throat: Uvula is midline, oropharynx is clear and moist and mucous membranes are normal.  Eyes: Pupils are equal, round, and reactive to light. Conjunctivae, EOM and lids are normal. Lids are everted and swept, no foreign bodies found.  Neck: Trachea normal and normal range of motion. Neck supple. Carotid bruit is not present. No thyroid mass and no thyromegaly present.  Cardiovascular: Normal rate, regular rhythm, S1 normal, S2 normal, normal heart sounds, intact distal pulses and normal pulses. Exam reveals no gallop and no friction rub.  No murmur heard. Pulmonary/Chest: Effort normal and breath sounds normal. No tachypnea. No respiratory distress. She has no decreased breath sounds. She has no wheezes. She has no rhonchi. She has no rales.  Abdominal: Soft. Normal appearance and bowel sounds are normal. There is no tenderness.  Neurological: She is alert and oriented to person, place, and time. She has normal strength and normal reflexes. No cranial nerve deficit or sensory deficit. She exhibits normal muscle tone. She displays a negative Romberg sign. Coordination and gait normal. GCS eye subscore is 4. GCS verbal subscore is 5. GCS motor subscore is 6.  Nml cerebellar exam   No papilledema  Skin: Skin is warm, dry and intact. No rash noted.  Psychiatric: She has a normal mood and affect. Her speech is normal and behavior is normal. Judgment and thought content normal. Her mood appears  not anxious. Cognition and memory are normal. Cognition and memory are not impaired. She does not exhibit a depressed mood. She exhibits normal recent memory and normal remote memory.          Assessment & Plan:

## 2018-01-03 NOTE — Telephone Encounter (Signed)
I will assess her at the [facility]

## 2018-01-03 NOTE — Patient Instructions (Addendum)
Our referral coordinator will call in AM to set up for testing.

## 2018-01-03 NOTE — Telephone Encounter (Signed)
Pt faxed a request for an appt today for H/A and dizziness; the h/a is almost constant and pt has been taking tramadol and tylenol for surgery on pts shoulder but it has not helped the h/a. Pt said the entire head hurts and the pain goes down the back of her neck. No head congestion or N&V. Pt has appt with Dr Silvio Pate 01/03/18 at 4:30. Pt voiced understanding.

## 2018-01-03 NOTE — Assessment & Plan Note (Signed)
Headache associated with dizziness.. New for pt in last month and progressively worse, now daily.  No known fall or head injury.  Pt states very severe and cannot sleep at night with pain.   Some amount of concern for subacute bleed, but pt neuro exam normal.  Will eval with  Head CT to rule out bleed/mass.  If negative... ? If tension or sinus associated headache. Trial of flonase.  ? Med SE headache or due to stress with recent health issues.

## 2018-01-04 ENCOUNTER — Ambulatory Visit (INDEPENDENT_AMBULATORY_CARE_PROVIDER_SITE_OTHER)
Admission: RE | Admit: 2018-01-04 | Discharge: 2018-01-04 | Disposition: A | Discharge status: Home / Self Care | Payer: PPO | Source: Ambulatory Visit | Attending: Family Medicine | Admitting: Family Medicine

## 2018-01-04 DIAGNOSIS — G8929 Other chronic pain: Secondary | ICD-10-CM

## 2018-01-04 DIAGNOSIS — R51 Headache: Secondary | ICD-10-CM | POA: Diagnosis not present

## 2018-01-09 DIAGNOSIS — M75121 Complete rotator cuff tear or rupture of right shoulder, not specified as traumatic: Secondary | ICD-10-CM | POA: Diagnosis not present

## 2018-01-16 DIAGNOSIS — M75121 Complete rotator cuff tear or rupture of right shoulder, not specified as traumatic: Secondary | ICD-10-CM | POA: Diagnosis not present

## 2018-01-23 DIAGNOSIS — M75121 Complete rotator cuff tear or rupture of right shoulder, not specified as traumatic: Secondary | ICD-10-CM | POA: Diagnosis not present

## 2018-01-29 DIAGNOSIS — M75121 Complete rotator cuff tear or rupture of right shoulder, not specified as traumatic: Secondary | ICD-10-CM | POA: Diagnosis not present

## 2018-02-06 DIAGNOSIS — Z9889 Other specified postprocedural states: Secondary | ICD-10-CM | POA: Diagnosis not present

## 2018-02-06 DIAGNOSIS — M75121 Complete rotator cuff tear or rupture of right shoulder, not specified as traumatic: Secondary | ICD-10-CM | POA: Diagnosis not present

## 2018-02-09 ENCOUNTER — Other Ambulatory Visit: Payer: Self-pay | Admitting: Family Medicine

## 2018-02-11 DIAGNOSIS — M75121 Complete rotator cuff tear or rupture of right shoulder, not specified as traumatic: Secondary | ICD-10-CM | POA: Diagnosis not present

## 2018-02-14 DIAGNOSIS — M75121 Complete rotator cuff tear or rupture of right shoulder, not specified as traumatic: Secondary | ICD-10-CM | POA: Diagnosis not present

## 2018-02-18 DIAGNOSIS — M75121 Complete rotator cuff tear or rupture of right shoulder, not specified as traumatic: Secondary | ICD-10-CM | POA: Diagnosis not present

## 2018-02-21 DIAGNOSIS — M75121 Complete rotator cuff tear or rupture of right shoulder, not specified as traumatic: Secondary | ICD-10-CM | POA: Diagnosis not present

## 2018-02-25 DIAGNOSIS — M75121 Complete rotator cuff tear or rupture of right shoulder, not specified as traumatic: Secondary | ICD-10-CM | POA: Diagnosis not present

## 2018-02-28 DIAGNOSIS — M75121 Complete rotator cuff tear or rupture of right shoulder, not specified as traumatic: Secondary | ICD-10-CM | POA: Diagnosis not present

## 2018-05-07 ENCOUNTER — Other Ambulatory Visit: Payer: Self-pay | Admitting: Family Medicine

## 2018-05-09 ENCOUNTER — Other Ambulatory Visit: Payer: Self-pay | Admitting: Family Medicine

## 2018-05-23 DIAGNOSIS — M5136 Other intervertebral disc degeneration, lumbar region: Secondary | ICD-10-CM | POA: Diagnosis not present

## 2018-05-23 DIAGNOSIS — M9905 Segmental and somatic dysfunction of pelvic region: Secondary | ICD-10-CM | POA: Diagnosis not present

## 2018-05-23 DIAGNOSIS — M9903 Segmental and somatic dysfunction of lumbar region: Secondary | ICD-10-CM | POA: Diagnosis not present

## 2018-05-23 DIAGNOSIS — M9904 Segmental and somatic dysfunction of sacral region: Secondary | ICD-10-CM | POA: Diagnosis not present

## 2018-05-30 DIAGNOSIS — M5136 Other intervertebral disc degeneration, lumbar region: Secondary | ICD-10-CM | POA: Diagnosis not present

## 2018-05-30 DIAGNOSIS — M9904 Segmental and somatic dysfunction of sacral region: Secondary | ICD-10-CM | POA: Diagnosis not present

## 2018-05-30 DIAGNOSIS — M9905 Segmental and somatic dysfunction of pelvic region: Secondary | ICD-10-CM | POA: Diagnosis not present

## 2018-05-30 DIAGNOSIS — M9903 Segmental and somatic dysfunction of lumbar region: Secondary | ICD-10-CM | POA: Diagnosis not present

## 2018-06-06 DIAGNOSIS — M9905 Segmental and somatic dysfunction of pelvic region: Secondary | ICD-10-CM | POA: Diagnosis not present

## 2018-06-06 DIAGNOSIS — M9904 Segmental and somatic dysfunction of sacral region: Secondary | ICD-10-CM | POA: Diagnosis not present

## 2018-06-06 DIAGNOSIS — M9903 Segmental and somatic dysfunction of lumbar region: Secondary | ICD-10-CM | POA: Diagnosis not present

## 2018-06-06 DIAGNOSIS — M5136 Other intervertebral disc degeneration, lumbar region: Secondary | ICD-10-CM | POA: Diagnosis not present

## 2018-07-01 ENCOUNTER — Ambulatory Visit (INDEPENDENT_AMBULATORY_CARE_PROVIDER_SITE_OTHER): Payer: PPO | Admitting: Internal Medicine

## 2018-07-01 ENCOUNTER — Encounter: Payer: Self-pay | Admitting: Internal Medicine

## 2018-07-01 VITALS — BP 124/70 | HR 50 | Temp 98.2°F | Ht 62.0 in | Wt 158.2 lb

## 2018-07-01 DIAGNOSIS — J3489 Other specified disorders of nose and nasal sinuses: Secondary | ICD-10-CM | POA: Diagnosis not present

## 2018-07-01 NOTE — Assessment & Plan Note (Signed)
Hard to tell if infectious or allergic---not really sick Will increase her antihistamine regimen If not improving later this week, will try empiric amoxil

## 2018-07-01 NOTE — Progress Notes (Signed)
Subjective:    Patient ID: Janet Chapman, female    DOB: 10-15-1935, 83 y.o.   MRN: [id number]  HPI Here due to sinus symptoms  Started with throat issues---post nasal drip and mucus build up About a week ago Chloraseptic helped a little Not improving--some worse Head is off--dizzy at times  Not much cough--just to clear mucus No headache No fever Doesn't really feel sick No ear pain  Also tried mucinex DM Does take her xyzal most days---tried cutting the dose  Current Outpatient Medications on File Prior to Visit  Medication Sig Dispense Refill  . aspirin 81 MG tablet Take 81 mg by mouth at bedtime.     . Calcium Citrate-Vitamin D (CALCIUM + D PO) Take 1 tablet by mouth daily.    . Coenzyme Q10 (COQ10) 100 MG CAPS Take 100 mg by mouth daily.    . Cyanocobalamin (B-12 PO) Take 1 capsule by mouth daily.    Marland Kitchen diltiazem (CARDIZEM CD) 120 MG 24 hr capsule TAKE 1 TABLET BY MOUTH DAILY 90 capsule 3  . Levocetirizine Dihydrochloride (XYZAL PO) Take 1 tablet by mouth daily.    Marland Kitchen losartan-hydrochlorothiazide (HYZAAR) 100-25 MG tablet TAKE 1 TABLET BY MOUTH DAILY 90 tablet 2  . metoprolol succinate (TOPROL-XL) 50 MG 24 hr tablet TAKE 1 TABLET BY MOUTH DAILY 90 tablet 3  . Multiple Vitamins-Minerals (MULTIVITAMIN PO) Take 1 tablet by mouth daily.    . NON FORMULARY daily as needed. CBD OIL    . simvastatin (ZOCOR) 40 MG tablet TAKE 1 TABLET BY MOUTH EACH NIGHT AT BEDTIME 90 tablet 3  . traMADol (ULTRAM) 50 MG tablet TAKE 1 TABLET BY MOUTH EVERY 6 HOURS AS NEEDED FOR UP TO 5 DAYS 20 tablet 0   No current facility-administered medications on file prior to visit.     Allergies  Allergen Reactions  . Codeine Nausea Only    Past Medical History:  Diagnosis Date  . Allergic rhinitis   . Diabetes mellitus without complication (HCC)    diet controlled  . GERD (gastroesophageal reflux disease)   . Hyperlipidemia   . Hypertension   . Osteoarthritis   . Urinary incontinence      Past Surgical History:  Procedure Laterality Date  . ABDOMINAL HYSTERECTOMY  1993   TA  . CARDIOVASCULAR STRESS TEST  2004   [facility], cardiolite-normal  . CHOLECYSTECTOMY  [phone]  . COLONOSCOPY    . LEFT HEART CATH AND CORONARY ANGIOGRAPHY N/A 11/06/2017   Procedure: LEFT HEART CATH AND CORONARY ANGIOGRAPHY;  Surgeon: Burnell Blanks, MD;  Location: [facility];  Service: Cardiovascular;  Laterality: N/A;    Family History  Problem Relation Age of Onset  . Heart attack Father 18  . Dementia Mother   . Stroke Mother 66  . Ovarian cancer Sister 38  . Diabetes Unknown        Maternal side  . Ovarian cancer Unknown        Grandmother  . Ovarian cancer Unknown        aunt  . Colon cancer Neg Hx     Social History   Socioeconomic History  . Marital status: Married    Spouse name: Not on file  . Number of children: 0  . Years of education: Not on file  . Highest education level: Not on file  Occupational History  . Occupation: retired  Scientific laboratory technician  . Financial resource strain: Not on file  . Food insecurity:  Worry: Not on file    Inability: Not on file  . Transportation needs:    Medical: Not on file    Non-medical: Not on file  Tobacco Use  . Smoking status: Never Smoker  . Smokeless tobacco: Never Used  Substance and Sexual Activity  . Alcohol use: Yes    Comment: 1 glass of wine per month  . Drug use: No  . Sexual activity: Never  Lifestyle  . Physical activity:    Days per week: Not on file    Minutes per session: Not on file  . Stress: Not on file  Relationships  . Social connections:    Talks on phone: Not on file    Gets together: Not on file    Attends religious service: Not on file    Active member of club or organization: Not on file    Attends meetings of clubs or organizations: Not on file    Relationship status: Not on file  . Intimate partner violence:    Fear of current or ex partner: Not on file    Emotionally  abused: Not on file    Physically abused: Not on file    Forced sexual activity: Not on file  Other Topics Concern  . Not on file  Social History Narrative    Regular exercise: yes, Curves 2x a week   Married 48 + years    Diet: fruit and veggies, no FF, some water, artificial sweetener   HCPOA: [name], has living will, full code ( reviewed 2014)            Review of Systems No new furniture or pet No vomiting or diarrhea Eating okay    Objective:   Physical Exam  Constitutional: She appears well-developed. No distress.  HENT:  No sinus tenderness Moderate nasal swelling--more pale than inflamed. Some mucus on left TMs normal Pharynx normal  Neck: No thyromegaly present.  Respiratory: Effort normal and breath sounds normal. No respiratory distress. She has no wheezes. She has no rales.  Lymphadenopathy:    She has no cervical adenopathy.           Assessment & Plan:

## 2018-07-01 NOTE — Patient Instructions (Signed)
Please take the full xyzal daily and add loratadine 10mg  (1-2 daily). If you are not improving later in the week, I will send a prescription for an antibiotic (amoxicillin)

## 2018-07-02 ENCOUNTER — Telehealth: Payer: Self-pay | Admitting: Family Medicine

## 2018-07-02 NOTE — Telephone Encounter (Signed)
I think she should probably give it another couple of days. If no better by the end of the week, I will send the antibiotic

## 2018-07-02 NOTE — Telephone Encounter (Signed)
Pt called in regards to being seen yesterday by Dr.Letvak. He recommended some otc medications and told the pt if those don't work then to call him so he can send in a antibiotic. Pt uses [facility]

## 2018-07-02 NOTE — Telephone Encounter (Signed)
Spoke to pt. She will call back on Friday if there is no improvement in symptoms.

## 2018-07-08 ENCOUNTER — Telehealth: Payer: Self-pay

## 2018-07-08 NOTE — Telephone Encounter (Signed)
Received a fax from the pt 07-05-18. Left detailed message on VM per DPR with response.  See Media for fax.

## 2018-07-16 ENCOUNTER — Other Ambulatory Visit: Payer: Self-pay

## 2018-07-16 ENCOUNTER — Telehealth: Payer: Self-pay

## 2018-07-16 ENCOUNTER — Ambulatory Visit: Payer: PPO | Admitting: Family Medicine

## 2018-07-16 NOTE — Telephone Encounter (Signed)
Sounds good, I agree, sounds more like allergies or other! Thanks.

## 2018-07-16 NOTE — Telephone Encounter (Signed)
Pt faxed note requesting abx or appt for congestion and drainage at back of throat. No coughing; ? Fever, pt feels warm on and off, has not taken temp. Pt was seen 07/01/18 by Dr Silvio Pate and in the office note Dr Silvio Pate noted if pt not better later in the wk to call and he would send in rx for amoxicillin. Pt request rx to go to [facility]. Please see faxed letter in Dr Rometta Emery in box.

## 2018-07-16 NOTE — Telephone Encounter (Signed)
This is 2 weeks later.. is this the same illness... if so call in amox 500 mg 2 capsules BID x 10 days. If got better and sick again.. recommend symptomatic care and appt if not improving, unless fever and SOB.

## 2018-07-19 ENCOUNTER — Encounter: Payer: Self-pay | Admitting: Family Medicine

## 2018-07-19 ENCOUNTER — Ambulatory Visit (INDEPENDENT_AMBULATORY_CARE_PROVIDER_SITE_OTHER): Payer: PPO | Admitting: Family Medicine

## 2018-07-19 DIAGNOSIS — J309 Allergic rhinitis, unspecified: Secondary | ICD-10-CM | POA: Diagnosis not present

## 2018-07-19 DIAGNOSIS — J3489 Other specified disorders of nose and nasal sinuses: Secondary | ICD-10-CM | POA: Diagnosis not present

## 2018-07-19 MED ORDER — MONTELUKAST SODIUM 10 MG PO TABS: 10.0000 mg | ORAL_TABLET | Freq: Every day | ORAL | 3 refills | Status: DC

## 2018-07-19 NOTE — Assessment & Plan Note (Addendum)
Continue allegra, stop Xyzal and loratidine.  Start singulair and flonase.  If not improving consider allergy referral.

## 2018-07-19 NOTE — Patient Instructions (Addendum)
Stop Xyzal  and loratadine.  Start Singulair at night.  Start Flonase 2 sprays per nostril.  Call if not improviing as expected in 2-3 weeks.

## 2018-07-19 NOTE — Progress Notes (Signed)
Subjective:    Patient ID: Janet Chapman, female    DOB: [birth year], 83 y.o.   MRN: [id number]  HPI   83 year old female presetns for follow up sinus infection.  She was seen by Dr.Letvak on 07/01/2018 Treated for allergies.. with Xyzal and loratadine.   Then added allegra.. felt better  Until 1.5 week ago   She had improvement with nighttime symptoms. Today she reports continued  Symptoms.. runny nose, nasal congestion, post nasal, hoarse voice. Throat irritate  Off and on as well.  No fever, no SOB, no cough, no wheeze  Social History /Family History/Past Medical History reviewed in detail and updated in EMR if needed. Blood pressure 130/80, pulse (!) 54, temperature (!) 97.5 F (36.4 C), temperature source Oral, height 5\' 2"  (1.575 m), weight 157 lb 8 oz (71.4 kg), SpO2 97 %.  Review of Systems  Constitutional: Negative for fatigue and fever.  HENT: Negative for congestion.   Eyes: Negative for pain.  Respiratory: Negative for cough and shortness of breath.   Cardiovascular: Negative for chest pain, palpitations and leg swelling.  Gastrointestinal: Negative for abdominal pain.  Genitourinary: Negative for dysuria and vaginal bleeding.  Musculoskeletal: Negative for back pain.  Neurological: Negative for syncope, light-headedness and headaches.  Psychiatric/Behavioral: Negative for dysphoric mood.       Objective:   Physical Exam Constitutional:      General: She is not in acute distress.    Appearance: Normal appearance. She is well-developed. She is not ill-appearing or toxic-appearing.  HENT:     Head: Normocephalic.     Right Ear: Hearing, tympanic membrane, ear canal and external ear normal. Tympanic membrane is not erythematous, retracted or bulging.     Left Ear: Hearing, tympanic membrane, ear canal and external ear normal. Tympanic membrane is not erythematous, retracted or bulging.     Nose: No mucosal edema or rhinorrhea.     Right Turbinates: Pale.   Left Turbinates: Pale.     Right Sinus: No maxillary sinus tenderness or frontal sinus tenderness.     Left Sinus: No maxillary sinus tenderness or frontal sinus tenderness.     Comments: Post nasal drip    Mouth/Throat:     Pharynx: Uvula midline.  Eyes:     General: Lids are normal. Lids are everted, no foreign bodies appreciated.     Conjunctiva/sclera: Conjunctivae normal.     Pupils: Pupils are equal, round, and reactive to light.  Neck:     Musculoskeletal: Normal range of motion and neck supple.     Thyroid: No thyroid mass or thyromegaly.     Vascular: No carotid bruit.     Trachea: Trachea normal.  Cardiovascular:     Rate and Rhythm: Normal rate and regular rhythm.     Pulses: Normal pulses.     Heart sounds: Normal heart sounds, S1 normal and S2 normal. No murmur. No friction rub. No gallop.   Pulmonary:     Effort: Pulmonary effort is normal. No tachypnea or respiratory distress.     Breath sounds: Normal breath sounds. No decreased breath sounds, wheezing, rhonchi or rales.  Abdominal:     General: Bowel sounds are normal.     Palpations: Abdomen is soft.     Tenderness: There is no abdominal tenderness.  Skin:    General: Skin is warm and dry.     Findings: No rash.  Neurological:     Mental Status: She is alert.  Psychiatric:  Mood and Affect: Mood is not anxious or depressed.        Speech: Speech normal.        Behavior: Behavior normal. Behavior is cooperative.        Thought Content: Thought content normal.        Judgment: Judgment normal.           Assessment & Plan:

## 2018-07-19 NOTE — Assessment & Plan Note (Signed)
No sign of infection. Likely due to allergies. No antibiotics indicated.

## 2018-08-07 ENCOUNTER — Other Ambulatory Visit: Payer: Self-pay | Admitting: Family Medicine

## 2018-08-21 ENCOUNTER — Telehealth: Payer: Self-pay | Admitting: *Deleted

## 2018-08-21 NOTE — Telephone Encounter (Signed)
Received fax from Mrs. Osuch on 08/20/2018 that states.about three weeks ago I began to have heart palpitations which were very strong.  About this same time, [facility] informed me that they no longer could get Losartan-HCTZ in one pill (for the time being)  So I have to take them separately.  I did not think to ask questions, however, soon after taking the two pills, I began to suffer heart palpitations that were very strong and frightening.   Since I have saved 3 of the "old" prescription, I took those and seemed to get better.  However, when I began the new pills again, the palpitations returned. I have been a heavy coffee drinker in the morning-regular coffee.  For the last couple of days, I have been drinking decaf and drinking less coffee.  The palpitations have continued until last night I could not sleep at all. Combined with this I have sinus drainage.  I stopped the allergy medication that I had been taking since that did not seem to be a major problem now. This morning I checked with WebMD and read about the causes of palpitations.  I guess I drop coffee regardless of the amount of caffeine in the decaf. As I type this letter, my heart lets me know that it is there, but it is a steady beat instead of the combination that I have had. Do you have any suggestions to reduce and/or stop these palpitations: visit your office, change my meds, etc. Hope all is well there.  THANKS.  Per Dr. [name] patient probably needs in office visit but will start with Web[employer] or [employer] Me early Thursday. (Dr. [name] will be working from home).   If she can't handle online will have her come in for EKG with another provider.  If chest pain or SOB needs appointment on 08/21/2018 for go to the ER.   Mrs. Maish notified as instructed by telephone.  She states she has been on the Internet and they recommended exercise so yesterday she went out and walked and thinks things are better.  She was able to sleep last  night.  She has stopped her allergy medications.  She declined office visit/virutal appointment at this time.  She wants to continue with the walking and see how things goes.  She will call back to schedule appointment if she feels things are not continuing to improve.  I did advise we were closed on Friday so I would recommend she call back on Thursday if palpitations continue and if she has CP or SOB to go to ER.  Patient states understanding.

## 2018-09-04 ENCOUNTER — Ambulatory Visit
Admission: EM | Admit: 2018-09-04 | Discharge: 2018-09-04 | Disposition: A | Discharge status: Home / Self Care | Payer: PPO | Attending: Emergency Medicine | Admitting: Emergency Medicine

## 2018-09-04 ENCOUNTER — Telehealth: Payer: Self-pay

## 2018-09-04 ENCOUNTER — Encounter: Payer: Self-pay | Admitting: Emergency Medicine

## 2018-09-04 ENCOUNTER — Ambulatory Visit (INDEPENDENT_AMBULATORY_CARE_PROVIDER_SITE_OTHER): Payer: PPO

## 2018-09-04 ENCOUNTER — Other Ambulatory Visit: Payer: Self-pay

## 2018-09-04 DIAGNOSIS — R0602 Shortness of breath: Secondary | ICD-10-CM

## 2018-09-04 DIAGNOSIS — R001 Bradycardia, unspecified: Secondary | ICD-10-CM | POA: Diagnosis not present

## 2018-09-04 DIAGNOSIS — Z7689 Persons encountering health services in other specified circumstances: Secondary | ICD-10-CM | POA: Diagnosis not present

## 2018-09-04 NOTE — ED Notes (Signed)
Patient able to ambulate independently.  Patient gave this RN permission to update her husband on her visit and POC.  Updated and verbalized understanding.

## 2018-09-04 NOTE — Discharge Instructions (Signed)
Your EKG and chest xray are reassuring today.  Your oxygen level is normal as is your respiratory rate.  Your heart rate is lower than normal rates but this appears to be normal for you.  If any worsening of symptoms, dizziness, shortness of breath , chest pain , palpitations, nausea, sweating, weakness, please go to the ER.  Please follow up with your primary care provider and/or your cardiologist for recheck for persistent symptoms.

## 2018-09-04 NOTE — ED Notes (Signed)
Pt states she also takes "preva-something" and a medication for her memory.  Patient and husband unable to recall medications at this time.

## 2018-09-04 NOTE — ED Triage Notes (Signed)
Pt presents to [facility] for assessment of difficulty getting a deep breath, started 2 days ago.  States she has had all of her symptoms in the past, but feels like they are more intense then normal.  Pt states she is having a "tired" sensation between her shoulder blades.  Denies chest pain, denies n/v/d, denies pain anywhere, denies dizziness.  Denies worsening of SOB with exertion.  Denies any leg swelling.

## 2018-09-04 NOTE — ED Provider Notes (Signed)
[facility]    CSN: [id number] Arrival date & time: 09/04/18  1454     History   Chief Complaint Chief Complaint  Patient presents with  . Shortness of Breath    HPI Janet Chapman is a 83 y.o. female.   Janet Chapman presents with complaints of shortness of breath , sensation of "breathing hard." She states she feels it started approximately 2-3 days ago. Hasn't worsened but hasn't improved. She feels it is worse at night, but deep breathing helps and she is able to sleep. She feels palpitations at night, this started 2-3 weeks ago, denies currently. She feels this has improved. No chest pain. Minimal cough. Has had some nasal drainage. States she was treated for allergies but stopped taking prescribed medications as symptoms had improved. Now has had some post nasal drip over the past few days, worse at night. No fevers. No leg pain or swelling. No recent travel. She has still been walking for some exercise, yesterday walked a small amount less than normal for her as she felt more fatigue. She feels her throat is dry. She does not smoke. No nausea or vomiting. Intermittent constipation, no blood in stools. She started a new medication for "memory" approximately 6 days ago but she and her husband do not know the name of the medication. No known ill contacts. Her husband has noted some throat clearing over night but otherwise has not noted any wheezing or distress. Patient is followed with cardiology, had a heart cath 10/2017 with non-obstructive cardiac disease. Hx of bradycardia per chart review, takes cardizem and metoprolol. Stress test last in 2014. Hasn't taken any medications for symptoms.     ROS per HPI, negative if not otherwise mentioned.      Past Medical History:  Diagnosis Date  . Allergic rhinitis   . Diabetes mellitus without complication (HCC)    diet controlled  . GERD (gastroesophageal reflux disease)   . Hyperlipidemia   . Hypertension   .  Osteoarthritis   . Urinary incontinence     Patient Active Problem List   Diagnosis Date Noted  . Sinus drainage 07/01/2018  . Chronic intractable headache 01/03/2018  . Nostril sore 12/13/2017  . Mouth sore 12/13/2017  . Medicare annual wellness visit, subsequent 12/04/2017  . Coronary artery disease involving native coronary artery of native heart with unstable angina pectoris ([place])   . Bradycardia 06/12/2017  . Palpitations 06/12/2017  . Spider varicose veins 07/22/2015  . Advanced care planning/counseling discussion 07/08/2015  . 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 Chronic radicular low back pain 01/02/2014  . Atrophic vaginitis 09/26/2013  . Pulmonary nodule, right 05/23/2013  . Tinea pedis 12/20/2012  . INSOMNIA, CHRONIC 01/14/2010  . Diabetes mellitus, controlled ([place]) 11/11/2008  . MAMMOGRAM, ABNORMAL, LEFT 11/09/2008  . Chronic idiopathic constipation 09/11/2007  . OBESITY 05/31/2007  . Allergic rhinitis 05/31/2007  . GERD 05/31/2007  . OSTEOARTHRITIS 05/31/2007  . URINARY INCONTINENCE 05/31/2007  . High cholesterol 04/19/2007  . Essential hypertension, benign 04/19/2007  . ACTINIC KERATOSIS 02/01/2007    Past Surgical History:  Procedure Laterality Date  . ABDOMINAL HYSTERECTOMY  1993   TA  . CARDIOVASCULAR STRESS TEST  2004   [facility], cardiolite-normal  . CHOLECYSTECTOMY  [phone]  . COLONOSCOPY    . LEFT HEART CATH AND CORONARY ANGIOGRAPHY N/A 11/06/2017   Procedure: LEFT HEART CATH AND CORONARY ANGIOGRAPHY;  Surgeon: Burnell Blanks, MD;  Location: [facility];  Service: Cardiovascular;  Laterality: N/A;    OB History   No obstetric history on file.      Home Medications    Prior to Admission medications   Medication Sig Start Date End Date Taking? Authorizing Provider  aspirin 81 MG tablet Take 81 mg by mouth at bedtime.    Yes [provider]  Coenzyme Q10 (COQ10) 100 MG CAPS Take 100 mg by  mouth daily.   Yes [provider]  diltiazem (CARDIZEM CD) 120 MG 24 hr capsule TAKE 1 TABLET BY MOUTH DAILY 11/16/17  Yes Bedsole, Amy E, MD  losartan-hydrochlorothiazide (HYZAAR) 100-25 MG tablet TAKE 1 TABLET BY MOUTH DAILY 05/07/18  Yes Bedsole, Amy E, MD  metoprolol succinate (TOPROL-XL) 50 MG 24 hr tablet TAKE 1 TABLET BY MOUTH DAILY 11/16/17  Yes Bedsole, Amy E, MD  NON FORMULARY daily as needed. CBD OIL   Yes [provider]  simvastatin (ZOCOR) 40 MG tablet TAKE 1 TABLET BY MOUTH EACH NIGHT AT BEDTIME 08/07/18  Yes Bedsole, Amy E, MD  Calcium Citrate-Vitamin D (CALCIUM + D PO) Take 1 tablet by mouth daily.    [provider]  fexofenadine (ALLEGRA ALLERGY) 180 MG tablet Take 180 mg by mouth daily.    [provider]  montelukast (SINGULAIR) 10 MG tablet Take 1 tablet (10 mg total) by mouth at bedtime. 07/19/18   Bedsole, Amy E, MD  Multiple Vitamins-Minerals (MULTIVITAMIN PO) Take 1 tablet by mouth daily.    [provider]    Family History Family History  Problem Relation Age of Onset  . Heart attack Father 31  . Dementia Mother   . Stroke Mother 54  . Ovarian cancer Sister 74  . Diabetes Other        Maternal side  . Ovarian cancer Other        Grandmother  . Ovarian cancer Other        aunt  . Colon cancer Neg Hx     Social History Social History   Tobacco Use  . Smoking status: Never Smoker  . Smokeless tobacco: Never Used  Substance Use Topics  . Alcohol use: Yes    Comment: 1 glass of wine per month  . Drug use: No     Allergies   Codeine   Review of Systems Review of Systems   Physical Exam Triage Vital Signs ED Triage Vitals [09/04/18 1504]  Enc Vitals Group     BP (!) 143/70     Pulse Rate (!) 52     Resp 20     Temp (!) 97.5 F (36.4 C)     Temp Source Oral     SpO2 96 %     Weight      Height      Head Circumference      Peak Flow      Pain Score 0     Pain Loc      Pain Edu?      Excl. in  [place]?    No data found.  Updated Vital Signs BP (!) 143/70 (BP Location: Left Arm)   Pulse (!) 52   Temp (!) 97.5 F (36.4 C) (Oral)   Resp 20   SpO2 96%    Physical Exam Constitutional:      General: She is not in acute distress.    Appearance: She is well-developed.  Cardiovascular:     Rate and Rhythm: Bradycardia present. Rhythm irregular.  Comments: Occasional irregular Pulmonary:     Effort: Pulmonary effort is normal. No tachypnea or respiratory distress.     Breath sounds: Normal breath sounds. No decreased breath sounds, wheezing, rhonchi or rales.  Musculoskeletal:     Right lower leg: No edema.     Left lower leg: No edema.  Skin:    General: Skin is warm and dry.  Neurological:     Mental Status: She is alert and oriented to person, place, and time.    EKG: sinus bradycardia with pvc, rate of 49 . Previous EKG was available for review. No stwave changes as interpreted by me.    UC Treatments / Results  Labs (all labs ordered are listed, but only abnormal results are displayed) Labs Reviewed  CBC WITH DIFFERENTIAL/PLATELET  BASIC METABOLIC PANEL    EKG None  Radiology Dg Chest 2 View  Result Date: 09/04/2018 CLINICAL DATA:  83 year old female with shortness of breath EXAM: CHEST - 2 VIEW COMPARISON:  11/02/2017 FINDINGS: Cardiomediastinal silhouette unchanged in size and contour. No evidence of central vascular congestion or interlobular septal thickening. No pneumothorax or pleural effusion. No confluent airspace disease. Degenerative changes of the spine. No displaced fracture. IMPRESSION: Negative for acute cardiopulmonary disease Electronically Signed   By: Corrie Mckusick D.O.   On: 09/04/2018 15:27    Procedures Procedures (including critical care time)  Medications Ordered in UC Medications - No data to display  Initial Impression / Assessment and Plan / UC Course  I have reviewed the triage vital signs and the nursing notes.  Pertinent  labs & imaging results that were available during my care of the patient were reviewed by me and considered in my medical decision making (see chart for details).     Bradycardia on ekg and with vitals, this does not appear to be new for patient although PVC is present on ekg and consistent with exam. Non toxic. No increased work of breathing. Afebrile, no hypoxia. Lungs clear. Chest xray normal today. New medication, patient and husband unsure of what this is. Symptoms worse at night question some anxiety vs allergic post nasal drip as has been treated for in the past. Recommend follow up with PCP and/or cardiology. CBC pending to ensure no new anemia etc. Will notify of any positive findings and if any changes to treatment are needed.  Return precautions provided. Patient and husband verbalized understanding and agreeable to plan.  Ambulatory out of clinic without difficulty.     Final Clinical Impressions(s) / UC Diagnoses   Final diagnoses:  Shortness of breath  Bradycardia     Discharge Instructions     Your EKG and chest xray are reassuring today.  Your oxygen level is normal as is your respiratory rate.  Your heart rate is lower than normal rates but this appears to be normal for you.  If any worsening of symptoms, dizziness, shortness of breath , chest pain , palpitations, nausea, sweating, weakness, please go to the ER.  Please follow up with your primary care provider and/or your cardiologist for recheck for persistent symptoms.     ED Prescriptions    None     Controlled Substance Prescriptions [place] Controlled Substance Registry consulted? Not Applicable   Zigmund Gottron, NP 09/04/18 1605

## 2018-09-04 NOTE — Telephone Encounter (Signed)
Pt said she is having SOB, hard to breathe and get O2 in lungs; has been going on since 09/01/18 and pt is getting concerned. No CP but have pain or tired feeling in back between shoulder blades.no neck or jaw pain. Pt having heart palpitations at night on and off. No fever or cough and no travel and no known exposure to covid or flu. No H/A,dizziness or S/T. Pt does have sinus drainage and allergies. Now BP 128/73 P 61 but pt said she does not feel like she is getting oxygen. pts husband will take her to [facility]. I spoke with Verline Lema at [facility] as FYI and she said they would be glad to see pt but to let pt know they do not do covid testing. Pt voiced understanding and will go to [place] now.pt said she was not in distress that she need 911. FYI to Dr [name].

## 2018-09-05 ENCOUNTER — Telehealth (HOSPITAL_COMMUNITY): Payer: Self-pay | Admitting: Emergency Medicine

## 2018-09-05 LAB — BASIC METABOLIC PANEL WITH GFR
BUN/Creatinine Ratio: 23 (ref 12–28)
BUN: 20 mg/dL (ref 8–27)
CO2: 26 mmol/L (ref 20–29)
Calcium: 10.4 mg/dL — ABNORMAL HIGH (ref 8.7–10.3)
Chloride: 93 mmol/L — ABNORMAL LOW (ref 96–106)
Creatinine, Ser: 0.87 mg/dL (ref 0.57–1.00)
GFR calc Af Amer: 73 mL/min/1.73
GFR calc non Af Amer: 63 mL/min/1.73
Glucose: 125 mg/dL — ABNORMAL HIGH (ref 65–99)
Potassium: 3.7 mmol/L (ref 3.5–5.2)
Sodium: 138 mmol/L (ref 134–144)

## 2018-09-05 LAB — CBC WITH DIFFERENTIAL/PLATELET
Basophils Absolute: 0 x10E3/uL (ref 0.0–0.2)
Basos: 0 %
EOS (ABSOLUTE): 0.1 x10E3/uL (ref 0.0–0.4)
Eos: 1 %
Hematocrit: 42.4 % (ref 34.0–46.6)
Hemoglobin: 14.5 g/dL (ref 11.1–15.9)
Immature Grans (Abs): 0 x10E3/uL (ref 0.0–0.1)
Immature Granulocytes: 0 %
Lymphocytes Absolute: 1.8 x10E3/uL (ref 0.7–3.1)
Lymphs: 31 %
MCH: 31.8 pg (ref 26.6–33.0)
MCHC: 34.2 g/dL (ref 31.5–35.7)
MCV: 93 fL (ref 79–97)
Monocytes Absolute: 0.5 x10E3/uL (ref 0.1–0.9)
Monocytes: 9 %
Neutrophils Absolute: 3.5 x10E3/uL (ref 1.4–7.0)
Neutrophils: 59 %
Platelets: 252 x10E3/uL (ref 150–450)
RBC: 4.56 x10E6/uL (ref 3.77–5.28)
RDW: 13 % (ref 11.7–15.4)
WBC: 5.9 x10E3/uL (ref 3.4–10.8)

## 2018-09-05 NOTE — Telephone Encounter (Signed)
Attempted to reach patient about labs, no answer. No voicemail set up.

## 2018-09-25 NOTE — Telephone Encounter (Signed)
Yes.. please set up virtual OV for this patient.

## 2018-09-25 NOTE — Telephone Encounter (Signed)
Per chart review tab pt was seen 09/04/18 [facility] at [facility]. FYI to Dr [name]. Is FU needed?

## 2018-09-26 ENCOUNTER — Ambulatory Visit (INDEPENDENT_AMBULATORY_CARE_PROVIDER_SITE_OTHER): Payer: PPO | Admitting: Family Medicine

## 2018-09-26 ENCOUNTER — Encounter: Payer: Self-pay | Admitting: Family Medicine

## 2018-09-26 DIAGNOSIS — R0602 Shortness of breath: Secondary | ICD-10-CM

## 2018-09-26 DIAGNOSIS — M549 Dorsalgia, unspecified: Secondary | ICD-10-CM | POA: Diagnosis not present

## 2018-09-26 DIAGNOSIS — F068 Other specified mental disorders due to known physiological condition: Secondary | ICD-10-CM

## 2018-09-26 DIAGNOSIS — R413 Other amnesia: Secondary | ICD-10-CM

## 2018-09-26 MED ORDER — GALANTAMINE HYDROBROMIDE 4 MG PO TABS: 4.0000 mg | ORAL_TABLET | Freq: Two times a day (BID) | ORAL | 3 refills | Status: DC

## 2018-09-26 NOTE — Assessment & Plan Note (Signed)
Husband reports to patient worsening of memory. She has to write things down to remember them.  No improvement with prevagen and she has started recent new memory support vitamin.  She is interested in a trial of another medication.. had GI SE to aricept.  Will try low dose of galantamine. Follow up MMSE at [facility]/CP in 3 months.

## 2018-09-26 NOTE — Assessment & Plan Note (Signed)
No red flags and no fall.  Start with heat, tylenol prn and start home PT.  Pt is elderly and I am unable to exam today.. so if not improving in 2 weeks.. pt will come in for in office visit and lX-ray to rule out compression fracture.

## 2018-09-26 NOTE — Patient Instructions (Addendum)
Start galantamine for memory issues. Call if side effects occur. Start heat and upper back stretches as well as extra strength tylenol for back pain.  Let me know if this is not improving in next 2 weeks as we will need to have you come in for and appointment and office visit in person.  Work on stress reduction and relaxation techniques for possible anxiety cause of breathing issue.  CONSERVATIVE TREATMENTS FOR NECK PAIN-In most cases, neck pain can be treated conservatively with over-the-counter pain medications, ice, heat, massage, and strengthening and/or stretching exercises at home. If you still have pain or restrictions in movement restrictions after a few weeks of conservative treatment, see your health care provider for further evaluation. Pain relief-Acetaminophen (sample brand name: Tylenol) may help relieve mild to moderate neck pain. Ice-For some people, ice applied to the sore area can help relieve neck pain. In general, for acute injuries, ice is recommended as the initial treatment, especially if swelling is present. You can also relieve muscle tightness by placing a bag of ice, bag of frozen peas, or a frozen towel to the painful area. The cold source should be wrapped in a thin dry cloth layer before it is placed on the neck to protect the skin. The ice or cold pack should be left in place for 15 to 20 minutes to deeply penetrate the tissues; this can be repeated every two to four hours until symptoms improve. Skin damage can result from excessive use of ice, especially in people with poor skin sensation; it's a good idea to inspect your skin each time you apply ice. Look for changes in pigmentation (for example, lighter- or darker-colored areas) and let your health care provider know if you notice any problems. Heat-Heat can also help to reduce neck pain. Apply moist heat for 10 to 15 minutes with a shower, hot bath, or moist towel warmed in a microwave. If you use a heated  towel, be careful not to overheat, as this can cause injury. Stretching exercises-You can help restore and preserve your range of motion with exercises that stretch and strengthen the neck muscles. Range of motion exercises and stretching may help decrease pain from muscle injury. It is best to perform stretching exercises when the muscles are warm, such as after the application of heat, or after a few minutes of cardiovascular warm-up exercises. Exercises can be done in the morning to relieve stiffness and again at night before going to bed. Expect mild, achy muscle pain if you are new to exercise. If you have sharp or "electric" type pain in your shoulder or arm, tell your provider right away. If you have had a serious neck injury, don't try any new exercises or stretches before talking to your health care provider. Also, do not attempt these exercises if you have a pinched nerve in your neck, especially if there is pain or numbness into the arm and hand, unless recommended by a health care provider. While these exercises can dramatically improve symptoms of a pinched nerve, they can actually make the problem worse if done improperly. The most useful stretching exercises for the neck include the following: ?Neck bending - Tilt your head forward and try to touch your chin to your neck using a nodding motion. Hold for a few seconds, breathe in gradually, and exhale slowly with each exercise. Exhaling with the movement helps relax the muscles. Repeat 10 to 15 times. Relax your neck and back muscles with each neck bend. ?Shoulder rolls -  In a sitting or standing position, hold your arms at your sides with the elbows bent. Try to squeeze your shoulder blades together. Roll your shoulders backwards 10 to 15 times, moving in a rhythmic, rowing motion. Proper neck alignment posture is important during this exercise. Rest, then roll your shoulders forwards 10 to 15 times. Other exercises that may help include neck  rotation, neck tilting, vertical shoulder stretches, upper back stretches, and back bending: ?Neck rotation - Slowly look to the right. Hold for a few seconds. Look straight ahead and rest for a few seconds. Repeat 10 to 15 times, then perform on the left side. ?Neck tilting - Look straight forward, then tilt the top of your head to the right, trying to touch your right ear to your right shoulder (without raising your shoulder). Hold for a few seconds, then return your head to the center. Repeat 10 to 15 times, then perform on the left side. ?Vertical shoulder stretches - In the sitting or standing position, use your right hand to hold your left wrist and pull your arm (and shoulder) up and over your head, towards the right. Hold for five seconds, keeping your left shoulder and back muscles relaxed. Rest and repeat 10 to 15 times, then repeat on the other side (using your left hand to hold your right wrist). ?Upper back stretches - In the standing position, lean forward from your hips and rest both hands on a low surface with your elbows straight. Exhale, relax your neck and shoulders, and allow your head to fall forward as you round your upper back. This requires your shoulder blades to spread apart and mimics the motion of a cat stretching its back. Exhaling with the motion helps to relax your muscles. Return to the standing position, then repeat slowly 10 times. ?Upper back side bends - Stand or sit up straight in a chair. Bend your upper body to the right while holding your hands together slightly behind your neck for support. Hold for five seconds, and then return to center. Repeat to the left for a total of 10 to 15 times on each side. Keep your lower back straight or supported against a chair. Remember to keep breathing throughout the stretch. Stress reduction-Emotional stress can increase neck tension and interfere with or delay the recovery process. Reducing stress may help to prevent neck pain from  recurring. Relaxation techniques can relieve musculoskeletal tension. An example of a relaxation exercise is to take a deep breath in, hold it for a few seconds, and then exhale completely. Breathe normally for a few seconds, and then repeat. Other activities that may help to reduce stress include meditation, mindfulness-based stress reduction programs, cognitive behavioral therapy, or progressive muscle relaxation. Posture-You can help prevent or reduce neck pain by doing activities and using positions that emphasize a neutral neck position and minimize tension across the supporting muscles and ligaments. Extremes of range of motion and positions that cause constant tension should be minimized or avoided. It may help to: ?Avoid sitting in the same position for prolonged periods of time. If you work at a desk, try to take periodic five-minute breaks throughout the day. Avoid looking up or down at a computer monitor; adjust it to your eye level. Visual changes or new glasses prescriptions can also affect the way you hold your head and neck to read. ?Avoid putting pressure on your upper back with things like backpacks, over-the-shoulder purses, or carrying children. Alternatives may include wheeled backpacks or cases, handbags,  and having a child walk or ride in a stroller. ?Avoid doing overhead work for prolonged periods at a time. ?Maintain good posture by holding your head up and keeping your shoulders back and down. Pay attention to your neck and arm position when you are using your phone. ?Use the car or chair arm rests to keep your arms supported. Find the optimal seated alignment in your car for your entire spine and make sure the head rest is at a comfortable position for your neck. ?Sleep with your neck in a neutral position by using a small pillow under the nape of your neck (if you sleep on your back) or sleeping with enough pillows to keep your neck straight in line with your body (if you sleep  on your side). If you sleep on your back, putting a pillow under the knees can help to flatten the spine and relax the neck muscles. Avoid sleeping on your stomach with your head turned to one side. ?Carry heavy objects close to your body rather than with outstretched arms.

## 2018-09-26 NOTE — Assessment & Plan Note (Signed)
At rest.. better not worse with exertion.  Neg eval so far with CXR, and labs Most consistent with anxiety symptoms... Recommend stress reduction relaxation. If not improving consider treatment of mood

## 2018-09-26 NOTE — Telephone Encounter (Signed)
Tried calling pt voicemail not set up

## 2018-09-26 NOTE — Telephone Encounter (Signed)
Appointment 5/14 @ 4 Pt aware

## 2018-09-26 NOTE — Progress Notes (Signed)
VIRTUAL VISIT Due to national recommendations of social distancing due to [facility] 19, a virtual visit is felt to be most appropriate for this patient at this time.   I connected with the patient on 09/26/18 at  4:00 PM EDT by virtual telehealth platform and verified that I am speaking with the correct person using two identifiers.   I discussed the limitations, risks, security and privacy concerns of performing an evaluation and management service by  virtual telehealth platform and the availability of in person appointments. I also discussed with the patient that there may be a patient responsible charge related to this service. The patient expressed understanding and agreed to proceed.  Patient location: Home Provider Location: [facility] Participants: [name] and [name]   Chief Complaint  Patient presents with  . Follow-up    per Dr. [name]  . Back Pain    Upper back  . Fatigue    History of Present Illness: 83 year old female with history of DM, CAD, bradycardia, and HTN presents for follow urgent care visit on 09/04/2018. She was seen for SOB x 2-3 days. CXR: neg CBC:nml wbc and rbc  BMET: nml Patient is followed with cardiology, had a heart cath 10/2017 with non-obstructive cardiac disease. Hx of bradycardia per chart review, takes cardizem and metoprolol. Stress test last in 2014.   She reports minimal change in SOB... at rest, no chest pain. Feels like not getting wind down.. off and on..  She has been tired for some time.. not any worse. No swelling in ankles.   In last month she has had daily upper back pain. From neck to upp er bra line.  No change in activity, no fall  She has been less active overall. She treated with heat.  denies numbness or weakness in arms or legs. No incontinence   Husband states memory continue to worsen.Marland Kitchen HAs to write things down.  OTC meds not helping    Not worse with exercise.. She is walking daily COVID 19  screen No recent travel or known exposure to [place] The patient denies respiratory symptoms of COVID 19 at this time.  The importance of social distancing was discussed today.   Review of Systems  Constitutional: Negative for chills and fever.  HENT: Negative for congestion and ear pain.   Eyes: Negative for pain and redness.  Respiratory: Negative for cough and shortness of breath.   Cardiovascular: Negative for chest pain, palpitations and leg swelling.  Gastrointestinal: Negative for abdominal pain, blood in stool, constipation, diarrhea, nausea and vomiting.  Genitourinary: Negative for dysuria.  Musculoskeletal: Negative for falls and myalgias.  Skin: Negative for rash.  Neurological: Negative for dizziness.  Psychiatric/Behavioral: Negative for depression. The patient is not nervous/anxious.       Past Medical History:  Diagnosis Date  . Allergic rhinitis   . Diabetes mellitus without complication (HCC)    diet controlled  . GERD (gastroesophageal reflux disease)   . Hyperlipidemia   . Hypertension   . Osteoarthritis   . Urinary incontinence     reports that she has never smoked. She has never used smokeless tobacco. She reports current alcohol use. She reports that she does not use drugs.   Current Outpatient Medications:  .  Apoaequorin (PREVAGEN PO), Take 1 tablet by mouth daily., Disp: , Rfl:  .  aspirin 81 MG tablet, Take 81 mg by mouth at bedtime. , Disp: , Rfl:  .  Calcium Citrate-Vitamin D (CALCIUM +  D PO), Take 1 tablet by mouth daily., Disp: , Rfl:  .  Coenzyme Q10 (COQ10) 100 MG CAPS, Take 100 mg by mouth daily., Disp: , Rfl:  .  diltiazem (CARDIZEM CD) 120 MG 24 hr capsule, TAKE 1 TABLET BY MOUTH DAILY, Disp: 90 capsule, Rfl: 3 .  losartan-hydrochlorothiazide (HYZAAR) 100-25 MG tablet, TAKE 1 TABLET BY MOUTH DAILY, Disp: 90 tablet, Rfl: 2 .  metoprolol succinate (TOPROL-XL) 50 MG 24 hr tablet, TAKE 1 TABLET BY MOUTH DAILY, Disp: 90 tablet, Rfl: 3 .  Multiple  Vitamins-Minerals (MULTIVITAMIN PO), Take 1 tablet by mouth daily., Disp: , Rfl:  .  NON FORMULARY, daily as needed. CBD OIL, Disp: , Rfl:  .  NON FORMULARY, Brain Support-2 tablets daily, Disp: , Rfl:  .  Potassium 99 MG TABS, Take 1 tablet by mouth daily., Disp: , Rfl:  .  simvastatin (ZOCOR) 40 MG tablet, TAKE 1 TABLET BY MOUTH EACH NIGHT AT BEDTIME, Disp: 90 tablet, Rfl: 0   Observations/Objective: Blood pressure (!) 141/84, pulse (!) 59, height 5\' 2"  (1.575 m), weight 149 lb (67.6 kg).  Physical Exam  Physical Exam Constitutional:      General: The patient is not in acute distress. Pulmonary:     Effort: Pulmonary effort is normal. No respiratory distress.  Neurological:     Mental Status: The patient is alert and oriented to person, place, and time.  Psychiatric:        Mood and Affect: Mood normal.        Behavior: Behavior normal.   Full range of motion of head and neck,  No focal ttp per pt Bilateral upper back pain  Assessment and Plan SOB (shortness of breath) At rest.. better not worse with exertion.  Neg eval so far with CXR, and labs Most consistent with anxiety symptoms... Recommend stress reduction relaxation. If not improving consider treatment of mood  Mild memory disturbances not amounting to dementia Husband reports to patient worsening of memory. She has to write things down to remember them.  No improvement with prevagen and she has started recent new memory support vitamin.  She is interested in a trial of another medication.. had GI SE to aricept.  Will try low dose of galantamine. Follow up MMSE at [facility] in 3 months.     Upper back pain No red flags and no fall.  Start with heat, tylenol prn and start home PT.  Pt is elderly and I am unable to exam today.. so if not improving in 2 weeks.. pt will come in for in office visit and lX-ray to rule out compression fracture.     I discussed the assessment and treatment plan with the patient. The patient  was provided an opportunity to ask questions and all were answered. The patient agreed with the plan and demonstrated an understanding of the instructions.   The patient was advised to call back or seek an in-person evaluation if the symptoms worsen or if the condition fails to improve as anticipated.     [name], MD

## 2018-11-13 ENCOUNTER — Other Ambulatory Visit: Payer: Self-pay | Admitting: Family Medicine

## 2018-11-25 ENCOUNTER — Ambulatory Visit: Payer: Self-pay | Admitting: *Deleted

## 2018-11-25 ENCOUNTER — Ambulatory Visit
Admission: EM | Admit: 2018-11-25 | Discharge: 2018-11-25 | Disposition: A | Discharge status: Home / Self Care | Payer: PPO

## 2018-11-25 ENCOUNTER — Encounter: Payer: Self-pay | Admitting: Emergency Medicine

## 2018-11-25 ENCOUNTER — Other Ambulatory Visit: Payer: Self-pay

## 2018-11-25 DIAGNOSIS — R0981 Nasal congestion: Secondary | ICD-10-CM

## 2018-11-25 DIAGNOSIS — I10 Essential (primary) hypertension: Secondary | ICD-10-CM

## 2018-11-25 MED ORDER — FLUTICASONE PROPIONATE 50 MCG/ACT NA SUSP: 1.0000 | Freq: Every day | NASAL | 2 refills | Status: DC

## 2018-11-25 NOTE — ED Provider Notes (Signed)
[facility]    CSN: [id number] Arrival date & time: 11/25/18  1503      History   Chief Complaint Chief Complaint  Patient presents with  . Nasal Congestion    HPI Janet Chapman is a 83 y.o. female.   Pt reports she has had nasal drainage.  Patient reports she has had nasal congestion for several months she has been treated by multiple providers with multiple medications she is currently using a over-the-counter nasal spray every hour.  Patient does not know the name of the nasal spray that she is using but is the decongestant.  Suspect patient is using generic Afrin.  Patient reports she has drainage down the back of her throat.  She reports so much congestion in her nose that she cannot breathe through her nose.  Patient denies any fever or chills she has not had a cough she denies any lung complaints she does not feel sick just congested  The history is provided by the patient. No language interpreter was used.  Facial Injury   Past Medical History:  Diagnosis Date  . Allergic rhinitis   . Diabetes mellitus without complication (HCC)    diet controlled  . GERD (gastroesophageal reflux disease)   . Hyperlipidemia   . Hypertension   . Osteoarthritis   . Urinary incontinence     Patient Active Problem List   Diagnosis Date Noted  . Upper back pain 09/26/2018  . Sinus drainage 07/01/2018  . Chronic intractable headache 01/03/2018  . Nostril sore 12/13/2017  . Mouth sore 12/13/2017  . Medicare annual wellness visit, subsequent 12/04/2017  . Coronary artery disease involving native coronary artery of native heart with unstable angina pectoris ([place])   . SOB (shortness of breath) 11/02/2017  . Bradycardia 06/12/2017  . Palpitations 06/12/2017  . Spider varicose veins 07/22/2015  . Advanced care planning/counseling discussion 07/08/2015  . 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 Chronic radicular low back pain 01/02/2014  .  Atrophic vaginitis 09/26/2013  . Pulmonary nodule, right 05/23/2013  . Tinea pedis 12/20/2012  . INSOMNIA, CHRONIC 01/14/2010  . Diabetes mellitus, controlled ([place]) 11/11/2008  . MAMMOGRAM, ABNORMAL, LEFT 11/09/2008  . Chronic idiopathic constipation 09/11/2007  . OBESITY 05/31/2007  . Allergic rhinitis 05/31/2007  . GERD 05/31/2007  . OSTEOARTHRITIS 05/31/2007  . URINARY INCONTINENCE 05/31/2007  . High cholesterol 04/19/2007  . Essential hypertension, benign 04/19/2007  . ACTINIC KERATOSIS 02/01/2007    Past Surgical History:  Procedure Laterality Date  . ABDOMINAL HYSTERECTOMY  1993   TA  . CARDIOVASCULAR STRESS TEST  2004   [facility]. [facility], cardiolite-normal  . CHOLECYSTECTOMY  [phone]  . COLONOSCOPY    . LEFT HEART CATH AND CORONARY ANGIOGRAPHY N/A 11/06/2017   Procedure: LEFT HEART CATH AND CORONARY ANGIOGRAPHY;  Surgeon: Burnell Blanks, MD;  Location: [facility];  Service: Cardiovascular;  Laterality: N/A;    OB History   No obstetric history on file.      Home Medications    Prior to Admission medications   Medication Sig Start Date End Date Taking? Authorizing Provider  montelukast (SINGULAIR) 10 MG tablet Take 10 mg by mouth at bedtime.   Yes [provider]  Apoaequorin (PREVAGEN PO) Take 1 tablet by mouth daily.    [provider]  aspirin 81 MG tablet Take 81 mg by mouth at bedtime.     [provider]  Calcium Citrate-Vitamin D (CALCIUM + D PO) Take 1  tablet by mouth daily.    [provider]  Coenzyme Q10 (COQ10) 100 MG CAPS Take 100 mg by mouth daily.    [provider]  diltiazem (CARDIZEM CD) 120 MG 24 hr capsule TAKE 1 TABLET BY MOUTH DAILY 11/16/17   Bedsole, Amy E, MD  fluticasone (FLONASE) 50 MCG/ACT nasal spray Place 1 spray into both nostrils daily. 11/25/18 11/25/19  Fransico Meadow, PA-C  galantamine (RAZADYNE) 4 MG tablet Take 1 tablet (4 mg total) by mouth 2 (two) times daily with a meal. 09/26/18    Bedsole, Amy E, MD  losartan-hydrochlorothiazide (HYZAAR) 100-25 MG tablet TAKE 1 TABLET BY MOUTH DAILY 05/07/18   Bedsole, Amy E, MD  metoprolol succinate (TOPROL-XL) 50 MG 24 hr tablet TAKE 1 TABLET BY MOUTH DAILY 11/16/17   Bedsole, Amy E, MD  Multiple Vitamins-Minerals (MULTIVITAMIN PO) Take 1 tablet by mouth daily.    [provider]  NON FORMULARY daily as needed. CBD OIL    [provider]  NON FORMULARY Brain Support-2 tablets daily    [provider]  Potassium 99 MG TABS Take 1 tablet by mouth daily.    [provider]  simvastatin (ZOCOR) 40 MG tablet TAKE 1 TABLET BY MOUTH EACH NIGHT AT BEDTIME 11/13/18   [name], MD    Family History Family History  Problem Relation Age of Onset  . Heart attack Father 96  . Dementia Mother   . Stroke Mother 73  . Ovarian cancer Sister 2  . Diabetes Other        Maternal side  . Ovarian cancer Other        Grandmother  . Ovarian cancer Other        aunt  . Colon cancer Neg Hx     Social History Social History   Tobacco Use  . Smoking status: Never Smoker  . Smokeless tobacco: Never Used  Substance Use Topics  . Alcohol use: Yes    Comment: 1 glass of wine per month  . Drug use: No     Allergies   Codeine   Review of Systems Review of Systems  All other systems reviewed and are negative.    Physical Exam Triage Vital Signs ED Triage Vitals [11/25/18 1524]  Enc Vitals Group     BP (!) 166/84     Pulse Rate (!) 54     Resp 16     Temp 98.3 F (36.8 C)     Temp Source Oral     SpO2 97 %     Weight      Height      Head Circumference      Peak Flow      Pain Score 0     Pain Loc      Pain Edu?      Excl. in [place]?    No data found.  Updated Vital Signs BP (!) 166/84 (BP Location: Left Arm)   Pulse (!) 54   Temp 98.3 F (36.8 C) (Oral)   Resp 16   SpO2 97%   Visual Acuity Right Eye Distance:   Left Eye Distance:   Bilateral Distance:    Right Eye Near:    Left Eye Near:    Bilateral Near:     Physical Exam Vitals signs and nursing note reviewed.  Constitutional:      Appearance: She is well-developed.  HENT:     Head: Normocephalic.     Nose:  Comments: Patient has swollen nasal turbinates.    Mouth/Throat:     Mouth: Mucous membranes are moist.  Neck:     Musculoskeletal: Normal range of motion.  Cardiovascular:     Rate and Rhythm: Normal rate.  Pulmonary:     Effort: Pulmonary effort is normal.  Abdominal:     General: There is no distension.  Musculoskeletal: Normal range of motion.  Skin:    General: Skin is warm.  Neurological:     General: No focal deficit present.     Mental Status: She is alert and oriented to person, place, and time.  Psychiatric:        Mood and Affect: Mood normal.      UC Treatments / Results  Labs (all labs ordered are listed, but only abnormal results are displayed) Labs Reviewed - No data to display  EKG   Radiology No results found.  Procedures Procedures (including critical care time)  Medications Ordered in UC Medications - No data to display  Initial Impression / Assessment and Plan / UC Course  I have reviewed the triage vital signs and the nursing notes.  Pertinent labs & imaging results that were available during my care of the patient were reviewed by me and considered in my medical decision making (see chart for details).     I suspect part of patient's problem may be overuse of Afrin.  She may have some rebound nasal turbinate swelling.  I have asked her to stop using the over-the-counter decongestant she is to try Flonase for the next week.  Due to patient's history of multiple nasal and sinus problems I have advised her that I think she should follow-up with ENT she is given the phone number for [facility] to call and schedule an appointment Final Clinical Impressions(s) / UC Diagnoses   Final diagnoses:  None     Discharge Instructions     Return  if any problems.  Stop using current nasal spray Schedule to see the ENT for evaluation if symptoms persist    ED Prescriptions    Medication Sig Dispense Auth. Provider   fluticasone (FLONASE) 50 MCG/ACT nasal spray Place 1 spray into both nostrils daily. 9.9 mL Fransico Meadow, PA-C     Controlled Substance Prescriptions  Controlled Substance Registry consulted? Not Applicable   Fransico Meadow, [place] 11/25/18 1600

## 2018-11-25 NOTE — Discharge Instructions (Addendum)
Return if any problems.  Stop using current nasal spray Schedule to see the ENT for evaluation if symptoms persist

## 2018-11-25 NOTE — Telephone Encounter (Signed)
Call received from LB at [facility] to triage this patient for shortness of breath.  Pt stated that her shortness of breath is coming from her nose being stopped up. She can not get air through.  She stated that she has mucous that she can not get out. She has to breath thru her mouth. She coughs becasue of the drainage running down the back of her throat. No headache or sinus pain. She has had this problem since March but it is getting worst. She did not sleep at all last night.  She was seen at an urgent care in April for this stuffy nose and had xrays but did not see anything. She has tried nasal spray but nothing is helping. No fever, her temp is 98.3 right. Advised her to got to an urgent care because of not being able to pass air through her nasal passage. She voiced understanding and will go the closest one on [address]. Routing to LB at [facility] for review.  Reason for Disposition . [1] Sinus congestion (pressure, fullness) AND [2] present > 10 days  Answer Assessment - Initial Assessment Questions 1. LOCATION: "Where does it hurt?"      No pain 2. ONSET: "When did the sinus pain start?"  (e.g., hours, days)      No pain 3. SEVERITY: "How bad is the pain?"   (Scale 1-10; mild, moderate or severe)   - MILD (1-3): doesn't interfere with normal activities    - MODERATE (4-7): interferes with normal activities (e.g., work or school) or awakens from sleep   - SEVERE (8-10): excruciating pain and patient unable to do any normal activities        n/a 4. RECURRENT SYMPTOM: "Have you ever had sinus problems before?" If so, ask: "When was the last time?" and "What happened that time?"      Yes and now feels like it is getting worst 5. NASAL CONGESTION: "Is the nose blocked?" If so, ask, "Can you open it or must you breathe through the mouth?"     Yes it is blocked and can breath thru her nose 6. NASAL DISCHARGE: "Do you have discharge from your nose?" If so ask, "What color?"     Not  getting anything out 7. FEVER: "Do you have a fever?" If so, ask: "What is it, how was it measured, and when did it start?"      No fever, temp is 98.3 8. OTHER SYMPTOMS: "Do you have any other symptoms?" (e.g., sore throat, cough, earache, difficulty breathing)     Cough from drainage from her nose 9. PREGNANCY: "Is there any chance you are pregnant?" "When was your last menstrual period?"     n/a  Protocols used: SINUS PAIN OR CONGESTION-A-AH

## 2018-11-25 NOTE — ED Triage Notes (Signed)
Pt presents to [facility] for assessment of nasal congestion, post-nasal drip and difficulty taking a deep breath.  Patient states she has been seen multiple times for this.  Patient has been using a nasal spray (unsure of the name, but it sounds like Atrovent) every hour for almost a month and states it gives her increased post-nasal drip and only lasts about an hour when she uses it.  Pt recently added montelukast to her home meds due to her PCP.

## 2018-11-25 NOTE — ED Notes (Signed)
Patient able to ambulate independently  

## 2018-11-26 DIAGNOSIS — J342 Deviated nasal septum: Secondary | ICD-10-CM | POA: Insufficient documentation

## 2018-11-26 DIAGNOSIS — R0982 Postnasal drip: Secondary | ICD-10-CM | POA: Diagnosis not present

## 2018-11-26 DIAGNOSIS — R0602 Shortness of breath: Secondary | ICD-10-CM | POA: Diagnosis not present

## 2018-11-26 DIAGNOSIS — J343 Hypertrophy of nasal turbinates: Secondary | ICD-10-CM | POA: Diagnosis not present

## 2018-11-26 NOTE — Telephone Encounter (Signed)
Noted  

## 2018-11-29 ENCOUNTER — Other Ambulatory Visit: Payer: Self-pay | Admitting: Family Medicine

## 2018-12-02 ENCOUNTER — Ambulatory Visit
Admission: EM | Admit: 2018-12-02 | Discharge: 2018-12-02 | Disposition: A | Discharge status: Home / Self Care | Payer: PPO | Attending: Physician Assistant | Admitting: Physician Assistant

## 2018-12-02 ENCOUNTER — Other Ambulatory Visit: Payer: Self-pay

## 2018-12-02 DIAGNOSIS — N3090 Cystitis, unspecified without hematuria: Secondary | ICD-10-CM

## 2018-12-02 LAB — POCT URINALYSIS DIP (MANUAL ENTRY)
Bilirubin, UA: NEGATIVE
Glucose, UA: NEGATIVE mg/dL
Ketones, POC UA: NEGATIVE mg/dL
Nitrite, UA: NEGATIVE
Protein Ur, POC: NEGATIVE mg/dL
Spec Grav, UA: 1.02 (ref 1.010–1.025)
Urobilinogen, UA: 0.2 E.U./dL
pH, UA: 5.5 (ref 5.0–8.0)

## 2018-12-02 MED ORDER — CEPHALEXIN 500 MG PO CAPS: 500.0000 mg | ORAL_CAPSULE | Freq: Three times a day (TID) | ORAL | 0 refills | Status: AC

## 2018-12-02 NOTE — ED Provider Notes (Signed)
[facility]    CSN: [id number] Arrival date & time: 12/02/18  1017     History   Chief Complaint Chief Complaint  Patient presents with  . Urinary Tract Infection    HPI Janet Chapman is a 83 y.o. female.   83 year old female with history of DM, diet controlled, HTN, HLD comes in for few day history of urinary symptoms.  States it was intermittent at first, with urinary pressure, urgency, hesitancy, frequency.  Since 2 days ago, this has been more constant.  Denies abdominal pain, nausea, vomiting.  Denies back/flank pain.  Denies fever, chills, night sweats.  Denies hematuria.  Denies vaginal discharge/itching.  Has not taken anything for the symptoms.     Past Medical History:  Diagnosis Date  . Allergic rhinitis   . Diabetes mellitus without complication (HCC)    diet controlled  . GERD (gastroesophageal reflux disease)   . Hyperlipidemia   . Hypertension   . Osteoarthritis   . Urinary incontinence     Patient Active Problem List   Diagnosis Date Noted  . Upper back pain 09/26/2018  . Sinus drainage 07/01/2018  . Chronic intractable headache 01/03/2018  . Nostril sore 12/13/2017  . Mouth sore 12/13/2017  . Medicare annual wellness visit, subsequent 12/04/2017  . Coronary artery disease involving native coronary artery of native heart with unstable angina pectoris ([place])   . SOB (shortness of breath) 11/02/2017  . Bradycardia 06/12/2017  . Palpitations 06/12/2017  . Spider varicose veins 07/22/2015  . Advanced care planning/counseling discussion 07/08/2015  . 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 Chronic radicular low back pain 01/02/2014  . Atrophic vaginitis 09/26/2013  . Pulmonary nodule, right 05/23/2013  . Tinea pedis 12/20/2012  . INSOMNIA, CHRONIC 01/14/2010  . Diabetes mellitus, controlled ([place]) 11/11/2008  . MAMMOGRAM, ABNORMAL, LEFT 11/09/2008  . Chronic idiopathic constipation 09/11/2007  . OBESITY  05/31/2007  . Allergic rhinitis 05/31/2007  . GERD 05/31/2007  . OSTEOARTHRITIS 05/31/2007  . URINARY INCONTINENCE 05/31/2007  . High cholesterol 04/19/2007  . Essential hypertension, benign 04/19/2007  . ACTINIC KERATOSIS 02/01/2007    Past Surgical History:  Procedure Laterality Date  . ABDOMINAL HYSTERECTOMY  1993   TA  . CARDIOVASCULAR STRESS TEST  2004   [facility]. [facility], cardiolite-normal  . CHOLECYSTECTOMY  [phone]  . COLONOSCOPY    . LEFT HEART CATH AND CORONARY ANGIOGRAPHY N/A 11/06/2017   Procedure: LEFT HEART CATH AND CORONARY ANGIOGRAPHY;  Surgeon: Burnell Blanks, MD;  Location: [facility];  Service: Cardiovascular;  Laterality: N/A;    OB History   No obstetric history on file.      Home Medications    Prior to Admission medications   Medication Sig Start Date End Date Taking? Authorizing Provider  Apoaequorin (PREVAGEN PO) Take 1 tablet by mouth daily.    [provider]  aspirin 81 MG tablet Take 81 mg by mouth at bedtime.     [provider]  Calcium Citrate-Vitamin D (CALCIUM + D PO) Take 1 tablet by mouth daily.    [provider]  cephALEXin (KEFLEX) 500 MG capsule Take 1 capsule (500 mg total) by mouth 3 (three) times daily for 7 days. 12/02/18 12/09/18  Ok Edwards, PA-C  Coenzyme Q10 (COQ10) 100 MG CAPS Take 100 mg by mouth daily.    [provider]  diltiazem (CARDIZEM CD) 120 MG 24 hr capsule TAKE 1 TABLET BY MOUTH DAILY 11/16/17   [name], Emmons Toth  E, MD  fluticasone (FLONASE) 50 MCG/ACT nasal spray Place 1 spray into both nostrils daily. 11/25/18 11/25/19  Fransico Meadow, PA-C  galantamine (RAZADYNE) 4 MG tablet Take 1 tablet (4 mg total) by mouth 2 (two) times daily with a meal. 09/26/18   Bedsole, Rydell Wiegel E, MD  losartan-hydrochlorothiazide (HYZAAR) 100-25 MG tablet TAKE 1 TABLET BY MOUTH DAILY 05/07/18   Bedsole, Mable Lashley E, MD  metoprolol succinate (TOPROL-XL) 50 MG 24 hr tablet TAKE 1 TABLET BY MOUTH DAILY 11/29/18   Bedsole,  Angelino Rumery E, MD  montelukast (SINGULAIR) 10 MG tablet Take 10 mg by mouth at bedtime.    [provider]  Multiple Vitamins-Minerals (MULTIVITAMIN PO) Take 1 tablet by mouth daily.    [provider]  NON FORMULARY daily as needed. CBD OIL    [provider]  NON FORMULARY Brain Support-2 tablets daily    [provider]  Potassium 99 MG TABS Take 1 tablet by mouth daily.    [provider]  simvastatin (ZOCOR) 40 MG tablet TAKE 1 TABLET BY MOUTH EACH NIGHT AT BEDTIME 11/13/18   [name], MD    Family History Family History  Problem Relation Age of Onset  . Heart attack Father 71  . Dementia Mother   . Stroke Mother 54  . Ovarian cancer Sister 81  . Diabetes Other        Maternal side  . Ovarian cancer Other        Grandmother  . Ovarian cancer Other        aunt  . Colon cancer Neg Hx     Social History Social History   Tobacco Use  . Smoking status: Never Smoker  . Smokeless tobacco: Never Used  Substance Use Topics  . Alcohol use: Yes    Comment: 1 glass of wine per month  . Drug use: No     Allergies   Codeine   Review of Systems Review of Systems  Reason unable to perform ROS: See HPI as above.     Physical Exam Triage Vital Signs ED Triage Vitals  Enc Vitals Group     BP 12/02/18 1024 (!) 170/81     Pulse Rate 12/02/18 1024 (!) 58     Resp 12/02/18 1024 18     Temp 12/02/18 1024 97.9 F (36.6 C)     Temp Source 12/02/18 1024 Oral     SpO2 12/02/18 1024 97 %     Weight --      Height --      Head Circumference --      Peak Flow --      Pain Score 12/02/18 1025 0     Pain Loc --      Pain Edu? --      Excl. in [place]? --    No data found.  Updated Vital Signs BP (!) 170/81 (BP Location: Left Arm)   Pulse (!) 58   Temp 97.9 F (36.6 C) (Oral)   Resp 18   SpO2 97%   Visual Acuity Right Eye Distance:   Left Eye Distance:   Bilateral Distance:    Right Eye Near:   Left Eye Near:    Bilateral  Near:     Physical Exam Constitutional:      General: She is not in acute distress.    Appearance: She is well-developed. She is not ill-appearing, toxic-appearing or diaphoretic.  HENT:     Head: Normocephalic and atraumatic.  Eyes:  Conjunctiva/sclera: Conjunctivae normal.     Pupils: Pupils are equal, round, and reactive to light.  Cardiovascular:     Rate and Rhythm: Normal rate and regular rhythm.     Heart sounds: Normal heart sounds. No murmur. No friction rub. No gallop.   Pulmonary:     Effort: Pulmonary effort is normal.     Breath sounds: Normal breath sounds. No wheezing or rales.  Abdominal:     General: Bowel sounds are normal.     Palpations: Abdomen is soft.     Tenderness: There is no abdominal tenderness. There is no right CVA tenderness, left CVA tenderness, guarding or rebound.  Skin:    General: Skin is warm and dry.  Neurological:     Mental Status: She is alert and oriented to person, place, and time.  Psychiatric:        Behavior: Behavior normal.        Judgment: Judgment normal.     UC Treatments / Results  Labs (all labs ordered are listed, but only abnormal results are displayed) Labs Reviewed  POCT URINALYSIS DIP (MANUAL ENTRY) - Abnormal; Notable for the following components:      Result Value   Clarity, UA cloudy (*)    Blood, UA moderate (*)    Leukocytes, UA Large (3+) (*)    All other components within normal limits  URINE CULTURE    EKG  Radiology No results found.  Procedures Procedures (including critical care time)  Medications Ordered in UC Medications - No data to display  Initial Impression / Assessment and Plan / UC Course  I have reviewed the triage vital signs and the nursing notes.  Pertinent labs & imaging results that were available during my care of the patient were reviewed by me and considered in my medical decision making (see chart for details).    Urine dipstick positive for UTI. Start antibiotics as  directed. Push fluids. Return precautions given.  Final Clinical Impressions(s) / UC Diagnoses   Final diagnoses:  Cystitis   ED Prescriptions    Medication Sig Dispense Auth. Provider   cephALEXin (KEFLEX) 500 MG capsule Take 1 capsule (500 mg total) by mouth 3 (three) times daily for 7 days. 21 capsule Tobin Chad, [place] 12/02/18 1125

## 2018-12-02 NOTE — Discharge Instructions (Signed)
Your urine was positive for an urinary tract infection. Start keflex as directed. Keep hydrated, urine should be clear to pale yellow in color. Monitor for any worsening of symptoms, fever, worsening abdominal pain, nausea/vomiting, flank pain, follow up for reevaluation.  ° °

## 2018-12-02 NOTE — ED Triage Notes (Signed)
Pt c/o  UTI s/sx's since Thursday, urinary difficult and pressure

## 2018-12-03 LAB — URINE CULTURE

## 2018-12-06 ENCOUNTER — Other Ambulatory Visit: Payer: Self-pay | Admitting: Family Medicine

## 2018-12-06 NOTE — Telephone Encounter (Signed)
Last fill 11/16/2017  #90/3 Last OV 09/26/2018 Next OV 12/18/2018

## 2018-12-16 ENCOUNTER — Telehealth: Payer: Self-pay | Admitting: Family Medicine

## 2018-12-16 NOTE — Telephone Encounter (Signed)
Best number [phone]  I spoke with pt to change medicare wellness appointment to lab only.  When I was asking pt the covid screening questions she stated she was having a little sob.  She saw you on 5/14  Urgent care on 7/13 and ENTon 7/14 for sob/allergies.  Is it ok for pt to come in office for labs and cpx with you?

## 2018-12-17 ENCOUNTER — Telehealth: Payer: Self-pay | Admitting: Family Medicine

## 2018-12-17 DIAGNOSIS — E0821 Diabetes mellitus due to underlying condition with diabetic nephropathy: Secondary | ICD-10-CM

## 2018-12-17 DIAGNOSIS — R0602 Shortness of breath: Secondary | ICD-10-CM

## 2018-12-17 NOTE — Telephone Encounter (Signed)
-----   Message from Cloyd Stagers, RT sent at 12/16/2018  2:07 PM EDT ----- Regarding: Lab Orders for Wednesday 8.5.2020 Please place lab orders for Wednesday 8.5.2020, office visit for physical on Friday 8.7.2020 Thank you, Dyke Maes RT(R)

## 2018-12-17 NOTE — Telephone Encounter (Signed)
[place] to have in office visit given this is chronic SOB.

## 2018-12-17 NOTE — Telephone Encounter (Signed)
Pt aware.

## 2018-12-18 ENCOUNTER — Other Ambulatory Visit (INDEPENDENT_AMBULATORY_CARE_PROVIDER_SITE_OTHER): Payer: PPO

## 2018-12-18 ENCOUNTER — Ambulatory Visit: Payer: PPO

## 2018-12-18 ENCOUNTER — Other Ambulatory Visit: Payer: Self-pay

## 2018-12-18 DIAGNOSIS — E119 Type 2 diabetes mellitus without complications: Secondary | ICD-10-CM

## 2018-12-18 DIAGNOSIS — R0602 Shortness of breath: Secondary | ICD-10-CM | POA: Diagnosis not present

## 2018-12-18 DIAGNOSIS — E0821 Diabetes mellitus due to underlying condition with diabetic nephropathy: Secondary | ICD-10-CM

## 2018-12-18 LAB — COMPREHENSIVE METABOLIC PANEL
ALT: 12 U/L (ref 0–35)
AST: 15 U/L (ref 0–37)
Albumin: 4.8 g/dL (ref 3.5–5.2)
Alkaline Phosphatase: 62 U/L (ref 39–117)
BUN: 20 mg/dL (ref 6–23)
CO2: 36 mEq/L — ABNORMAL HIGH (ref 19–32)
Calcium: 10.4 mg/dL (ref 8.4–10.5)
Chloride: 96 mEq/L (ref 96–112)
Creatinine, Ser: 0.85 mg/dL (ref 0.40–1.20)
GFR: 64.19 mL/min (ref >60.00)
Glucose, Bld: 102 mg/dL — ABNORMAL HIGH (ref 70–99)
Potassium: 3.8 mEq/L (ref 3.5–5.1)
Sodium: 141 mEq/L (ref 135–145)
Total Bilirubin: 0.7 mg/dL (ref 0.2–1.2)
Total Protein: 7 g/dL (ref 6.0–8.3)

## 2018-12-18 LAB — CBC WITH DIFFERENTIAL/PLATELET
Basophils Absolute: 0 10*3/uL (ref 0.0–0.1)
Basophils Relative: 0.2 % (ref 0.0–3.0)
Eosinophils Absolute: 0.1 10*3/uL (ref 0.0–0.7)
Eosinophils Relative: 2.2 % (ref 0.0–5.0)
HCT: 42.1 % (ref 36.0–46.0)
Hemoglobin: 14.3 g/dL (ref 12.0–15.0)
Lymphocytes Relative: 34.1 % (ref 12.0–46.0)
Lymphs Abs: 1.9 10*3/uL (ref 0.7–4.0)
MCHC: 33.9 g/dL (ref 30.0–36.0)
MCV: 95.8 fl (ref 78.0–100.0)
Monocytes Absolute: 0.7 10*3/uL (ref 0.1–1.0)
Monocytes Relative: 12 % (ref 3.0–12.0)
Neutro Abs: 2.9 10*3/uL (ref 1.4–7.7)
Neutrophils Relative %: 51.5 % (ref 43.0–77.0)
Platelets: 215 10*3/uL (ref 150.0–400.0)
RBC: 4.39 Mil/uL (ref 3.87–5.11)
RDW: 14 % (ref 11.5–15.5)
WBC: 5.7 10*3/uL (ref 4.0–10.5)

## 2018-12-18 LAB — BRAIN NATRIURETIC PEPTIDE: Pro B Natriuretic peptide (BNP): 121 pg/mL — ABNORMAL HIGH (ref 0.0–100.0)

## 2018-12-18 LAB — T4, FREE: Free T4: 0.91 ng/dL (ref 0.60–1.60)

## 2018-12-18 LAB — LIPID PANEL
Cholesterol: 162 mg/dL (ref 0–200)
HDL: 62.8 mg/dL (ref >39.00)
LDL Cholesterol: 85 mg/dL (ref 0–99)
NonHDL: 99.68
Total CHOL/HDL Ratio: 3
Triglycerides: 73 mg/dL (ref 0.0–149.0)
VLDL: 14.6 mg/dL (ref 0.0–40.0)

## 2018-12-18 LAB — TSH: TSH: 3.92 u[IU]/mL (ref 0.35–4.50)

## 2018-12-18 LAB — HEMOGLOBIN A1C: Hgb A1c MFr Bld: 6.4 % (ref 4.6–6.5)

## 2018-12-18 LAB — T3, FREE: T3, Free: 2.7 pg/mL (ref 2.3–4.2)

## 2018-12-18 NOTE — Progress Notes (Signed)
No critical labs need to be addressed urgently. We will discuss labs in detail at upcoming office visit.   

## 2018-12-20 ENCOUNTER — Ambulatory Visit (INDEPENDENT_AMBULATORY_CARE_PROVIDER_SITE_OTHER): Payer: PPO | Admitting: Family Medicine

## 2018-12-20 ENCOUNTER — Encounter: Payer: Self-pay | Admitting: Family Medicine

## 2018-12-20 ENCOUNTER — Other Ambulatory Visit: Payer: Self-pay

## 2018-12-20 VITALS — BP 100/52 | HR 47 | Temp 98.2°F | Ht 62.25 in | Wt 145.5 lb

## 2018-12-20 DIAGNOSIS — J3089 Other allergic rhinitis: Secondary | ICD-10-CM

## 2018-12-20 DIAGNOSIS — E0821 Diabetes mellitus due to underlying condition with diabetic nephropathy: Secondary | ICD-10-CM

## 2018-12-20 DIAGNOSIS — R0602 Shortness of breath: Secondary | ICD-10-CM | POA: Diagnosis not present

## 2018-12-20 DIAGNOSIS — Z0000 Encounter for general adult medical examination without abnormal findings: Secondary | ICD-10-CM

## 2018-12-20 DIAGNOSIS — R413 Other amnesia: Secondary | ICD-10-CM

## 2018-12-20 DIAGNOSIS — E7800 Pure hypercholesterolemia, unspecified: Secondary | ICD-10-CM

## 2018-12-20 DIAGNOSIS — I25110 Atherosclerotic heart disease of native coronary artery with unstable angina pectoris: Secondary | ICD-10-CM

## 2018-12-20 DIAGNOSIS — I10 Essential (primary) hypertension: Secondary | ICD-10-CM | POA: Diagnosis not present

## 2018-12-20 DIAGNOSIS — F068 Other specified mental disorders due to known physiological condition: Secondary | ICD-10-CM

## 2018-12-20 LAB — HM DIABETES FOOT EXAM

## 2018-12-20 NOTE — Progress Notes (Signed)
Chief Complaint  Patient presents with  . Medicare Wellness    History of Present Illness: HPI   The patient presents for annual medicare wellness, complete physical and review of chronic health problems. He/She also has the following acute concerns today: She has had several urgent care visits in last several months   1 for nasal congestion and SOB Saw ENT.. felt nasal issues not cause of SOB. Does still have a lot of nasal congestion despite nasal saline ands flonase.  more recently for cystitis.Marland Kitchen treated with keflex.  Urine culture showed multiple species. At rest.. better not worse with exertion.  Neg eval so far with CXR, and labs Most consistent with anxiety symptoms... Recommend stress reduction relaxation. If not improving consider treatment of mood nml CBC, slightly elevated BNP, nml TSH Last year heart catheterization: nml LV function, mild CAD.Marland Kitchen medical management recommended.   no cough, no fever.  I have personally reviewed the Medicare Annual Wellness questionnaire and have noted 1. The patient's medical and social history 2. Their use of alcohol, tobacco or illicit drugs 3. Their current medications and supplements 4. The patient's functional ability including ADL's, fall risks, home safety risks and hearing or visual             impairment. 5. Diet and physical activities 6. Evidence for depression or mood disorders 7.         Updated provider list Cognitive evaluation was performed and recorded on pt medicare questionnaire form. The patients weight, height, BMI and visual acuity have been recorded in the chart  I have made referrals, counseling and provided education to the patient based review of the above and I have provided the pt with a written personalized care plan for preventive services.   Documentation of this information was scanned into the electronic record under the media tab.   Advance directives and end of life planning reviewed in detail with  patient and documented in EMR. Patient given handout on advance care directives if needed. HCPOA and living will updated if needed.  Fall Risk  12/20/2018 12/04/2017 11/27/2016 10/01/2015 07/08/2015  Falls in the past year? 0 No No No No     Office Visit from 12/20/2018 in [place] at [facility] Total Score  0       Started on galantamine for dementia Diabetes:   Good control on no med. Lab Results  Component Value Date   HGBA1C 6.4 12/18/2018  Using medications without difficulties: Hypoglycemic episodes: Hyperglycemic episodes: Feet problems: Blood Sugars averaging: eye exam within last year:  Elevated Cholesterol:  LDL almost at goal < 70  With CAD on statin Lab Results  Component Value Date   CHOL 162 12/18/2018   HDL 62.80 12/18/2018   LDLCALC 85 12/18/2018   LDLDIRECT 119.8 04/26/2009   TRIG 73.0 12/18/2018   CHOLHDL 3 12/18/2018  Using medications without problems: Muscle aches:  Diet compliance: good Exercise: walking Other complaints:  Hypertension:    Low in office today on lisinopril HCTZ BP Readings from Last 3 Encounters:  12/20/18 (!) 100/52  12/02/18 (!) 170/81  11/25/18 (!) 166/84  Using medication without problems or lightheadedness: none Chest pain with exertion:none Edema:none Short of breath: mild SOB with exertion  off and on. Average home BPs: Other issues:  Dementia is some better after starting prevagen.  She  Had nausea with galantamine, no longer on.  COVID 19 screen No recent travel or known exposure to COVID19 The patient denies respiratory symptoms  of COVID 19 at this time.  The importance of social distancing was discussed today.   Review of Systems  Constitutional: Negative for chills, fever and malaise/fatigue.  HENT: Negative for congestion and ear pain.   Eyes: Negative for pain and redness.  Respiratory: Positive for shortness of breath. Negative for cough.   Cardiovascular: Negative for chest pain,  palpitations and leg swelling.  Gastrointestinal: Negative for abdominal pain, blood in stool, constipation, diarrhea, nausea and vomiting.  Genitourinary: Negative for dysuria.  Musculoskeletal: Negative for falls and myalgias.  Skin: Negative for rash.  Neurological: Negative for dizziness.  Psychiatric/Behavioral: Positive for memory loss. Negative for depression. The patient is not nervous/anxious.       Past Medical History:  Diagnosis Date  . Allergic rhinitis   . Diabetes mellitus without complication (HCC)    diet controlled  . GERD (gastroesophageal reflux disease)   . Hyperlipidemia   . Hypertension   . Osteoarthritis   . Urinary incontinence     reports that she has never smoked. She has never used smokeless tobacco. She reports current alcohol use. She reports that she does not use drugs.   Current Outpatient Medications:  .  Apoaequorin (PREVAGEN PO), Take 1 tablet by mouth daily., Disp: , Rfl:  .  aspirin 81 MG tablet, Take 81 mg by mouth at bedtime. , Disp: , Rfl:  .  diltiazem (CARDIZEM CD) 120 MG 24 hr capsule, TAKE 1 CAPSULE BY MOUTH DAILY, Disp: 90 capsule, Rfl: 3 .  hydrochlorothiazide (HYDRODIURIL) 25 MG tablet, Take 25 mg by mouth daily., Disp: , Rfl:  .  losartan (COZAAR) 100 MG tablet, Take 100 mg by mouth daily., Disp: , Rfl:  .  metoprolol succinate (TOPROL-XL) 50 MG 24 hr tablet, TAKE 1 TABLET BY MOUTH DAILY, Disp: 90 tablet, Rfl: 2 .  montelukast (SINGULAIR) 10 MG tablet, Take 10 mg by mouth at bedtime., Disp: , Rfl:  .  NON FORMULARY, daily as needed. CBD Energy Capsule, Disp: , Rfl:  .  NON FORMULARY, Brain Support-2 tablets daily, Disp: , Rfl:  .  simvastatin (ZOCOR) 40 MG tablet, TAKE 1 TABLET BY MOUTH EACH NIGHT AT BEDTIME, Disp: 90 tablet, Rfl: 0 .  galantamine (RAZADYNE) 4 MG tablet, Take 1 tablet (4 mg total) by mouth 2 (two) times daily with a meal. (Patient not taking: Reported on 12/20/2018), Disp: 60 tablet, Rfl: 3    Observations/Objective: Blood pressure (!) 100/52, pulse (!) 47, temperature 98.2 F (36.8 C), temperature source Temporal, height 5' 2.25" (1.581 m), weight 145 lb 8 oz (66 kg), SpO2 97 %.    Wt Readings from Last 3 Encounters:  12/20/18 145 lb 8 oz (66 kg)  09/26/18 149 lb (67.6 kg)  07/19/18 157 lb 8 oz (71.4 kg)  Body mass index is 26.4 kg/m.   Physical Exam Constitutional:      General: She is not in acute distress.    Appearance: Normal appearance. She is well-developed. She is obese. She is not ill-appearing or toxic-appearing.  HENT:     Head: Normocephalic.     Right Ear: Hearing, tympanic membrane, ear canal and external ear normal.     Left Ear: Hearing, tympanic membrane, ear canal and external ear normal.     Nose: Nose normal.  Eyes:     General: Lids are normal. Lids are everted, no foreign bodies appreciated.     Conjunctiva/sclera: Conjunctivae normal.     Pupils: Pupils are equal, round, and reactive to light.  Neck:     Musculoskeletal: Normal range of motion and neck supple.     Thyroid: No thyroid mass or thyromegaly.     Vascular: No carotid bruit.     Trachea: Trachea normal.  Cardiovascular:     Rate and Rhythm: Normal rate and regular rhythm.     Heart sounds: Normal heart sounds, S1 normal and S2 normal. No murmur. No gallop.   Pulmonary:     Effort: Pulmonary effort is normal. No respiratory distress.     Breath sounds: Normal breath sounds. No wheezing, rhonchi or rales.  Abdominal:     General: Bowel sounds are normal. There is no distension or abdominal bruit.     Palpations: Abdomen is soft. There is no fluid wave or mass.     Tenderness: There is no abdominal tenderness. There is no guarding or rebound.     Hernia: No hernia is present.  Lymphadenopathy:     Cervical: No cervical adenopathy.  Skin:    General: Skin is warm and dry.     Findings: No rash.  Neurological:     Mental Status: She is alert.     Cranial Nerves: No cranial  nerve deficit.     Sensory: No sensory deficit.  Psychiatric:        Mood and Affect: Mood is not anxious or depressed.        Speech: Speech normal.        Behavior: Behavior normal. Behavior is cooperative.        Judgment: Judgment normal.     Diabetic foot exam: Normal inspection No skin breakdown No calluses  Normal DP pulses Normal sensation to light touch and monofilament Nails normal  Assessment and Plan The patient's preventative maintenance and recommended screening tests for an annual wellness exam were reviewed in full today. Brought up to date unless services declined.  Counselled on the importance of diet, exercise, and its role in overall health and mortality. The patient's FH and SH was reviewed, including their home life, tobacco status, and drug and alcohol status.   Vaccines: Uptodate with flu, shingles and pneumovax, prevnar  Due for td Mammo: 3D Had 4/18nml, no family history of breast cancer... Wishes to continue after every 2 year. DVE: pap and DVE not indicated. TAH. Sister with ovarian cancer  DEXA: Normal, 08/2016 repeat 5 year work on walking, Ca and Vit D Colon: 02/01/2005 Nml, Dr. Wynetta Emery, 12/2016 cologuard Nonsmoker      [name], MD

## 2018-12-20 NOTE — Patient Instructions (Addendum)
Use nasal saline DAILY or twice daily  If needed can also start back flonase. Set up mammogram on your own.  Call if referral to allergist is needed.  Call if shortness of breath is becoming bothersome and more frequent and we will check CT chest.

## 2018-12-20 NOTE — Assessment & Plan Note (Signed)
Risk factor reduction.  Possible causing mild SOB or at least contruibuting.

## 2018-12-20 NOTE — Assessment & Plan Note (Addendum)
Improved with prevagen per pt.  She has not tolerated galantamine or aricept.

## 2018-12-20 NOTE — Assessment & Plan Note (Signed)
Well controlled. Continue current medication.  

## 2018-12-20 NOTE — Assessment & Plan Note (Signed)
Diet controlled. Encouraged exercise, weight maintanence, healthy eating habits.

## 2018-12-20 NOTE — Assessment & Plan Note (Signed)
Mild CAd possibly contributing. Nml EF on cath in past. BNP unremarkable, no edema  Chronic nasal congestion contributing.  CXR neg in past.  Unremarkable labs.  Currently patient does not feel this is bothersome... does not wish to proceed with CT chest at this time.. will call if worsening.

## 2019-01-17 ENCOUNTER — Encounter: Payer: Self-pay | Admitting: Family Medicine

## 2019-01-17 ENCOUNTER — Ambulatory Visit: Payer: PPO

## 2019-01-17 ENCOUNTER — Telehealth: Payer: Self-pay | Admitting: Family Medicine

## 2019-01-17 ENCOUNTER — Telehealth: Payer: Self-pay

## 2019-01-17 ENCOUNTER — Other Ambulatory Visit: Payer: Self-pay

## 2019-01-17 ENCOUNTER — Ambulatory Visit (INDEPENDENT_AMBULATORY_CARE_PROVIDER_SITE_OTHER): Payer: PPO | Admitting: Family Medicine

## 2019-01-17 VITALS — BP 118/50 | HR 55 | Temp 97.9°F | Ht 62.25 in | Wt 145.0 lb

## 2019-01-17 DIAGNOSIS — R0602 Shortness of breath: Secondary | ICD-10-CM

## 2019-01-17 DIAGNOSIS — J309 Allergic rhinitis, unspecified: Secondary | ICD-10-CM

## 2019-01-17 NOTE — Assessment & Plan Note (Signed)
Mild improvement per pt.. now on flonsae, saline and allegra. Consider referral to allergist.

## 2019-01-17 NOTE — Telephone Encounter (Signed)
Pt said she has bad allergies; today has constant mucus in throat; long standing problem;pt said she does not have cough. Pt has SOB on and off. Pt thinks she needs something for allergies that is different from [place] and allegra. Pt has no covid symptoms except SOB, no travel and no known exposure to + covid. Pt scheduled virtual appt today at 3:20 with Dr [name]. UC & ED precautions given and pt voiced understanding. Pt will have vital signs ready when Butch Penny CMA calls pt.

## 2019-01-17 NOTE — Telephone Encounter (Signed)
Patient did a virtual visit today.  Patient is scheduled for a chest x-ray and she's wondering if she needs an x-ray of her nose done to see if that's part of the problem that's making her short of breath.

## 2019-01-17 NOTE — Telephone Encounter (Signed)
Noted  

## 2019-01-17 NOTE — Telephone Encounter (Signed)
No CT of nose/sinuses at this time... we as looking into shortness of breath with a chest CT.

## 2019-01-17 NOTE — Assessment & Plan Note (Signed)
Worsening as opposed to improving along with improvement in allergies. ENT felt SOB not due to nasal congestion as pt thought likely.  Will eval with Chest CT, CXR was unremarkable. Nonsmoker.  not consistent with COPD/asthma.. no wheeze.   She has had cardiac eval with mild CAD.Marland Kitchen may be component of DOE. Nml cbc, BNP not significantly elevated.  ECHO to be considered if  Chest CT unremarkable.  TSH, cbc nml

## 2019-01-17 NOTE — Progress Notes (Signed)
VIRTUAL VISIT Due to national recommendations of social distancing due to [place] 19, a virtual visit is felt to be most appropriate for this patient at this time.   I connected with the patient on 01/17/19 at  3:20 PM EDT by virtual telehealth platform and verified that I am speaking with the correct person using two identifiers.   Interactive audio and video telecommunications were attempted between this provider and patient, however failed, due to patient having technical difficulties OR patient did not have access to video capability.  We continued and completed visit with audio only.   I discussed the limitations, risks, security and privacy concerns of performing an evaluation and management service by  virtual telehealth platform and the availability of in person appointments. I also discussed with the patient that there may be a patient responsible charge related to this service. The patient expressed understanding and agreed to proceed.  Patient location: Home Provider Location: [facility] Participants: [name] and [name]   Chief Complaint  Patient presents with  . Shortness of Breath    History of Present Illness: 83 year old female presents with allergies causing congestion contributing to SOB.  She reports constant mucus in throat; long standing problem;pt said she does not have cough. Pt has SOB on and off 5 months. Pt thinks she needs something for allergies that is different from [place] and allegra.  ( in past tried Xyzal and loratadine). Upper back pain when doing more around the house.. feels short of breath at the same time. Ongoing in last 3-4 months.   SOB at rest and with exertion. gradaully worsening. No chest pain or pressure.  She gets worn out going from mailbox and back   At last OV  1 month ago at last OV for similar issues.Marland KitchenMarland KitchenSaw ENT.. felt nasal issues not cause of SOB. Does still have a lot of nasal congestion despite nasal saline ands  flonase and allegra.  At rest.. better not worse with exertion. Neg eval so far with CXR, and labs Most consistent with anxiety symptoms... Recommend stress reduction relaxation. If not improving consider treatment of mood nml CBC, slightly elevated BNP 121, nml TSH Last year heart catheterization: nml LV function, mild CAD.Marland Kitchen medical management recommended.  No cough, no fever.  No swelling in ankles  COVID 19 screen No recent travel or known exposure to [place] The patient denies respiratory symptoms of COVID 19 at this time.  The importance of social distancing was discussed today.   Review of Systems  Constitutional: Negative for chills and fever.  HENT: Positive for congestion. Negative for ear discharge, ear pain and sinus pain.   Eyes: Negative for pain and redness.  Respiratory: Positive for sputum production and shortness of breath. Negative for cough.   Cardiovascular: Negative for chest pain, palpitations and leg swelling.  Gastrointestinal: Negative for abdominal pain, blood in stool, constipation, diarrhea, nausea and vomiting.  Genitourinary: Negative for dysuria.  Musculoskeletal: Negative for falls and myalgias.  Skin: Negative for rash.  Neurological: Negative for dizziness.  Psychiatric/Behavioral: Negative for depression. The patient is not nervous/anxious.       Past Medical History:  Diagnosis Date  . Allergic rhinitis   . Diabetes mellitus without complication (HCC)    diet controlled  . GERD (gastroesophageal reflux disease)   . Hyperlipidemia   . Hypertension   . Osteoarthritis   . Urinary incontinence     reports that she has never smoked. She has never used smokeless  tobacco. She reports current alcohol use. She reports that she does not use drugs.   Current Outpatient Medications:  .  Apoaequorin (PREVAGEN PO), Take 1 tablet by mouth daily., Disp: , Rfl:  .  aspirin 81 MG tablet, Take 81 mg by mouth at bedtime. , Disp: , Rfl:  .  diltiazem  (CARDIZEM CD) 120 MG 24 hr capsule, TAKE 1 CAPSULE BY MOUTH DAILY, Disp: 90 capsule, Rfl: 3 .  hydrochlorothiazide (HYDRODIURIL) 25 MG tablet, Take 25 mg by mouth daily., Disp: , Rfl:  .  losartan (COZAAR) 100 MG tablet, Take 100 mg by mouth daily., Disp: , Rfl:  .  metoprolol succinate (TOPROL-XL) 50 MG 24 hr tablet, TAKE 1 TABLET BY MOUTH DAILY, Disp: 90 tablet, Rfl: 2 .  montelukast (SINGULAIR) 10 MG tablet, Take 10 mg by mouth at bedtime., Disp: , Rfl:  .  NON FORMULARY, daily as needed. CBD Energy Capsule, Disp: , Rfl:  .  NON FORMULARY, Brain Support-2 tablets daily, Disp: , Rfl:  .  simvastatin (ZOCOR) 40 MG tablet, TAKE 1 TABLET BY MOUTH EACH NIGHT AT BEDTIME, Disp: 90 tablet, Rfl: 0   Observations/Objective: Blood pressure (!) 118/50, pulse (!) 55, temperature 97.9 F (36.6 C), temperature source Oral, height 5' 2.25" (1.581 m), weight 145 lb (65.8 kg).  Physical Exam  Physical Exam Constitutional:      General: The patient is not in acute distress. Pulmonary:     Effort: Pulmonary effort is normal. No respiratory distress.  Neurological:     Mental Status: The patient is alert and oriented to person, place, and time.  Psychiatric:        Mood and Affect: Mood normal.        Behavior: Behavior normal.   Assessment and Plan SOB (shortness of breath) Worsening as opposed to improving along with improvement in allergies. ENT felt SOB not due to nasal congestion as pt thought likely.  Will eval with Chest CT, CXR was unremarkable. Nonsmoker.  not consistent with COPD/asthma.. no wheeze.   She has had cardiac eval with mild CAD.Marland Kitchen may be component of DOE. Nml cbc, BNP not significantly elevated.  ECHO to be considered if  Chest CT unremarkable.  TSH, cbc nml    Allergic rhinitis Mild improvement per pt.. now on flonsae, saline and allegra. Consider referral to allergist.      I discussed the assessment and treatment plan with the patient. The patient was provided an  opportunity to ask questions and all were answered. The patient agreed with the plan and demonstrated an understanding of the instructions.   The patient was advised to call back or seek an in-person evaluation if the symptoms worsen or if the condition fails to improve as anticipated.  Total visit time 15 minutes, > 50% spent counseling and cordinating patients care.     [name], MD

## 2019-01-21 NOTE — Telephone Encounter (Signed)
Left message for Mrs. Villon to return my call.

## 2019-01-21 NOTE — Telephone Encounter (Signed)
Janet Chapman notified as instructed by telephone.

## 2019-01-21 NOTE — Telephone Encounter (Signed)
Patient returned your call from earlier this morning

## 2019-01-24 ENCOUNTER — Ambulatory Visit
Admission: RE | Admit: 2019-01-24 | Discharge: 2019-01-24 | Disposition: A | Discharge status: Home / Self Care | Payer: PPO | Source: Ambulatory Visit | Attending: Family Medicine | Admitting: Family Medicine

## 2019-01-24 DIAGNOSIS — R0602 Shortness of breath: Secondary | ICD-10-CM

## 2019-01-24 DIAGNOSIS — J984 Other disorders of lung: Secondary | ICD-10-CM | POA: Diagnosis not present

## 2019-01-28 ENCOUNTER — Other Ambulatory Visit: Payer: Self-pay | Admitting: Family Medicine

## 2019-01-28 DIAGNOSIS — R0600 Dyspnea, unspecified: Secondary | ICD-10-CM

## 2019-01-28 DIAGNOSIS — R0609 Other forms of dyspnea: Secondary | ICD-10-CM

## 2019-02-05 ENCOUNTER — Other Ambulatory Visit: Payer: Self-pay | Admitting: Family Medicine

## 2019-02-06 ENCOUNTER — Other Ambulatory Visit: Payer: Self-pay | Admitting: Family Medicine

## 2019-02-07 ENCOUNTER — Other Ambulatory Visit: Payer: Self-pay

## 2019-02-07 ENCOUNTER — Ambulatory Visit (INDEPENDENT_AMBULATORY_CARE_PROVIDER_SITE_OTHER): Payer: PPO

## 2019-02-07 DIAGNOSIS — R0609 Other forms of dyspnea: Secondary | ICD-10-CM

## 2019-02-07 DIAGNOSIS — R0600 Dyspnea, unspecified: Secondary | ICD-10-CM

## 2019-02-13 ENCOUNTER — Telehealth: Payer: Self-pay | Admitting: Family Medicine

## 2019-02-13 DIAGNOSIS — J329 Chronic sinusitis, unspecified: Secondary | ICD-10-CM

## 2019-02-13 NOTE — Telephone Encounter (Signed)
Yes.. reviewed and noted on 9/28... but maybe not forwarded correctly  Heart squeeze is normal, mild dilation of atria .. not clearly cause of shortness of breath.  If you are still having nasal congestion as primary symptoms of dyspnea... we can move forward with CT of sinuses.

## 2019-02-13 NOTE — Telephone Encounter (Signed)
Patient called.  Patient wants to know if the results of the Echocardiogram have been received by Dr.Bedsole.

## 2019-02-13 NOTE — Telephone Encounter (Signed)
Ordered

## 2019-02-13 NOTE — Telephone Encounter (Signed)
Mrs. Swofford notified as instructed by telephone.  She would like to move forward with the CT on her sinuses.

## 2019-02-20 ENCOUNTER — Ambulatory Visit
Admission: RE | Admit: 2019-02-20 | Discharge: 2019-02-20 | Disposition: A | Discharge status: Home / Self Care | Payer: PPO | Source: Ambulatory Visit | Attending: Family Medicine | Admitting: Family Medicine

## 2019-02-20 DIAGNOSIS — J329 Chronic sinusitis, unspecified: Secondary | ICD-10-CM | POA: Diagnosis not present

## 2019-02-28 ENCOUNTER — Telehealth: Payer: Self-pay | Admitting: Family Medicine

## 2019-02-28 DIAGNOSIS — H43813 Vitreous degeneration, bilateral: Secondary | ICD-10-CM | POA: Diagnosis not present

## 2019-02-28 DIAGNOSIS — E119 Type 2 diabetes mellitus without complications: Secondary | ICD-10-CM | POA: Diagnosis not present

## 2019-02-28 DIAGNOSIS — H2513 Age-related nuclear cataract, bilateral: Secondary | ICD-10-CM | POA: Diagnosis not present

## 2019-02-28 DIAGNOSIS — H17822 Peripheral opacity of cornea, left eye: Secondary | ICD-10-CM | POA: Diagnosis not present

## 2019-02-28 DIAGNOSIS — R0982 Postnasal drip: Secondary | ICD-10-CM

## 2019-02-28 LAB — HM DIABETES EYE EXAM

## 2019-02-28 NOTE — Telephone Encounter (Signed)
I have called patient x 4.  I keep getting message that my call can not go through and to please try your call later.

## 2019-02-28 NOTE — Telephone Encounter (Signed)
Attempted to reach pt... phone out of service.  Husband reached at work.. requested a phone call  To [phone].  Again no answer.  Will try again to reach pt early next week.

## 2019-02-28 NOTE — Telephone Encounter (Signed)
Patient called back and she said she has several questions about Donna's message.  Patient's requesting a call back and said she'll be home all afternoon.  Patient can be reached at [phone].

## 2019-02-28 NOTE — Telephone Encounter (Signed)
Left voicemail as requested:  Her scan showed there is minimal inflammation and thickness of the lining of sinuses... no chronic sinusitis. There is a possible cyst or polyp in right sinus but Dr. [name] is not sure if this is large enough to cause your symptoms.Marland KitchenMarland KitchenShe doubts it.  I advised that Dr. [name] thought she may want to follow up with an ENT MD and ask them to review the CT scan to see if any treatment of cyst is  Recommended.  I ask that she call us back if she would like Dr. [name] to put in referral for ENT.

## 2019-02-28 NOTE — Telephone Encounter (Signed)
Patient called about results. She stated she had a scan done last week of her head and she has not heard back about the results. She would like to know if you have these results and if someone could call her  Patient stated if she does not answer to please leave it on her voicemail

## 2019-03-03 NOTE — Telephone Encounter (Signed)
[place] Night - Client Nonclinical Telephone Record AccessNurse Client [place] Night - Client Client Site [place] - Night Physician [name] - MD Contact Type Call Who Is Calling Patient / Member / Family / Caregiver Caller Name [place] Phone Number [phone] Call Type Message Only Information Provided Reason for Call Returning a Call from the Office Initial [place] states that she missed a call from the office. Returning their call. Call Closed By: Bryson Ha Transaction Date/Time: 02/28/2019 5:01:25 PM (ET)

## 2019-03-03 NOTE — Telephone Encounter (Signed)
Left message for Janet Chapman to return my call.

## 2019-03-03 NOTE — Telephone Encounter (Signed)
Spoke with [name] and discussed Sinus CT results.  She states she was seen by ENT back in July and he mentioned that she might want to go be seen by an allergist.  So at this point Mrs. Janet Chapman would prefer to be referred to an allergist vs.going back to the ENT.  Will send message back to Dr. [name] to order allergist referral.

## 2019-03-04 NOTE — Telephone Encounter (Signed)
Referral sent 

## 2019-03-04 NOTE — Addendum Note (Signed)
Addended by: [name] E on: 03/04/2019 02:11 PM   Modules accepted: Orders

## 2019-03-05 ENCOUNTER — Encounter: Payer: Self-pay | Admitting: Family Medicine

## 2019-03-26 ENCOUNTER — Other Ambulatory Visit: Payer: Self-pay

## 2019-03-26 ENCOUNTER — Encounter: Payer: Self-pay | Admitting: Allergy

## 2019-03-26 ENCOUNTER — Ambulatory Visit: Payer: PPO | Admitting: Allergy

## 2019-03-26 VITALS — BP 160/64 | HR 61 | Temp 97.7°F | Resp 16 | Ht 60.63 in | Wt 148.2 lb

## 2019-03-26 DIAGNOSIS — J3089 Other allergic rhinitis: Secondary | ICD-10-CM

## 2019-03-26 DIAGNOSIS — R0602 Shortness of breath: Secondary | ICD-10-CM

## 2019-03-26 MED ORDER — AZELASTINE HCL 0.15 % NA SOLN: NASAL | 5 refills | Status: DC

## 2019-03-26 NOTE — Assessment & Plan Note (Signed)
Some shortness of breath at times. No wheezing. No prior asthma/copd diagnosis.   Today's spirometry was normal.  Monitor symptoms. If worsening or persistent, follow up with PCP regarding this.

## 2019-03-26 NOTE — Assessment & Plan Note (Addendum)
Rhinitis symptoms with main complaint of postnasal drip since February 2020.  Tried over-the-counter antihistamines, Flonase and montelukast with some benefit.  CT sinus in October 2020 showed minimal mucosal thickening and possibly a retention cyst/polyp in the right maxillary sinus.  She was evaluated by ENT. Denies heartburn/reflux symptoms.   Today's skin testing showed: Positive to molds, dog and dust mites.  Start environmental control measures.  Start azelastine nasal spray 1-2 sprays per nostril up to twice a day for sinus drainage. If this is not effective then will try ipratropium nasal spray next.   May use fluticasone 1 spray per nostril twice a day as needed for nasal congestion.   Nasal saline spray (i.e., Simply Saline) or nasal saline lavage (i.e., NeilMed) is recommended as needed and prior to medicated nasal sprays.  May use over the counter antihistamines such as Zyrtec (cetirizine), Claritin (loratadine), Allegra (fexofenadine), or Xyzal (levocetirizine) daily as needed.  Continue montelukast 10mg  daily for now.

## 2019-03-26 NOTE — Progress Notes (Signed)
New Patient Note  RE: [name] MRN: [id number] DOB: 12/27/1935 Date of Office Visit: 03/26/2019  Referring provider: [name], MD Primary care provider: [name], MD  Chief Complaint: nasal drainage (started in February )  History of Present Illness: I had the pleasure of seeing Janet Chapman for initial evaluation at the [facility] on 03/26/2019. She is a 83 y.o. female, who is referred here by [name], MD for the evaluation of post nasal drip.   She reports symptoms of PND, left sided nasal congestion, sneezing, throat clearing. Symptoms have been going on since February 2020. The symptoms are present all year around with worsening in the spring and summer. Otherwise symptoms can occur anytime of the day. Anosmia: no. Headache: sometimes. She has used Xyzal, loratadine with no benefit. She also tried Flonase, saline nasal spray, Singulair and allegra with some improvement in symptoms. Sinus infections: no. Previous work up includes: none. Also noted some shortness of breath but no wheezing. No prior inhaler use.  Previous ENT evaluation: yes in July 2020.  Previous sinus imaging: no. Last eye exam: yes recently. History of nasal polyps: no. History of reflux: no  CT sinus 02/20/2019: IMPRESSION: Minimal mucosal thickening inferior aspect of the right maxillary sinus. Mucous retention cyst or polyp is also seen in the right maxillary sinus.  Assessment and Plan: [name] is a 83 y.o. female with: Other allergic rhinitis Rhinitis symptoms with main complaint of postnasal drip since February 2020.  Tried over-the-counter antihistamines, Flonase and montelukast with some benefit.  CT sinus in October 2020 showed minimal mucosal thickening and possibly a retention cyst/polyp in the right maxillary sinus.  She was evaluated by ENT. Denies heartburn/reflux symptoms.   Today's skin testing showed: Positive to molds, dog and dust mites.  Start  environmental control measures.  Start azelastine nasal spray 1-2 sprays per nostril up to twice a day for sinus drainage. If this is not effective then will try ipratropium nasal spray next.   May use fluticasone 1 spray per nostril twice a day as needed for nasal congestion.   Nasal saline spray (i.e., Simply Saline) or nasal saline lavage (i.e., NeilMed) is recommended as needed and prior to medicated nasal sprays.  May use over the counter antihistamines such as Zyrtec (cetirizine), Claritin (loratadine), Allegra (fexofenadine), or Xyzal (levocetirizine) daily as needed.  Continue montelukast 10mg  daily for now.   Shortness of breath Some shortness of breath at times. No wheezing. No prior asthma/copd diagnosis.   Today's spirometry was normal.  Monitor symptoms. If worsening or persistent, follow up with PCP regarding this.   Return in about 4 weeks (around 04/23/2019).  Meds ordered this encounter  Medications  . Azelastine HCl 0.15 % SOLN    Sig: May use 1-2 sprays per nostril up to twice a day as needed for sinus drainage.    Dispense:  30 mL    Refill:  5   Other allergy screening: Asthma: no Food allergy: no Medication allergy:   Codeine - nausea Hymenoptera allergy: no Urticaria: no Eczema:no History of recurrent infections suggestive of immunodeficency: no  Diagnostics: Spirometry:  Tracings reviewed. Her effort: Good reproducible efforts. FVC: 1.80L FEV1: 1.41L, 93% predicted FEV1/FVC ratio: 78% Interpretation: Spirometry consistent with normal pattern.  Please see scanned spirometry results for details.  Skin Testing: Environmental allergy panel and select foods. Positive test to: molds, dog and dust mites. Negative test to: common foods and pollens.  Results  discussed with patient/family. Airborne Adult Perc - 03/26/19 1001    Time Antigen Placed  1001    Allergen Manufacturer  Lavella Hammock    Location  Back    Number of Test  59    Panel 1  Select    1.  Control-Buffer 50% Glycerol  Negative    2. Control-Histamine 1 mg/ml  2+    3. Albumin saline  Negative    4. [place]  Negative    5. [place]  Negative    6. Johnson  Negative    7. Dover Blue  Negative    8. Meadow Fescue  Negative    9. Perennial Rye  Negative    10. Sweet Vernal  Negative    11. Timothy  Negative    12. Cocklebur  Negative    13. Burweed Marshelder  Negative    14. Ragweed, short  Negative    15. Ragweed, Giant  Negative    16. Plantain,  English  Negative    17. Lamb's Quarters  Negative    18. Sheep Sorrell  Negative    19. Rough Pigweed  Negative    20. Marsh Elder, Rough  Negative    21. Mugwort, Common  Negative    22. Ash mix  Negative    23. Birch mix  Negative    24. Beech American  Negative    25. Box, Elder  Negative    26. Cedar, red  Negative    27. Cottonwood, [place]  Negative    28. Elm mix  Negative    29. Hickory mix  Negative    30. Maple mix  Negative    31. Oak, [place] mix  Negative    32. Pecan Pollen  Negative    33. Pine mix  Negative    34. Sycamore Eastern  Negative    35. [place], Black Pollen  Negative    36. Alternaria alternata  Negative    37. Cladosporium Herbarum  Negative    38. Aspergillus mix  Negative    39. Penicillium mix  Negative    40. Bipolaris sorokiniana (Helminthosporium)  Negative    41. Drechslera spicifera (Curvularia)  Negative    42. Mucor plumbeus  Negative    43. Fusarium moniliforme  2+    44. Aureobasidium pullulans (pullulara)  Negative    45. Rhizopus oryzae  Negative    46. Botrytis cinera  Negative    47. Epicoccum nigrum  Negative    48. Phoma betae  Negative    49. Candida Albicans  Negative    50. Trichophyton mentagrophytes  3+    51. Mite, D Farinae  5,000 AU/ml  Negative    52. Mite, D Pteronyssinus  5,000 AU/ml  Negative    53. Cat Hair 10,000 BAU/ml  Negative    54.  Dog Epithelia  2+    55. Mixed Feathers  Negative    56. Horse Epithelia  Negative    57. Cockroach, German   Negative    58. Mouse  Negative    59. Tobacco Leaf  Negative     Food Perc - 03/26/19 1001    Time Antigen Placed  1002    Allergen Manufacturer  Lavella Hammock    Location  Back    Number of allergen test  10    Food  Select    1. Peanut  Negative    2. Soybean food  Negative    3. Wheat, whole  Negative    4. Sesame  Negative    5. Milk, cow  Negative    6. Egg White, chicken  Negative    7. Casein  Negative    8. Shellfish mix  Negative    9. Fish mix  Negative    10. Cashew  Negative     Intradermal - 03/26/19 1000    Time Antigen Placed  1038    Allergen Manufacturer  Greer    Location  Arm    Number of Test  13    Control  Negative    Guatem[place]  Negative    Johnson  Negative    7 Grass  Negative    Ragweed mix  Negative    Weed mix  Negative    Tree mix  Negative    Mold 1  Negative    Mold 2  3+    Mold 3  3+    Cat  Negative    Cockroach  Negative    Mite mix  2+       Past Medical History: Patient Active Problem List   Diagnosis Date Noted  . Nasal septal deviation 11/26/2018  . Upper back pain 09/26/2018  . Sinus drainage 07/01/2018  . Chronic intractable headache 01/03/2018  . Nostril sore 12/13/2017  . Mouth sore 12/13/2017  . Medicare annual wellness visit, subsequent 12/04/2017  . Coronary artery disease involving native coronary artery of native heart with unstable angina pectoris ([place])   . Shortness of breath 11/02/2017  . Bradycardia 06/12/2017  . Palpitations 06/12/2017  . Spider varicose veins 07/22/2015  . Advanced care planning/counseling discussion 07/08/2015  . 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 Chronic radicular low back pain 01/02/2014  . Atrophic vaginitis 09/26/2013  . Pulmonary nodule, right 05/23/2013  . Tinea pedis 12/20/2012  . INSOMNIA, CHRONIC 01/14/2010  . Diabetes mellitus, controlled ([place]) 11/11/2008  . MAMMOGRAM, ABNORMAL, LEFT 11/09/2008  . Chronic idiopathic constipation 09/11/2007  . OBESITY  05/31/2007  . Other allergic rhinitis 05/31/2007  . GERD 05/31/2007  . OSTEOARTHRITIS 05/31/2007  . URINARY INCONTINENCE 05/31/2007  . High cholesterol 04/19/2007  . Essential hypertension, benign 04/19/2007  . ACTINIC KERATOSIS 02/01/2007   Past Medical History:  Diagnosis Date  . Allergic rhinitis   . Diabetes mellitus without complication (HCC)    diet controlled  . GERD (gastroesophageal reflux disease)   . Hyperlipidemia   . Hypertension   . Osteoarthritis   . Urinary incontinence    Past Surgical History: Past Surgical History:  Procedure Laterality Date  . ABDOMINAL HYSTERECTOMY  1993   TA  . CARDIOVASCULAR STRESS TEST  2004   H. Smith, cardiolite-normal  . CHOLECYSTECTOMY  [phone]  . COLONOSCOPY    . LEFT HEART CATH AND CORONARY ANGIOGRAPHY N/A 11/06/2017   Procedure: LEFT HEART CATH AND CORONARY ANGIOGRAPHY;  Surgeon: Burnell Blanks, MD;  Location: [facility];  Service: Cardiovascular;  Laterality: N/A;   Medication List:  Current Outpatient Medications  Medication Sig Dispense Refill  . Apoaequorin (PREVAGEN PO) Take 1 tablet by mouth daily.    Marland Kitchen aspirin 81 MG tablet Take 81 mg by mouth at bedtime.     Marland Kitchen diltiazem (CARDIZEM CD) 120 MG 24 hr capsule TAKE 1 CAPSULE BY MOUTH DAILY 90 capsule 3  . hydrochlorothiazide (HYDRODIURIL) 25 MG tablet Take 25 mg by mouth daily.    Marland Kitchen levocetirizine (XYZAL) 5 MG tablet Take 5 mg by mouth every evening.    Marland Kitchen  losartan (COZAAR) 100 MG tablet Take 100 mg by mouth daily.    . metoprolol succinate (TOPROL-XL) 50 MG 24 hr tablet TAKE 1 TABLET BY MOUTH DAILY (Patient taking differently: 100 mg. ) 90 tablet 2  . montelukast (SINGULAIR) 10 MG tablet TAKE 1 TABLET BY MOUTH AT BEDTIME 30 tablet 3  . Multiple Vitamins-Minerals (VITAMIN D3 COMPLETE PO) Take by mouth.    . NON FORMULARY daily as needed. CBD Energy Capsule    . NON FORMULARY Brain Support-2 tablets daily    . simvastatin (ZOCOR) 40 MG tablet TAKE 1 TABLET BY  MOUTH EACH NIGHT AT BEDTIME 90 tablet 3  . Azelastine HCl 0.15 % SOLN May use 1-2 sprays per nostril up to twice a day as needed for sinus drainage. 30 mL 5   No current facility-administered medications for this visit.    Allergies: Allergies  Allergen Reactions  . Codeine Nausea Only   Social History: Social History   Socioeconomic History  . Marital status: Married    Spouse name: Not on file  . Number of children: 0  . Years of education: Not on file  . Highest education level: Not on file  Occupational History  . Occupation: retired  Scientific laboratory technician  . Financial resource strain: Not on file  . Food insecurity    Worry: Not on file    Inability: Not on file  . Transportation needs    Medical: Not on file    Non-medical: Not on file  Tobacco Use  . Smoking status: Former Research scientist (life sciences)  . Smokeless tobacco: Never Used  . Tobacco comment: in college  Substance and Sexual Activity  . Alcohol use: Yes    Comment: 1 glass of wine per month  . Drug use: No  . Sexual activity: Never  Lifestyle  . Physical activity    Days per week: Not on file    Minutes per session: Not on file  . Stress: Not on file  Relationships  . Social Herbalist on phone: Not on file    Gets together: Not on file    Attends religious service: Not on file    Active member of club or organization: Not on file    Attends meetings of clubs or organizations: Not on file    Relationship status: Not on file  Other Topics Concern  . Not on file  Social History Narrative    Regular exercise: yes, Curves 2x a week   Married 48 + years    Diet: fruit and veggies, no FF, some water, artificial sweetener   HCPOA: [name], has living will, full code ( reviewed 2014)            Lives in a house which is 81 year old. Smoking: denies Occupation: retired  Programme researcher, broadcasting/film/video HistoryFreight forwarder in the house: no Charity fundraiser in the family room: no Carpet in the bedroom: yes Heating: gas  Cooling: central Pet: no  Family History: Family History  Problem Relation Age of Onset  . Heart attack Father 59  . Dementia Mother   . Stroke Mother 24  . Ovarian cancer Sister 54  . Diabetes Other        Maternal side  . Ovarian cancer Other        Grandmother  . Ovarian cancer Other        aunt  . Colon cancer Neg Hx    Problem  Relation Asthma                                   no Eczema                                no Food allergy                          no Allergic rhino conjunctivitis     no  Review of Systems  Constitutional: Negative for appetite change, chills, fever and unexpected weight change.  HENT: Positive for congestion and postnasal drip. Negative for sore throat.   Eyes: Negative for itching.  Respiratory: Positive for cough and shortness of breath. Negative for chest tightness and wheezing.   Cardiovascular: Negative for chest pain.  Gastrointestinal: Negative for abdominal pain.  Genitourinary: Negative for difficulty urinating.  Skin: Negative for rash.  Allergic/Immunologic: Positive for environmental allergies. Negative for food allergies.  Neurological: Negative for headaches.   Objective: BP (!) 160/64 (BP Location: Left Arm, Patient Position: Sitting, Cuff Size: Normal)   Pulse 61   Temp 97.7 F (36.5 C) (Temporal)   Resp 16   Ht 5' 0.63" (1.54 m)   Wt 148 lb 3.2 oz (67.2 kg)   SpO2 95%   BMI 28.34 kg/m  Body mass index is 28.34 kg/m. Physical Exam  Constitutional: She is oriented to person, place, and time. She appears well-developed and well-nourished.  HENT:  Head: Normocephalic and atraumatic.  Right Ear: External ear normal.  Left Ear: External ear normal.  Nose: Nose normal.  Mouth/Throat: Oropharynx is clear and moist.  Eyes: Conjunctivae and EOM are normal.  Neck: Neck supple.  Cardiovascular: Normal rate, regular rhythm and normal heart sounds. Exam reveals no gallop and no friction rub.   No murmur heard. Pulmonary/Chest: Effort normal and breath sounds normal. She has no wheezes. She has no rales.  Abdominal: Soft.  Neurological: She is alert and oriented to person, place, and time.  Skin: Skin is warm. No rash noted.  Psychiatric: She has a normal mood and affect. Her behavior is normal.  Nursing note and vitals reviewed.  The plan was reviewed with the patient/family, and all questions/concerned were addressed.  It was my pleasure to see Meoshia today and participate in her care. Please feel free to contact me with any questions or concerns.  Sincerely,  [name], DO Allergy & Immunology  [facility] office: [phone] [facility] office: [place] office: [phone]

## 2019-03-26 NOTE — Patient Instructions (Addendum)
Recheck BP  Today's skin testing showed: Positive to molds, dog and dust mites.  Start environmental control measures as below.  Your breathing test looked normal today.   Start azelastine nasal spray 1-2 sprays per nostril up to twice a day for sinus drainage.  May use fluticasone 1 spray per nostril twice a day as needed for nasal congestion.   Nasal saline spray (i.e., Simply Saline) or nasal saline lavage (i.e., NeilMed) is recommended as needed and prior to medicated nasal sprays.  May use over the counter antihistamines such as Zyrtec (cetirizine), Claritin (loratadine), Allegra (fexofenadine), or Xyzal (levocetirizine) daily as needed.  Continue montelukast 10mg  daily for now.   Follow up in 1 month or sooner if needed.   Control of House Dust Mite Allergen . Dust mite allergens are a common trigger of allergy and asthma symptoms. While they can be found throughout the house, these microscopic creatures thrive in warm, humid environments such as bedding, upholstered furniture and carpeting. . Because so much time is spent in the bedroom, it is essential to reduce mite levels there.  . Encase pillows, mattresses, and box springs in special allergen-proof fabric covers or airtight, zippered plastic covers.  . Bedding should be washed weekly in hot water (130 F) and dried in a hot dryer. Allergen-proof covers are available for comforters and pillows that can't be regularly washed.  Wendee Copp the allergy-proof covers every few months. Minimize clutter in the bedroom. Keep pets out of the bedroom.  Marland Kitchen Keep humidity less than 50% by using a dehumidifier or air conditioning. You can buy a humidity measuring device called a hygrometer to monitor this.  . If possible, replace carpets with hardwood, linoleum, or washable area rugs. If that's not possible, vacuum frequently with a vacuum that has a HEPA filter. . Remove all upholstered furniture and non-washable window drapes from the  bedroom. . Remove all non-washable stuffed toys from the bedroom.  Wash stuffed toys weekly. Mold Control . Mold and fungi can grow on a variety of surfaces provided certain temperature and moisture conditions exist.  . Outdoor molds grow on plants, decaying vegetation and soil. The major outdoor mold, Alternaria and Cladosporium, are found in very high numbers during hot and dry conditions. Generally, a late summer - fall peak is seen for common outdoor fungal spores. Rain will temporarily lower outdoor mold spore count, but counts rise rapidly when the rainy period ends. . The most important indoor molds are Aspergillus and Penicillium. Dark, humid and poorly ventilated basements are ideal sites for mold growth. The next most common sites of mold growth are the bathroom and the kitchen. Outdoor (Seasonal) Mold Control . Use air conditioning and keep windows closed. . Avoid exposure to decaying vegetation. Marland Kitchen Avoid leaf raking. . Avoid grain handling. . Consider wearing a face mask if working in moldy areas.  Indoor (Perennial) Mold Control  . Maintain humidity below 50%. . Get rid of mold growth on hard surfaces with water, detergent and, if necessary, 5% bleach (do not mix with other cleaners). Then dry the area completely. If mold covers an area more than 10 square feet, consider hiring an indoor environmental professional. . For clothing, washing with soap and water is best. If moldy items cannot be cleaned and dried, throw them away. . Remove sources e.g. contaminated carpets. . Repair and seal leaking roofs or pipes. Using dehumidifiers in damp basements may be helpful, but empty the water and clean units regularly to prevent mildew from  forming. All rooms, especially basements, bathrooms and kitchens, require ventilation and cleaning to deter mold and mildew growth. Avoid carpeting on concrete or damp floors, and storing items in damp areas. Pet Allergen Avoidance: . Contrary to popular  opinion, there are no "hypoallergenic" breeds of dogs or cats. That is because people are not allergic to an animal's hair, but to an allergen found in the animal's saliva, dander (dead skin flakes) or urine. Pet allergy symptoms typically occur within minutes. For some people, symptoms can build up and become most severe 8 to 12 hours after contact with the animal. People with severe allergies can experience reactions in public places if dander has been transported on the pet owners' clothing. Marland Kitchen Keeping an animal outdoors is only a partial solution, since homes with pets in the yard still have higher concentrations of animal allergens. . Before getting a pet, ask your allergist to determine if you are allergic to animals. If your pet is already considered part of your family, try to minimize contact and keep the pet out of the bedroom and other rooms where you spend a great deal of time. . As with dust mites, vacuum carpets often or replace carpet with a hardwood floor, tile or linoleum. . High-efficiency particulate air (HEPA) cleaners can reduce allergen levels over time. . While dander and saliva are the source of cat and dog allergens, urine is the source of allergens from rabbits, hamsters, mice and [place] pigs; so ask a non-allergic family member to clean the animal's cage. . If you have a pet allergy, talk to your allergist about the potential for allergy immunotherapy (allergy shots). This strategy can often provide long-term relief.

## 2019-04-03 ENCOUNTER — Ambulatory Visit (INDEPENDENT_AMBULATORY_CARE_PROVIDER_SITE_OTHER): Payer: PPO | Admitting: Cardiology

## 2019-04-03 ENCOUNTER — Other Ambulatory Visit: Payer: Self-pay

## 2019-04-03 ENCOUNTER — Encounter: Payer: Self-pay | Admitting: Cardiology

## 2019-04-03 VITALS — BP 136/62 | HR 72 | Ht 62.0 in | Wt 145.0 lb

## 2019-04-03 DIAGNOSIS — I10 Essential (primary) hypertension: Secondary | ICD-10-CM

## 2019-04-03 DIAGNOSIS — I25110 Atherosclerotic heart disease of native coronary artery with unstable angina pectoris: Secondary | ICD-10-CM | POA: Diagnosis not present

## 2019-04-03 DIAGNOSIS — E088 Diabetes mellitus due to underlying condition with unspecified complications: Secondary | ICD-10-CM | POA: Diagnosis not present

## 2019-04-03 DIAGNOSIS — E7800 Pure hypercholesterolemia, unspecified: Secondary | ICD-10-CM

## 2019-04-03 NOTE — Progress Notes (Signed)
Cardiology Office Note:    Date:  04/03/2019   ID:  Janet Chapman, DOB 11-29-1935, MRN [id number]  PCP:  [name], MD  Cardiologist:  Jenean Lindau, MD   Referring MD: [name], MD    ASSESSMENT:    1. Coronary artery disease involving native coronary artery of native heart with unstable angina pectoris ([place])   2. Essential hypertension, benign   3. High cholesterol   4. Diabetes mellitus due to underlying condition with unspecified complications ([place])    PLAN:    In order of problems listed above:  1. Coronary artery disease: Secondary prevention stressed with the patient.  Importance of compliance with diet and medication stressed and she vocalized understanding. 2. Essential hypertension: Blood pressure is stable 3. Mixed dyslipidemia: Diet was discussed.  Lipids followed by primary care physician. 4. Patient will be seen in follow-up appointment in 6 months or earlier if the patient has any concerns.  Importance of regular exercise stressed to the patient and protocol outlined and she promises to follow.   Medication Adjustments/Labs and Tests Ordered: Current medicines are reviewed at length with the patient today.  Concerns regarding medicines are outlined above.  No orders of the defined types were placed in this encounter.  No orders of the defined types were placed in this encounter.    No chief complaint on file.    History of Present Illness:    Janet Chapman is a 83 y.o. female.  Patient denies any problems at this time.  She has known coronary artery disease, essential hypertension, dyslipidemia and diet-controlled diabetes mellitus.  She takes care of activities of daily living.  No chest pain orthopnea or PND.  She does not exercise on a regular basis but is a very active lady.  At the time of my evaluation, the patient is alert awake oriented and in no distress.  Past Medical History:  Diagnosis Date  . Allergic rhinitis   . Diabetes  mellitus without complication (HCC)    diet controlled  . GERD (gastroesophageal reflux disease)   . Hyperlipidemia   . Hypertension   . Osteoarthritis   . Urinary incontinence     Past Surgical History:  Procedure Laterality Date  . ABDOMINAL HYSTERECTOMY  1993   TA  . CARDIOVASCULAR STRESS TEST  2004   [facility], cardiolite-normal  . CHOLECYSTECTOMY  [phone]  . COLONOSCOPY    . LEFT HEART CATH AND CORONARY ANGIOGRAPHY N/A 11/06/2017   Procedure: LEFT HEART CATH AND CORONARY ANGIOGRAPHY;  Surgeon: Burnell Blanks, MD;  Location: [facility];  Service: Cardiovascular;  Laterality: N/A;    Current Medications: Current Meds  Medication Sig  . Apoaequorin (PREVAGEN PO) Take 1 tablet by mouth daily.  Marland Kitchen aspirin 81 MG tablet Take 81 mg by mouth at bedtime.   Marland Kitchen diltiazem (CARDIZEM CD) 120 MG 24 hr capsule TAKE 1 CAPSULE BY MOUTH DAILY  . hydrochlorothiazide (HYDRODIURIL) 25 MG tablet Take 25 mg by mouth daily.  Marland Kitchen levocetirizine (XYZAL) 5 MG tablet Take 5 mg by mouth every evening.  Marland Kitchen losartan (COZAAR) 100 MG tablet Take 100 mg by mouth daily.  . metoprolol succinate (TOPROL-XL) 50 MG 24 hr tablet TAKE 1 TABLET BY MOUTH DAILY (Patient taking differently: 100 mg. )  . montelukast (SINGULAIR) 10 MG tablet TAKE 1 TABLET BY MOUTH AT BEDTIME  . Multiple Vitamins-Minerals (VITAMIN D3 COMPLETE PO) Take by mouth.  . NON FORMULARY daily as needed. CBD Energy  Capsule  . NON FORMULARY Brain Support-2 tablets daily  . simvastatin (ZOCOR) 40 MG tablet TAKE 1 TABLET BY MOUTH EACH NIGHT AT BEDTIME     Allergies:   Codeine   Social History   Socioeconomic History  . Marital status: Married    Spouse name: Not on file  . Number of children: 0  . Years of education: Not on file  . Highest education level: Not on file  Occupational History  . Occupation: retired  Scientific laboratory technician  . Financial resource strain: Not on file  . Food insecurity    Worry: Not on file    Inability: Not on  file  . Transportation needs    Medical: Not on file    Non-medical: Not on file  Tobacco Use  . Smoking status: Former Research scientist (life sciences)  . Smokeless tobacco: Never Used  . Tobacco comment: in college  Substance and Sexual Activity  . Alcohol use: Yes    Comment: 1 glass of wine per month  . Drug use: No  . Sexual activity: Never  Lifestyle  . Physical activity    Days per week: Not on file    Minutes per session: Not on file  . Stress: Not on file  Relationships  . Social Herbalist on phone: Not on file    Gets together: Not on file    Attends religious service: Not on file    Active member of club or organization: Not on file    Attends meetings of clubs or organizations: Not on file    Relationship status: Not on file  Other Topics Concern  . Not on file  Social History Narrative    Regular exercise: yes, Curves 2x a week   Married 48 + years    Diet: fruit and veggies, no FF, some water, artificial sweetener   HCPOA: [name], has living will, full code ( reviewed 2014)              Family History: The patient's family history includes Dementia in her mother; Diabetes in an other family member; Heart attack (age of onset: 84) in her father; Ovarian cancer in some other family members; Ovarian cancer (age of onset: 59) in her sister; Stroke (age of onset: 38) in her mother. There is no history of Colon cancer.  ROS:   Please see the history of present illness.    All other systems reviewed and are negative.  EKGs/Labs/Other Studies Reviewed:    The following studies were reviewed today: Blood work done a couple of months ago including lipids were reviewed   Recent Labs: 12/18/2018: ALT 12; BUN 20; Creatinine, Ser 0.85; Hemoglobin 14.3; Platelets 215.0; Potassium 3.8; Pro B Natriuretic peptide (BNP) 121.0; Sodium 141; TSH 3.92  Recent Lipid Panel    Component Value Date/Time   CHOL 162 12/18/2018 0812   TRIG 73.0 12/18/2018 0812   HDL 62.80 12/18/2018  0812   CHOLHDL 3 12/18/2018 0812   VLDL 14.6 12/18/2018 0812   LDLCALC 85 12/18/2018 0812   LDLDIRECT 119.8 04/26/2009 0846    Physical Exam:    VS:  BP 136/62 (BP Location: Right Arm, Patient Position: Sitting, Cuff Size: Normal)   Pulse 72   Ht 5\' 2"  (1.575 m)   Wt 145 lb (65.8 kg)   BMI 26.52 kg/m     Wt Readings from Last 3 Encounters:  04/03/19 145 lb (65.8 kg)  03/26/19 148 lb 3.2 oz (67.2 kg)  01/17/19  145 lb (65.8 kg)     GEN: Patient is in no acute distress HEENT: Normal NECK: No JVD; No carotid bruits LYMPHATICS: No lymphadenopathy CARDIAC: Hear sounds regular, 2/6 systolic murmur at the apex. RESPIRATORY:  Clear to auscultation without rales, wheezing or rhonchi  ABDOMEN: Soft, non-tender, non-distended MUSCULOSKELETAL:  No edema; No deformity  SKIN: Warm and dry NEUROLOGIC:  Alert and oriented x 3 PSYCHIATRIC:  Normal affect   Signed, Jenean Lindau, MD  04/03/2019 11:36 AM    [place]

## 2019-04-03 NOTE — Patient Instructions (Signed)
Medication Instructions:  Your physician recommends that you continue on your current medications as directed. Please refer to the Current Medication list given to you today.  *If you need a refill on your cardiac medications before your next appointment, please call your pharmacy*  Lab Work: None Ordered  Testing/Procedures: None Ordered  Follow-Up: At [facility], you and your health needs are our priority.  As part of our continuing mission to provide you with exceptional heart care, we have created designated Provider Care Teams.  These Care Teams include your primary Cardiologist (physician) and Advanced Practice Providers (APPs -  Physician Assistants and Nurse Practitioners) who all work together to provide you with the care you need, when you need it.  Your next appointment:   6 month(s)  The format for your next appointment:   In Person  Provider:   You may see Dr. Geraldo Pitter or the following Advanced Practice Provider on your designated Care Team:    Laurann [place], FNP

## 2019-04-22 ENCOUNTER — Other Ambulatory Visit: Payer: Self-pay | Admitting: Family Medicine

## 2019-04-23 ENCOUNTER — Encounter: Payer: Self-pay | Admitting: Allergy

## 2019-04-23 ENCOUNTER — Ambulatory Visit (INDEPENDENT_AMBULATORY_CARE_PROVIDER_SITE_OTHER): Payer: PPO | Admitting: Allergy

## 2019-04-23 ENCOUNTER — Other Ambulatory Visit: Payer: Self-pay

## 2019-04-23 VITALS — BP 128/68 | HR 52 | Temp 97.0°F | Resp 18

## 2019-04-23 DIAGNOSIS — J3089 Other allergic rhinitis: Secondary | ICD-10-CM | POA: Diagnosis not present

## 2019-04-23 NOTE — Progress Notes (Signed)
Follow Up Note  RE: Janet Chapman MRN: [id number] DOB: 14-Mar-1936 Date of Office Visit: 04/23/2019  Referring provider: [name], MD Primary care provider: [name], MD  Chief Complaint: Follow-up  History of Present Illness: I had the pleasure of seeing Janet Chapman for a follow up visit at the [facility] on 04/23/2019. She is a 83 y.o. female, who is being followed for allergic rhinitis, shortness of breath. Today she is here for regular follow up visit. Her previous allergy office visit was on 03/26/2019 with Dr. Maudie Mercury.   Allergic rhinitis Patient cleaned her bedding which has been helping. Still has some spells of post nasal drip but it's doing much better with using azelastine BID. Stopped Flonase.   About twice a day she has to sneeze and the left nostril is always congested.  Still using Singulair daily but stopped using daily antihistamines.  Shortness of breath Resolved.  Assessment and Plan: Janet Chapman is a 83 y.o. female with: Other allergic rhinitis Past history - Rhinitis symptoms with main complaint of postnasal drip since February 2020.  CT sinus in October 2020 showed minimal mucosal thickening and possibly a retention cyst/polyp in the right maxillary sinus.  She was evaluated by ENT. Denies heartburn/reflux symptoms. 2020 skin testing showed: Positive to molds, dog and dust mites. Interim history - doing much better with azelastine nasal spray and environmental control measures.   Continue environmental control measures.  Continue azelastine nasal spray 1-2 sprays per nostril up to twice a day for sinus drainage.   May use fluticasone 1 spray per nostril twice a day as needed for nasal congestion.   Nasal saline spray (i.e., Simply Saline) or nasal saline lavage (i.e., NeilMed) is recommended as needed and prior to medicated nasal sprays.  May use over the counter antihistamines such as Zyrtec (cetirizine), Claritin (loratadine),  Allegra (fexofenadine), or Xyzal (levocetirizine) daily as needed.  Continue montelukast 10mg  daily.  Return in about 4 months (around 08/22/2019).  Diagnostics: None.  Medication List:  Current Outpatient Medications  Medication Sig Dispense Refill   Apoaequorin (PREVAGEN PO) Take 1 tablet by mouth daily.     aspirin 81 MG tablet Take 81 mg by mouth at bedtime.      Azelastine HCl 0.15 % SOLN May use 1-2 sprays per nostril up to twice a day as needed for sinus drainage. 30 mL 5   diltiazem (CARDIZEM CD) 120 MG 24 hr capsule TAKE 1 CAPSULE BY MOUTH DAILY 90 capsule 3   hydrochlorothiazide (HYDRODIURIL) 25 MG tablet Take 25 mg by mouth daily.     levocetirizine (XYZAL) 5 MG tablet Take 5 mg by mouth every evening.     losartan (COZAAR) 100 MG tablet TAKE 1 TABLET BY MOUTH DAILY 90 tablet 1   metoprolol succinate (TOPROL-XL) 50 MG 24 hr tablet TAKE 1 TABLET BY MOUTH DAILY (Patient taking differently: 100 mg. ) 90 tablet 2   montelukast (SINGULAIR) 10 MG tablet TAKE 1 TABLET BY MOUTH AT BEDTIME 30 tablet 3   Multiple Vitamins-Minerals (VITAMIN D3 COMPLETE PO) Take by mouth.     NON FORMULARY daily as needed. CBD Energy Capsule     NON FORMULARY Brain Support-2 tablets daily     simvastatin (ZOCOR) 40 MG tablet TAKE 1 TABLET BY MOUTH EACH NIGHT AT BEDTIME 90 tablet 3   No current facility-administered medications for this visit.    Allergies: Allergies  Allergen Reactions   Codeine Nausea Only  I reviewed her past medical history, social history, family history, and environmental history and no significant changes have been reported from her previous visit.  Review of Systems  Constitutional: Negative for appetite change, chills, fever and unexpected weight change.  HENT: Positive for congestion, postnasal drip and sneezing. Negative for sore throat.   Eyes: Negative for itching.  Respiratory: Negative for cough, chest tightness, shortness of breath and wheezing.     Cardiovascular: Negative for chest pain.  Gastrointestinal: Negative for abdominal pain.  Genitourinary: Negative for difficulty urinating.  Skin: Negative for rash.  Allergic/Immunologic: Positive for environmental allergies. Negative for food allergies.  Neurological: Negative for headaches.   Objective: BP 128/68 (BP Location: Left Arm, Patient Position: Sitting, Cuff Size: Normal)    Pulse (!) 52    Temp (!) 97 F (36.1 C) (Temporal)    Resp 18    SpO2 96%  There is no height or weight on file to calculate BMI. Physical Exam  Constitutional: She is oriented to person, place, and time. She appears well-developed and well-nourished.  HENT:  Head: Normocephalic and atraumatic.  Right Ear: External ear normal.  Left Ear: External ear normal.  Nose: Mucosal edema present.  Mouth/Throat: Oropharynx is clear and moist.  Eyes: Conjunctivae and EOM are normal.  Neck: Neck supple.  Cardiovascular: Normal rate, regular rhythm and normal heart sounds. Exam reveals no gallop and no friction rub.  No murmur heard. Pulmonary/Chest: Effort normal and breath sounds normal. She has no wheezes. She has no rales.  Abdominal: Soft.  Neurological: She is alert and oriented to person, place, and time.  Skin: Skin is warm. No rash noted.  Psychiatric: She has a normal mood and affect. Her behavior is normal.  Nursing note and vitals reviewed.  Previous notes and tests were reviewed. The plan was reviewed with the patient/family, and all questions/concerned were addressed.  It was my pleasure to see Eudelia today and participate in her care. Please feel free to contact me with any questions or concerns.  Sincerely,  [name], DO Allergy & Immunology  [facility] office: [phone] [facility] office: [place] office: [phone]

## 2019-04-23 NOTE — Patient Instructions (Addendum)
Other allergic rhinitis Past skin testing showed: Positive to molds, dog and dust mites.  Continue environmental control measures.  Continue azelastine nasal spray 1-2 sprays per nostril up to twice a day for sinus drainage.   May use fluticasone 1 spray per nostril twice a day as needed for nasal congestion.   Nasal saline spray (i.e., Simply Saline) or nasal saline lavage (i.e., NeilMed) is recommended as needed and prior to medicated nasal sprays.  May use over the counter antihistamines such as Zyrtec (cetirizine), Claritin (loratadine), Allegra (fexofenadine), or Xyzal (levocetirizine) daily as needed.  Continue montelukast 10mg  daily.  Follow up in 4 months or sooner if needed.   Control of House Dust Mite Allergen . Dust mite allergens are a common trigger of allergy and asthma symptoms. While they can be found throughout the house, these microscopic creatures thrive in warm, humid environments such as bedding, upholstered furniture and carpeting. . Because so much time is spent in the bedroom, it is essential to reduce mite levels there.  . Encase pillows, mattresses, and box springs in special allergen-proof fabric covers or airtight, zippered plastic covers.  . Bedding should be washed weekly in hot water (130 F) and dried in a hot dryer. Allergen-proof covers are available for comforters and pillows that can't be regularly washed.  Wendee Copp the allergy-proof covers every few months. Minimize clutter in the bedroom. Keep pets out of the bedroom.  Marland Kitchen Keep humidity less than 50% by using a dehumidifier or air conditioning. You can buy a humidity measuring device called a hygrometer to monitor this.  . If possible, replace carpets with hardwood, linoleum, or washable area rugs. If that's not possible, vacuum frequently with a vacuum that has a HEPA filter. . Remove all upholstered furniture and non-washable window drapes from the bedroom. . Remove all non-washable stuffed toys from  the bedroom.  Wash stuffed toys weekly. Mold Control . Mold and fungi can grow on a variety of surfaces provided certain temperature and moisture conditions exist.  . Outdoor molds grow on plants, decaying vegetation and soil. The major outdoor mold, Alternaria and Cladosporium, are found in very high numbers during hot and dry conditions. Generally, a late summer - fall peak is seen for common outdoor fungal spores. Rain will temporarily lower outdoor mold spore count, but counts rise rapidly when the rainy period ends. . The most important indoor molds are Aspergillus and Penicillium. Dark, humid and poorly ventilated basements are ideal sites for mold growth. The next most common sites of mold growth are the bathroom and the kitchen. Outdoor (Seasonal) Mold Control . Use air conditioning and keep windows closed. . Avoid exposure to decaying vegetation. Marland Kitchen Avoid leaf raking. . Avoid grain handling. . Consider wearing a face mask if working in moldy areas.  Indoor (Perennial) Mold Control  . Maintain humidity below 50%. . Get rid of mold growth on hard surfaces with water, detergent and, if necessary, 5% bleach (do not mix with other cleaners). Then dry the area completely. If mold covers an area more than 10 square feet, consider hiring an indoor environmental professional. . For clothing, washing with soap and water is best. If moldy items cannot be cleaned and dried, throw them away. . Remove sources e.g. contaminated carpets. . Repair and seal leaking roofs or pipes. Using dehumidifiers in damp basements may be helpful, but empty the water and clean units regularly to prevent mildew from forming. All rooms, especially basements, bathrooms and kitchens, require ventilation and cleaning  to deter mold and mildew growth. Avoid carpeting on concrete or damp floors, and storing items in damp areas. Pet Allergen Avoidance: . Contrary to popular opinion, there are no "hypoallergenic" breeds of dogs or  cats. That is because people are not allergic to an animal's hair, but to an allergen found in the animal's saliva, dander (dead skin flakes) or urine. Pet allergy symptoms typically occur within minutes. For some people, symptoms can build up and become most severe 8 to 12 hours after contact with the animal. People with severe allergies can experience reactions in public places if dander has been transported on the pet owners' clothing. Marland Kitchen Keeping an animal outdoors is only a partial solution, since homes with pets in the yard still have higher concentrations of animal allergens. . Before getting a pet, ask your allergist to determine if you are allergic to animals. If your pet is already considered part of your family, try to minimize contact and keep the pet out of the bedroom and other rooms where you spend a great deal of time. . As with dust mites, vacuum carpets often or replace carpet with a hardwood floor, tile or linoleum. . High-efficiency particulate air (HEPA) cleaners can reduce allergen levels over time. . While dander and saliva are the source of cat and dog allergens, urine is the source of allergens from rabbits, hamsters, mice and [place] pigs; so ask a non-allergic family member to clean the animal's cage. . If you have a pet allergy, talk to your allergist about the potential for allergy immunotherapy (allergy shots). This strategy can often provide long-term relief.

## 2019-04-23 NOTE — Assessment & Plan Note (Signed)
Past history - Rhinitis symptoms with main complaint of postnasal drip since February 2020.  CT sinus in October 2020 showed minimal mucosal thickening and possibly a retention cyst/polyp in the right maxillary sinus.  She was evaluated by ENT. Denies heartburn/reflux symptoms. 2020 skin testing showed: Positive to molds, dog and dust mites. Interim history - doing much better with azelastine nasal spray and environmental control measures.   Continue environmental control measures.  Continue azelastine nasal spray 1-2 sprays per nostril up to twice a day for sinus drainage.   May use fluticasone 1 spray per nostril twice a day as needed for nasal congestion.   Nasal saline spray (i.e., Simply Saline) or nasal saline lavage (i.e., NeilMed) is recommended as needed and prior to medicated nasal sprays.  May use over the counter antihistamines such as Zyrtec (cetirizine), Claritin (loratadine), Allegra (fexofenadine), or Xyzal (levocetirizine) daily as needed.  Continue montelukast 10mg  daily.

## 2019-05-10 ENCOUNTER — Other Ambulatory Visit: Payer: Self-pay | Admitting: Family Medicine

## 2019-05-27 ENCOUNTER — Ambulatory Visit (INDEPENDENT_AMBULATORY_CARE_PROVIDER_SITE_OTHER): Payer: PPO | Admitting: Family Medicine

## 2019-05-27 ENCOUNTER — Encounter: Payer: Self-pay | Admitting: Family Medicine

## 2019-05-27 ENCOUNTER — Telehealth: Payer: Self-pay

## 2019-05-27 ENCOUNTER — Other Ambulatory Visit: Payer: Self-pay

## 2019-05-27 VITALS — BP 130/70 | HR 48 | Temp 98.2°F | Ht 62.25 in | Wt 146.8 lb

## 2019-05-27 DIAGNOSIS — I10 Essential (primary) hypertension: Secondary | ICD-10-CM | POA: Diagnosis not present

## 2019-05-27 DIAGNOSIS — R5382 Chronic fatigue, unspecified: Secondary | ICD-10-CM

## 2019-05-27 DIAGNOSIS — L299 Pruritus, unspecified: Secondary | ICD-10-CM | POA: Diagnosis not present

## 2019-05-27 DIAGNOSIS — R413 Other amnesia: Secondary | ICD-10-CM | POA: Diagnosis not present

## 2019-05-27 MED ORDER — LOSARTAN POTASSIUM-HCTZ 100-25 MG PO TABS: 1.0000 | ORAL_TABLET | Freq: Every day | ORAL | 3 refills | Status: DC

## 2019-05-27 MED ORDER — KETOCONAZOLE 2 % EX SHAM: 1.0000 "application " | MEDICATED_SHAMPOO | CUTANEOUS | 0 refills | Status: DC

## 2019-05-27 NOTE — Telephone Encounter (Signed)
Patient contacted the office and stated that she is needing prescription for the Losartan/HCTZ combination pill sent to [facility]. She states she takes two separate pills now, but it would be easier for her to take the combination pill. Thank you!

## 2019-05-27 NOTE — Assessment & Plan Note (Signed)
Trial of keotconazole shampoo.

## 2019-05-27 NOTE — Progress Notes (Signed)
Chief Complaint  Patient presents with  . Memory Issues  . Dizziness    ?coming from medication  . Fatigue  . Pain in Buttocks    ?sciatica  . Itchy Scalp    History of Present Illness: HPI    84 year old female presents with multiple complaints.  1. Dizziness, fatigue: she wonders if this is coming from her medications.  She noted dizziness ever since the HCTZ separated out from the losartan 07/2018.Marland Kitchen notes occ intermittant lightheadedness off and on. No proceeding symptoms. No CP, no SOB, no palpitations BP well controlled. She is not very active.  2 pain in buttocks x several months... right sciatic notch.. feels like tingling, mild pain.  using CBD poil.  3. itchy scalp x several months.Marland Kitchen off and on. No dry scalp  No new hair treatments.  Azelastine is helping with allergies and mucus in throat.  Singulair helps her sleep at night.. wakes up somewhat groggy.    Nml thyroid, nml cbc and CMET  She is concerned her memory is worsening.  Had felt it was better with prevagen, but  Now she thinks it may be worsening. Got lost driving in [place].  She is interested in referral to neurology.  Mother with dementia, unknown type.  This visit occurred during the SARS-CoV-2 public health emergency.  Safety protocols were in place, including screening questions prior to the visit, additional usage of staff PPE, and extensive cleaning of exam room while observing appropriate contact time as indicated for disinfecting solutions.   COVID 19 screen:  No recent travel or known exposure to COVID19 The patient denies respiratory symptoms of COVID 19 at this time. The importance of social distancing was discussed today.     ROS    Past Medical History:  Diagnosis Date  . Allergic rhinitis   . Diabetes mellitus without complication (HCC)    diet controlled  . GERD (gastroesophageal reflux disease)   . Hyperlipidemia   . Hypertension   . Osteoarthritis   . Urinary  incontinence     reports that she has quit smoking. She has never used smokeless tobacco. She reports current alcohol use. She reports that she does not use drugs.   Current Outpatient Medications:  .  Apoaequorin (PREVAGEN PO), Take 1 tablet by mouth daily., Disp: , Rfl:  .  aspirin 81 MG tablet, Take 81 mg by mouth at bedtime. , Disp: , Rfl:  .  Azelastine HCl 0.15 % SOLN, May use 1-2 sprays per nostril up to twice a day as needed for sinus drainage., Disp: 30 mL, Rfl: 5 .  diltiazem (CARDIZEM CD) 120 MG 24 hr capsule, TAKE 1 CAPSULE BY MOUTH DAILY, Disp: 90 capsule, Rfl: 3 .  hydrochlorothiazide (HYDRODIURIL) 25 MG tablet, TAKE 1 TABLET BY MOUTH DAILY, Disp: 90 tablet, Rfl: 1 .  losartan (COZAAR) 100 MG tablet, TAKE 1 TABLET BY MOUTH DAILY, Disp: 90 tablet, Rfl: 1 .  metoprolol succinate (TOPROL-XL) 50 MG 24 hr tablet, TAKE 1 TABLET BY MOUTH DAILY, Disp: 90 tablet, Rfl: 2 .  montelukast (SINGULAIR) 10 MG tablet, TAKE 1 TABLET BY MOUTH AT BEDTIME, Disp: 30 tablet, Rfl: 3 .  Multiple Vitamins-Minerals (VITAMIN D3 COMPLETE PO), Take by mouth., Disp: , Rfl:  .  NON FORMULARY, daily as needed. CBD Energy Capsule, Disp: , Rfl:  .  NON FORMULARY, Brain Support-2 tablets daily, Disp: , Rfl:  .  simvastatin (ZOCOR) 40 MG tablet, TAKE 1 TABLET BY MOUTH EACH NIGHT AT BEDTIME, Disp:  90 tablet, Rfl: 3   Observations/Objective: Blood pressure 130/70, pulse (!) 48, temperature 98.2 F (36.8 C), temperature source Temporal, height 5' 2.25" (1.581 m), weight 146 lb 12 oz (66.6 kg), SpO2 98 %.  Physical Exam Constitutional:      General: She is not in acute distress.    Appearance: Normal appearance. She is well-developed. She is not ill-appearing or toxic-appearing.  HENT:     Head: Normocephalic.     Right Ear: Hearing, tympanic membrane, ear canal and external ear normal. Tympanic membrane is not erythematous, retracted or bulging.     Left Ear: Hearing, tympanic membrane, ear canal and external ear  normal. Tympanic membrane is not erythematous, retracted or bulging.     Nose: No mucosal edema or rhinorrhea.     Right Sinus: No maxillary sinus tenderness or frontal sinus tenderness.     Left Sinus: No maxillary sinus tenderness or frontal sinus tenderness.     Mouth/Throat:     Pharynx: Uvula midline.  Eyes:     General: Lids are normal. Lids are everted, no foreign bodies appreciated.     Conjunctiva/sclera: Conjunctivae normal.     Pupils: Pupils are equal, round, and reactive to light.  Neck:     Thyroid: No thyroid mass or thyromegaly.     Vascular: No carotid bruit.     Trachea: Trachea normal.  Cardiovascular:     Rate and Rhythm: Regular rhythm. Bradycardia present.     Pulses: Normal pulses.     Heart sounds: Normal heart sounds, S1 normal and S2 normal. No murmur. No friction rub. No gallop.   Pulmonary:     Effort: Pulmonary effort is normal. No tachypnea or respiratory distress.     Breath sounds: Normal breath sounds. No decreased breath sounds, wheezing, rhonchi or rales.  Abdominal:     General: Bowel sounds are normal.     Palpations: Abdomen is soft.     Tenderness: There is no abdominal tenderness.  Musculoskeletal:     Cervical back: Normal range of motion and neck supple.  Skin:    General: Skin is warm and dry.     Findings: No rash.  Neurological:     Mental Status: She is alert.  Psychiatric:        Mood and Affect: Mood is not anxious or depressed.        Speech: Speech normal.        Behavior: Behavior normal. Behavior is cooperative.        Thought Content: Thought content normal.        Judgment: Judgment normal.      Assessment and Plan      [name], MD

## 2019-05-27 NOTE — Patient Instructions (Addendum)
Check with pharmacy to see if losartan and HCTZ can be recombined. Do the have the combination available? If fatigue continuing consider stopping singulair.  Work on increasing exercise or strengthening exercises. Can use tylenol for pain in buttock.  Apply ketoconazole shampoo on scalp.  We will call with neurology referral.

## 2019-05-27 NOTE — Assessment & Plan Note (Signed)
Neg lab eval.  May be deconditioning.  No CP.

## 2019-05-27 NOTE — Assessment & Plan Note (Signed)
Good control.. no sign of orthostasis.  Pt with bradycardia, but this is not new. If not improving with other changes.. consider decreasing metoprolol.

## 2019-05-27 NOTE — Assessment & Plan Note (Signed)
Mother with dementia around same age.   Neg Lab eval. MMSE 29/30.Marland Kitchen mild changes.  Pt wishes to be proactive... had SE to aricept and galantamine.  Thought prevagen was helping, but no longer.  Will refer to neuro for possible further eval/ other treatment options.

## 2019-05-27 NOTE — Telephone Encounter (Signed)
Rx sent in as requested. 

## 2019-05-29 DIAGNOSIS — Z1231 Encounter for screening mammogram for malignant neoplasm of breast: Secondary | ICD-10-CM | POA: Diagnosis not present

## 2019-06-03 ENCOUNTER — Other Ambulatory Visit: Payer: PPO

## 2019-06-17 ENCOUNTER — Other Ambulatory Visit: Payer: Self-pay | Admitting: Family Medicine

## 2019-06-18 ENCOUNTER — Telehealth: Payer: Self-pay | Admitting: Neurology

## 2019-06-18 ENCOUNTER — Ambulatory Visit: Payer: PPO | Admitting: Neurology

## 2019-06-18 ENCOUNTER — Encounter: Payer: Self-pay | Admitting: Neurology

## 2019-06-18 ENCOUNTER — Other Ambulatory Visit: Payer: Self-pay

## 2019-06-18 VITALS — BP 142/89 | HR 57 | Temp 97.1°F | Ht 62.0 in | Wt 147.0 lb

## 2019-06-18 DIAGNOSIS — R413 Other amnesia: Secondary | ICD-10-CM

## 2019-06-18 MED ORDER — RIVASTIGMINE 4.6 MG/24HR TD PT24: 4.6000 mg | MEDICATED_PATCH | Freq: Every day | TRANSDERMAL | 3 refills | Status: DC

## 2019-06-18 NOTE — Telephone Encounter (Signed)
health team order sent to GI. No auth they will reach out to the patient to schedule.  

## 2019-06-18 NOTE — Progress Notes (Signed)
Reason for visit: Memory disorder  Referring physician: Dr. Johnsie Cancel Janet Chapman is a 84 y.o. female  History of present illness:  Janet Chapman is an 84 year old right-handed white female with a history of some issues with mild memory changes over the last year or 2.  The patient has noted that she is somewhat forgetful, she has difficulty remembering words or names for people.  She may sometimes have difficulty remembering recent events.  She has not altered any of her activities of daily living.  She is still operating a motor vehicle without difficulty, she is keeping up with medications and appointments without difficulty.  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  The patient has been on some Prevagen and her husband has felt that this has helped her memory.  The patient is concerned that her mother had dementia as she got older.  The patient denies any numbness or weakness of extremities or any difficulty with balance or difficulty controlling the bowels or the bladder.  She is sleeping well at night, she denies any significant fatigue during the day.  She comes to the office today for an evaluation.  She indicates that she was placed on some medication for memory a year or 2 ago, she cannot remember the name of it but did not tolerate it secondary to nausea.  Past Medical History:  Diagnosis Date  . Allergic rhinitis   . Diabetes mellitus without complication (HCC)    diet controlled  . GERD (gastroesophageal reflux disease)   . Hyperlipidemia   . Hypertension   . Osteoarthritis   . Urinary incontinence     Past Surgical History:  Procedure Laterality Date  . ABDOMINAL HYSTERECTOMY  1993   TA  . CARDIOVASCULAR STRESS TEST  2004   [facility], cardiolite-normal  . CHOLECYSTECTOMY  [phone]  . COLONOSCOPY    . LEFT HEART CATH AND CORONARY ANGIOGRAPHY N/A 11/06/2017   Procedure: LEFT HEART CATH AND CORONARY ANGIOGRAPHY;  Surgeon:  Burnell Blanks, MD;  Location: [facility];  Service: Cardiovascular;  Laterality: N/A;    Family History  Problem Relation Age of Onset  . Heart attack Father 46  . Dementia Mother   . Stroke Mother 59  . Ovarian cancer Sister 60  . Diabetes Other        Maternal side  . Ovarian cancer Other        Grandmother  . Ovarian cancer Other        aunt  . Colon cancer Neg Hx     Social history:  reports that she has quit smoking. She has never used smokeless tobacco. She reports current alcohol use. She reports that she does not use drugs.  Medications:  Prior to Admission medications   Medication Sig Start Date End Date Taking? Authorizing Provider  Apoaequorin (PREVAGEN PO) Take 1 tablet by mouth daily.   Yes [provider]  aspirin 81 MG tablet Take 81 mg by mouth at bedtime.    Yes [provider]  Azelastine HCl 0.15 % SOLN May use 1-2 sprays per nostril up to twice a day as needed for sinus drainage. 03/26/19  Yes Garnet Sierras, DO  diltiazem (CARDIZEM CD) 120 MG 24 hr capsule TAKE 1 CAPSULE BY MOUTH DAILY 12/06/18  Yes Bedsole, Amy E, MD  diltiazem (TIAZAC) 120 MG 24 hr capsule Take by mouth. 09/05/18  Yes [provider]  ketoconazole (  NIZORAL) 2 % shampoo Apply 1 application topically 2 (two) times a week. 05/29/19  Yes Bedsole, Amy E, MD  losartan-hydrochlorothiazide (HYZAAR) 100-25 MG tablet Take 1 tablet by mouth daily. 05/27/19  Yes Bedsole, Amy E, MD  metoprolol succinate (TOPROL-XL) 50 MG 24 hr tablet TAKE 1 TABLET BY MOUTH DAILY 11/29/18  Yes Bedsole, Amy E, MD  montelukast (SINGULAIR) 10 MG tablet TAKE 1 TABLET BY MOUTH AT BEDTIME 06/17/19  Yes Bedsole, Amy E, MD  Multiple Vitamins-Minerals (VITAMIN D3 COMPLETE PO) Take by mouth.   Yes [provider]  NON FORMULARY daily as needed. CBD Energy Capsule   Yes [provider]  NON FORMULARY Brain Support-2 tablets daily   Yes [provider]  simvastatin (ZOCOR)  40 MG tablet TAKE 1 TABLET BY MOUTH EACH NIGHT AT BEDTIME 02/06/19  Yes Bedsole, Amy E, MD      Allergies  Allergen Reactions  . Codeine Nausea Only    ROS:  Out of a complete 14 system review of symptoms, the patient complains only of the following symptoms, and all other reviewed systems are negative.  Memory problems  Temperature (!) 97.1 F (36.2 C), height 5\' 2"  (1.575 m), weight 147 lb (66.7 kg).  Physical Exam  General: The patient is alert and cooperative at the time of the examination.  Eyes: Pupils are equal, round, and reactive to light. Discs are flat bilaterally.  Neck: The neck is supple, no carotid bruits are noted.  Respiratory: The respiratory examination is clear.  Cardiovascular: The cardiovascular examination reveals a regular rate and rhythm, no obvious murmurs or rubs are noted.  Skin: Extremities are without significant edema.  Neurologic Exam  Mental status: The patient is alert and oriented x 3 at the time of the examination. The Mini-Mental status examination done today shows a total score 27/30.  Cranial nerves: Facial symmetry is present. There is good sensation of the face to pinprick and soft touch bilaterally. The strength of the facial muscles and the muscles to head turning and shoulder shrug are normal bilaterally. Speech is well enunciated, no aphasia or dysarthria is noted. Extraocular movements are full. Visual fields are full. The tongue is midline, and the patient has symmetric elevation of the soft palate. No obvious hearing deficits are noted.  Motor: The motor testing reveals 5 over 5 strength of all 4 extremities. Good symmetric motor tone is noted throughout.  Sensory: Sensory testing is intact to pinprick, soft touch, vibration sensation, and position sense on all 4 extremities. No evidence of extinction is noted.  Coordination: Cerebellar testing reveals good finger-nose-finger and heel-to-shin bilaterally.  Gait and station:  Gait is normal. Tandem gait is slightly unsteady. Romberg is negative. No drift is seen.  Reflexes: Deep tendon reflexes are symmetric and normal bilaterally. Toes are downgoing bilaterally.   CT head 01/04/18:  IMPRESSION: No cause of the presenting symptoms is identified. Ordinary age related brain volume loss without evidence of focal infarction or other intracranial pathology, besides ordinary atherosclerotic calcification of the vessels at the base of the brain.  * CT scan images were reviewed online. I agree with the written report.    Assessment/Plan:  1.  Mild cognitive impairment  The patient is experiencing some mild changes in memory.  She has not altered any of her activities of daily living.  The patient is interested in going on medication for memory, we will try an Exelon patch in low-dose.  The patient be set up for CT of the  head, she will have blood work today.  She will follow-up here in 6 months.  Jill Alexanders MD 06/18/2019 11:22 AM  [facility] [address] [place], [place] [address]  Phone [phone] Fax [phone]

## 2019-06-18 NOTE — Patient Instructions (Signed)
We will start [place] patch for the memory.

## 2019-06-19 LAB — SEDIMENTATION RATE: Sed Rate: 2 mm/hr (ref 0–40)

## 2019-06-19 LAB — RPR: RPR Ser Ql: NONREACTIVE

## 2019-06-19 LAB — VITAMIN B12: Vitamin B-12: 461 pg/mL (ref 232–1245)

## 2019-06-26 ENCOUNTER — Telehealth: Payer: Self-pay | Admitting: Family Medicine

## 2019-06-26 ENCOUNTER — Ambulatory Visit
Admission: RE | Admit: 2019-06-26 | Discharge: 2019-06-26 | Disposition: A | Discharge status: Home / Self Care | Payer: PPO | Source: Ambulatory Visit | Attending: Neurology | Admitting: Neurology

## 2019-06-26 DIAGNOSIS — R413 Other amnesia: Secondary | ICD-10-CM

## 2019-06-26 NOTE — Chronic Care Management (AMB) (Signed)
Chronic Care Management   Note  06/26/2019 Name: Janet Chapman MRN: [id number] DOB: 12-03-1935  Janet Chapman is a 84 y.o. year old female who is a primary care patient of Bedsole, Amy E, MD. I reached out to [name] by phone today in response to a referral sent by [name]'s PCP, [name], MD.   Janet Chapman was given information about Chronic Care Management services today including:  1. CCM service includes personalized support from designated clinical staff supervised by her physician, including individualized plan of care and coordination with other care providers 2. 24/7 contact phone numbers for assistance for urgent and routine care needs. 3. Service will only be billed when office clinical staff spend 20 minutes or more in a month to coordinate care. 4. Only one practitioner may furnish and bill the service in a calendar month. 5. The patient may stop CCM services at any time (effective at the end of the month) by phone call to the office staff. 6. The patient will be responsible for cost sharing (co-pay) of up to 20% of the service fee (after annual deductible is met).  Patient agreed to services and verbal consent obtained.   Follow up plan:   Raynicia Dukes [employer] Scheduler

## 2019-06-26 NOTE — Progress Notes (Signed)
°  Chronic Care Management   Outreach Note  06/26/2019 Name: Janet Chapman MRN: [id number] DOB: [birth year]  Referred by: [name], MD Reason for referral : No chief complaint on file.   An unsuccessful telephone outreach was attempted today. The patient was referred to the pharmacist for assistance with care management and care coordination.   Follow Up Plan:   Raynicia Dukes [employer] Scheduler

## 2019-06-29 ENCOUNTER — Telehealth: Payer: Self-pay | Admitting: Neurology

## 2019-06-29 NOTE — Telephone Encounter (Signed)
  I called the patient.  CT of the head is normal for age.  The patient is being tried on an Exelon patch.  CT head 06/26/19:  IMPRESSION: Unremarkable CT scan of the head without contrast showing age-appropriate changes of small vessel disease and cortical atrophy.  No significant change compared with CT head dated 01/04/2018

## 2019-07-01 ENCOUNTER — Ambulatory Visit: Payer: PPO

## 2019-07-01 ENCOUNTER — Telehealth: Payer: Self-pay

## 2019-07-01 ENCOUNTER — Other Ambulatory Visit: Payer: Self-pay

## 2019-07-01 DIAGNOSIS — J3089 Other allergic rhinitis: Secondary | ICD-10-CM

## 2019-07-01 DIAGNOSIS — I25110 Atherosclerotic heart disease of native coronary artery with unstable angina pectoris: Secondary | ICD-10-CM

## 2019-07-01 DIAGNOSIS — E7800 Pure hypercholesterolemia, unspecified: Secondary | ICD-10-CM

## 2019-07-01 DIAGNOSIS — E0821 Diabetes mellitus due to underlying condition with diabetic nephropathy: Secondary | ICD-10-CM

## 2019-07-01 DIAGNOSIS — I10 Essential (primary) hypertension: Secondary | ICD-10-CM

## 2019-07-01 DIAGNOSIS — K219 Gastro-esophageal reflux disease without esophagitis: Secondary | ICD-10-CM

## 2019-07-01 DIAGNOSIS — M5416 Radiculopathy, lumbar region: Secondary | ICD-10-CM

## 2019-07-01 DIAGNOSIS — G8929 Other chronic pain: Secondary | ICD-10-CM

## 2019-07-01 NOTE — Telephone Encounter (Signed)
I would like to request a referral for Janet Chapman to chronic care management pharmacy services focusing on the following conditions:   Essential hypertension, benign  [I10]  Diabetes mellitus, controlled [E11.9]  GERD [K21.9]  Debbora Dus, PharmD Clinical Pharmacist [facility] [phone]

## 2019-07-01 NOTE — Telephone Encounter (Signed)
Please sign pharmacy referral. 

## 2019-07-01 NOTE — Patient Instructions (Signed)
July 01, 2019  Dear [name],  It was a pleasure meeting you during our initial appointment on July 01, 2019. Below is a summary of the goals we discussed and components of chronic care management. Please contact me anytime with questions or concerns.   Visit Information  Goals Addressed            This Visit's Progress   . Pharmacy Care Plan       Current Barriers:  . Chronic Disease Management support, education, and care coordination needs related to HTN, DM, CAD, allergic rhinitis, GERD, chronic pain, OA, mild cognitive impairment  Pharmacist Clinical Goal(s):  Marland Kitchen Achieve cholesterol goal of LDL < 70 mg/dL. Increase daily exercise with goal of 30 minutes, 5 days a week. Continue to eat a heart heathy diet high in lean meats and vegetables. . Prevent memory decline and assess appropriateness of brain supplements. I have reviewed the Brain Support and Brain Boost supplements. It seems many of the ingredients overlap with your daily multivitamin. There is little research to support these supplements. I would recommend discontinuing both brain supplements and continuing your daily multivitamin while focusing on healthy diet high in nutrients such as the DASH or mediterranean diet for preventing memory loss. . Remain up to date of vaccinations. Recommend Tetanus booster and Shingles vaccine.  Interventions: . Comprehensive medication review performed.  Patient Self Care Activities:  . Takes medications as prescribed.  Initial goal documentation        Ms. Beutler was given information about Chronic Care Management services today including:  1. CCM service includes personalized support from designated clinical staff supervised by her physician, including individualized plan of care and coordination with other care providers 2. 24/7 contact phone numbers for assistance for urgent and routine care needs. 3. Service will only be billed when office clinical staff spend 20  minutes or more in a month to coordinate care. 4. Only one practitioner may furnish and bill the service in a calendar month. 5. The patient may stop CCM services at any time (effective at the end of the month) by phone call to the office staff. 6. The patient will be responsible for cost sharing (co-pay) of up to 20% of the service fee (after annual deductible is met).  Patient agreed to services and verbal consent obtained.   Telephone follow up appointment with pharmacy team member scheduled for:  Monday, Sep 29, 2019 11:30 AM (telephone)  Debbora Dus, PharmD Clinical Pharmacist [facility]   [place]ron stands for "Dietary Approaches to Stop Hypertension." The DASH eating plan is a healthy eating plan that has been shown to reduce high blood pressure (hypertension). It may also reduce your risk for type 2 diabetes, heart disease, and stroke. The DASH eating plan may also help with weight loss. What are tips for following this plan?  General guidelines  Avoid eating more than 2,300 mg (milligrams) of salt (sodium) a day. If you have hypertension, you may need to reduce your sodium intake to 1,500 mg a day.  Limit alcohol intake to no more than 1 drink a day for nonpregnant women and 2 drinks a day for men. One drink equals 12 oz of beer, 5 oz of wine, or 1 oz of hard liquor.  Work with your health care provider to maintain a healthy body weight or to lose weight. Ask what an ideal weight is for you.  Get at least 30 minutes of  exercise that causes your heart to beat faster (aerobic exercise) most days of the week. Activities may include walking, swimming, or biking.  Work with your health care provider or diet and nutrition specialist (dietitian) to adjust your eating plan to your individual calorie needs. Reading food labels   Check food labels for the amount of sodium per serving. Choose foods with less than 5 percent of the  Daily Value of sodium. Generally, foods with less than 300 mg of sodium per serving fit into this eating plan.  To find whole grains, look for the word "whole" as the first word in the ingredient list. Shopping  Buy products labeled as "low-sodium" or "no salt added."  Buy fresh foods. Avoid canned foods and premade or frozen meals. Cooking  Avoid adding salt when cooking. Use salt-free seasonings or herbs instead of table salt or sea salt. Check with your health care provider or pharmacist before using salt substitutes.  Do not fry foods. Cook foods using healthy methods such as baking, boiling, grilling, and broiling instead.  Cook with heart-healthy oils, such as olive, canola, soybean, or sunflower oil. Meal planning  Eat a balanced diet that includes: ? 5 or more servings of fruits and vegetables each day. At each meal, try to fill half of your plate with fruits and vegetables. ? Up to 6-8 servings of whole grains each day. ? Less than 6 oz of lean meat, poultry, or fish each day. A 3-oz serving of meat is about the same size as a deck of cards. One egg equals 1 oz. ? 2 servings of low-fat dairy each day. ? A serving of nuts, seeds, or beans 5 times each week. ? Heart-healthy fats. Healthy fats called Omega-3 fatty acids are found in foods such as flaxseeds and coldwater fish, like sardines, salmon, and mackerel.  Limit how much you eat of the following: ? Canned or prepackaged foods. ? Food that is high in trans fat, such as fried foods. ? Food that is high in saturated fat, such as fatty meat. ? Sweets, desserts, sugary drinks, and other foods with added sugar. ? Full-fat dairy products.  Do not salt foods before eating.  Try to eat at least 2 vegetarian meals each week.  Eat more home-cooked food and less restaurant, buffet, and fast food.  When eating at a restaurant, ask that your food be prepared with less salt or no salt, if possible. What foods are  recommended? The items listed may not be a complete list. Talk with your dietitian about what dietary choices are best for you. Grains Whole-grain or whole-wheat bread. Whole-grain or whole-wheat pasta. Brown rice. Modena Morrow. Bulgur. Whole-grain and low-sodium cereals. Pita bread. Low-fat, low-sodium crackers. Whole-wheat flour tortillas. Vegetables Fresh or frozen vegetables (raw, steamed, roasted, or grilled). Low-sodium or reduced-sodium tomato and vegetable juice. Low-sodium or reduced-sodium tomato sauce and tomato paste. Low-sodium or reduced-sodium canned vegetables. Fruits All fresh, dried, or frozen fruit. Canned fruit in natural juice (without added sugar). Meat and other protein foods Skinless chicken or [place]. Ground chicken or [place]. Pork with fat trimmed off. Fish and seafood. Egg whites. Dried beans, peas, or lentils. Unsalted nuts, nut butters, and seeds. Unsalted canned beans. Lean cuts of beef with fat trimmed off. Low-sodium, lean deli meat. Dairy Low-fat (1%) or fat-free (skim) milk. Fat-free, low-fat, or reduced-fat cheeses. Nonfat, low-sodium ricotta or cottage cheese. Low-fat or nonfat yogurt. Low-fat, low-sodium cheese. Fats and oils Soft margarine without trans fats. Vegetable oil. Low-fat, reduced-fat,  or light mayonnaise and salad dressings (reduced-sodium). Canola, safflower, olive, soybean, and sunflower oils. Avocado. Seasoning and other foods Herbs. Spices. Seasoning mixes without salt. Unsalted popcorn and pretzels. Fat-free sweets. What foods are not recommended? The items listed may not be a complete list. Talk with your dietitian about what dietary choices are best for you. Grains Baked goods made with fat, such as croissants, muffins, or some breads. Dry pasta or rice meal packs. Vegetables Creamed or fried vegetables. Vegetables in a cheese sauce. Regular canned vegetables (not low-sodium or reduced-sodium). Regular canned tomato sauce and paste (not  low-sodium or reduced-sodium). Regular tomato and vegetable juice (not low-sodium or reduced-sodium). Angie Fava. Olives. Fruits Canned fruit in a light or heavy syrup. Fried fruit. Fruit in cream or butter sauce. Meat and other protein foods Fatty cuts of meat. Ribs. Fried meat. Berniece Salines. Sausage. Bologna and other processed lunch meats. Salami. Fatback. Hotdogs. Bratwurst. Salted nuts and seeds. Canned beans with added salt. Canned or smoked fish. Whole eggs or egg yolks. Chicken or [place] with skin. Dairy Whole or 2% milk, cream, and half-and-half. Whole or full-fat cream cheese. Whole-fat or sweetened yogurt. Full-fat cheese. Nondairy creamers. Whipped toppings. Processed cheese and cheese spreads. Fats and oils Butter. Stick margarine. Lard. Shortening. Ghee. Bacon fat. Tropical oils, such as coconut, palm kernel, or palm oil. Seasoning and other foods Salted popcorn and pretzels. Onion salt, garlic salt, seasoned salt, table salt, and sea salt. Worcestershire sauce. Tartar sauce. Barbecue sauce. Teriyaki sauce. Soy sauce, including reduced-sodium. Steak sauce. Canned and packaged gravies. Fish sauce. Oyster sauce. Cocktail sauce. Horseradish that you find on the shelf. Ketchup. Mustard. Meat flavorings and tenderizers. Bouillon cubes. Hot sauce and Tabasco sauce. Premade or packaged marinades. Premade or packaged taco seasonings. Relishes. Regular salad dressings. Where to find more information:  National Heart, Lung, and [place]: https://wilson-eaton.com/  American Heart Association: www.heart.org Summary  The DASH eating plan is a healthy eating plan that has been shown to reduce high blood pressure (hypertension). It may also reduce your risk for type 2 diabetes, heart disease, and stroke.  With the DASH eating plan, you should limit salt (sodium) intake to 2,300 mg a day. If you have hypertension, you may need to reduce your sodium intake to 1,500 mg a day.  When on the DASH eating plan,  aim to eat more fresh fruits and vegetables, whole grains, lean proteins, low-fat dairy, and heart-healthy fats.  Work with your health care provider or diet and nutrition specialist (dietitian) to adjust your eating plan to your individual calorie needs. This information is not intended to replace advice given to you by your health care provider. Make sure you discuss any questions you have with your health care provider. Document Revised: 04/13/2017 Document Reviewed: 04/24/2016 Elsevier Patient Education  2020 [employer].

## 2019-07-01 NOTE — Chronic Care Management (AMB) (Signed)
Chronic Care Management Pharmacy  Name: Janet Chapman  MRN: [id number] DOB: 1936/01/05  Chief Complaint/ HPI  [name],  84 y.o., female presents for their Initial CCM visit with the clinical pharmacist via telephone.  PCP : [name], MD   Patient concerns: would like to know which memory supplement is the best  Their chronic conditions include: HTN, DM, CAD, allergic rhinitis, GERD, chronic pain, OA, mild cognitive impairment  Office Visits:  05/27/19: [name], memory loss, dizziness, fatigue - possilbly medication induced, noticed ever since separating HCTZ from combo to two separate drugs, CBD oil for pain, allergies well controlled, itchy scalp, referral to neurology for memory  Consult Visit:  06/29/19: [name], neurology - CT of head normal for age, trial Exelon patch  04/23/19: [name], Allergy - post nasal drip doing much better with azelastine BID, stopped flonase, Singulair daily, stopped antihistamine, still some sneezing and congestion (allergic to molds, dog, dust mites) - continue current medications  04/03/19: [name], cardiology - no medication changes  Allergies  Allergen Reactions  . Codeine Nausea Only   Medications: Outpatient Encounter Medications as of 07/01/2019  Medication Sig  . Apoaequorin (PREVAGEN PO) Take 1 tablet by mouth daily.  Marland Kitchen aspirin 81 MG tablet Take 81 mg by mouth at bedtime.   . Azelastine HCl 0.15 % SOLN May use 1-2 sprays per nostril up to twice a day as needed for sinus drainage.  . diltiazem (CARDIZEM CD) 120 MG 24 hr capsule TAKE 1 CAPSULE BY MOUTH DAILY  . diltiazem (TIAZAC) 120 MG 24 hr capsule Take by mouth.  Marland Kitchen ketoconazole (NIZORAL) 2 % shampoo Apply 1 application topically 2 (two) times a week.  . losartan-hydrochlorothiazide (HYZAAR) 100-25 MG tablet Take 1 tablet by mouth daily.  . metoprolol succinate (TOPROL-XL) 50 MG 24 hr tablet TAKE 1 TABLET BY MOUTH DAILY  . montelukast (SINGULAIR) 10 MG  tablet TAKE 1 TABLET BY MOUTH AT BEDTIME  . Multiple Vitamins-Minerals (VITAMIN D3 COMPLETE PO) Take by mouth.  . NON FORMULARY daily as needed. CBD Energy Capsule  . NON FORMULARY Brain Support-2 tablets daily  . rivastigmine (EXELON) 4.6 mg/24hr Place 1 patch (4.6 mg total) onto the skin daily.  . simvastatin (ZOCOR) 40 MG tablet TAKE 1 TABLET BY MOUTH EACH NIGHT AT BEDTIME   No facility-administered encounter medications on file as of 07/01/2019.   Current Diagnosis/Assessment: Goals    . Patient Stated (pt-stated)     Starting 11/27/2016, I will continue reading daily to keep my brain sharp.    . Patient Stated     Starting 12/04/2017, I will continue to take medications as prescribed.     . Pharmacy Care Plan     Current Barriers:  . Chronic Disease Management support, education, and care coordination needs related to HTN, DM, CAD, allergic rhinitis, GERD, chronic pain, OA, mild cognitive impairment  Pharmacist Clinical Goal(s):  Marland Kitchen Achieve cholesterol goal of LDL < 70 mg/dL. Increase daily exercise with goal of 30 minutes, 5 days a week. Continue to eat a heart heathy diet high in lean meats and vegetables. . Prevent memory decline and assess appropriateness of brain supplements. I have reviewed the Brain Support and Brain Boost supplements. It seems many of the ingredients overlap with your daily multivitamin. There is little research to support these supplements. I would recommend discontinuing both brain supplements and continuing your daily multivitamin while focusing on healthy diet high in nutrients such as the DASH or mediterranean  diet for preventing memory loss. . Remain up to date of vaccinations. Recommend Tetanus booster and Shingles vaccine.  Interventions: . Comprehensive medication review performed.  Patient Self Care Activities:  . Takes medications as prescribed.  Initial goal documentation       Diabetes   Recent Relevant Labs: Lab Results  Component Value  Date/Time   HGBA1C 6.4 12/18/2018 08:12 AM   HGBA1C 6.3 12/04/2017 09:09 AM   MICROALBUR 0.6 05/18/2010 09:38 AM   MICROALBUR 1.3 01/25/2009 08:27 AM    Checking BG: none Patient is currently controlled on the following medications:   No pharmacotherapy  Last diabetic eye exam:  Lab Results  Component Value Date/Time   HMDIABEYEEXA No Retinopathy 02/28/2019 12:00 AM    Last diabetic foot exam:  Lab Results  Component Value Date/Time   HMDIABFOOTEX done 12/20/2018 12:00 AM    We discussed: diet - avoids sweets, sodas; doesn't exercise regularly, when weather is good, walks a lot  Plan: Continue current medications. Increase daily exercise with goal of 30 minutes, 5 days a week.  Hypertension   Office blood pressures are  BP Readings from Last 3 Encounters:  06/18/19 (!) 142/89  05/27/19 130/70  04/23/19 128/68   CMP Latest Ref Rng & Units 12/18/2018 09/04/2018 11/02/2017  Glucose 70 - 99 mg/dL 102(H) 125(H) 108(H)  BUN 6 - 23 mg/dL 20 20 12   Creatinine 0.40 - 1.20 mg/dL 0.85 0.87 0.70  Sodium 135 - 145 mEq/L 141 138 140  Potassium 3.5 - 5.1 mEq/L 3.8 3.7 3.6  Chloride 96 - 112 mEq/L 96 93(L) 95(L)  CO2 19 - 32 mEq/L 36(H) 26 30(H)  Calcium 8.4 - 10.5 mg/dL 10.4 10.4(H) 9.8  Total Protein 6.0 - 8.3 g/dL 7.0 - -  Total Bilirubin 0.2 - 1.2 mg/dL 0.7 - -  Alkaline Phos 39 - 117 U/L 62 - -  AST 0 - 37 U/L 15 - -  ALT 0 - 35 U/L 12 - -  BP goal < 140/90 mmHg Patient has failed these meds in the past: none  Patient checks BP at home: once monthly Patient home BP readings are ranging: none about 140/90  Patient is currently controlled on the following medications:  Metoprolol succinate 50 mg - 1 tablet daily  Losartan-HCTZ 100-25 mg- 1 tablet daily  Diltiazem 120 mg - 1 tablet daily  We discussed: drug store separated HCTZ and losartan for a little while due to supply, thought it caused some dizziness, but has since attributed the dizziness to CBD supplement   Plan:  Continue current medications   Hyperlipidemia/CAD   Lipid Panel     Component Value Date/Time   CHOL 162 12/18/2018 0812   TRIG 73.0 12/18/2018 0812   HDL 62.80 12/18/2018 0812   CHOLHDL 3 12/18/2018 0812   VLDL 14.6 12/18/2018 0812   LDLCALC 85 12/18/2018 0812   LDLDIRECT 119.8 04/26/2009 0846   LDL goal < 70 Patient has failed these meds in past:  Patient is currently uncontrolled on the following medications:  Simvastatin 40 mg - once daily at bedtime  Aspirin 81 mg - once daily  We discussed: Pt reports diet has improved since August 2020. Increased vegetables, limiting red meats, avoiding eating out. We discussed diet and exercise recommendations and the potential to switch to high intensity statin in the future.  Plan: Continue current medications. May switch simvastatin to rosuvastatin 20 mg if LDL remains elevated with next lipid panel. Patient will continue to work on healthy diet  and exercise.   Mild Cognitive Impairment  Symptoms: forgetful of things like names, directions, will come to her later Patient has failed these meds in past: donepezil, galantamine Patient is currently controlled on the following medications:   Rivastigmine patch (Exelon) 4.6 mg/24hour - Apply once daily   Brain Support (OTC) - 1 tablet daily (AM)  Brain Booster (OTC) - 1 tablet daily (PM)  Prevagen OTC - no longer taking  We discussed:  Started Exelon 06/17/18 per neurology, patient is feeling much better about her memory after normal CT scan. Applies new patch to chest or arm daily, rotates sites. Denies adverse effects. About 6 months ago started Prevagen, switched to brain booster and brain support recently. Would like to know if she should continue both.  Plan: Continue Exelon patch. Recommend discontinuing brain support supplements, continuing daily multivitamin and focusing on healthy diet high in nutrients such as the DASH or mediterranean diet for preventing memory  loss.  Allergic Rhinitis  Allergens: mold, dogs, dust mites Pt report symptoms have improved recently Patient has failed these meds in past: flonase, saline spray Patient is currently controlled on the following medications:  Montelukast 10 mg - 1 tablet at bedtime  Azelastine nasal solution 0.15% - 1-2 sprays per nostril twice daily   We discussed: recently doing much better, forgets to take azelastine doing so well, cleaning sheets often  Plan: Continue current medications   Adverse effect: Dizziness   Patient is currently on the following medications: CBD energy capsule daily (ingredients: CBD 25 mg, vitamin B12 24 mcg, l-theanine100 mcg, rhodiola 100 mg, caffeine 50 mg - per capsule daily)  We discussed:  Patient stopped CBD supplement to see if dizziness would improve, has not experience dizziness since discontinuing product  Plan: Recommend carefully reading labels of CBD products for additional ingredients. Avoid this particular product due to dizziness.  Medication Management  OTCs:  Multivitamin with D3 daily  Pharmacy: [facility]; [facility]   Adherence: No concerns, does not use pill box   Affordability:  No concerns  Vaccines: COVID vaccine, influenza, PCV13, PPSV23 - up to date; due for Td, Shingrix   CCM Follow Up:  Monday, Sep 29, 2019 11:30 AM (telephone)  Debbora Dus, PharmD Clinical Pharmacist [facility] [phone]

## 2019-08-11 ENCOUNTER — Encounter: Payer: Self-pay | Admitting: *Deleted

## 2019-08-11 ENCOUNTER — Other Ambulatory Visit: Payer: Self-pay | Admitting: *Deleted

## 2019-08-11 DIAGNOSIS — R002 Palpitations: Secondary | ICD-10-CM

## 2019-08-11 NOTE — Progress Notes (Signed)
Patient ID: Janet Chapman, female   DOB: 24-Jul-1935, 84 y.o.   MRN: [id number] Patient enrolled for [employer] to ship a 30 day cardiac event monitor to her home.  Instructions sent to patient via My Charge message, and will also be included in her monitor kit.

## 2019-08-12 ENCOUNTER — Telehealth: Payer: Self-pay | Admitting: *Deleted

## 2019-08-12 NOTE — Telephone Encounter (Signed)
Noted  

## 2019-08-12 NOTE — Telephone Encounter (Signed)
Patient called wanting to know if she is due a tetanus vaccine and shingles vaccine.  Patient was given the dates that she had her last vaccines. Advised patient that her insurance will not pay for the tetanus and shingles vaccines at the office. Advised patient that [employer] will cover the vaccines at a pharmacy. Patient stated that she thinks that she may have recently had a tetanus vaccine at [facility]. Patient stated that she will call the pharmacy and talk with them about getting the vaccines at the pharmacy.  Advised patient if she has had the tetanus vaccine to get the date and let Dr. [name] know so that her chart can be updated.

## 2019-08-21 ENCOUNTER — Telehealth: Payer: Self-pay | Admitting: Neurology

## 2019-08-21 NOTE — Telephone Encounter (Signed)
Pt called back in regards to missed call  

## 2019-08-21 NOTE — Telephone Encounter (Signed)
Pt has called to report that the rivastigmine (EXELON) 4.6 mg/24hr has irritated her skin.  Pt would like a call to discuss other options

## 2019-08-21 NOTE — Telephone Encounter (Signed)
Left vm for patient to call back about possible reaction to exelon patch.

## 2019-08-25 ENCOUNTER — Other Ambulatory Visit: Payer: Self-pay

## 2019-08-25 ENCOUNTER — Encounter: Payer: Self-pay | Admitting: Allergy

## 2019-08-25 ENCOUNTER — Ambulatory Visit: Payer: PPO | Admitting: Allergy

## 2019-08-25 VITALS — BP 150/82 | HR 70 | Temp 97.8°F | Resp 18 | Ht 62.0 in

## 2019-08-25 DIAGNOSIS — J3089 Other allergic rhinitis: Secondary | ICD-10-CM

## 2019-08-25 MED ORDER — FLUTICASONE PROPIONATE 50 MCG/ACT NA SUSP: 1.0000 | Freq: Two times a day (BID) | NASAL | 5 refills | Status: DC

## 2019-08-25 MED ORDER — RIVASTIGMINE TARTRATE 1.5 MG PO CAPS: 1.5000 mg | ORAL_CAPSULE | Freq: Two times a day (BID) | ORAL | 2 refills | Status: DC

## 2019-08-25 NOTE — Telephone Encounter (Signed)
Pt called wanting to know what the update is on being able to change to another medication. Pt states that her husband has mentioned that she is regressing. Please advise.

## 2019-08-25 NOTE — Assessment & Plan Note (Signed)
Past history - Rhinitis symptoms with main complaint of postnasal drip since February 2020.  CT sinus in October 2020 showed minimal mucosal thickening and possibly a retention cyst/polyp in the right maxillary sinus.  She was evaluated by ENT. Denies heartburn/reflux symptoms. 2020 skin testing showed: Positive to molds, dog and dust mites. Interim history - symptoms improved but lately noticing some congestion. Stopped Flonase.   Continue environmental control measures.  Continue azelastine nasal spray 1-2 sprays per nostril up to twice a day for sinus drainage.  Restart fluticasone 1 spray per nostril twice a day as needed for nasal congestion.   Nasal saline spray (i.e., Simply Saline) or nasal saline lavage (i.e., NeilMed) is recommended as needed and prior to medicated nasal sprays.  May use over the counter antihistamines such as Zyrtec (cetirizine), Claritin (loratadine), Allegra (fexofenadine), or Xyzal (levocetirizine) daily as needed.  Continue montelukast 10mg  daily.

## 2019-08-25 NOTE — Telephone Encounter (Signed)
I called the patient.  The patient cannot tolerate the Exelon because of skin rash from the patch, will switch over to low-dose capsules, 1.5 mg twice daily.

## 2019-08-25 NOTE — Addendum Note (Signed)
Addended by: Kathrynn Ducking on: 08/25/2019 05:28 PM   Modules accepted: Orders

## 2019-08-25 NOTE — Patient Instructions (Addendum)
Other allergic rhinitis 2020 skin testing showed: Positive to molds, dog and dust mites.  Continue environmental control measures.  Continue azelastine nasal spray 1-2 sprays per nostril up to twice a day for sinus drainage.  Restart fluticasone 1 spray per nostril twice a day as needed for nasal congestion.   Nasal saline spray (i.e., Simply Saline) or nasal saline lavage (i.e., NeilMed) is recommended as needed and prior to medicated nasal sprays.  May use over the counter antihistamines such as Zyrtec (cetirizine), Claritin (loratadine), Allegra (fexofenadine), or Xyzal (levocetirizine) daily as needed.  Continue montelukast 10mg  daily.   Follow up in 6 months or sooner if needed.

## 2019-08-25 NOTE — Progress Notes (Signed)
Follow Up Note  RE: Janet Chapman MRN: [id number] DOB: Aug 14, 1935 Date of Office Visit: 08/25/2019  Referring provider: [name], MD Primary care provider: [name], MD  Chief Complaint: Allergic Rhinitis   History of Present Illness: I had the pleasure of seeing Janet Chapman for a follow up visit at the [facility] on 08/25/2019. She is a 84 y.o. female, who is being followed for allergic rhinitis. Her previous allergy office visit was on 04/23/2019 with Dr. Maudie Chapman. Today is a regular follow up visit.  Other allergic rhinitis Patient is doing much better since the last visit. The winter months seems to be the best for her.   Still having spells of mucous and drainage before or after eating. Denies reflux/heartburn.  Using azelastine nasal sprays if needed.  Stopped Flonase and noticing some nasal congestion.  Takes Singulair daily and no other antihistamines.   Assessment and Plan: Janet Chapman is a 84 y.o. female with: Other allergic rhinitis Past history - Rhinitis symptoms with main complaint of postnasal drip since February 2020.  CT sinus in October 2020 showed minimal mucosal thickening and possibly a retention cyst/polyp in the right maxillary sinus.  She was evaluated by ENT. Denies heartburn/reflux symptoms. 2020 skin testing showed: Positive to molds, dog and dust mites. Interim history - symptoms improved but lately noticing some congestion. Stopped Flonase.   Continue environmental control measures.  Continue azelastine nasal spray 1-2 sprays per nostril up to twice a day for sinus drainage.  Restart fluticasone 1 spray per nostril twice a day as needed for nasal congestion.   Nasal saline spray (i.e., Simply Saline) or nasal saline lavage (i.e., NeilMed) is recommended as needed and prior to medicated nasal sprays.  May use over the counter antihistamines such as Zyrtec (cetirizine), Claritin (loratadine), Allegra (fexofenadine), or Xyzal  (levocetirizine) daily as needed.  Continue montelukast 10mg  daily.   Return in about 6 months (around 02/24/2020).  Follow up with PCP regarding her blood pressure.  Meds ordered this encounter  Medications  . fluticasone (FLONASE) 50 MCG/ACT nasal spray    Sig: Place 1 spray into both nostrils in the morning and at bedtime. For nasal congestion.    Dispense:  16 g    Refill:  5   Diagnostics: None.  Medication List:  Current Outpatient Medications  Medication Sig Dispense Refill  . Apoaequorin (PREVAGEN PO) Take 1 tablet by mouth daily.    Marland Kitchen aspirin 81 MG tablet Take 81 mg by mouth at bedtime.     . Azelastine HCl 0.15 % SOLN May use 1-2 sprays per nostril up to twice a day as needed for sinus drainage. 30 mL 5  . diltiazem (CARDIZEM CD) 120 MG 24 hr capsule TAKE 1 CAPSULE BY MOUTH DAILY 90 capsule 3  . diltiazem (TIAZAC) 120 MG 24 hr capsule Take by mouth.    Marland Kitchen ketoconazole (NIZORAL) 2 % shampoo Apply 1 application topically 2 (two) times a week. 120 mL 0  . losartan-hydrochlorothiazide (HYZAAR) 100-25 MG tablet Take 1 tablet by mouth daily. 90 tablet 3  . metoprolol succinate (TOPROL-XL) 50 MG 24 hr tablet TAKE 1 TABLET BY MOUTH DAILY 90 tablet 2  . montelukast (SINGULAIR) 10 MG tablet TAKE 1 TABLET BY MOUTH AT BEDTIME 30 tablet 3  . Multiple Vitamins-Minerals (VITAMIN D3 COMPLETE PO) Take by mouth.    . NON FORMULARY daily as needed. CBD Energy Capsule    . NON FORMULARY Brain Support-2 tablets  daily    . rivastigmine (EXELON) 4.6 mg/24hr Place 1 patch (4.6 mg total) onto the skin daily. 30 patch 3  . simvastatin (ZOCOR) 40 MG tablet TAKE 1 TABLET BY MOUTH EACH NIGHT AT BEDTIME 90 tablet 3  . fluticasone (FLONASE) 50 MCG/ACT nasal spray Place 1 spray into both nostrils in the morning and at bedtime. For nasal congestion. 16 g 5   No current facility-administered medications for this visit.   Allergies: Allergies  Allergen Reactions  . Codeine Nausea Only   I reviewed  her past medical history, social history, family history, and environmental history and no significant changes have been reported from her previous visit.  Review of Systems  Constitutional: Negative for appetite change, chills, fever and unexpected weight change.  HENT: Positive for congestion and postnasal drip.   Eyes: Negative for itching.  Respiratory: Negative for cough, chest tightness, shortness of breath and wheezing.   Cardiovascular: Negative for chest pain.  Gastrointestinal: Negative for abdominal pain.  Genitourinary: Negative for difficulty urinating.  Skin: Negative for rash.  Allergic/Immunologic: Positive for environmental allergies. Negative for food allergies.  Neurological: Negative for headaches.   Objective: BP (!) 150/82 (BP Location: Left Arm, Patient Position: Sitting, Cuff Size: Normal)   Pulse 70   Temp 97.8 F (36.6 C) (Temporal)   Resp 18   Ht 5\' 2"  (1.575 m)   SpO2 96%   BMI 26.89 kg/m  Body mass index is 26.89 kg/m. Physical Exam  Constitutional: She is oriented to person, place, and time. She appears well-developed and well-nourished.  HENT:  Head: Normocephalic and atraumatic.  Right Ear: External ear normal.  Left Ear: External ear normal.  Nose: Mucosal edema present.  Mouth/Throat: Oropharynx is clear and moist.  Eyes: Conjunctivae and EOM are normal.  Cardiovascular: Normal rate, regular rhythm and normal heart sounds. Exam reveals no gallop and no friction rub.  No murmur heard. Pulmonary/Chest: Effort normal and breath sounds normal. She has no wheezes. She has no rales.  Abdominal: Soft.  Musculoskeletal:     Cervical back: Neck supple.  Neurological: She is alert and oriented to person, place, and time.  Skin: Skin is warm. No rash noted.  Psychiatric: She has a normal mood and affect. Her behavior is normal.  Nursing note and vitals reviewed.  Previous notes and tests were reviewed. The plan was reviewed with the  patient/family, and all questions/concerned were addressed.  It was my pleasure to see Janet Chapman today and participate in her care. Please feel free to contact me with any questions or concerns.  Sincerely,  [name], DO Allergy & Immunology  [facility] office: [phone] [facility] office: [place] office: [phone]

## 2019-08-28 ENCOUNTER — Telehealth: Payer: Self-pay | Admitting: Interventional Cardiology

## 2019-08-28 NOTE — Progress Notes (Signed)
Cardiology Office Note:    Date:  08/29/2019   ID:  [name], DOB [birth year], MRN [id number]  PCP:  [name], MD  Cardiologist:  No primary care provider on file.   Referring MD: [name], MD   Chief Complaint  Patient presents with  . Irregular Heart Beat    History of Present Illness:    GANIYA LIECHTI is a 84 y.o. female with a hx of  CAD, DM II, hyperlipidemia, and DM II. she is referred for cardiology consultation because of palpitations.  Starting greater than a year ago and perhaps as long ago as 4 years, she has noted occasional pounding and heavy heart beating.  This is more prevalent at night when she tries to fall asleep.  Things seem to gradually get better.  She still feels better now than she did years ago.  Over the past several days she has been having sudden palpitations at night associated with elevated blood pressures in the range of 190/100 mmHg.  No associated chest pain or dyspnea.  She feels fine today.  She has no limitations in physical activity.  Her heart is not been racing.  She has not fainted.  She denies orthopnea and lower extremity swelling.  A 30-day monitor was sent to the patient's residence but somehow she and her husband were confused about the purpose.  He was to have a sleep study at home.  He felt that the monitor was sent to him for evaluation of heart rate and rhythm while he was asleep related to sleep apnea.  Past Medical History:  Diagnosis Date  . Allergic rhinitis   . Diabetes mellitus without complication (HCC)    diet controlled  . GERD (gastroesophageal reflux disease)   . Hyperlipidemia   . Hypertension   . Memory loss   . Osteoarthritis   . Urinary incontinence     Past Surgical History:  Procedure Laterality Date  . ABDOMINAL HYSTERECTOMY  1993   TA  . CARDIOVASCULAR STRESS TEST  2004   [facility], cardiolite-normal  . CHOLECYSTECTOMY  [phone]  . COLONOSCOPY    . LEFT HEART CATH AND CORONARY ANGIOGRAPHY  N/A 11/06/2017   Procedure: LEFT HEART CATH AND CORONARY ANGIOGRAPHY;  Surgeon: Burnell Blanks, MD;  Location: [facility];  Service: Cardiovascular;  Laterality: N/A;    Current Medications: Current Meds  Medication Sig  . aspirin 81 MG tablet Take 81 mg by mouth at bedtime.   . Azelastine HCl 0.15 % SOLN May use 1-2 sprays per nostril up to twice a day as needed for sinus drainage.  . diltiazem (CARDIZEM CD) 120 MG 24 hr capsule TAKE 1 CAPSULE BY MOUTH DAILY  . fluticasone (FLONASE) 50 MCG/ACT nasal spray Place 1 spray into both nostrils in the morning and at bedtime. For nasal congestion.  Marland Kitchen ketoconazole (NIZORAL) 2 % shampoo Apply 1 application topically 2 (two) times a week.  . losartan-hydrochlorothiazide (HYZAAR) 100-25 MG tablet Take 1 tablet by mouth daily.  . metoprolol succinate (TOPROL-XL) 50 MG 24 hr tablet TAKE 1 TABLET BY MOUTH DAILY  . montelukast (SINGULAIR) 10 MG tablet TAKE 1 TABLET BY MOUTH AT BEDTIME  . rivastigmine (EXELON) 1.5 MG capsule Take 1 capsule (1.5 mg total) by mouth 2 (two) times daily.  . simvastatin (ZOCOR) 40 MG tablet TAKE 1 TABLET BY MOUTH EACH NIGHT AT BEDTIME     Allergies:   Codeine   Social History   Socioeconomic History  .  Marital status: Married    Spouse name: Not on file  . Number of children: 0  . Years of education: Not on file  . Highest education level: Not on file  Occupational History  . Occupation: retired  Tobacco Use  . Smoking status: Former Research scientist (life sciences)  . Smokeless tobacco: Never Used  . Tobacco comment: In college  Substance and Sexual Activity  . Alcohol use: Yes    Comment: 1 glass of wine per month  . Drug use: No  . Sexual activity: Never  Other Topics Concern  . Not on file  Social History Narrative    Regular exercise: yes, Curves 2x a week   Married 48 + years    Diet: fruit and veggies, no FF, some water, artificial sweetener   HCPOA: [name], has living will, full code ( reviewed 2014)             Social Determinants of Health   Financial Resource Strain:   . Difficulty of Paying Living Expenses:   Food Insecurity:   . Worried About Charity fundraiser in the Last Year:   . Arboriculturist in the Last Year:   Transportation Needs:   . Film/video editor (Medical):   Marland Kitchen Lack of Transportation (Non-Medical):   Physical Activity:   . Days of Exercise per Week:   . Minutes of Exercise per Session:   Stress:   . Feeling of Stress :   Social Connections:   . Frequency of Communication with Friends and Family:   . Frequency of Social Gatherings with Friends and Family:   . Attends Religious Services:   . Active Member of Clubs or Organizations:   . Attends Archivist Meetings:   Marland Kitchen Marital Status:      Family History: The patient's family history includes Dementia in her mother; Diabetes in an other family member; Heart attack (age of onset: 43) in her father; Ovarian cancer in some other family members; Ovarian cancer (age of onset: 68) in her sister; Stroke (age of onset: 30) in her mother. There is no history of Colon cancer.  ROS:   Please see the history of present illness.    She has had no neurological symptoms.  She is distraught that she has memory difficulty.  There is no peripheral edema.  All other systems reviewed and are negative.  EKGs/Labs/Other Studies Reviewed:    The following studies were reviewed today: CARDIAC CATH 10/2017 Diagnostic Dominance: Right  Intervention    EKG:  EKG EKG demonstrates sinus bradycardia, a single premature atrial beat, left axis deviation, poor R wave progression V1 through V4.  Low voltage is noted.  Recent Labs: 12/18/2018: ALT 12; BUN 20; Creatinine, Ser 0.85; Hemoglobin 14.3; Platelets 215.0; Potassium 3.8; Pro B Natriuretic peptide (BNP) 121.0; Sodium 141; TSH 3.92  Recent Lipid Panel    Component Value Date/Time   CHOL 162 12/18/2018 0812   TRIG 73.0 12/18/2018 0812   HDL 62.80 12/18/2018  0812   CHOLHDL 3 12/18/2018 0812   VLDL 14.6 12/18/2018 0812   LDLCALC 85 12/18/2018 0812   LDLDIRECT 119.8 04/26/2009 0846    Physical Exam:    VS:  BP (!) 148/78   Pulse (!) 54   Ht 5\' 2"  (1.575 m)   Wt 145 lb 3.2 oz (65.9 kg)   SpO2 97%   BMI 26.56 kg/m     Wt Readings from Last 3 Encounters:  08/29/19 145 lb 3.2 oz (  65.9 kg)  06/18/19 147 lb (66.7 kg)  05/27/19 146 lb 12 oz (66.6 kg)     GEN: She appears calm and compensated.. No acute distress HEENT: Normal NECK: No JVD. LYMPHATICS: No lymphadenopathy CARDIAC: Irregular rhythm with quadrigeminy without murmur, gallop, or edema. VASCULAR:  Normal Pulses. No bruits. RESPIRATORY:  Clear to auscultation without rales, wheezing or rhonchi  ABDOMEN: Soft, non-tender, non-distended, No pulsatile mass, MUSCULOSKELETAL: No deformity  SKIN: Warm and dry NEUROLOGIC:  Alert and oriented x 3 PSYCHIATRIC:  Normal affect   ASSESSMENT:    1. Coronary artery disease involving native coronary artery of native heart with unstable angina pectoris ([place])   2. Essential hypertension, benign   3. Diabetes mellitus due to underlying condition with unspecified complications ([place])   4. High cholesterol   5. Palpitations   6. Educated about COVID-19 virus infection    PLAN:    In order of problems listed above:  1. Cardiac cath performed several years ago demonstrated moderate nonobstructive coronary disease.  No particular symptoms of concern presently. 2. Blood pressure is adequate to slightly high although her target for age 33 will be 140/80 mmHg. 3. We did not address diabetes 4. LDL cholesterol is 85 in August.  No specific urgency or concern. 5. In auscultating during exam, she had quadrigeminy.  I assume this is related to PACs since a single isolated PAC was noted on EKG.  Need to exclude ventricular ectopy. 6. She has received the COVID-19 vaccine.  Social distancing and mask wearing is being observed.  Plan 30-day monitor  to exclude age-related arrhythmias such as atrial fibrillation, atrial flutter, and to quantitate premature ventricular contractions if present.   Medication Adjustments/Labs and Tests Ordered: Current medicines are reviewed at length with the patient today.  Concerns regarding medicines are outlined above.  Orders Placed This Encounter  Procedures  . Cardiac event monitor  . EKG 12-Lead   No orders of the defined types were placed in this encounter.   Patient Instructions  Medication Instructions:  Your physician recommends that you continue on your current medications as directed. Please refer to the Current Medication list given to you today.  *If you need a refill on your cardiac medications before your next appointment, please call your pharmacy*   Lab Work: None If you have labs (blood work) drawn today and your tests are completely normal, you will receive your results only by: Marland Kitchen MyChart Message (if you have MyChart) OR . A paper copy in the mail If you have any lab test that is abnormal or we need to change your treatment, we will call you to review the results.   Testing/Procedures: None   Follow-Up: At [facility], you and your health needs are our priority.  As part of our continuing mission to provide you with exceptional heart care, we have created designated Provider Care Teams.  These Care Teams include your primary Cardiologist (physician) and Advanced Practice Providers (APPs -  Physician Assistants and Nurse Practitioners) who all work together to provide you with the care you need, when you need it.  We recommend signing up for the patient portal called "MyChart".  Sign up information is provided on this After Visit Summary.  MyChart is used to connect with patients for Virtual Visits (Telemedicine).  Patients are able to view lab/test results, encounter notes, upcoming appointments, etc.  Non-urgent messages can be sent to your provider as well.   To learn  more about what you can do  with MyChart, go to [email or web address].    Your next appointment:   As needed  The format for your next appointment:   In Person  Provider:   You may see Dr. Daneen Schick or one of the following Advanced Practice Providers on your designated Care Team:    Truitt Merle, NP  Cecilie Kicks, NP  Kathyrn Drown, NP    Other Instructions      Signed, Sinclair Grooms, MD  08/29/2019 5:23 PM    [place]

## 2019-08-28 NOTE — Telephone Encounter (Signed)
I spoke with patient's husband. He reports patient is changing providers to Dr [name] and has appointment on 5/11.  Monitor was ordered by Dr [name] for patient due to her palpitations but husband reports he thought it was for him when it arrived in the mail and he wore monitor.  They are working this out with the monitor company and he is not sure if new monitor is being sent. Husband reports patient had palpitations throughout the night. Similar to what she was having when monitor was ordered. Palpitations have improved this morning. BP was 192/108 earlier this morning prior to taking AM medications.  Patient has since taken AM medications and last BP was 173/89.  Heart rate 49.  Patient is not having any dizziness or other complaints.  Husband is requesting sooner appointment for patient. I scheduled patient to see Dr [name] tomorrow at 4:20

## 2019-08-28 NOTE — Telephone Encounter (Signed)
Pt c/o BP issue: STAT if pt c/o blurred vision, one-sided weakness or slurred speech  1. What are your last 5 BP readings? 192/108  2. Are you having any other symptoms (ex. Dizziness, headache, blurred vision, passed out)? No   3. What is your BP issue? Randall Hiss is calling requesting a sooner appointment with Dr. [name] for his wife Janet Chapman.  I advised Eric Dr. [name] does not have any sooner New Patient appointments at this time. He states she was up all night with palpitations and hypertension. Her BP was 192/108, but no other symptoms are present. Please advise.

## 2019-08-29 ENCOUNTER — Ambulatory Visit: Payer: PPO | Admitting: Interventional Cardiology

## 2019-08-29 ENCOUNTER — Other Ambulatory Visit: Payer: Self-pay

## 2019-08-29 ENCOUNTER — Encounter: Payer: Self-pay | Admitting: Interventional Cardiology

## 2019-08-29 VITALS — BP 148/78 | HR 54 | Ht 62.0 in | Wt 145.2 lb

## 2019-08-29 DIAGNOSIS — Z7189 Other specified counseling: Secondary | ICD-10-CM

## 2019-08-29 DIAGNOSIS — I25110 Atherosclerotic heart disease of native coronary artery with unstable angina pectoris: Secondary | ICD-10-CM

## 2019-08-29 DIAGNOSIS — R002 Palpitations: Secondary | ICD-10-CM | POA: Diagnosis not present

## 2019-08-29 DIAGNOSIS — E088 Diabetes mellitus due to underlying condition with unspecified complications: Secondary | ICD-10-CM | POA: Diagnosis not present

## 2019-08-29 DIAGNOSIS — E7800 Pure hypercholesterolemia, unspecified: Secondary | ICD-10-CM

## 2019-08-29 DIAGNOSIS — I10 Essential (primary) hypertension: Secondary | ICD-10-CM | POA: Diagnosis not present

## 2019-08-29 NOTE — Patient Instructions (Signed)

## 2019-08-31 NOTE — Telephone Encounter (Signed)
Noted  

## 2019-09-01 ENCOUNTER — Encounter: Payer: Self-pay | Admitting: *Deleted

## 2019-09-01 ENCOUNTER — Other Ambulatory Visit: Payer: Self-pay | Admitting: Family Medicine

## 2019-09-01 NOTE — Progress Notes (Signed)
Patient ID: Janet Chapman, female   DOB: [birth year], 84 y.o.   MRN: [id number] Patient re-enrolled for a second [employer] cardiac event monitor to be shipped to her home.  Patient confusion regarding first monitor shipped 08/11/2019 / monitor not applied.

## 2019-09-04 ENCOUNTER — Ambulatory Visit (INDEPENDENT_AMBULATORY_CARE_PROVIDER_SITE_OTHER): Payer: PPO

## 2019-09-04 DIAGNOSIS — R002 Palpitations: Secondary | ICD-10-CM

## 2019-09-22 ENCOUNTER — Telehealth: Payer: Self-pay | Admitting: Interventional Cardiology

## 2019-09-22 ENCOUNTER — Encounter: Payer: Self-pay | Admitting: Cardiology

## 2019-09-22 NOTE — Telephone Encounter (Signed)
Spoke with husband, DPR on file.  He was calling to make sure I was aware that pt's appt had been moved since she is still wearing the monitor.  Moved appt up.  Husband appreciative for call.

## 2019-09-22 NOTE — Telephone Encounter (Signed)
  Husband would like for Janet Chapman to call him back because he has some questions. Wife is wearing a heart monitor at this time.

## 2019-09-22 NOTE — Telephone Encounter (Signed)
Error

## 2019-09-23 ENCOUNTER — Ambulatory Visit: Payer: PPO | Admitting: Interventional Cardiology

## 2019-09-26 NOTE — Chronic Care Management (AMB) (Deleted)
Chronic Care Management Pharmacy  Name: NATALIYAH LASZEWSKI  MRN: [id number] DOB: [birth year]  Chief Complaint/ HPI  Janet Chapman,  84 y.o., female presents for their Initial CCM visit with the clinical pharmacist via telephone.  PCP : [name], MD   Patient concerns: would like to know which memory supplement is the best  Their chronic conditions include: HTN, DM, CAD, allergic rhinitis, GERD, chronic pain, OA, mild cognitive impairment  Office Visits:  05/27/19: [name], memory loss, dizziness, fatigue - possilbly medication induced, noticed ever since separating HCTZ from combo to two separate drugs, CBD oil for pain, allergies well controlled, itchy scalp, referral to neurology for memory  Consult Visit:  08/29/19: Cardiology - cont current meds  08/25/19: Allergic rhinitis - cont current meds  08/21/19: unable to tolerate Exelon patch due to skin rash, switched to 1.5 mg capsule BID   06/29/19: [name], neurology - CT of head normal for age, trial Exelon patch  04/23/19: [name], Allergy - post nasal drip doing much better with azelastine BID, stopped flonase, Singulair daily, stopped antihistamine, still some sneezing and congestion (allergic to molds, dog, dust mites) - continue current medications  04/03/19: [name], cardiology - no medication changes  Allergies  Allergen Reactions  . Codeine Nausea Only   Medications: Outpatient Encounter Medications as of 09/29/2019  Medication Sig  . aspirin 81 MG tablet Take 81 mg by mouth at bedtime.   . Azelastine HCl 0.15 % SOLN May use 1-2 sprays per nostril up to twice a day as needed for sinus drainage.  . diltiazem (CARDIZEM CD) 120 MG 24 hr capsule TAKE 1 CAPSULE BY MOUTH DAILY  . fluticasone (FLONASE) 50 MCG/ACT nasal spray Place 1 spray into both nostrils in the morning and at bedtime. For nasal congestion.  Marland Kitchen ketoconazole (NIZORAL) 2 % shampoo Apply 1 application topically 2 (two) times a week.  .  losartan-hydrochlorothiazide (HYZAAR) 100-25 MG tablet Take 1 tablet by mouth daily.  . metoprolol succinate (TOPROL-XL) 50 MG 24 hr tablet TAKE 1 TABLET BY MOUTH DAILY  . montelukast (SINGULAIR) 10 MG tablet TAKE 1 TABLET BY MOUTH AT BEDTIME  . rivastigmine (EXELON) 1.5 MG capsule Take 1 capsule (1.5 mg total) by mouth 2 (two) times daily.  . simvastatin (ZOCOR) 40 MG tablet TAKE 1 TABLET BY MOUTH EACH NIGHT AT BEDTIME   No facility-administered encounter medications on file as of 09/29/2019.   Current Diagnosis/Assessment: Goals    . Patient Stated (pt-stated)     Starting 11/27/2016, I will continue reading daily to keep my brain sharp.    . Patient Stated     Starting 12/04/2017, I will continue to take medications as prescribed.     . Pharmacy Care Plan     Current Barriers:  . Chronic Disease Management support, education, and care coordination needs related to HTN, DM, CAD, allergic rhinitis, GERD, chronic pain, OA, mild cognitive impairment  Pharmacist Clinical Goal(s):  Marland Kitchen Achieve cholesterol goal of LDL < 70 mg/dL. Increase daily exercise with goal of 30 minutes, 5 days a week. Continue to eat a heart heathy diet high in lean meats and vegetables. . Prevent memory decline and assess appropriateness of brain supplements. I have reviewed the Brain Support and Brain Boost supplements. It seems many of the ingredients overlap with your daily multivitamin. There is little research to support these supplements. I would recommend discontinuing both brain supplements and continuing your daily multivitamin while focusing on healthy diet high in  nutrients such as the DASH or mediterranean diet for preventing memory loss. . Remain up to date of vaccinations. Recommend Tetanus booster and Shingles vaccine.  Interventions: . Comprehensive medication review performed.  Patient Self Care Activities:  . Takes medications as prescribed.  Initial goal documentation       Diabetes   Recent  Relevant Labs: Lab Results  Component Value Date/Time   HGBA1C 6.4 12/18/2018 08:12 AM   HGBA1C 6.3 12/04/2017 09:09 AM   MICROALBUR 0.6 05/18/2010 09:38 AM   MICROALBUR 1.3 01/25/2009 08:27 AM    Checking BG: none Patient is currently controlled on the following medications:   No pharmacotherapy  Last diabetic eye exam:  Lab Results  Component Value Date/Time   HMDIABEYEEXA No Retinopathy 02/28/2019 12:00 AM    Last diabetic foot exam:  Lab Results  Component Value Date/Time   HMDIABFOOTEX done 12/20/2018 12:00 AM    We discussed: diet - avoids sweets, sodas; doesn't exercise regularly, when weather is good, walks a lot  Plan: Continue current medications. Increase daily exercise with goal of 30 minutes, 5 days a week.  Hypertension   Office blood pressures are  BP Readings from Last 3 Encounters:  08/29/19 (!) 148/78  08/25/19 (!) 150/82  06/18/19 (!) 142/89   CMP Latest Ref Rng & Units 12/18/2018 09/04/2018 11/02/2017  Glucose 70 - 99 mg/dL 102(H) 125(H) 108(H)  BUN 6 - 23 mg/dL 20 20 12   Creatinine 0.40 - 1.20 mg/dL 0.85 0.87 0.70  Sodium 135 - 145 mEq/L 141 138 140  Potassium 3.5 - 5.1 mEq/L 3.8 3.7 3.6  Chloride 96 - 112 mEq/L 96 93(L) 95(L)  CO2 19 - 32 mEq/L 36(H) 26 30(H)  Calcium 8.4 - 10.5 mg/dL 10.4 10.4(H) 9.8  Total Protein 6.0 - 8.3 g/dL 7.0 - -  Total Bilirubin 0.2 - 1.2 mg/dL 0.7 - -  Alkaline Phos 39 - 117 U/L 62 - -  AST 0 - 37 U/L 15 - -  ALT 0 - 35 U/L 12 - -  BP goal < 140/90 mmHg Patient has failed these meds in the past: none  Patient checks BP at home: once monthly Patient home BP readings are ranging: none about 140/90  Patient is currently controlled on the following medications:  Metoprolol succinate 50 mg - 1 tablet daily  Losartan-HCTZ 100-25 mg- 1 tablet daily  Diltiazem 120 mg - 1 tablet daily  We discussed: drug store separated HCTZ and losartan for a little while due to supply, thought it caused some dizziness, but has since  attributed the dizziness to CBD supplement   Plan: Continue current medications   Hyperlipidemia/CAD   Lipid Panel     Component Value Date/Time   CHOL 162 12/18/2018 0812   TRIG 73.0 12/18/2018 0812   HDL 62.80 12/18/2018 0812   CHOLHDL 3 12/18/2018 0812   VLDL 14.6 12/18/2018 0812   LDLCALC 85 12/18/2018 0812   LDLDIRECT 119.8 04/26/2009 0846   LDL goal < 70 Patient has failed these meds in past:  Patient is currently uncontrolled on the following medications:  Simvastatin 40 mg - once daily at bedtime  Aspirin 81 mg - once daily  We discussed: Pt reports diet has improved since August 2020. Increased vegetables, limiting red meats, avoiding eating out. We discussed diet and exercise recommendations and the potential to switch to high intensity statin in the future.  Plan: Continue current medications. May switch simvastatin to rosuvastatin 20 mg if LDL remains elevated with next lipid  panel. Patient will continue to work on healthy diet and exercise.   Mild Cognitive Impairment  Symptoms: forgetful of things like names, directions, will come to her later Patient has failed these meds in past: donepezil, galantamine Patient is currently controlled on the following medications:   Rivastigmine patch (Exelon) 4.6 mg/24hour - Apply once daily   Brain Support (OTC) - 1 tablet daily (AM)  Brain Booster (OTC) - 1 tablet daily (PM)  Prevagen OTC - no longer taking  We discussed:  Started Exelon 06/17/18 per neurology, patient is feeling much better about her memory after normal CT scan. Applies new patch to chest or arm daily, rotates sites. Denies adverse effects. About 6 months ago started Prevagen, switched to brain booster and brain support recently. Would like to know if she should continue both.  Plan: Continue Exelon patch. Recommend discontinuing brain support supplements, continuing daily multivitamin and focusing on healthy diet high in nutrients such as the DASH or  mediterranean diet for preventing memory loss.  Allergic Rhinitis  Allergens: mold, dogs, dust mites Pt report symptoms have improved recently Patient has failed these meds in past: flonase, saline spray Patient is currently controlled on the following medications:  Montelukast 10 mg - 1 tablet at bedtime  Azelastine nasal solution 0.15% - 1-2 sprays per nostril twice daily   We discussed: recently doing much better, forgets to take azelastine doing so well, cleaning sheets often  Plan: Continue current medications   Adverse effect: Dizziness   Patient is currently on the following medications: CBD energy capsule daily (ingredients: CBD 25 mg, vitamin B12 24 mcg, l-theanine100 mcg, rhodiola 100 mg, caffeine 50 mg - per capsule daily)  We discussed:  Patient stopped CBD supplement to see if dizziness would improve, has not experienced dizziness since discontinuing product  Plan: Recommend carefully reading labels of CBD products for additional ingredients. Avoid this particular product due to dizziness.  Medication Management  OTCs:  Multivitamin with D3 daily  Pharmacy: [facility]; [facility]   Adherence: No concerns, does not use pill box   Affordability:  No concerns  Vaccines: COVID vaccine, influenza, PCV13, PPSV23 - up to date; due for Td, Shingrix   CCM Follow Up:  Monday, Sep 29, 2019 11:30 AM (telephone)  Debbora Dus, PharmD Clinical Pharmacist [facility] [phone]

## 2019-09-29 ENCOUNTER — Telehealth: Payer: PPO

## 2019-10-01 ENCOUNTER — Telehealth: Payer: Self-pay | Admitting: Interventional Cardiology

## 2019-10-01 NOTE — Telephone Encounter (Signed)
Pts husband, Linus Orn is calling in to let us know that the patient has been wearing the heart monitor for 27 days but can no longer wear it anymore because her skin is raw d/t the adhesive. I will route to Dr. [name] and monitor team as Juluis Rainier.

## 2019-10-01 NOTE — Telephone Encounter (Signed)
New Message    Pts husband is calling and would like for Anderson Malta to call him back, he says he has some questions with the heart monitor     Please call  Back

## 2019-10-05 NOTE — Telephone Encounter (Signed)
Okay to stop

## 2019-10-13 ENCOUNTER — Other Ambulatory Visit: Payer: Self-pay | Admitting: Family Medicine

## 2019-10-16 ENCOUNTER — Other Ambulatory Visit: Payer: Self-pay | Admitting: Family Medicine

## 2019-10-20 ENCOUNTER — Other Ambulatory Visit: Payer: Self-pay | Admitting: Family Medicine

## 2019-10-22 ENCOUNTER — Other Ambulatory Visit: Payer: Self-pay | Admitting: Family Medicine

## 2019-10-22 MED ORDER — LOSARTAN POTASSIUM-HCTZ 100-25 MG PO TABS: 1.0000 | ORAL_TABLET | Freq: Every day | ORAL | 1 refills | Status: DC

## 2019-10-22 NOTE — Telephone Encounter (Signed)
Mrs. Janet Chapman sent fax  stating she needed a refill on her Cozaar/HCTZ.  Patient is currently on Losartan-HCTZ 100-25 mg.  Refills for Losartan-HCTZ medication sent to [facility].

## 2019-10-22 NOTE — Addendum Note (Signed)
Addended by: Carter Kitten on: 10/22/2019 03:17 PM   Modules accepted: Orders

## 2019-10-22 NOTE — Telephone Encounter (Signed)
Left message for Mrs. Mask to return my call.  I needs to verify if she is taking losartan 100 mg or losartan-hctz 100-25 mg.

## 2019-10-30 ENCOUNTER — Telehealth: Payer: Self-pay | Admitting: Interventional Cardiology

## 2019-10-30 NOTE — Telephone Encounter (Signed)
New Message   Patient is calling because she would like to bring her spouse with her to her appt. Please advise.

## 2019-10-30 NOTE — Telephone Encounter (Signed)
Called and spoke to patient. She is asking if her husband can come with her to her appointment. She states that he was allowed to come with her last time. Made her aware that I will make a note that he can come up with her.

## 2019-10-30 NOTE — Progress Notes (Signed)
Cardiology Office Note:    Date:  11/04/2019   ID:  Janet Chapman, DOB [birth year], MRN [id number]  PCP:  [name], MD  Cardiologist:  Sinclair Grooms, MD   Referring MD: [name], MD   Chief Complaint  Patient presents with  . Coronary Artery Disease  . Atrial Fibrillation    History of Present Illness:    Janet Chapman is a 84 y.o. female with a hx of CAD, DM II, hyperlipidemia, and DM II, and continuous monitor showing bradycardia and frequent PACs without atrial fibrillation.  As last seen earlier this spring she was having palpitations.  This led to a 4-week monitor which demonstrated findings as noted below but no atrial fibrillation.  She is also relatively asymptomatic from the standpoint of palpitations at this time.  We did note significant bradycardia during the monitor.  Past Medical History:  Diagnosis Date  . Allergic rhinitis   . Diabetes mellitus without complication (HCC)    diet controlled  . GERD (gastroesophageal reflux disease)   . Hyperlipidemia   . Hypertension   . Memory loss   . Osteoarthritis   . Urinary incontinence     Past Surgical History:  Procedure Laterality Date  . ABDOMINAL HYSTERECTOMY  1993   TA  . CARDIOVASCULAR STRESS TEST  2004   H. Jennylee Uehara, cardiolite-normal  . CHOLECYSTECTOMY  [phone]  . COLONOSCOPY    . LEFT HEART CATH AND CORONARY ANGIOGRAPHY N/A 11/06/2017   Procedure: LEFT HEART CATH AND CORONARY ANGIOGRAPHY;  Surgeon: Burnell Blanks, MD;  Location: [facility];  Service: Cardiovascular;  Laterality: N/A;    Current Medications: Current Meds  Medication Sig  . aspirin 81 MG tablet Take 81 mg by mouth at bedtime.   . Azelastine HCl 0.15 % SOLN May use 1-2 sprays per nostril up to twice a day as needed for sinus drainage.  . fluticasone (FLONASE) 50 MCG/ACT nasal spray Place 1 spray into both nostrils in the morning and at bedtime. For nasal congestion.  Marland Kitchen ketoconazole (NIZORAL) 2 % shampoo  Apply 1 application topically 2 (two) times a week.  . losartan-hydrochlorothiazide (HYZAAR) 100-25 MG tablet Take 1 tablet by mouth daily.  . metoprolol succinate (TOPROL-XL) 50 MG 24 hr tablet TAKE 1 TABLET BY MOUTH DAILY  . montelukast (SINGULAIR) 10 MG tablet TAKE 1 TABLET BY MOUTH AT BEDTIME  . rivastigmine (EXELON) 1.5 MG capsule Take 1 capsule (1.5 mg total) by mouth 2 (two) times daily.  . simvastatin (ZOCOR) 40 MG tablet TAKE 1 TABLET BY MOUTH EACH NIGHT AT BEDTIME  . [DISCONTINUED] diltiazem (CARDIZEM CD) 120 MG 24 hr capsule TAKE 1 CAPSULE BY MOUTH DAILY     Allergies:   Codeine   Social History   Socioeconomic History  . Marital status: Married    Spouse name: Not on file  . Number of children: 0  . Years of education: Not on file  . Highest education level: Not on file  Occupational History  . Occupation: retired  Tobacco Use  . Smoking status: Former Research scientist (life sciences)  . Smokeless tobacco: Never Used  . Tobacco comment: In college  Vaping Use  . Vaping Use: Never used  Substance and Sexual Activity  . Alcohol use: Yes    Comment: 1 glass of wine per month  . Drug use: No  . Sexual activity: Never  Other Topics Concern  . Not on file  Social History Narrative    Regular exercise:  yes, Curves 2x a week   Married 48 + years    Diet: fruit and veggies, no FF, some water, artificial sweetener   HCPOA: [name], has living will, full code ( reviewed 2014)            Social Determinants of Health   Financial Resource Strain:   . Difficulty of Paying Living Expenses:   Food Insecurity:   . Worried About Charity fundraiser in the Last Year:   . Arboriculturist in the Last Year:   Transportation Needs:   . Film/video editor (Medical):   Marland Kitchen Lack of Transportation (Non-Medical):   Physical Activity:   . Days of Exercise per Week:   . Minutes of Exercise per Session:   Stress:   . Feeling of Stress :   Social Connections:   . Frequency of Communication with  Friends and Family:   . Frequency of Social Gatherings with Friends and Family:   . Attends Religious Services:   . Active Member of Clubs or Organizations:   . Attends Archivist Meetings:   Marland Kitchen Marital Status:      Family History: The patient's family history includes Dementia in her mother; Diabetes in an other family member; Heart attack (age of onset: 71) in her father; Ovarian cancer in some other family members; Ovarian cancer (age of onset: 66) in her sister; Stroke (age of onset: 44) in her mother. There is no history of Colon cancer.  ROS:   Please see the history of present illness.    Losing weight.  All other systems reviewed and are negative.  EKGs/Labs/Other Studies Reviewed:    The following studies were reviewed today: Continuous monitor completed in June 2021: Study Highlights   Basic underlying heart rhythm is sinus bradycardia with an average heart rate of 58 bpm and heart rate range 40 to 93 bpm.  PACs are noted.  Occasional first-degree AV block is noted.  No atrial fibrillation  EKG:  EKG not repeated.  Recent Labs: 12/18/2018: ALT 12; BUN 20; Creatinine, Ser 0.85; Hemoglobin 14.3; Platelets 215.0; Potassium 3.8; Pro B Natriuretic peptide (BNP) 121.0; Sodium 141; TSH 3.92  Recent Lipid Panel    Component Value Date/Time   CHOL 162 12/18/2018 0812   TRIG 73.0 12/18/2018 0812   HDL 62.80 12/18/2018 0812   CHOLHDL 3 12/18/2018 0812   VLDL 14.6 12/18/2018 0812   LDLCALC 85 12/18/2018 0812   LDLDIRECT 119.8 04/26/2009 0846    Physical Exam:    VS:  BP 138/68   Pulse (!) 49   Ht 5\' 2"  (1.575 m)   Wt 140 lb (63.5 kg)   SpO2 98%   BMI 25.61 kg/m     Wt Readings from Last 3 Encounters:  11/04/19 140 lb (63.5 kg)  08/29/19 145 lb 3.2 oz (65.9 kg)  06/18/19 147 lb (66.7 kg)     GEN: Elderly.  7 pound weight loss since February.. No acute distress HEENT: Normal NECK: No JVD. LYMPHATICS: No lymphadenopathy CARDIAC:  RRR without murmur,  gallop, or edema. VASCULAR:  Normal Pulses. No bruits. RESPIRATORY:  Clear to auscultation without rales, wheezing or rhonchi  ABDOMEN: Soft, non-tender, non-distended, No pulsatile mass, MUSCULOSKELETAL: No deformity  SKIN: Warm and dry NEUROLOGIC:  Alert and oriented x 3 PSYCHIATRIC:  Normal affect   ASSESSMENT:    1. Slow heart rate   2. Coronary artery disease involving native coronary artery of native heart with unstable angina  pectoris ([place])   3. Essential hypertension, benign   4. Diabetes mellitus due to underlying condition, controlled, with diabetic nephropathy, without long-term current use of insulin ([place])   5. High cholesterol   6. Palpitations   7. Weight loss    PLAN:    In order of problems listed above:  1. Discontinue diltiazem.  Monitor blood pressure 2-3 times per week 2 to 3 hours after a.m. medications and report.  May need to add amlodipine to help with blood pressure if it significantly increases above 140/80 mmHg. 2. Denies angina.  Continue primary prevention. 3. She will monitor blood pressures at home.  May need to have amlodipine added now to we have discontinued diltiazem because of bradycardia. 4. We did not discuss her diabetes 5. We did not discuss hyperlipidemia.  She should continue Zocor. 6. She has PACs on monitor but no evidence of atrial fib. 7. She has had a gradual 7 pound weight loss over the past 4 months.  I recommended that she discuss this with her primary physician.  As needed follow-up   Medication Adjustments/Labs and Tests Ordered: Current medicines are reviewed at length with the patient today.  Concerns regarding medicines are outlined above.  No orders of the defined types were placed in this encounter.  No orders of the defined types were placed in this encounter.   Patient Instructions  Medication Instructions:  1) DISCONTINUE Diltiazem  *If you need a refill on your cardiac medications before your next appointment,  please call your pharmacy*   Lab Work: None If you have labs (blood work) drawn today and your tests are completely normal, you will receive your results only by: Marland Kitchen MyChart Message (if you have MyChart) OR . A paper copy in the mail If you have any lab test that is abnormal or we need to change your treatment, we will call you to review the results.   Testing/Procedures: None   Follow-Up: At [facility], you and your health needs are our priority.  As part of our continuing mission to provide you with exceptional heart care, we have created designated Provider Care Teams.  These Care Teams include your primary Cardiologist (physician) and Advanced Practice Providers (APPs -  Physician Assistants and Nurse Practitioners) who all work together to provide you with the care you need, when you need it.  We recommend signing up for the patient portal called "MyChart".  Sign up information is provided on this After Visit Summary.  MyChart is used to connect with patients for Virtual Visits (Telemedicine).  Patients are able to view lab/test results, encounter notes, upcoming appointments, etc.  Non-urgent messages can be sent to your provider as well.   To learn more about what you can do with MyChart, go to [email or web address].    Your next appointment:   As needed  The format for your next appointment:   In Person  Provider:   You may see Sinclair Grooms, MD or one of the following Advanced Practice Providers on your designated Care Team:    Truitt Merle, NP  Cecilie Kicks, NP  Kathyrn Drown, NP    Other Instructions  Monitor your blood pressure 3-4 times per week for the next 2-3 weeks and contact our office with those readings.  Make sure you wait until at least 2-3 hours after medications before checking blood pressure.  Please contact your Primary Care physician about weight loss.      Signed, Sinclair Grooms, MD  11/04/2019 3:56 PM    [facility]

## 2019-11-04 ENCOUNTER — Encounter: Payer: Self-pay | Admitting: Interventional Cardiology

## 2019-11-04 ENCOUNTER — Ambulatory Visit: Payer: PPO | Admitting: Interventional Cardiology

## 2019-11-04 ENCOUNTER — Other Ambulatory Visit: Payer: Self-pay

## 2019-11-04 VITALS — BP 138/68 | HR 49 | Ht 62.0 in | Wt 140.0 lb

## 2019-11-04 DIAGNOSIS — I10 Essential (primary) hypertension: Secondary | ICD-10-CM

## 2019-11-04 DIAGNOSIS — E7800 Pure hypercholesterolemia, unspecified: Secondary | ICD-10-CM | POA: Diagnosis not present

## 2019-11-04 DIAGNOSIS — I25110 Atherosclerotic heart disease of native coronary artery with unstable angina pectoris: Secondary | ICD-10-CM

## 2019-11-04 DIAGNOSIS — E0821 Diabetes mellitus due to underlying condition with diabetic nephropathy: Secondary | ICD-10-CM | POA: Diagnosis not present

## 2019-11-04 DIAGNOSIS — R634 Abnormal weight loss: Secondary | ICD-10-CM | POA: Diagnosis not present

## 2019-11-04 DIAGNOSIS — R002 Palpitations: Secondary | ICD-10-CM

## 2019-11-04 DIAGNOSIS — R001 Bradycardia, unspecified: Secondary | ICD-10-CM

## 2019-11-04 NOTE — Patient Instructions (Signed)
Medication Instructions:  1) DISCONTINUE Diltiazem  *If you need a refill on your cardiac medications before your next appointment, please call your pharmacy*   Lab Work: None If you have labs (blood work) drawn today and your tests are completely normal, you will receive your results only by: Marland Kitchen MyChart Message (if you have MyChart) OR . A paper copy in the mail If you have any lab test that is abnormal or we need to change your treatment, we will call you to review the results.   Testing/Procedures: None   Follow-Up: At [facility], you and your health needs are our priority.  As part of our continuing mission to provide you with exceptional heart care, we have created designated Provider Care Teams.  These Care Teams include your primary Cardiologist (physician) and Advanced Practice Providers (APPs -  Physician Assistants and Nurse Practitioners) who all work together to provide you with the care you need, when you need it.  We recommend signing up for the patient portal called "MyChart".  Sign up information is provided on this After Visit Summary.  MyChart is used to connect with patients for Virtual Visits (Telemedicine).  Patients are able to view lab/test results, encounter notes, upcoming appointments, etc.  Non-urgent messages can be sent to your provider as well.   To learn more about what you can do with MyChart, go to [email or web address].    Your next appointment:   As needed  The format for your next appointment:   In Person  Provider:   You may see Sinclair Grooms, MD or one of the following Advanced Practice Providers on your designated Care Team:    Truitt Merle, NP  Cecilie Kicks, NP  Kathyrn Drown, NP    Other Instructions  Monitor your blood pressure 3-4 times per week for the next 2-3 weeks and contact our office with those readings.  Make sure you wait until at least 2-3 hours after medications before checking blood pressure.  Please contact  your Primary Care physician about weight loss.

## 2019-11-10 ENCOUNTER — Other Ambulatory Visit: Payer: Self-pay | Admitting: Neurology

## 2019-11-16 IMAGING — DX DG SHOULDER 2+V*R*
3 series · 3 of 3 positions shown · non-contrast
Comparison: None.

CLINICAL DATA: Right shoulder pain for week.

EXAM:
RIGHT SHOULDER - 2+ VIEW

[shoulder axial]
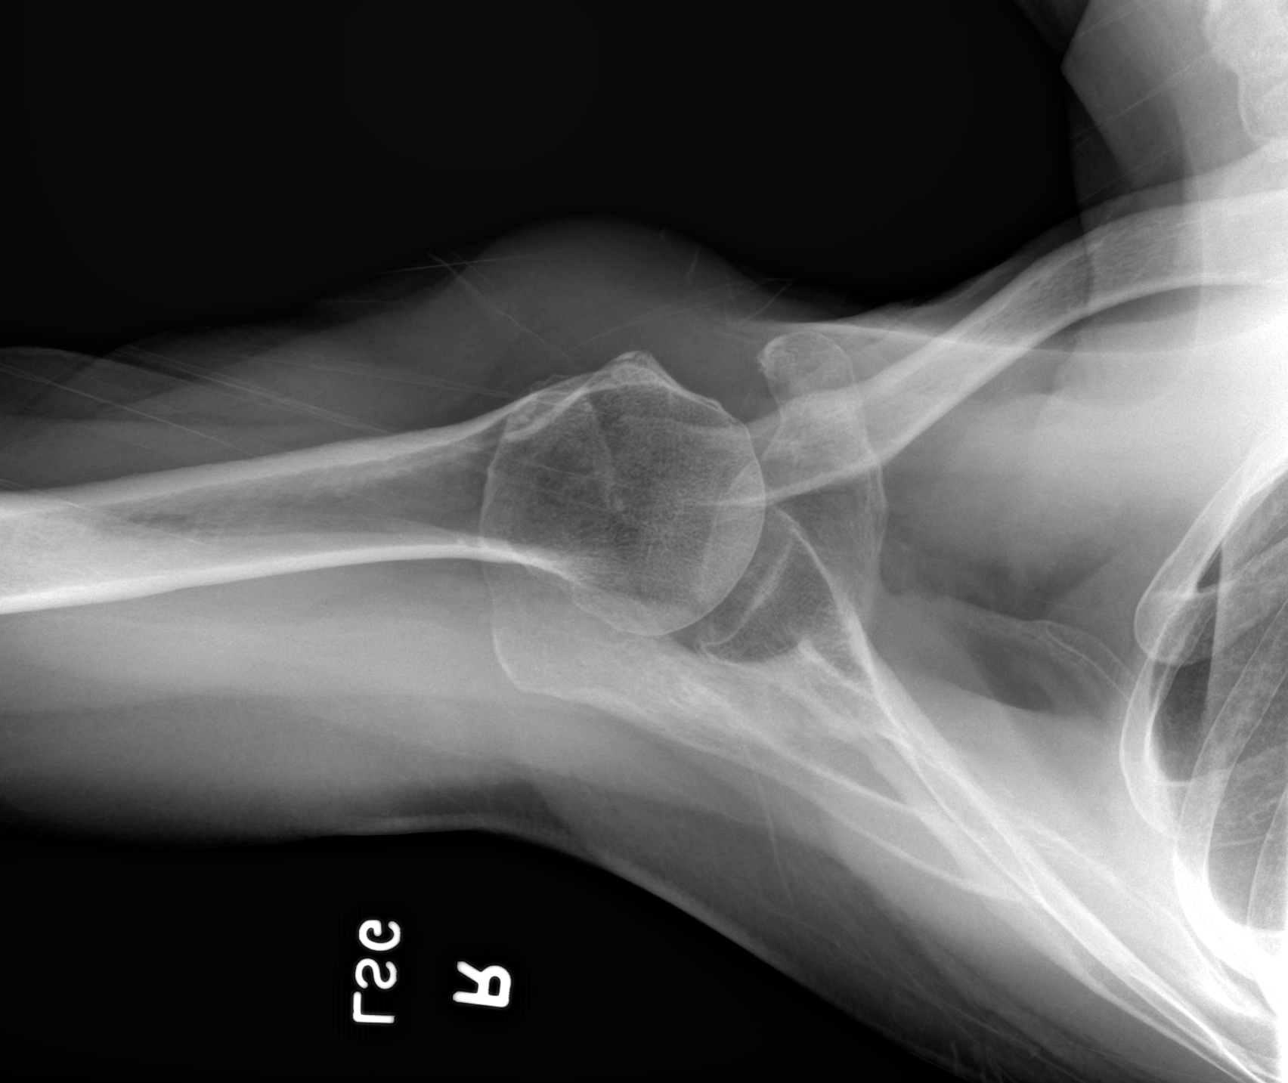

[shoulder ap]
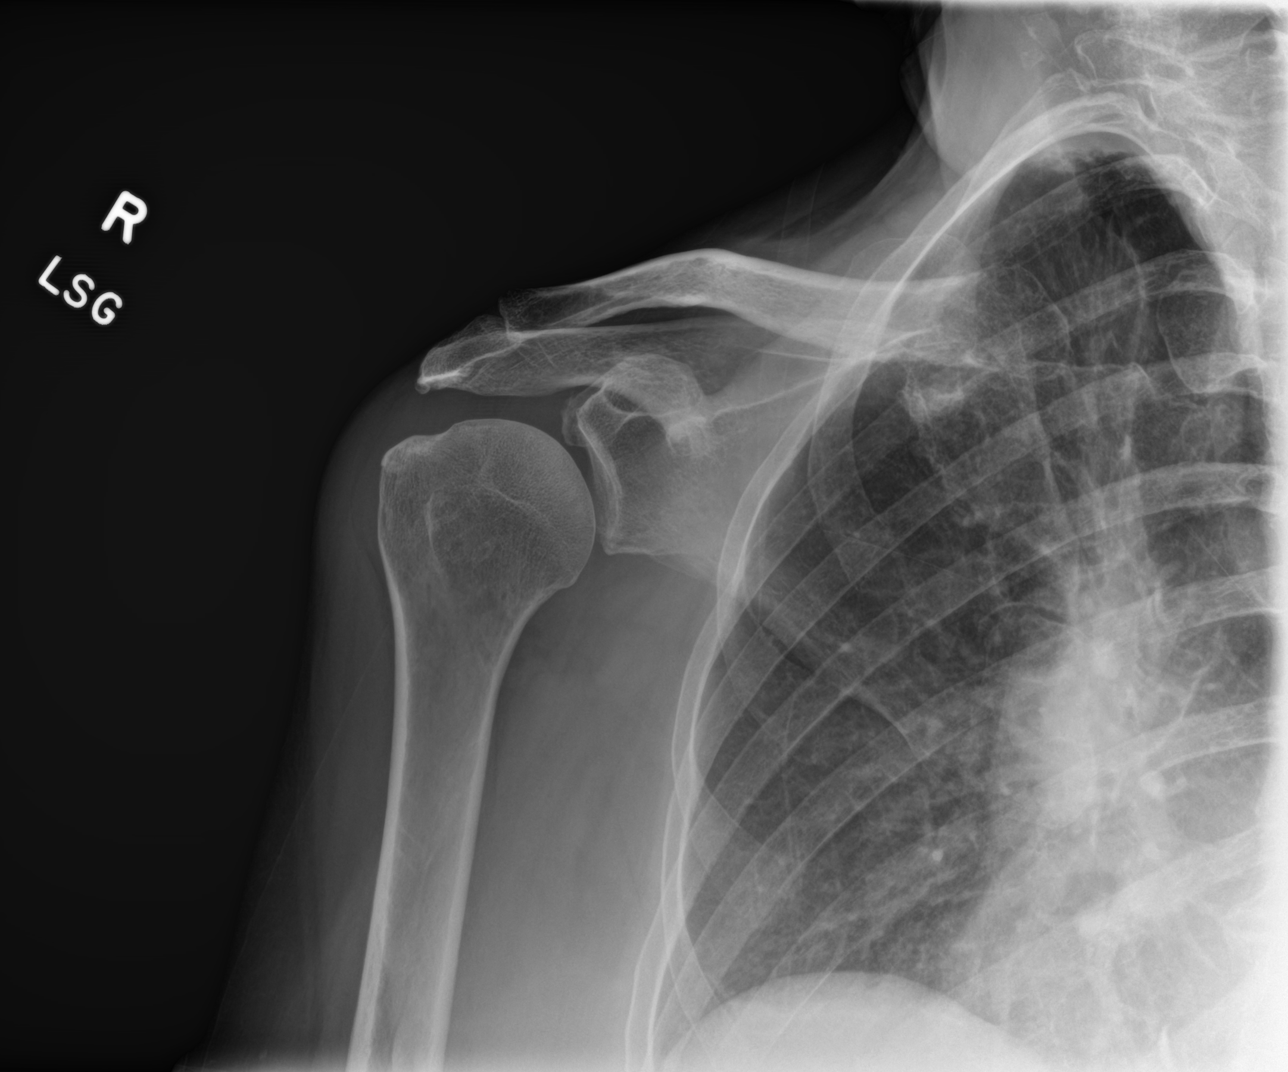

[shoulder y-view]
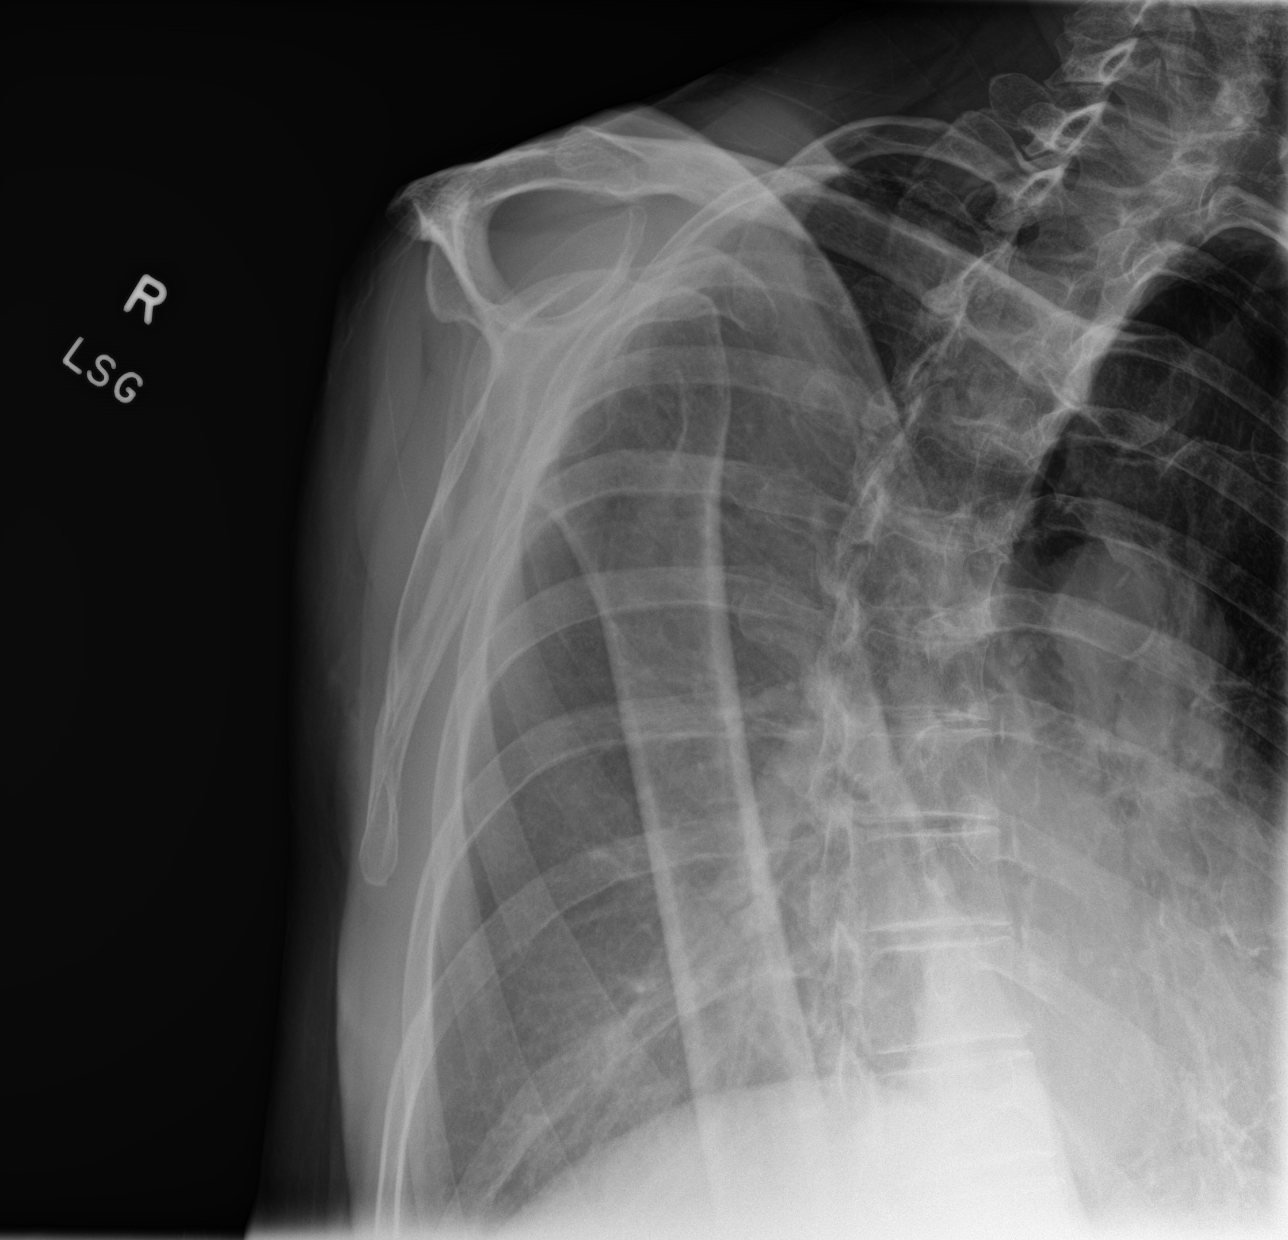

[3 of 3 positions shown; findings below may reference images not displayed]

FINDINGS: Intact AC and glenohumeral joints. Spurring off the lateral aspect
of the acromion may predispose the patient to rotator cuff tears. No
acute fracture nor joint dislocation. The adjacent ribs and lung are
nonacute. Mild soft tissue prominence on the axillary view
anteriorly.
IMPRESSION: Lateral acromial spur which may contribute to rotator cuff tears. No
acute osseous abnormality. Soft tissue prominence seen anteriorly on
the axillary view, nonspecific.

## 2019-11-21 ENCOUNTER — Telehealth: Payer: Self-pay | Admitting: Neurology

## 2019-11-21 MED ORDER — RIVASTIGMINE TARTRATE 3 MG PO CAPS: 3.0000 mg | ORAL_CAPSULE | Freq: Two times a day (BID) | ORAL | 1 refills | Status: DC

## 2019-11-21 NOTE — Telephone Encounter (Signed)
I called the patient.  She wants to try to go up on the Exelon dosing, I will send in the 3 mg capsules for her to take 1 twice daily.  She will be seen in about a month, we will decide whether to go higher on the dose if she is tolerating it.

## 2019-11-21 NOTE — Addendum Note (Signed)
Addended by: Kathrynn Ducking on: 11/21/2019 12:29 PM   Modules accepted: Orders

## 2019-11-21 NOTE — Telephone Encounter (Signed)
Pt has called to report her husband feels she is regressing on the capsule form of rivastigmine (EXELON) 1.5 MG  Where she was doing a lot better on the patch.  Pt is asking for a call to discuss a possible increase in her medication

## 2019-11-26 ENCOUNTER — Ambulatory Visit: Payer: PPO | Admitting: Interventional Cardiology

## 2019-12-01 ENCOUNTER — Telehealth: Payer: Self-pay | Admitting: Neurology

## 2019-12-01 MED ORDER — RIVASTIGMINE TARTRATE 1.5 MG PO CAPS: 1.5000 mg | ORAL_CAPSULE | Freq: Two times a day (BID) | ORAL | 1 refills | Status: DC

## 2019-12-01 NOTE — Telephone Encounter (Signed)
I called the patient.  The patient had been on excellent capsules taking 1.5 mg twice daily and was doing well with this, going to 3 mg dosing caused nausea.  The patient has been off of all her medication in this regard for 3 days and is still nauseated, she is not to restart the medication until she feels better.  She will start back taking 1.5 mg twice daily, in the future she may be able to try going up to 1.5 mg 3 times a day.  If the nausea does not improve in the next several days, she is to contact her primary care physician.

## 2019-12-09 ENCOUNTER — Ambulatory Visit (INDEPENDENT_AMBULATORY_CARE_PROVIDER_SITE_OTHER): Payer: PPO | Admitting: Family Medicine

## 2019-12-09 ENCOUNTER — Encounter: Payer: Self-pay | Admitting: Family Medicine

## 2019-12-09 VITALS — BP 140/82 | HR 58 | Temp 98.1°F | Ht 62.0 in | Wt 141.8 lb

## 2019-12-09 DIAGNOSIS — R21 Rash and other nonspecific skin eruption: Secondary | ICD-10-CM

## 2019-12-09 MED ORDER — TRIAMCINOLONE ACETONIDE 0.5 % EX CREA: 1.0000 "application " | TOPICAL_CREAM | Freq: Two times a day (BID) | CUTANEOUS | 0 refills | Status: DC

## 2019-12-09 NOTE — Patient Instructions (Signed)
Start zyrtec at bedtime.. until itching improved.  Apply the topical steroid cream twice daily to left foot.  Call if not improving as expected.

## 2019-12-09 NOTE — Progress Notes (Signed)
Chief Complaint  Patient presents with  . Rash    Left foot  . Pruritis    All over    History of Present Illness: HPI    84 year old female presents with new onset rash and itching all over.   She reports She has noted itchy red rash on left sole of foot after walking on grass with open back shoes 2 weeks ago. She feels that rash is spreading towards toes.  Now she feels it has spread to itching all over.  No rash anywhere else.  No known exposures.  No new detergents, no new lotions. No new medicines No SOB, no lip swelling.   Saw Dr. [name] for palpitations.. 30 day monitor. She has stopped diltiazem... BP controlled and HR good on amlodipine.   She is doing well on exelon pills for memory issues. Followed by Dr. [name].   This visit occurred during the SARS-CoV-2 public health emergency.  Safety protocols were in place, including screening questions prior to the visit, additional usage of staff PPE, and extensive cleaning of exam room while observing appropriate contact time as indicated for disinfecting solutions.   COVID 19 screen:  No recent travel or known exposure to COVID19 The patient denies respiratory symptoms of COVID 19 at this time. The importance of social distancing was discussed today.     Review of Systems  Constitutional: Negative for chills and fever.  HENT: Negative for congestion and ear pain.   Eyes: Negative for pain and redness.  Respiratory: Negative for cough and shortness of breath.   Cardiovascular: Negative for chest pain, palpitations and leg swelling.  Gastrointestinal: Negative for abdominal pain, blood in stool, constipation, diarrhea, nausea and vomiting.  Genitourinary: Negative for dysuria.  Musculoskeletal: Negative for falls and myalgias.  Skin: Positive for rash.  Neurological: Negative for dizziness.  Psychiatric/Behavioral: Negative for depression. The patient is not nervous/anxious.       Past Medical History:  Diagnosis  Date  . Allergic rhinitis   . Diabetes mellitus without complication (HCC)    diet controlled  . GERD (gastroesophageal reflux disease)   . Hyperlipidemia   . Hypertension   . Memory loss   . Osteoarthritis   . Urinary incontinence     reports that she has quit smoking. She has never used smokeless tobacco. She reports current alcohol use. She reports that she does not use drugs.   Current Outpatient Medications:  .  Apoaequorin (PREVAGEN PO), Take 1 tablet by mouth in the morning and at bedtime., Disp: , Rfl:  .  aspirin 81 MG tablet, Take 81 mg by mouth at bedtime. , Disp: , Rfl:  .  Azelastine HCl 0.15 % SOLN, May use 1-2 sprays per nostril up to twice a day as needed for sinus drainage., Disp: 30 mL, Rfl: 5 .  fluticasone (FLONASE) 50 MCG/ACT nasal spray, Place 1 spray into both nostrils in the morning and at bedtime. For nasal congestion., Disp: 16 g, Rfl: 5 .  ketoconazole (NIZORAL) 2 % shampoo, Apply 1 application topically 2 (two) times a week., Disp: 120 mL, Rfl: 0 .  losartan-hydrochlorothiazide (HYZAAR) 100-25 MG tablet, Take 1 tablet by mouth daily., Disp: 90 tablet, Rfl: 1 .  metoprolol succinate (TOPROL-XL) 50 MG 24 hr tablet, TAKE 1 TABLET BY MOUTH DAILY, Disp: 90 tablet, Rfl: 1 .  montelukast (SINGULAIR) 10 MG tablet, TAKE 1 TABLET BY MOUTH AT BEDTIME, Disp: 30 tablet, Rfl: 2 .  rivastigmine (EXELON) 1.5 MG capsule, Take  1 capsule (1.5 mg total) by mouth 2 (two) times daily., Disp: 180 capsule, Rfl: 1 .  simvastatin (ZOCOR) 40 MG tablet, TAKE 1 TABLET BY MOUTH EACH NIGHT AT BEDTIME, Disp: 90 tablet, Rfl: 3   Observations/Objective: Blood pressure (!) 140/82, pulse 58, temperature 98.1 F (36.7 C), temperature source Temporal, height 5\' 2"  (1.575 m), weight 141 lb 12 oz (64.3 kg), SpO2 99 %.  Physical Exam Constitutional:      General: She is not in acute distress.    Appearance: Normal appearance. She is well-developed. She is not ill-appearing or toxic-appearing.    HENT:     Head: Normocephalic.     Right Ear: Hearing, tympanic membrane, ear canal and external ear normal. Tympanic membrane is not erythematous, retracted or bulging.     Left Ear: Hearing, tympanic membrane, ear canal and external ear normal. Tympanic membrane is not erythematous, retracted or bulging.     Nose: No mucosal edema or rhinorrhea.     Right Sinus: No maxillary sinus tenderness or frontal sinus tenderness.     Left Sinus: No maxillary sinus tenderness or frontal sinus tenderness.     Mouth/Throat:     Pharynx: Uvula midline.  Eyes:     General: Lids are normal. Lids are everted, no foreign bodies appreciated.     Conjunctiva/sclera: Conjunctivae normal.     Pupils: Pupils are equal, round, and reactive to light.  Neck:     Thyroid: No thyroid mass or thyromegaly.     Vascular: No carotid bruit.     Trachea: Trachea normal.  Cardiovascular:     Rate and Rhythm: Normal rate and regular rhythm.     Pulses: Normal pulses.     Heart sounds: Normal heart sounds, S1 normal and S2 normal. No murmur heard.  No friction rub. No gallop.   Pulmonary:     Effort: Pulmonary effort is normal. No tachypnea or respiratory distress.     Breath sounds: Normal breath sounds. No decreased breath sounds, wheezing, rhonchi or rales.  Abdominal:     General: Bowel sounds are normal.     Palpations: Abdomen is soft.     Tenderness: There is no abdominal tenderness.  Musculoskeletal:     Cervical back: Normal range of motion and neck supple.  Skin:    General: Skin is warm and dry.     Findings: No rash.     Comments: Erythematous non-blnaching rash, itchy on left inner arch of foot, no flake, see image.  Neurological:     Mental Status: She is alert.  Psychiatric:        Mood and Affect: Mood is not anxious or depressed.        Speech: Speech normal.        Behavior: Behavior normal. Behavior is cooperative.        Thought Content: Thought content normal.        Judgment: Judgment  normal.        Assessment and Plan   Rash of foot  Likely contact or allergic dermatitis.  Treat with topical steroid and oral antihistamine.     [name], MD

## 2019-12-09 NOTE — Assessment & Plan Note (Signed)
Likely contact or allergic dermatitis.  Treat with topical steroid and oral antihistamine.

## 2019-12-11 ENCOUNTER — Other Ambulatory Visit: Payer: Self-pay

## 2019-12-16 ENCOUNTER — Ambulatory Visit (INDEPENDENT_AMBULATORY_CARE_PROVIDER_SITE_OTHER): Payer: PPO | Admitting: Family Medicine

## 2019-12-16 ENCOUNTER — Encounter: Payer: Self-pay | Admitting: Family Medicine

## 2019-12-16 ENCOUNTER — Other Ambulatory Visit: Payer: Self-pay

## 2019-12-16 VITALS — BP 118/70 | HR 71 | Temp 97.3°F | Ht 62.0 in | Wt 139.3 lb

## 2019-12-16 DIAGNOSIS — R21 Rash and other nonspecific skin eruption: Secondary | ICD-10-CM

## 2019-12-16 MED ORDER — KETOCONAZOLE 2 % EX CREA: 1.0000 "application " | TOPICAL_CREAM | Freq: Two times a day (BID) | CUTANEOUS | 1 refills | Status: DC

## 2019-12-16 NOTE — Assessment & Plan Note (Signed)
Now papules on R hand-unsure if related No improvement with triamcinolone cream  Some features of tinea pedis on L foot (along with thickened nails) Will try tx with ketoconazole cream 2% and update If worse or no imp may need to consider dermatology referral  She will update Disc imp of keeping feet clean and dry

## 2019-12-16 NOTE — Patient Instructions (Signed)
Try the ketoconazole cream twice daily to itchy/rash areas  Continue the oral antihistamine for itch   Keep your feet dry (as much as possible)  Dry well after bathing   If rash develops in other areas let us know  If you recognize poison ivy in your yard-avoid it as well   Update if not starting to improve in a week or if worsening   Would consider dermatology referral if this does not improve

## 2019-12-16 NOTE — Progress Notes (Signed)
Subjective:    Patient ID: Janet Chapman, female    DOB: 04-29-1936, 84 y.o.   MRN: [id number]  This visit occurred during the SARS-CoV-2 public health emergency.  Safety protocols were in place, including screening questions prior to the visit, additional usage of staff PPE, and extensive cleaning of exam room while observing appropriate contact time as indicated for disinfecting solutions.    HPI Pr presents for c/o rash on L heel and R hand  84 yo pt of Dr [name]   She was seen for rash on the foot by pcp on 7/27 Thought to be contact dermatitis and inst to use zyrtec/px steroid cream (triamcinolone 0.5%)  Notes it started as a small red spot (? Wondered if bug bite)  Now redness has extended around heel Also several spots on her R hand   Very itchy in spells   No known poison ivy in yard (she thinks-and knows what to avoid) She did pull up a weed yesterday before she noticed the red spots on hand  No tick bites found   No exp to animals No recent guests or travel  Has not been exposed to scabies/mites  Patient Active Problem List   Diagnosis Date Noted  . Diabetes mellitus due to underlying condition with unspecified complications ([place]) 47/65/4650  . Nasal septal deviation 11/26/2018  . Medicare annual wellness visit, subsequent 12/04/2017  . Coronary artery disease involving native coronary artery of native heart with unstable angina pectoris ([place])   . Shortness of breath 11/02/2017  . Bradycardia 06/12/2017  . Fatigue 09/12/2016  . Itchy scalp 02/03/2016  . Rash of foot 02/03/2016  . Spider varicose veins 07/22/2015  . Advanced care planning/counseling discussion 07/08/2015  . Mild cognitive impairment 01/07/2015  . Chronic radicular low back pain 01/02/2014  . Atrophic vaginitis 09/26/2013  . Pulmonary nodule, right 05/23/2013  . INSOMNIA, CHRONIC 01/14/2010  . Diabetes mellitus, controlled ([place]) 11/11/2008  . MAMMOGRAM, ABNORMAL, LEFT 11/09/2008  . Chronic  idiopathic constipation 09/11/2007  . OBESITY 05/31/2007  . Other allergic rhinitis 05/31/2007  . GERD 05/31/2007  . Osteoarthritis 05/31/2007  . URINARY INCONTINENCE 05/31/2007  . High cholesterol 04/19/2007  . Essential hypertension, benign 04/19/2007  . ACTINIC KERATOSIS 02/01/2007   Past Medical History:  Diagnosis Date  . Allergic rhinitis   . Diabetes mellitus without complication (HCC)    diet controlled  . GERD (gastroesophageal reflux disease)   . Hyperlipidemia   . Hypertension   . Memory loss   . Osteoarthritis   . Urinary incontinence    Past Surgical History:  Procedure Laterality Date  . ABDOMINAL HYSTERECTOMY  1993   [facility]  . CARDIOVASCULAR STRESS TEST  2004   [facility]. [facility], cardiolite-normal  . CHOLECYSTECTOMY  [phone]  . COLONOSCOPY    . LEFT HEART CATH AND CORONARY ANGIOGRAPHY N/A 11/06/2017   Procedure: LEFT HEART CATH AND CORONARY ANGIOGRAPHY;  Surgeon: Burnell Blanks, MD;  Location: [facility];  Service: Cardiovascular;  Laterality: N/A;   Social History   Tobacco Use  . Smoking status: Former Research scientist (life sciences)  . Smokeless tobacco: Never Used  . Tobacco comment: In college  Vaping Use  . Vaping Use: Never used  Substance Use Topics  . Alcohol use: Yes    Comment: 1 glass of wine per month  . Drug use: No   Family History  Problem Relation Age of Onset  . Heart attack Father 83  . Dementia Mother   . Stroke Mother 68  .  Ovarian cancer Sister 26  . Diabetes Other        Maternal side  . Ovarian cancer Other        Grandmother  . Ovarian cancer Other        aunt  . Colon cancer Neg Hx    Allergies  Allergen Reactions  . Codeine Nausea Only   Current Outpatient Medications on File Prior to Visit  Medication Sig Dispense Refill  . Apoaequorin (PREVAGEN PO) Take 1 tablet by mouth in the morning and at bedtime.    Marland Kitchen aspirin 81 MG tablet Take 81 mg by mouth at bedtime.     . Azelastine HCl 0.15 % SOLN May use 1-2 sprays per nostril up to  twice a day as needed for sinus drainage. 30 mL 5  . fluticasone (FLONASE) 50 MCG/ACT nasal spray Place 1 spray into both nostrils in the morning and at bedtime. For nasal congestion. 16 g 5  . ketoconazole (NIZORAL) 2 % shampoo Apply 1 application topically 2 (two) times a week. 120 mL 0  . losartan-hydrochlorothiazide (HYZAAR) 100-25 MG tablet Take 1 tablet by mouth daily. 90 tablet 1  . metoprolol succinate (TOPROL-XL) 50 MG 24 hr tablet TAKE 1 TABLET BY MOUTH DAILY 90 tablet 1  . montelukast (SINGULAIR) 10 MG tablet TAKE 1 TABLET BY MOUTH AT BEDTIME 30 tablet 2  . rivastigmine (EXELON) 1.5 MG capsule Take 1 capsule (1.5 mg total) by mouth 2 (two) times daily. 180 capsule 1  . simvastatin (ZOCOR) 40 MG tablet TAKE 1 TABLET BY MOUTH EACH NIGHT AT BEDTIME 90 tablet 3  . triamcinolone cream (KENALOG) 0.5 % Apply 1 application topically 2 (two) times daily. 30 g 0   No current facility-administered medications on file prior to visit.     Review of Systems  Constitutional: Negative for activity change, appetite change, fatigue, fever and unexpected weight change.  HENT: Negative for congestion, ear pain, rhinorrhea, sinus pressure and sore throat.   Eyes: Negative for pain, redness and visual disturbance.  Respiratory: Negative for cough, shortness of breath and wheezing.   Cardiovascular: Negative for chest pain and palpitations.  Gastrointestinal: Negative for abdominal pain, blood in stool, constipation and diarrhea.  Endocrine: Negative for polydipsia and polyuria.  Genitourinary: Negative for dysuria, frequency and urgency.  Musculoskeletal: Negative for arthralgias, back pain and myalgias.  Skin: Positive for rash. Negative for pallor.  Allergic/Immunologic: Negative for environmental allergies.  Neurological: Negative for dizziness, syncope and headaches.  Hematological: Negative for adenopathy. Does not bruise/bleed easily.  Psychiatric/Behavioral: Negative for decreased  concentration and dysphoric mood. The patient is not nervous/anxious.        Objective:   Physical Exam Constitutional:      General: She is not in acute distress.    Appearance: Normal appearance. She is normal weight. She is not ill-appearing.  Eyes:     General: No scleral icterus.       Right eye: No discharge.        Left eye: No discharge.     Conjunctiva/sclera: Conjunctivae normal.     Pupils: Pupils are equal, round, and reactive to light.  Cardiovascular:     Rate and Rhythm: Normal rate and regular rhythm.  Musculoskeletal:     Cervical back: Normal range of motion and neck supple.     Right lower leg: No edema.     Left lower leg: No edema.  Lymphadenopathy:     Cervical: No cervical adenopathy.  Skin:  Comments: Rash on medial r foot (erythematous/scaley/non blanching) is present now on heel (with an oval area) and also lateral foot  Sole of that foot has some scale w/o erythema and thickened nails No maceration between toes   2 small erythematous papules on R lateral hand  Neurological:     Mental Status: She is alert.     Sensory: No sensory deficit.  Psychiatric:        Mood and Affect: Mood normal.           Assessment & Plan:   Problem List Items Addressed This Visit      Musculoskeletal and Integument   Rash of foot - Primary    Now papules on R hand-unsure if related No improvement with triamcinolone cream  Some features of tinea pedis on L foot (along with thickened nails) Will try tx with ketoconazole cream 2% and update If worse or no imp may need to consider dermatology referral  She will update Disc imp of keeping feet clean and dry

## 2019-12-19 ENCOUNTER — Telehealth: Payer: Self-pay

## 2019-12-19 NOTE — Progress Notes (Signed)
Opened in error

## 2019-12-22 ENCOUNTER — Ambulatory Visit: Payer: PPO | Admitting: Neurology

## 2019-12-22 ENCOUNTER — Encounter: Payer: Self-pay | Admitting: Neurology

## 2019-12-22 VITALS — BP 149/70 | HR 50 | Ht 61.0 in | Wt 141.0 lb

## 2019-12-22 DIAGNOSIS — G3184 Mild cognitive impairment, so stated: Secondary | ICD-10-CM | POA: Diagnosis not present

## 2019-12-22 NOTE — Progress Notes (Signed)
PATIENT: Janet Chapman DOB: 11-20-1935  REASON FOR VISIT: follow up HISTORY FROM: patient  HISTORY OF PRESENT ILLNESS: Today 12/22/19 Janet Chapman 84 year old female with history of memory problems. CT of the head was normal for age. The Exelon patch caused skin irritation, is now on low-dose capsules. Feels the Exelon capsules are causing rash to her left foot, nausea, dizziness, and itching at night. Her husband notices a difference in her memory when she is on the medications, she takes Prevagen from time to time. Has noticed difficulty with short-term memory, will misplace things.  She has not given up anything because of memory.  She continues to drive well, manages her medications, and the household.  She remains active socially.  No recent falls.  She presents today for evaluation accompanied by her husband.  HISTORY  06/18/2019 Dr. [name]: Janet Chapman is an 84 year old right-handed white female with a history of some issues with mild memory changes over the last year or 2.  The patient has noted that she is somewhat forgetful, she has difficulty remembering words or names for people.  She may sometimes have difficulty remembering recent events.  She has not altered any of her activities of daily living.  She is still operating a motor vehicle without difficulty, she is keeping up with medications and appointments without difficulty.  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  The patient has been on some Prevagen and her husband has felt that this has helped her memory.  The patient is concerned that her mother had dementia as she got older.  The patient denies any numbness or weakness of extremities or any difficulty with balance or difficulty controlling the bowels or the bladder.  She is sleeping well at night, she denies any significant fatigue during the day.  She comes to the office today for an evaluation.  She indicates that she was placed on  some medication for memory a year or 2 ago, she cannot remember the name of it but did not tolerate it secondary to nausea.  REVIEW OF SYSTEMS: Out of a complete 14 system review of symptoms, the patient complains only of the following symptoms, and all other reviewed systems are negative.  Memory loss  ALLERGIES: Allergies  Allergen Reactions  . Codeine Nausea Only    HOME MEDICATIONS: Outpatient Medications Prior to Visit  Medication Sig Dispense Refill  . Apoaequorin (PREVAGEN PO) Take 1 tablet by mouth in the morning and at bedtime.    Marland Kitchen aspirin 81 MG tablet Take 81 mg by mouth at bedtime.     . Azelastine HCl 0.15 % SOLN May use 1-2 sprays per nostril up to twice a day as needed for sinus drainage. 30 mL 5  . fluticasone (FLONASE) 50 MCG/ACT nasal spray Place 1 spray into both nostrils in the morning and at bedtime. For nasal congestion. 16 g 5  . ketoconazole (NIZORAL) 2 % shampoo Apply 1 application topically 2 (two) times a week. 120 mL 0  . losartan-hydrochlorothiazide (HYZAAR) 100-25 MG tablet Take 1 tablet by mouth daily. 90 tablet 1  . metoprolol succinate (TOPROL-XL) 50 MG 24 hr tablet TAKE 1 TABLET BY MOUTH DAILY 90 tablet 1  . montelukast (SINGULAIR) 10 MG tablet TAKE 1 TABLET BY MOUTH AT BEDTIME 30 tablet 2  . simvastatin (ZOCOR) 40 MG tablet TAKE 1 TABLET BY MOUTH EACH NIGHT AT BEDTIME 90 tablet 3  . triamcinolone cream (KENALOG) 0.5 %  Apply 1 application topically 2 (two) times daily. 30 g 0  . rivastigmine (EXELON) 1.5 MG capsule Take 1 capsule (1.5 mg total) by mouth 2 (two) times daily. (Patient not taking: Reported on 12/22/2019) 180 capsule 1  . ketoconazole (NIZORAL) 2 % cream Apply 1 application topically 2 (two) times daily. Small amount to affected (rash) areas 15 g 1   No facility-administered medications prior to visit.    PAST MEDICAL HISTORY: Past Medical History:  Diagnosis Date  . Allergic rhinitis   . Diabetes mellitus without complication (HCC)     diet controlled  . GERD (gastroesophageal reflux disease)   . Hyperlipidemia   . Hypertension   . Memory loss   . Osteoarthritis   . Urinary incontinence     PAST SURGICAL HISTORY: Past Surgical History:  Procedure Laterality Date  . ABDOMINAL HYSTERECTOMY  1993   TA  . CARDIOVASCULAR STRESS TEST  2004   H. [facility], cardiolite-normal  . CHOLECYSTECTOMY  [phone]  . COLONOSCOPY    . LEFT HEART CATH AND CORONARY ANGIOGRAPHY N/A 11/06/2017   Procedure: LEFT HEART CATH AND CORONARY ANGIOGRAPHY;  Surgeon: Burnell Blanks, MD;  Location: [facility];  Service: Cardiovascular;  Laterality: N/A;    FAMILY HISTORY: Family History  Problem Relation Age of Onset  . Heart attack Father 69  . Dementia Mother   . Stroke Mother 38  . Ovarian cancer Sister 22  . Diabetes Other        Maternal side  . Ovarian cancer Other        Grandmother  . Ovarian cancer Other        aunt  . Colon cancer Neg Hx     SOCIAL HISTORY: Social History   Socioeconomic History  . Marital status: Married    Spouse name: Not on file  . Number of children: 0  . Years of education: Not on file  . Highest education level: Not on file  Occupational History  . Occupation: retired  Tobacco Use  . Smoking status: Former Research scientist (life sciences)  . Smokeless tobacco: Never Used  . Tobacco comment: In college  Vaping Use  . Vaping Use: Never used  Substance and Sexual Activity  . Alcohol use: Yes    Comment: 1 glass of wine per month  . Drug use: No  . Sexual activity: Never  Other Topics Concern  . Not on file  Social History Narrative    Regular exercise: yes, Curves 2x a week   Married 48 + years    Diet: fruit and veggies, no FF, some water, artificial sweetener   HCPOA: [name], has living will, full code ( reviewed 2014)            Social Determinants of Health   Financial Resource Strain:   . Difficulty of Paying Living Expenses:   Food Insecurity:   . Worried About Sales executive in the Last Year:   . Arboriculturist in the Last Year:   Transportation Needs:   . Film/video editor (Medical):   Marland Kitchen Lack of Transportation (Non-Medical):   Physical Activity:   . Days of Exercise per Week:   . Minutes of Exercise per Session:   Stress:   . Feeling of Stress :   Social Connections:   . Frequency of Communication with Friends and Family:   . Frequency of Social Gatherings with Friends and Family:   . Attends Religious Services:   .  Active Member of Clubs or Organizations:   . Attends Archivist Meetings:   Marland Kitchen Marital Status:   Intimate Partner Violence:   . Fear of Current or Ex-Partner:   . Emotionally Abused:   Marland Kitchen Physically Abused:   . Sexually Abused:       PHYSICAL EXAM  Vitals:   12/22/19 1100  BP: (!) 149/70  Pulse: (!) 50  Weight: 141 lb (64 kg)  Height: 5\' 1"  (1.549 m)   Body mass index is 26.64 kg/m.  Generalized: Well developed, in no acute distress  MMSE - Mini Mental State Exam 12/22/2019 06/18/2019 12/04/2017  Orientation to time 5 5 5   Orientation to Place 5 5 5   Registration 3 3 3   Attention/ Calculation 5 3 0  Recall 1 2 3   Language- name 2 objects 2 2 0  Language- repeat 1 1 1   Language- follow 3 step command 3 3 3   Language- read & follow direction 1 1 0  Write a sentence 1 1 0  Copy design 1 1 0  Total score 28 27 20     Neurological examination  Mentation: Alert oriented to time, place, history taking. Follows all commands speech and language fluent Cranial nerve II-XII: Pupils were equal round reactive to light. Extraocular movements were full, visual field were full on confrontational test. Facial sensation and strength were normal.  Head turning and shoulder shrug  were normal and symmetric. Motor: The motor testing reveals 5 over 5 strength of all 4 extremities. Good symmetric motor tone is noted throughout.  Sensory: Sensory testing is intact to soft touch on all 4 extremities. No evidence of extinction is  noted.  Coordination: Cerebellar testing reveals good finger-nose-finger and heel-to-shin bilaterally.  Gait and station: Gait is normal.  Reflexes: Deep tendon reflexes are symmetric and normal bilaterally.   DIAGNOSTIC DATA (LABS, IMAGING, TESTING) - I reviewed patient records, labs, notes, testing and imaging myself where available.  Lab Results  Component Value Date   WBC 5.7 12/18/2018   HGB 14.3 12/18/2018   HCT 42.1 12/18/2018   MCV 95.8 12/18/2018   PLT 215.0 12/18/2018      Component Value Date/Time   NA 141 12/18/2018 0812   NA 138 09/04/2018 1537   K 3.8 12/18/2018 0812   CL 96 12/18/2018 0812   CO2 36 (H) 12/18/2018 0812   GLUCOSE 102 (H) 12/18/2018 0812   BUN 20 12/18/2018 0812   BUN 20 09/04/2018 1537   CREATININE 0.85 12/18/2018 0812   CALCIUM 10.4 12/18/2018 0812   PROT 7.0 12/18/2018 0812   ALBUMIN 4.8 12/18/2018 0812   AST 15 12/18/2018 0812   ALT 12 12/18/2018 0812   ALKPHOS 62 12/18/2018 0812   BILITOT 0.7 12/18/2018 0812   GFRNONAA 63 09/04/2018 1537   GFRAA 73 09/04/2018 1537   Lab Results  Component Value Date   CHOL 162 12/18/2018   HDL 62.80 12/18/2018   LDLCALC 85 12/18/2018   LDLDIRECT 119.8 04/26/2009   TRIG 73.0 12/18/2018   CHOLHDL 3 12/18/2018   Lab Results  Component Value Date   HGBA1C 6.4 12/18/2018   Lab Results  Component Value Date   VITAMINB12 461 06/18/2019   Lab Results  Component Value Date   TSH 3.92 12/18/2018    ASSESSMENT AND PLAN 84 y.o. year old female  has a past medical history of Allergic rhinitis, Diabetes mellitus without complication ([place]), GERD (gastroesophageal reflux disease), Hyperlipidemia, Hypertension, Memory loss, Osteoarthritis, and Urinary incontinence. here with:  1.  Mild cognitive impairment  Memory scores overall stable, 28/30 today.  We will discontinue Exelon due to potential side effect of dizziness, nausea, nighttime itching, and rash (unclear rash to left foot is related).  After a  few weeks, we may try Namenda, they will contact me.  It is possible, we may try low-dose Aricept, but record indicates a few years ago she tried something that resulted in nausea for memory.  I have encouraged her to remain active.  She will follow-up in 6 months or sooner if needed.  I spent 30 minutes of face-to-face and non-face-to-face time with patient.  This included previsit chart review, lab review, study review, order entry, electronic health record documentation, patient education.  Butler Denmark, AGNP-C, DNP 12/22/2019, 11:22 AM [facility] [address] [place], [place] [address] [phone]

## 2019-12-22 NOTE — Patient Instructions (Signed)
Stop the Exelon patch due to side effect  May consider addition of Namenda in a few weeks  See you back in 6 months

## 2019-12-23 NOTE — Progress Notes (Signed)
I have read the note, and I agree with the clinical assessment and plan.  Arnetha Silverthorne K Mcdonald Reiling   

## 2019-12-24 NOTE — Chronic Care Management (AMB) (Signed)
Chronic Care Management Pharmacy  Name: Janet Chapman  MRN: [id number] DOB: 11-Nov-1935  Chief Complaint/ HPI  Janet Chapman,  84 y.o., female presents for their Follow-Up CCM visit with the clinical pharmacist via telephone.  PCP : [name], MD   Patient concerns: would like to know which memory supplement is the best  Their chronic conditions include: HTN, DM, CAD, allergic rhinitis, GERD, chronic pain, OA, mild cognitive impairment  Office Visits:  12/16/19: Tower, foot rash - no improvement with topical steroid. Try ketoconazole 2% for features of tinea pedis on left foot. Keep feet clean and dry. Consider dermatology referral if no improvement.   12/09/19: Bedsole, foot rash - start zyrtec at bedtime until itching improved. Apply topical steroid twice daily to left foot.   1/12/21[name], memory loss, dizziness, fatigue - possilbly medication induced, noticed ever since separating HCTZ from combo to two separate drugs, CBD oil for pain, allergies well controlled, itchy scalp, referral to neurology for memory  Consult Visit:   12/22/19: [name] NP Neurology - stop exelon patch due to side effect. May consider addition of Namenda in a few weeks.   12/01/19:  Dr. [name] Neurology -Patient did not tolerate increased dose of Exelon to 3 mg bid due to nausea. May consider increasing to 1.5 mg tid in the future.   11/04/19:  Dr. [name] Cardiology - Slow heart rate detected on monitor - discontinue diltiazem. Monitor bp 2-3 times per week 2-3 hours after medications. May need to add amlodipine to help with blood pressure if increases above 140/80 mmHg.   08/29/19: Dr. [name] Cardiology - goal BP 140/80 mmHg. Order 30 day monitor. Quadrigeminy noted during exam - need to exclude ventricular ectopy.   08/25/19: [name] Allergy - restart fluticasone nasal spray for sinus drainage. May use OTC cetirizine, loratadine, fexofenadine or levoceterizine daily as needed. Continue  montelukast and azelastine.   06/29/19: [name], neurology - CT of head normal for age, trial Exelon patch  04/23/19: [name], Allergy - post nasal drip doing much better with azelastine BID, stopped flonase, Singulair daily, stopped antihistamine, still some sneezing and congestion (allergic to molds, dog, dust mites) - continue current medications  04/03/19: [name], cardiology - no medication changes  Allergies  Allergen Reactions  . Codeine Nausea Only  . Exelon [Rivastigmine]     Rash on chest and feet   Medications: Outpatient Encounter Medications as of 12/26/2019  Medication Sig  . Apoaequorin (PREVAGEN PO) Take 1 tablet by mouth in the morning and at bedtime.  Marland Kitchen aspirin 81 MG tablet Take 81 mg by mouth at bedtime.   . Azelastine HCl 0.15 % SOLN May use 1-2 sprays per nostril up to twice a day as needed for sinus drainage.  . fluticasone (FLONASE) 50 MCG/ACT nasal spray Place 1 spray into both nostrils in the morning and at bedtime. For nasal congestion.  Marland Kitchen ketoconazole (NIZORAL) 2 % shampoo Apply 1 application topically 2 (two) times a week.  . losartan-hydrochlorothiazide (HYZAAR) 100-25 MG tablet Take 1 tablet by mouth daily.  . metoprolol succinate (TOPROL-XL) 50 MG 24 hr tablet TAKE 1 TABLET BY MOUTH DAILY  . montelukast (SINGULAIR) 10 MG tablet TAKE 1 TABLET BY MOUTH AT BEDTIME  . simvastatin (ZOCOR) 40 MG tablet TAKE 1 TABLET BY MOUTH EACH NIGHT AT BEDTIME  . triamcinolone cream (KENALOG) 0.5 % Apply 1 application topically 2 (two) times daily. (Patient not taking: Reported on 12/26/2019)   No facility-administered encounter  medications on file as of 12/26/2019.   Current Diagnosis/Assessment: Goals    .  Patient Stated (pt-stated)      Starting 11/27/2016, I will continue reading daily to keep my brain sharp.    .  Patient Stated      Starting 12/04/2017, I will continue to take medications as prescribed.     .  Pharmacy Care Plan      Current Barriers:    . Chronic Disease Management support, education, and care coordination needs related to HTN, DM, CAD, allergic rhinitis, GERD, chronic pain, OA, mild cognitive impairment  Pharmacist Clinical Goal(s):  Marland Kitchen Achieve cholesterol goal of LDL < 70 mg/dL. Increase daily exercise with goal of 30 minutes, 5 days a week. Continue to eat a heart heathy diet high in lean meats and vegetables. . Prevent memory decline - patient has resumed prevagen supplement due to unable to tolerate Exelon patch and continues to eat healthy diet high in nutrients such as the DASH or mediterranean diet for preventing memory loss.  . Remain up to date of vaccinations. Recommend Tetanus booster and Shingles vaccine.  Interventions: . Comprehensive medication review performed.  Patient Self Care Activities:  . Takes medications as prescribed. . Patient continue check blood pressure monthly.  . Follow-up with neurology in 2-3 weeks to determine next treatment option once rash symptoms improved after discontinuing Exelon.   Please see past updates related to this goal by clicking on the "Past Updates" button in the selected goal        Diabetes   Recent Relevant Labs: Lab Results  Component Value Date/Time   HGBA1C 6.4 12/18/2018 08:12 AM   HGBA1C 6.3 12/04/2017 09:09 AM   MICROALBUR 0.6 05/18/2010 09:38 AM   MICROALBUR 1.3 01/25/2009 08:27 AM    Checking BG: none Patient is currently controlled on the following medications:   No pharmacotherapy  Last diabetic eye exam:  Lab Results  Component Value Date/Time   HMDIABEYEEXA No Retinopathy 02/28/2019 12:00 AM    Last diabetic foot exam:  Lab Results  Component Value Date/Time   HMDIABFOOTEX done 12/20/2018 12:00 AM    We discussed: diet - avoids sweets, sodas; doesn't exercise regularly, when weather is good, walks a lot  Update 12/26/2019 - Patient eats healthy diet and avoids potatoes and bread. Drinks decaf coffee Has lost 15 lbs across the last  year.   Plan: Continue current medications. Increase daily exercise with goal of 30 minutes, 5 days a week.  Hypertension   Office blood pressures are  BP Readings from Last 3 Encounters:  12/22/19 (!) 149/70  12/16/19 118/70  12/09/19 (!) 140/82   CMP Latest Ref Rng & Units 12/18/2018 09/04/2018 11/02/2017  Glucose 70 - 99 mg/dL 102(H) 125(H) 108(H)  BUN 6 - 23 mg/dL 20 20 12   Creatinine 0.40 - 1.20 mg/dL 0.85 0.87 0.70  Sodium 135 - 145 mEq/L 141 138 140  Potassium 3.5 - 5.1 mEq/L 3.8 3.7 3.6  Chloride 96 - 112 mEq/L 96 93(L) 95(L)  CO2 19 - 32 mEq/L 36(H) 26 30(H)  Calcium 8.4 - 10.5 mg/dL 10.4 10.4(H) 9.8  Total Protein 6.0 - 8.3 g/dL 7.0 - -  Total Bilirubin 0.2 - 1.2 mg/dL 0.7 - -  Alkaline Phos 39 - 117 U/L 62 - -  AST 0 - 37 U/L 15 - -  ALT 0 - 35 U/L 12 - -  BP goal < 140/90 mmHg Patient has failed these meds in the past: none  Patient  checks BP at home: once monthly Patient home BP readings are ranging: none about 140/90  Patient is currently controlled on the following medications:  Metoprolol succinate 50 mg - 1 tablet daily  Losartan-HCTZ 100-25 mg- 1 tablet daily  We discussed: drug store separated HCTZ and losartan for a little while due to supply, thought it caused some dizziness, but has since attributed the dizziness to CBD supplement   Update 12/26/2019 - Blood pressure medication has been changed by cardiology (D/C diltiazem). BP at home has been 134.86, 112/64, 124/86, 123/64 pulse 50. Denies dizziness.   Plan: Continue current medications   Hyperlipidemia/CAD   Lipid Panel     Component Value Date/Time   CHOL 162 12/18/2018 0812   TRIG 73.0 12/18/2018 0812   HDL 62.80 12/18/2018 0812   CHOLHDL 3 12/18/2018 0812   VLDL 14.6 12/18/2018 0812   LDLCALC 85 12/18/2018 0812   LDLDIRECT 119.8 04/26/2009 0846   LDL goal < 70 Patient has failed these meds in past:  Patient is currently uncontrolled on the following medications:  Simvastatin 40 mg -  once daily at bedtime  Aspirin 81 mg - once daily  We discussed: Pt reports diet has improved since August 2020. Increased vegetables, limiting red meats, avoiding eating out. We discussed diet and exercise recommendations and the potential to switch to high intensity statin in the future.  Plan: Continue current medications. May switch simvastatin to rosuvastatin 20 mg if LDL remains elevated with next lipid panel. Patient will continue to work on healthy diet and exercise.   Mild Cognitive Impairment  Symptoms: forgetful of things like names, directions, will come to her later Patient has failed these meds in past: donepezil (nausea), galantamine, Exelon patch (rash) Patient is currently controlled on the following medications:   Prevagen OTC   We discussed:  Started Exelon 06/17/18 per neurology, patient is feeling much better about her memory after normal CT scan. Applies new patch to chest or arm daily, rotates sites. Denies adverse effects. About 6 months ago started Prevagen, switched to brain booster and brain support recently. Would like to know if she should continue both.  Update 12/26/2019 - Patient had allergy to Exelon patch with red rash all over her chest. Did not tolerate capsules of rivastigmine either. Patient began having problems with itching on feet as well. The neurologist office has discontinued all rivastigmine products at this time. They are waiting 2 weeks before continuing any additional medications for memory support to allow effects of medicine to get out of her system. Following up with neurology for regular appointment in February. Discussed calling neurology in 2-3 weeks to determine future treatment.   Plan: Recommend patient follow-up with neurology office in 2-3 weeks when rash improves to determine therapy options. Patient resumed prevagen due to concern with nothing on board for memory. Recommend continuing daily multivitamin and focusing on healthy diet high in  nutrients such as the DASH or mediterranean diet for preventing memory loss.  Allergic Rhinitis  Allergens: mold, dogs, dust mites Pt report symptoms have improved recently Patient has failed these meds in past: flonase, saline spray Patient is currently controlled on the following medications:  Montelukast 10 mg - 1 tablet at bedtime  Azelastine nasal solution 0.15% - 1-2 sprays per nostril twice daily   We discussed: recently doing much better, forgets to take azelastine doing so well, cleaning sheets often  Plan: Continue current medications   Adverse effect: Dizziness   Patient is currently on the following  medications: CBD energy capsule daily (ingredients: CBD 25 mg, vitamin B12 24 mcg, l-theanine100 mcg, rhodiola 100 mg, caffeine 50 mg - per capsule daily)  We discussed:  Patient stopped CBD supplement to see if dizziness would improve, has not experience dizziness since discontinuing product  Plan: Recommend carefully reading labels of CBD products for additional ingredients. Avoid this particular product due to dizziness.  Medication Management  OTCs:  Multivitamin with D3 daily  Pharmacy: [facility]; [facility]   Adherence: No concerns, does not use pill box   Affordability:  No concerns  Vaccines: COVID vaccine, influenza, PCV13, PPSV23 - up to date; due for Td, Shingrix   CCM Follow Up:  03/2020  Sherre Poot, PharmD, [facility] Clinical Pharmacist [facility] [phone] (office) [phone] (mobile)

## 2019-12-25 ENCOUNTER — Telehealth: Payer: Self-pay | Admitting: Family Medicine

## 2019-12-25 DIAGNOSIS — R21 Rash and other nonspecific skin eruption: Secondary | ICD-10-CM

## 2019-12-25 NOTE — Telephone Encounter (Signed)
Pt called stating she saw dr [name] 7/27 and dr tower 8/3 for rash.  Pt is wanting to know if dr [name] can refer her to dermatology  She stated she is not any better  1st available doesn't matter [place] or [place]   If pt doesn't answer please call [phone]

## 2019-12-26 ENCOUNTER — Ambulatory Visit: Payer: PPO

## 2019-12-26 DIAGNOSIS — E088 Diabetes mellitus due to underlying condition with unspecified complications: Secondary | ICD-10-CM

## 2019-12-26 DIAGNOSIS — E7800 Pure hypercholesterolemia, unspecified: Secondary | ICD-10-CM

## 2019-12-26 DIAGNOSIS — I10 Essential (primary) hypertension: Secondary | ICD-10-CM

## 2019-12-26 NOTE — Telephone Encounter (Signed)
Let pt know referral sent., will be called in 1-2 weeks.

## 2019-12-26 NOTE — Patient Instructions (Signed)
Visit Information  Goals Addressed            This Visit's Progress   . Pharmacy Care Plan       Current Barriers:  . Chronic Disease Management support, education, and care coordination needs related to HTN, DM, CAD, allergic rhinitis, GERD, chronic pain, OA, mild cognitive impairment  Pharmacist Clinical Goal(s):  Marland Kitchen Achieve cholesterol goal of LDL < 70 mg/dL. Increase daily exercise with goal of 30 minutes, 5 days a week. Continue to eat a heart heathy diet high in lean meats and vegetables. . Prevent memory decline - patient has resumed prevagen supplement due to unable to tolerate Exelon patch and continues to eat healthy diet high in nutrients such as the DASH or mediterranean diet for preventing memory loss.  . Remain up to date of vaccinations. Recommend Tetanus booster and Shingles vaccine.  Interventions: . Comprehensive medication review performed.  Patient Self Care Activities:  . Takes medications as prescribed. . Patient continue check blood pressure monthly.  . Follow-up with neurology in 2-3 weeks to determine next treatment option once rash symptoms improved after discontinuing Exelon.   Please see past updates related to this goal by clicking on the "Past Updates" button in the selected goal         The patient verbalized understanding of instructions provided today and declined a print copy of patient instruction materials.   Telephone follow up appointment with pharmacy team member scheduled for: 03/2020  Sherre Poot, PharmD, [facility] Clinical Pharmacist [facility] [phone] (office) [phone] (mobile)   DASH Eating Plan DASH stands for "Dietary Approaches to Stop Hypertension." The DASH eating plan is a healthy eating plan that has been shown to reduce high blood pressure (hypertension). It may also reduce your risk for type 2 diabetes, heart disease, and stroke. The DASH eating plan may also help with weight loss. What are tips for  following this plan?  General guidelines  Avoid eating more than 2,300 mg (milligrams) of salt (sodium) a day. If you have hypertension, you may need to reduce your sodium intake to 1,500 mg a day.  Limit alcohol intake to no more than 1 drink a day for nonpregnant women and 2 drinks a day for men. One drink equals 12 oz of beer, 5 oz of wine, or 1 oz of hard liquor.  Work with your health care provider to maintain a healthy body weight or to lose weight. Ask what an ideal weight is for you.  Get at least 30 minutes of exercise that causes your heart to beat faster (aerobic exercise) most days of the week. Activities may include walking, swimming, or biking.  Work with your health care provider or diet and nutrition specialist (dietitian) to adjust your eating plan to your individual calorie needs. Reading food labels   Check food labels for the amount of sodium per serving. Choose foods with less than 5 percent of the Daily Value of sodium. Generally, foods with less than 300 mg of sodium per serving fit into this eating plan.  To find whole grains, look for the word "whole" as the first word in the ingredient list. Shopping  Buy products labeled as "low-sodium" or "no salt added."  Buy fresh foods. Avoid canned foods and premade or frozen meals. Cooking  Avoid adding salt when cooking. Use salt-free seasonings or herbs instead of table salt or sea salt. Check with your health care provider or pharmacist before using salt substitutes.  Do not fry  foods. Lacinda Axon foods using healthy methods such as baking, boiling, grilling, and broiling instead.  Cook with heart-healthy oils, such as olive, canola, soybean, or sunflower oil. Meal planning  Eat a balanced diet that includes: ? 5 or more servings of fruits and vegetables each day. At each meal, try to fill half of your plate with fruits and vegetables. ? Up to 6-8 servings of whole grains each day. ? Less than 6 oz of lean meat,  poultry, or fish each day. A 3-oz serving of meat is about the same size as a deck of cards. One egg equals 1 oz. ? 2 servings of low-fat dairy each day. ? A serving of nuts, seeds, or beans 5 times each week. ? Heart-healthy fats. Healthy fats called Omega-3 fatty acids are found in foods such as flaxseeds and coldwater fish, like sardines, salmon, and mackerel.  Limit how much you eat of the following: ? Canned or prepackaged foods. ? Food that is high in trans fat, such as fried foods. ? Food that is high in saturated fat, such as fatty meat. ? Sweets, desserts, sugary drinks, and other foods with added sugar. ? Full-fat dairy products.  Do not salt foods before eating.  Try to eat at least 2 vegetarian meals each week.  Eat more home-cooked food and less restaurant, buffet, and fast food.  When eating at a restaurant, ask that your food be prepared with less salt or no salt, if possible. What foods are recommended? The items listed may not be a complete list. Talk with your dietitian about what dietary choices are best for you. Grains Whole-grain or whole-wheat bread. Whole-grain or whole-wheat pasta. Oasis Goehring rice. Modena Morrow. Bulgur. Whole-grain and low-sodium cereals. Pita bread. Low-fat, low-sodium crackers. Whole-wheat flour tortillas. Vegetables Fresh or frozen vegetables (raw, steamed, roasted, or grilled). Low-sodium or reduced-sodium tomato and vegetable juice. Low-sodium or reduced-sodium tomato sauce and tomato paste. Low-sodium or reduced-sodium canned vegetables. Fruits All fresh, dried, or frozen fruit. Canned fruit in natural juice (without added sugar). Meat and other protein foods Skinless chicken or [place]. Ground chicken or [place]. Pork with fat trimmed off. Fish and seafood. Egg whites. Dried beans, peas, or lentils. Unsalted nuts, nut butters, and seeds. Unsalted canned beans. Lean cuts of beef with fat trimmed off. Low-sodium, lean deli meat. Dairy Low-fat  (1%) or fat-free (skim) milk. Fat-free, low-fat, or reduced-fat cheeses. Nonfat, low-sodium ricotta or cottage cheese. Low-fat or nonfat yogurt. Low-fat, low-sodium cheese. Fats and oils Soft margarine without trans fats. Vegetable oil. Low-fat, reduced-fat, or light mayonnaise and salad dressings (reduced-sodium). Canola, safflower, olive, soybean, and sunflower oils. Avocado. Seasoning and other foods Herbs. Spices. Seasoning mixes without salt. Unsalted popcorn and pretzels. Fat-free sweets. What foods are not recommended? The items listed may not be a complete list. Talk with your dietitian about what dietary choices are best for you. Grains Baked goods made with fat, such as croissants, muffins, or some breads. Dry pasta or rice meal packs. Vegetables Creamed or fried vegetables. Vegetables in a cheese sauce. Regular canned vegetables (not low-sodium or reduced-sodium). Regular canned tomato sauce and paste (not low-sodium or reduced-sodium). Regular tomato and vegetable juice (not low-sodium or reduced-sodium). Angie Fava. Olives. Fruits Canned fruit in a light or heavy syrup. Fried fruit. Fruit in cream or butter sauce. Meat and other protein foods Fatty cuts of meat. Ribs. Fried meat. Berniece Salines. Sausage. Bologna and other processed lunch meats. Salami. Fatback. Hotdogs. Bratwurst. Salted nuts and seeds. Canned beans with added salt. Canned  or smoked fish. Whole eggs or egg yolks. Chicken or [place] with skin. Dairy Whole or 2% milk, cream, and half-and-half. Whole or full-fat cream cheese. Whole-fat or sweetened yogurt. Full-fat cheese. Nondairy creamers. Whipped toppings. Processed cheese and cheese spreads. Fats and oils Butter. Stick margarine. Lard. Shortening. Ghee. Bacon fat. Tropical oils, such as coconut, palm kernel, or palm oil. Seasoning and other foods Salted popcorn and pretzels. Onion salt, garlic salt, seasoned salt, table salt, and sea salt. Worcestershire sauce. Tartar sauce.  Barbecue sauce. Teriyaki sauce. Soy sauce, including reduced-sodium. Steak sauce. Canned and packaged gravies. Fish sauce. Oyster sauce. Cocktail sauce. Horseradish that you find on the shelf. Ketchup. Mustard. Meat flavorings and tenderizers. Bouillon cubes. Hot sauce and Tabasco sauce. Premade or packaged marinades. Premade or packaged taco seasonings. Relishes. Regular salad dressings. Where to find more information:  National Heart, Lung, and [place]: https://wilson-eaton.com/  American Heart Association: www.heart.org Summary  The DASH eating plan is a healthy eating plan that has been shown to reduce high blood pressure (hypertension). It may also reduce your risk for type 2 diabetes, heart disease, and stroke.  With the DASH eating plan, you should limit salt (sodium) intake to 2,300 mg a day. If you have hypertension, you may need to reduce your sodium intake to 1,500 mg a day.  When on the DASH eating plan, aim to eat more fresh fruits and vegetables, whole grains, lean proteins, low-fat dairy, and heart-healthy fats.  Work with your health care provider or diet and nutrition specialist (dietitian) to adjust your eating plan to your individual calorie needs. This information is not intended to replace advice given to you by your health care provider. Make sure you discuss any questions you have with your health care provider. Document Revised: 04/13/2017 Document Reviewed: 04/24/2016 Elsevier Patient Education  2020 [employer].

## 2019-12-29 NOTE — Telephone Encounter (Signed)
Mrs. Wimes notified that referral has been ordered for dermatology.  Patient wanted to let Dr. [name] know that Dr. [name] thinks her foot problem could be coming from her "brain medication" he has her on so he has stopped all that medication.  She will move forward with the referral since it may take a couple of weeks to get scheduled but if by that time her feet are better due to stopping the medications, she will cancel referral appointment.

## 2019-12-31 ENCOUNTER — Encounter: Payer: Self-pay | Admitting: *Deleted

## 2020-01-21 ENCOUNTER — Ambulatory Visit: Payer: PPO | Admitting: Neurology

## 2020-01-21 ENCOUNTER — Encounter: Payer: Self-pay | Admitting: Neurology

## 2020-01-21 VITALS — BP 143/87 | HR 78 | Ht 61.75 in | Wt 139.0 lb

## 2020-01-21 DIAGNOSIS — G3184 Mild cognitive impairment, so stated: Secondary | ICD-10-CM

## 2020-01-21 MED ORDER — MEMANTINE HCL 5 MG PO TABS: 5.0000 mg | ORAL_TABLET | Freq: Every day | ORAL | 3 refills | Status: DC

## 2020-01-21 NOTE — Progress Notes (Signed)
PATIENT: Janet Chapman DOB: [birth year]  REASON FOR VISIT: follow up HISTORY FROM: patient  HISTORY OF PRESENT ILLNESS: Today 01/21/20 Ms. Latif is an 84 year old female with history of memory problems, CT head was normal for age.  When last seen, she developed a rash to her feet, we stopped Exelon capsules, the rash cleared.  It is almost completely gone.  With Exelon patches, she had blisters.  Is here requesting to start another medication for memory.  Her husband notices a difference in her memory and her mood on medication.  She is calmer and has a better attitude.  She takes Prevagen from time to time.  Presents today for evaluation accompanied by her husband.  HISTORY 12/22/2019 SS:Ms. [name] 84 year old female with history of memory problems. CT of the head was normal for age. The Exelon patch caused skin irritation, is now on low-dose capsules. Feels the Exelon capsules are causing rash to her left foot, nausea, dizziness, and itching at night. Her husband notices a difference in her memory when she is on the medications, she takes Prevagen from time to time. Has noticed difficulty with short-term memory, will misplace things.  She has not given up anything because of memory.  She continues to drive well, manages her medications, and the household.  She remains active socially.  No recent falls.  She presents today for evaluation accompanied by her husband.   REVIEW OF SYSTEMS: Out of a complete 14 system review of symptoms, the patient complains only of the following symptoms, and all other reviewed systems are negative.  Memory loss  ALLERGIES: Allergies  Allergen Reactions   Codeine Nausea Only   Exelon [Rivastigmine]     Rash on chest and feet    HOME MEDICATIONS: Outpatient Medications Prior to Visit  Medication Sig Dispense Refill   Apoaequorin (PREVAGEN PO) Take 1 tablet by mouth in the morning and at bedtime.     aspirin 81 MG tablet Take 81 mg by mouth at bedtime.       Azelastine HCl 0.15 % SOLN May use 1-2 sprays per nostril up to twice a day as needed for sinus drainage. 30 mL 5   fluticasone (FLONASE) 50 MCG/ACT nasal spray Place 1 spray into both nostrils in the morning and at bedtime. For nasal congestion. 16 g 5   ketoconazole (NIZORAL) 2 % shampoo Apply 1 application topically 2 (two) times a week. 120 mL 0   losartan-hydrochlorothiazide (HYZAAR) 100-25 MG tablet Take 1 tablet by mouth daily. 90 tablet 1   metoprolol succinate (TOPROL-XL) 50 MG 24 hr tablet TAKE 1 TABLET BY MOUTH DAILY 90 tablet 1   montelukast (SINGULAIR) 10 MG tablet TAKE 1 TABLET BY MOUTH AT BEDTIME 30 tablet 2   simvastatin (ZOCOR) 40 MG tablet TAKE 1 TABLET BY MOUTH EACH NIGHT AT BEDTIME 90 tablet 3   triamcinolone cream (KENALOG) 0.5 % Apply 1 application topically 2 (two) times daily. 30 g 0   No facility-administered medications prior to visit.    PAST MEDICAL HISTORY: Past Medical History:  Diagnosis Date   Allergic rhinitis    Diabetes mellitus without complication (HCC)    diet controlled   GERD (gastroesophageal reflux disease)    Hyperlipidemia    Hypertension    Memory loss    Osteoarthritis    Urinary incontinence     PAST SURGICAL HISTORY: Past Surgical History:  Procedure Laterality Date   ABDOMINAL HYSTERECTOMY  1993   TA   CARDIOVASCULAR STRESS TEST  2004   H. [name], cardiolite-normal   CHOLECYSTECTOMY  5102'H   COLONOSCOPY     LEFT HEART CATH AND CORONARY ANGIOGRAPHY N/A 11/06/2017   Procedure: LEFT HEART CATH AND CORONARY ANGIOGRAPHY;  Surgeon: Burnell Blanks, MD;  Location: [facility];  Service: Cardiovascular;  Laterality: N/A;    FAMILY HISTORY: Family History  Problem Relation Age of Onset   Heart attack Father 84   Dementia Mother    Stroke Mother 32   Ovarian cancer Sister 80   Diabetes Other        Maternal side   Ovarian cancer Other        Grandmother   Ovarian cancer Other         aunt   Colon cancer Neg Hx     SOCIAL HISTORY: Social History   Socioeconomic History   Marital status: Married    Spouse name: Not on file   Number of children: 0   Years of education: Not on file   Highest education level: Not on file  Occupational History   Occupation: retired  Tobacco Use   Smoking status: Former Smoker   Smokeless tobacco: Never Used   Tobacco comment: In college  Vaping Use   Vaping Use: Never used  Substance and Sexual Activity   Alcohol use: Yes    Comment: 1 glass of wine per month   Drug use: No   Sexual activity: Never  Other Topics Concern   Not on file  Social History Narrative    Regular exercise: yes, Curves 2x a week   Married 48 + years    Diet: fruit and veggies, no FF, some water, artificial sweetener   HCPOA: [name], has living will, full code ( reviewed 2014)            Social Determinants of Health   Financial Resource Strain:    Difficulty of Paying Living Expenses: Not on file  Food Insecurity:    Worried About Charity fundraiser in the Last Year: Not on file   [employer] of Food in the Last Year: Not on file  Transportation Needs:    Lack of Transportation (Medical): Not on file   Lack of Transportation (Non-Medical): Not on file  Physical Activity:    Days of Exercise per Week: Not on file   Minutes of Exercise per Session: Not on file  Stress:    Feeling of Stress : Not on file  Social Connections:    Frequency of Communication with Friends and Family: Not on file   Frequency of Social Gatherings with Friends and Family: Not on file   Attends Religious Services: Not on file   Active Member of Clubs or Organizations: Not on file   Attends Archivist Meetings: Not on file   Marital Status: Not on file  Intimate Partner Violence:    Fear of Current or Ex-Partner: Not on file   Emotionally Abused: Not on file   Physically Abused: Not on file   Sexually Abused: Not on  file   PHYSICAL EXAM  Vitals:   01/21/20 0801  BP: (!) 143/87  Pulse: 78  Weight: 139 lb (63 kg)  Height: 5' 1.75" (1.568 m)   Body mass index is 25.63 kg/m.  Generalized: Well developed, in no acute distress  MMSE - Mini Mental State Exam 12/22/2019 06/18/2019 12/04/2017  Orientation to time 5 5 5   Orientation to Place 5 5 5   Registration 3 3  3  Attention/ Calculation 5 3 0  Recall 1 2 3   Language- name 2 objects 2 2 0  Language- repeat 1 1 1   Language- follow 3 step command 3 3 3   Language- read & follow direction 1 1 0  Write a sentence 1 1 0  Copy design 1 1 0  Total score 28 27 20     Neurological examination  Mentation: Alert oriented to time, place, history taking. Follows all commands speech and language fluent Cranial nerve II-XII: Pupils were equal round reactive to light. Extraocular movements were full, visual field were full on confrontational test. Facial sensation and strength were normal. Head turning and shoulder shrug  were normal and symmetric. Motor: The motor testing reveals 5 over 5 strength of all 4 extremities. Good symmetric motor tone is noted throughout.  Sensory: Sensory testing is intact to soft touch on all 4 extremities. No evidence of extinction is noted.  Coordination: Cerebellar testing reveals good finger-nose-finger and heel-to-shin bilaterally.  Gait and station: Gait is normal, no assistive device Reflexes: Deep tendon reflexes are symmetric and normal bilaterally.   DIAGNOSTIC DATA (LABS, IMAGING, TESTING) - I reviewed patient records, labs, notes, testing and imaging myself where available.  Lab Results  Component Value Date   WBC 5.7 12/18/2018   HGB 14.3 12/18/2018   HCT 42.1 12/18/2018   MCV 95.8 12/18/2018   PLT 215.0 12/18/2018      Component Value Date/Time   NA 141 12/18/2018 0812   NA 138 09/04/2018 1537   K 3.8 12/18/2018 0812   CL 96 12/18/2018 0812   CO2 36 (H) 12/18/2018 0812   GLUCOSE 102 (H) 12/18/2018 0812    BUN 20 12/18/2018 0812   BUN 20 09/04/2018 1537   CREATININE 0.85 12/18/2018 0812   CALCIUM 10.4 12/18/2018 0812   PROT 7.0 12/18/2018 0812   ALBUMIN 4.8 12/18/2018 0812   AST 15 12/18/2018 0812   ALT 12 12/18/2018 0812   ALKPHOS 62 12/18/2018 0812   BILITOT 0.7 12/18/2018 0812   GFRNONAA 63 09/04/2018 1537   GFRAA 73 09/04/2018 1537   Lab Results  Component Value Date   CHOL 162 12/18/2018   HDL 62.80 12/18/2018   LDLCALC 85 12/18/2018   LDLDIRECT 119.8 04/26/2009   TRIG 73.0 12/18/2018   CHOLHDL 3 12/18/2018   Lab Results  Component Value Date   HGBA1C 6.4 12/18/2018   Lab Results  Component Value Date   VITAMINB12 461 06/18/2019   Lab Results  Component Value Date   TSH 3.92 12/18/2018      ASSESSMENT AND PLAN 84 y.o. year old female  has a past medical history of Allergic rhinitis, Diabetes mellitus without complication ([place]), GERD (gastroesophageal reflux disease), Hyperlipidemia, Hypertension, Memory loss, Osteoarthritis, and Urinary incontinence. here with:  1.  Mild cognitive impairment  -Remain off Exelon capsules, felt to be cause of rash, the patches resulted in blisters  -Start Namenda very low-dose 5 mg daily, if tolerating well after 4 to 6 weeks with no adverse effect will increase dosing  -Keep follow-up appointment in February  I spent 20 minutes of face-to-face and non-face-to-face time with patient.  This included previsit chart review, lab review, study review, order entry, electronic health record documentation, patient education.  Butler Denmark, AGNP-C, DNP 01/21/2020, 8:23 AM [facility] [address], [place], [place] [address] [phone]

## 2020-01-21 NOTE — Patient Instructions (Addendum)
Stop the Exelon  Start Namenda 5 mg daily If you tolerate well after 4-6 week, we can increase the dosing Memantine Tablets What is this medicine? MEMANTINE (MEM an teen) is used to treat dementia caused by Alzheimer's disease. This medicine may be used for other purposes; ask your health care provider or pharmacist if you have questions. COMMON BRAND NAME(S): Namenda What should I tell my health care provider before I take this medicine? They need to know if you have any of these conditions:  difficulty passing urine  kidney disease  liver disease  seizures  an unusual or allergic reaction to memantine, other medicines, foods, dyes, or preservatives  pregnant or trying to get pregnant  breast-feeding How should I use this medicine? Take this medicine by mouth with a glass of water. Follow the directions on the prescription label. You may take this medicine with or without food. Take your doses at regular intervals. Do not take your medicine more often than directed. Continue to take your medicine even if you feel better. Do not stop taking except on the advice of your doctor or health care professional. Talk to your pediatrician regarding the use of this medicine in children. Special care may be needed. Overdosage: If you think you have taken too much of this medicine contact a poison control center or emergency room at once. NOTE: This medicine is only for you. Do not share this medicine with others. What if I miss a dose? If you miss a dose, take it as soon as you can. If it is almost time for your next dose, take only that dose. Do not take double or extra doses. If you do not take your medicine for several days, contact your health care provider. Your dose may need to be changed. What may interact with this  medicine?  acetazolamide  amantadine  cimetidine  dextromethorphan  dofetilide  hydrochlorothiazide  ketamine  metformin  methazolamide  quinidine  ranitidine  sodium bicarbonate  triamterene This list may not describe all possible interactions. Give your health care provider a list of all the medicines, herbs, non-prescription drugs, or dietary supplements you use. Also tell them if you smoke, drink alcohol, or use illegal drugs. Some items may interact with your medicine. What should I watch for while using this medicine? Visit your doctor or health care professional for regular checks on your progress. Check with your doctor or health care professional if there is no improvement in your symptoms or if they get worse. You may get drowsy or dizzy. Do not drive, use machinery, or do anything that needs mental alertness until you know how this drug affects you. Do not stand or sit up quickly, especially if you are an older patient. This reduces the risk of dizzy or fainting spells. Alcohol can make you more drowsy and dizzy. Avoid alcoholic drinks. What side effects may I notice from receiving this medicine? Side effects that you should report to your doctor or health care professional as soon as possible:  allergic reactions like skin rash, itching or hives, swelling of the face, lips, or tongue  agitation or a feeling of restlessness  depressed mood  dizziness  hallucinations  redness, blistering, peeling or loosening of the skin, including inside the mouth  seizures  vomiting Side effects that usually do not require medical attention (report to your doctor or health care professional if they continue or are bothersome):  constipation  diarrhea  headache  nausea  trouble sleeping This list  may not describe all possible side effects. Call your doctor for medical advice about side effects. You may report side effects to FDA at 1-800-FDA-1088. Where should I  keep my medicine? Keep out of the reach of children. Store at room temperature between 15 degrees and 30 degrees C (59 degrees and 86 degrees F). Throw away any unused medicine after the expiration date. NOTE: This sheet is a summary. It may not cover all possible information. If you have questions about this medicine, talk to your doctor, pharmacist, or health care provider.  2020 Elsevier/Gold Standard (2013-02-17 14:10:42)

## 2020-01-21 NOTE — Progress Notes (Signed)
I have read the note, and I agree with the clinical assessment and plan.  Janet Chapman   

## 2020-01-26 ENCOUNTER — Telehealth: Payer: Self-pay | Admitting: Family Medicine

## 2020-01-26 NOTE — Telephone Encounter (Signed)
Please schedule MWV with nurse and CPE with Dr. Bedsole. 

## 2020-01-29 NOTE — Telephone Encounter (Signed)
Labs 10/21 Medicare 10/25 cpx 10/28

## 2020-02-09 ENCOUNTER — Telehealth: Payer: Self-pay | Admitting: Family Medicine

## 2020-02-09 NOTE — Telephone Encounter (Signed)
LVM FOR PT TO RTN MY CALL TO R/S APPT WITH [facility] ON 03/04/20

## 2020-02-10 ENCOUNTER — Other Ambulatory Visit: Payer: Self-pay | Admitting: Family Medicine

## 2020-02-11 ENCOUNTER — Telehealth: Payer: Self-pay | Admitting: Neurology

## 2020-02-11 MED ORDER — MEMANTINE HCL 10 MG PO TABS: 10.0000 mg | ORAL_TABLET | Freq: Two times a day (BID) | ORAL | 5 refills | Status: DC

## 2020-02-11 NOTE — Telephone Encounter (Signed)
Attempted to call pt, left detailed VM from NP

## 2020-02-11 NOTE — Addendum Note (Signed)
Addended by: Suzzanne Cloud on: 02/11/2020 11:09 AM   Modules accepted: Orders

## 2020-02-11 NOTE — Telephone Encounter (Signed)
Received faxed note from patient with the following message:   "The latest medication I am taking is not helping at all. My husband and I have discussed what our next step could be rather than just keep going downhill. We have agreed that since the revastigmine patches worked so well for 2 to 3 weeks before the red stops showed up and that I seemed more alert, more and not depressed, we would like to try it again.   We can keep a close watch on the patches for about 2 or 3 weeks. If no problem by then, I could take a break for a week (or longer) and try for another 2 or 3 weeks.   Please let me know if this sounds reasonable or if you have another suggestion. I still have a box of 30 patches."

## 2020-02-11 NOTE — Telephone Encounter (Signed)
She was started on very low dose Namenda 5 mg daily, the full dose is 10 mg  Twice daily, we can increase the dosing. If she still wants to try the Exelon patch again, even though it reportedly caused blisters, she can try again, just closely monitor for adverse event. I would not increase Namenda and restart Exelon patches at the same time, do one or the other.

## 2020-02-11 NOTE — Telephone Encounter (Signed)
I'll send in full dose Namenda 10 mg twice daily, have her take 1/2 tablet twice daily for 2 weeks then increase to 1 tablet twice daily.

## 2020-02-11 NOTE — Telephone Encounter (Signed)
Spoke to pt,  She states she didn't realize she was on low dose of namenda and is interested in increasing this medication.

## 2020-02-12 NOTE — Telephone Encounter (Signed)
Spoke to pt,  Called to make sure she got my VM and understood Namenda instructions from Judson Roch NP   Pt voiced understanding

## 2020-02-16 LAB — HM DIABETES FOOT EXAM

## 2020-02-25 ENCOUNTER — Ambulatory Visit: Payer: PPO | Admitting: Allergy

## 2020-03-01 ENCOUNTER — Other Ambulatory Visit: Payer: Self-pay | Admitting: Family Medicine

## 2020-03-02 ENCOUNTER — Other Ambulatory Visit: Payer: Self-pay | Admitting: Family Medicine

## 2020-03-04 ENCOUNTER — Telehealth: Payer: Self-pay | Admitting: Family Medicine

## 2020-03-04 ENCOUNTER — Other Ambulatory Visit (INDEPENDENT_AMBULATORY_CARE_PROVIDER_SITE_OTHER): Payer: PPO

## 2020-03-04 ENCOUNTER — Other Ambulatory Visit: Payer: PPO

## 2020-03-04 ENCOUNTER — Other Ambulatory Visit: Payer: Self-pay

## 2020-03-04 DIAGNOSIS — E088 Diabetes mellitus due to underlying condition with unspecified complications: Secondary | ICD-10-CM

## 2020-03-04 DIAGNOSIS — E118 Type 2 diabetes mellitus with unspecified complications: Secondary | ICD-10-CM | POA: Diagnosis not present

## 2020-03-04 NOTE — Telephone Encounter (Signed)
-----   Message from Ellamae Sia sent at 03/03/2020  4:07 PM EDT ----- Regarding: Lab orders for Thursday, 10.21.21 Patient is scheduled for CPX labs, please order future labs, Thanks , Terri   Late add on

## 2020-03-05 ENCOUNTER — Encounter: Payer: Self-pay | Admitting: Family Medicine

## 2020-03-05 DIAGNOSIS — H524 Presbyopia: Secondary | ICD-10-CM | POA: Diagnosis not present

## 2020-03-05 DIAGNOSIS — H35363 Drusen (degenerative) of macula, bilateral: Secondary | ICD-10-CM | POA: Diagnosis not present

## 2020-03-05 DIAGNOSIS — E119 Type 2 diabetes mellitus without complications: Secondary | ICD-10-CM | POA: Diagnosis not present

## 2020-03-05 DIAGNOSIS — H52223 Regular astigmatism, bilateral: Secondary | ICD-10-CM | POA: Diagnosis not present

## 2020-03-05 DIAGNOSIS — H17822 Peripheral opacity of cornea, left eye: Secondary | ICD-10-CM | POA: Diagnosis not present

## 2020-03-05 DIAGNOSIS — H43813 Vitreous degeneration, bilateral: Secondary | ICD-10-CM | POA: Diagnosis not present

## 2020-03-05 LAB — HEMOGLOBIN A1C: Hgb A1c MFr Bld: 6.5 % (ref 4.6–6.5)

## 2020-03-05 LAB — COMPREHENSIVE METABOLIC PANEL
ALT: 11 U/L (ref 0–35)
AST: 15 U/L (ref 0–37)
Albumin: 4.6 g/dL (ref 3.5–5.2)
Alkaline Phosphatase: 62 U/L (ref 39–117)
BUN: 18 mg/dL (ref 6–23)
CO2: 37 mEq/L — ABNORMAL HIGH (ref 19–32)
Calcium: 9.8 mg/dL (ref 8.4–10.5)
Chloride: 95 mEq/L — ABNORMAL LOW (ref 96–112)
Creatinine, Ser: 0.79 mg/dL (ref 0.40–1.20)
GFR: 69.77 mL/min (ref >60.00)
Glucose, Bld: 116 mg/dL — ABNORMAL HIGH (ref 70–99)
Potassium: 3.6 mEq/L (ref 3.5–5.1)
Sodium: 138 mEq/L (ref 135–145)
Total Bilirubin: 0.8 mg/dL (ref 0.2–1.2)
Total Protein: 6.7 g/dL (ref 6.0–8.3)

## 2020-03-05 LAB — LIPID PANEL
Cholesterol: 182 mg/dL (ref 0–200)
HDL: 75.9 mg/dL (ref >39.00)
LDL Cholesterol: 92 mg/dL (ref 0–99)
NonHDL: 105.64
Total CHOL/HDL Ratio: 2
Triglycerides: 66 mg/dL (ref 0.0–149.0)
VLDL: 13.2 mg/dL (ref 0.0–40.0)

## 2020-03-05 LAB — HM DIABETES EYE EXAM

## 2020-03-05 NOTE — Progress Notes (Signed)
No critical labs need to be addressed urgently. We will discuss labs in detail at upcoming office visit.   

## 2020-03-08 ENCOUNTER — Ambulatory Visit: Payer: PPO | Admitting: Allergy

## 2020-03-08 ENCOUNTER — Ambulatory Visit: Payer: PPO

## 2020-03-08 NOTE — Progress Notes (Deleted)
Follow Up Note  RE: Janet Chapman MRN: [id number] DOB: [birth year] Date of Office Visit: 03/08/2020  Referring provider: [name], MD Primary care provider: [name], MD  Chief Complaint: No chief complaint on file.  History of Present Illness: I had the pleasure of seeing Janet Chapman for a follow up visit at the [facility] on 03/08/2020. She is a 84 y.o. female, who is being followed for allergic rhinitis. Her previous allergy office visit was on 08/25/2019 with Dr. Maudie Mercury. Today is a regular follow up visit.  Other allergic rhinitis Past history - Rhinitis symptoms with main complaint of postnasal drip since February 2020.  CT sinus in October 2020 showed minimal mucosal thickening and possibly a retention cyst/polyp in the right maxillary sinus.  She was evaluated by ENT. Denies heartburn/reflux symptoms. 2020 skin testing showed: Positive to molds, dog and dust mites. Interim history - symptoms improved but lately noticing some congestion. Stopped Flonase.   Continue environmental control measures.  Continue azelastine nasal spray 1-2 sprays per nostril up to twice a day for sinus drainage.  Restart fluticasone 1 spray per nostril twice a day as needed for nasal congestion.   Nasal saline spray (i.e., Simply Saline) or nasal saline lavage (i.e., NeilMed) is recommended as needed and prior to medicated nasal sprays.  May use over the counter antihistamines such as Zyrtec (cetirizine), Claritin (loratadine), Allegra (fexofenadine), or Xyzal (levocetirizine) daily as needed.  Continue montelukast 10mg  daily.   Return in about 6 months (around 02/24/2020).  Follow up with PCP regarding her blood pressure.  Assessment and Plan: [name] is a 84 y.o. female with: No problem-specific Assessment & Plan notes found for this encounter.  No follow-ups on file.  No orders of the defined types were placed in this encounter.  Lab Orders  No laboratory  test(s) ordered today    Diagnostics: Spirometry:  Tracings reviewed. Her effort: {Blank single:19197::"Good reproducible efforts.","It was hard to get consistent efforts and there is a question as to whether this reflects a maximal maneuver.","Poor effort, data can not be interpreted."} FVC: ***L FEV1: ***L, ***% predicted FEV1/FVC ratio: ***% Interpretation: {Blank single:19197::"Spirometry consistent with mild obstructive disease","Spirometry consistent with moderate obstructive disease","Spirometry consistent with severe obstructive disease","Spirometry consistent with possible restrictive disease","Spirometry consistent with mixed obstructive and restrictive disease","Spirometry uninterpretable due to technique","Spirometry consistent with normal pattern","No overt abnormalities noted given today's efforts"}.  Please see scanned spirometry results for details.  Skin Testing: {Blank single:19197::"Select foods","Environmental allergy panel","Environmental allergy panel and select foods","Food allergy panel","None","Deferred due to recent antihistamines use"}. Positive test to: ***. Negative test to: ***.  Results discussed with patient/family.   Medication List:  Current Outpatient Medications  Medication Sig Dispense Refill  . Apoaequorin (PREVAGEN PO) Take 1 tablet by mouth in the morning and at bedtime.    Marland Kitchen aspirin 81 MG tablet Take 81 mg by mouth at bedtime.     . Azelastine HCl 0.15 % SOLN May use 1-2 sprays per nostril up to twice a day as needed for sinus drainage. 30 mL 5  . fluticasone (FLONASE) 50 MCG/ACT nasal spray Place 1 spray into both nostrils in the morning and at bedtime. For nasal congestion. 16 g 5  . ketoconazole (NIZORAL) 2 % shampoo Apply 1 application topically 2 (two) times a week. 120 mL 0  . losartan-hydrochlorothiazide (HYZAAR) 100-25 MG tablet Take 1 tablet by mouth daily. 90 tablet 1  . memantine (NAMENDA) 10 MG tablet Take 1 tablet (  10 mg total) by mouth  2 (two) times daily. 60 tablet 5  . metoprolol succinate (TOPROL-XL) 50 MG 24 hr tablet TAKE 1 TABLET BY MOUTH DAILY 90 tablet 0  . montelukast (SINGULAIR) 10 MG tablet TAKE 1 TABLET BY MOUTH AT BEDTIME 30 tablet 2  . simvastatin (ZOCOR) 40 MG tablet TAKE 1 TABLET BY MOUTH DAILY AT BEDTIME 90 tablet 0  . triamcinolone cream (KENALOG) 0.5 % Apply 1 application topically 2 (two) times daily. 30 g 0   No current facility-administered medications for this visit.   Allergies: Allergies  Allergen Reactions  . Codeine Nausea Only  . Exelon [Rivastigmine]     Rash on chest and feet   I reviewed her past medical history, social history, family history, and environmental history and no significant changes have been reported from her previous visit.  Review of Systems  Constitutional: Negative for appetite change, chills, fever and unexpected weight change.  HENT: Positive for congestion and postnasal drip.   Eyes: Negative for itching.  Respiratory: Negative for cough, chest tightness, shortness of breath and wheezing.   Cardiovascular: Negative for chest pain.  Gastrointestinal: Negative for abdominal pain.  Genitourinary: Negative for difficulty urinating.  Skin: Negative for rash.  Allergic/Immunologic: Positive for environmental allergies. Negative for food allergies.  Neurological: Negative for headaches.   Objective: There were no vitals taken for this visit. There is no height or weight on file to calculate BMI. Physical Exam Vitals and nursing note reviewed.  Constitutional:      Appearance: She is well-developed.  HENT:     Head: Normocephalic and atraumatic.     Right Ear: External ear normal.     Left Ear: External ear normal.     Nose: Mucosal edema present.     Mouth/Throat:     Mouth: Mucous membranes are moist.     Pharynx: Oropharynx is clear.  Eyes:     Conjunctiva/sclera: Conjunctivae normal.  Cardiovascular:     Rate and Rhythm: Normal rate and regular rhythm.       Heart sounds: Normal heart sounds. No murmur heard.  No friction rub. No gallop.   Pulmonary:     Effort: Pulmonary effort is normal.     Breath sounds: Normal breath sounds. No wheezing or rales.  Musculoskeletal:     Cervical back: Neck supple.  Skin:    General: Skin is warm.     Findings: No rash.  Neurological:     Mental Status: She is alert and oriented to person, place, and time.  Psychiatric:        Behavior: Behavior normal.    Previous notes and tests were reviewed. The plan was reviewed with the patient/family, and all questions/concerned were addressed.  It was my pleasure to see Shelia today and participate in her care. Please feel free to contact me with any questions or concerns.  Sincerely,  [name], DO Allergy & Immunology  [facility] office: [place] office: [phone]

## 2020-03-11 ENCOUNTER — Ambulatory Visit (INDEPENDENT_AMBULATORY_CARE_PROVIDER_SITE_OTHER): Payer: PPO | Admitting: Family Medicine

## 2020-03-11 ENCOUNTER — Encounter: Payer: Self-pay | Admitting: Family Medicine

## 2020-03-11 ENCOUNTER — Other Ambulatory Visit: Payer: Self-pay

## 2020-03-11 VITALS — BP 120/70 | HR 64 | Temp 98.5°F | Ht 62.0 in | Wt 138.8 lb

## 2020-03-11 DIAGNOSIS — Z0000 Encounter for general adult medical examination without abnormal findings: Secondary | ICD-10-CM | POA: Diagnosis not present

## 2020-03-11 DIAGNOSIS — E1159 Type 2 diabetes mellitus with other circulatory complications: Secondary | ICD-10-CM | POA: Diagnosis not present

## 2020-03-11 DIAGNOSIS — E1169 Type 2 diabetes mellitus with other specified complication: Secondary | ICD-10-CM | POA: Diagnosis not present

## 2020-03-11 DIAGNOSIS — E785 Hyperlipidemia, unspecified: Secondary | ICD-10-CM

## 2020-03-11 DIAGNOSIS — E0821 Diabetes mellitus due to underlying condition with diabetic nephropathy: Secondary | ICD-10-CM

## 2020-03-11 DIAGNOSIS — I25110 Atherosclerotic heart disease of native coronary artery with unstable angina pectoris: Secondary | ICD-10-CM | POA: Diagnosis not present

## 2020-03-11 DIAGNOSIS — G3184 Mild cognitive impairment, so stated: Secondary | ICD-10-CM | POA: Diagnosis not present

## 2020-03-11 DIAGNOSIS — I152 Hypertension secondary to endocrine disorders: Secondary | ICD-10-CM | POA: Diagnosis not present

## 2020-03-11 DIAGNOSIS — Z23 Encounter for immunization: Secondary | ICD-10-CM | POA: Diagnosis not present

## 2020-03-11 NOTE — Assessment & Plan Note (Signed)
Diet controlled.  

## 2020-03-11 NOTE — Assessment & Plan Note (Signed)
Followoed by cardiology.

## 2020-03-11 NOTE — Assessment & Plan Note (Signed)
Followed by neurology.   

## 2020-03-11 NOTE — Progress Notes (Signed)
Chief Complaint  Patient presents with  . Medicare Wellness    History of Present Illness: HPI  The patient presents for annual medicare wellness, complete physical and review of chronic health problems. He/She also has the following acute concerns today: none  I have personally reviewed the Medicare Annual Wellness questionnaire and have noted 1. The patient's medical and social history 2. Their use of alcohol, tobacco or illicit drugs 3. Their current medications and supplements 4. The patient's functional ability including ADL's, fall risks, home safety risks and hearing or visual             impairment. 5. Diet and physical activities 6. Evidence for depression or mood disorders 7.         Updated provider list Cognitive evaluation was performed and recorded on pt medicare questionnaire form. The patients weight, height, BMI and visual acuity have been recorded in the chart  I have made referrals, counseling and provided education to the patient based review of the above and I have provided the pt with a written personalized care plan for preventive services.   Documentation of this information was scanned into the electronic record under the media tab.   Advance directives and end of life planning reviewed in detail with patient and documented in EMR. Patient given handout on advance care directives if needed. HCPOA and living will updated if needed.   Hearing Screening   Method: Audiometry   125Hz  250Hz  500Hz  1000Hz  2000Hz  3000Hz  4000Hz  6000Hz  8000Hz   Right ear:   20 20 20   0    Left ear:   20 20 20  20     Vision Screening Comments: Wears Glasses. Eye Exam with Dr. Jerline Pain  at [facility] 03/05/2020   Fall Risk  03/11/2020 12/11/2019 12/20/2018 12/04/2017 11/27/2016  Falls in the past year? 0 0 0 No No  Comment - Emmi Telephone Survey: data to providers prior to load - - -     Office Visit from 03/11/2020 in [place] at [facility] Total Score 0       Diabetes:   Diet controlled. Lab Results  Component Value Date   HGBA1C 6.5 03/04/2020  Using medications without difficulties: Hypoglycemic episodes:none Hyperglycemic episodes:none Feet problems:  No ulcers Blood Sugars averaging: not checking eye exam within last year: yes   Elevated Cholesterol:  LDL almost at goal < 70 on statin Lab Results  Component Value Date   CHOL 182 03/04/2020   HDL 75.90 03/04/2020   LDLCALC 92 03/04/2020   LDLDIRECT 119.8 04/26/2009   TRIG 66.0 03/04/2020   CHOLHDL 2 03/04/2020  Using medications without problems: Muscle aches:  Diet compliance: She has been less hungry lately and is losing some wiehgt Exercise: occ walking  Other complaints:  Hypertension:   At goal on metoprolol, losartan HCTZ  BP Readings from Last 3 Encounters:  03/11/20 120/70  01/21/20 (!) 143/87  12/22/19 (!) 149/70  Using medication without problems or lightheadedness:  none Chest pain with exertion:none Edema:none Short of breath: none Average home BPs: Other issues:  Mild  Cognitive impaiment followed by Neurology Reviewed last OV 01/21/2020 SE to Exelon.. rash Started trial of namenda.. SE to higher dose.. can tolerate 5 mg dose BID  Has follow up in february  This visit occurred during the SARS-CoV-2 public health emergency.  Safety protocols were in place, including screening questions prior to the visit, additional usage of staff PPE, and extensive cleaning of exam room while observing appropriate contact  time as indicated for disinfecting solutions.   COVID 19 screen:  No recent travel or known exposure to COVID19 The patient denies respiratory symptoms of COVID 19 at this time. The importance of social distancing was discussed today.     ROS    Past Medical History:  Diagnosis Date  . Allergic rhinitis   . Diabetes mellitus without complication (HCC)    diet controlled  . GERD (gastroesophageal reflux disease)   . Hyperlipidemia   .  Hypertension   . Memory loss   . Osteoarthritis   . Urinary incontinence     reports that she has quit smoking. She has never used smokeless tobacco. She reports current alcohol use. She reports that she does not use drugs.   Current Outpatient Medications:  .  aspirin 81 MG tablet, Take 81 mg by mouth at bedtime. , Disp: , Rfl:  .  Azelastine HCl 0.15 % SOLN, May use 1-2 sprays per nostril up to twice a day as needed for sinus drainage., Disp: 30 mL, Rfl: 5 .  fluticasone (FLONASE) 50 MCG/ACT nasal spray, Place 1 spray into both nostrils daily as needed for allergies or rhinitis., Disp: , Rfl:  .  ketoconazole (NIZORAL) 2 % shampoo, Apply 1 application topically 2 (two) times a week., Disp: 120 mL, Rfl: 0 .  losartan-hydrochlorothiazide (HYZAAR) 100-25 MG tablet, Take 1 tablet by mouth daily., Disp: 90 tablet, Rfl: 1 .  metoprolol succinate (TOPROL-XL) 50 MG 24 hr tablet, Take 50 mg by mouth daily. Take with or immediately following a meal., Disp: , Rfl:  .  montelukast (SINGULAIR) 10 MG tablet, TAKE 1 TABLET BY MOUTH AT BEDTIME, Disp: 30 tablet, Rfl: 2 .  rivastigmine (EXELON) 1.5 MG capsule, Take 1.5 mg by mouth daily., Disp: , Rfl:  .  simvastatin (ZOCOR) 40 MG tablet, TAKE 1 TABLET BY MOUTH DAILY AT BEDTIME, Disp: 90 tablet, Rfl: 0 .  triamcinolone cream (KENALOG) 0.5 %, Apply 1 application topically 2 (two) times daily as needed., Disp: , Rfl:  .  Apoaequorin (PREVAGEN PO), Take 1 tablet by mouth in the morning and at bedtime. (Patient not taking: Reported on 03/11/2020), Disp: , Rfl:    Observations/Objective: Blood pressure 120/70, pulse 64, temperature 98.5 F (36.9 C), temperature source Temporal, height 5\' 2"  (1.575 m), weight 138 lb 12 oz (62.9 kg), SpO2 97 %. Body mass index is 25.38 kg/m. Wt Readings from Last 3 Encounters:  03/11/20 138 lb 12 oz (62.9 kg)  01/21/20 139 lb (63 kg)  12/22/19 141 lb (64 kg)    Physical Exam Constitutional:      General: She is not in  acute distress.    Appearance: Normal appearance. She is well-developed. She is not ill-appearing or toxic-appearing.  HENT:     Head: Normocephalic.     Right Ear: Hearing, tympanic membrane, ear canal and external ear normal. Tympanic membrane is not erythematous, retracted or bulging.     Left Ear: Hearing, tympanic membrane, ear canal and external ear normal. Tympanic membrane is not erythematous, retracted or bulging.     Nose: No mucosal edema or rhinorrhea.     Right Sinus: No maxillary sinus tenderness or frontal sinus tenderness.     Left Sinus: No maxillary sinus tenderness or frontal sinus tenderness.     Mouth/Throat:     Pharynx: Uvula midline.  Eyes:     General: Lids are normal. Lids are everted, no foreign bodies appreciated.     Conjunctiva/sclera: Conjunctivae normal.  Pupils: Pupils are equal, round, and reactive to light.  Neck:     Thyroid: No thyroid mass or thyromegaly.     Vascular: No carotid bruit.     Trachea: Trachea normal.  Cardiovascular:     Rate and Rhythm: Normal rate and regular rhythm.     Pulses: Normal pulses.     Heart sounds: Normal heart sounds, S1 normal and S2 normal. No murmur heard.  No friction rub. No gallop.   Pulmonary:     Effort: Pulmonary effort is normal. No tachypnea or respiratory distress.     Breath sounds: Normal breath sounds. No decreased breath sounds, wheezing, rhonchi or rales.  Abdominal:     General: Bowel sounds are normal.     Palpations: Abdomen is soft.     Tenderness: There is no abdominal tenderness.  Musculoskeletal:     Cervical back: Normal range of motion and neck supple.  Skin:    General: Skin is warm and dry.     Findings: No rash.  Neurological:     Mental Status: She is alert.  Psychiatric:        Mood and Affect: Mood is not anxious or depressed.        Speech: Speech normal.        Behavior: Behavior normal. Behavior is cooperative.        Thought Content: Thought content normal.         Judgment: Judgment normal.      Diabetic foot exam: Normal inspection  No skin breakdown No calluses  Normal DP pulses Normal sensation to light touch and monofilament Nails : several thickened and yellow.. possible toenail infection.  Assessment and Plan    The patient's preventative maintenance and recommended screening tests for an annual wellness exam were reviewed in full today. Brought up to date unless services declined.  Counselled on the importance of diet, exercise, and its role in overall health and mortality. The patient's FH and SH was reviewed, including their home life, tobacco status, and drug and alcohol status.   Vaccines: Uptodate with flu,  and pneumovax, prevnar.  Get shingrix at pharmacy. Mammo: 3D Had 4/18nml, no family history of breast cancer... Wishes to continue after every 2 year. 05/2019 DVE: pap and DVE not indicated. TAH. Sister with ovarian cancer  DEXA:Normal, 4/2018repeat 5 year work on walking, Ca and Vit D Colon: 02/01/2005 Nml, Dr. Wynetta Emery, 12/2016 cologuard Nonsmoker   Diabetes mellitus, controlled Diet controlled.  Hyperlipidemia associated with type 2 diabetes mellitus ([place]) Well controlled. Continue current medication.  On simvastatin.  Hypertension associated with diabetes ([place]) Well controlled. Continue current medication. ON ACEI.  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  Mild cognitive impairment  Followed by neurology    [name], MD

## 2020-03-11 NOTE — Assessment & Plan Note (Signed)
Well controlled. Continue current medication.  On simvastatin.

## 2020-03-11 NOTE — Patient Instructions (Addendum)
Work on keeping up activity and occasional walking.  I do not recommend Prevagen.  Continue Namenda low dose if tolerated.  Continue aspirin baby dose.   Call if toenail begin to bother you.

## 2020-03-11 NOTE — Assessment & Plan Note (Signed)
Well controlled. Continue current medication. ON ACEI.

## 2020-03-23 ENCOUNTER — Ambulatory Visit: Payer: Self-pay

## 2020-03-23 ENCOUNTER — Other Ambulatory Visit: Payer: Self-pay

## 2020-03-23 ENCOUNTER — Ambulatory Visit: Payer: PPO

## 2020-03-23 DIAGNOSIS — E785 Hyperlipidemia, unspecified: Secondary | ICD-10-CM

## 2020-03-23 DIAGNOSIS — G3184 Mild cognitive impairment, so stated: Secondary | ICD-10-CM

## 2020-03-23 DIAGNOSIS — E1169 Type 2 diabetes mellitus with other specified complication: Secondary | ICD-10-CM

## 2020-03-23 NOTE — Chronic Care Management (AMB) (Signed)
Chronic Care Management Pharmacy  Name: Janet Chapman  MRN: [id number] DOB: 1935-11-29  Chief Complaint/ HPI  Janet Chapman,  84 y.o., female presents for their Follow-Up CCM visit with the clinical pharmacist via telephone.  PCP : [name], MD  Their chronic conditions include: HTN, DM, CAD, allergic rhinitis, GERD, chronic pain, OA, mild cognitive impairment  Patient concerns: allergies have worsened  Last CCM visit 12/26/19   Office Visits:  03/11/20: [name] AWV - work on keeping up activity, do not recommend Prevagen, continue Namenda low dose if tolerate, continue aspirin 81 mg   12/16/19: Tower, foot rash - no improvement with topical steroid. Try ketoconazole 2% for features of tinea pedis on left foot. Keep feet clean and dry. Consider dermatology referral if no improvement.   12/09/19: Bedsole, foot rash - start zyrtec at bedtime until itching improved. Apply topical steroid twice daily to left foot.   1/12/21[name], memory loss, dizziness, fatigue - possilbly medication induced, noticed ever since separating HCTZ from combo to two separate drugs, CBD oil for pain, allergies well controlled, itchy scalp, referral to neurology for memory  Consult Visit:  01/21/20: [name] NP Neurology - start Namenda very low-dose 5 mg daily, if tolerating well after 4 to 6 weeks with no adverse effect will increase dosing  12/22/19: [name] NP Neurology - stop exelon patch due to side effect. May consider addition of Namenda in a few weeks.   12/01/19:  Dr. [name] Neurology -Patient did not tolerate increased dose of Exelon to 3 mg bid due to nausea. May consider increasing to 1.5 mg tid in the future.   11/04/19:  Dr. [name] Cardiology - Slow heart rate detected on monitor - discontinue diltiazem. Monitor bp 2-3 times per week 2-3 hours after medications. May need to add amlodipine to help with blood pressure if increases above 140/80 mmHg.   08/29/19: Dr. [name] Cardiology - goal  BP 140/80 mmHg. Order 30 day monitor. Quadrigeminy noted during exam - need to exclude ventricular ectopy.   08/25/19: [name] Allergy - restart fluticasone nasal spray for sinus drainage. May use OTC cetirizine, loratadine, fexofenadine or levoceterizine daily as needed. Continue montelukast and azelastine.   06/29/19: [name], neurology - CT of head normal for age, trial Exelon patch  04/23/19: [name], Allergy - post nasal drip doing much better with azelastine BID, stopped flonase, Singulair daily, stopped antihistamine, still some sneezing and congestion (allergic to molds, dog, dust mites) - continue current medications  04/03/19: [name], cardiology - no medication changes  Allergies  Allergen Reactions  . Codeine Nausea Only   Medications: Outpatient Encounter Medications as of 03/23/2020  Medication Sig  . aspirin 81 MG tablet Take 81 mg by mouth at bedtime.   . Azelastine HCl 0.15 % SOLN May use 1-2 sprays per nostril up to twice a day as needed for sinus drainage.  Marland Kitchen losartan-hydrochlorothiazide (HYZAAR) 100-25 MG tablet Take 1 tablet by mouth daily.  . metoprolol succinate (TOPROL-XL) 50 MG 24 hr tablet Take 50 mg by mouth daily. Take with or immediately following a meal.  . montelukast (SINGULAIR) 10 MG tablet TAKE 1 TABLET BY MOUTH AT BEDTIME  . simvastatin (ZOCOR) 40 MG tablet TAKE 1 TABLET BY MOUTH DAILY AT BEDTIME  . Apoaequorin (PREVAGEN PO) Take 1 tablet by mouth in the morning and at bedtime. (Patient not taking: Reported on 03/11/2020)  . fluticasone (FLONASE) 50 MCG/ACT nasal spray Place 1 spray into both nostrils daily as  needed for allergies or rhinitis.  Marland Kitchen ketoconazole (NIZORAL) 2 % shampoo Apply 1 application topically 2 (two) times a week.  . rivastigmine (EXELON) 1.5 MG capsule Take 1.5 mg by mouth daily. (Patient not taking: Reported on 03/23/2020)  . triamcinolone cream (KENALOG) 0.5 % Apply 1 application topically 2 (two) times daily as needed.    No facility-administered encounter medications on file as of 03/23/2020.   Current Diagnosis/Assessment: Goals    .  Patient Stated (pt-stated)      Starting 11/27/2016, I will continue reading daily to keep my brain sharp.    .  Patient Stated      Starting 12/04/2017, I will continue to take medications as prescribed.     .  Pharmacy Care Plan      Current Barriers:  . Chronic Disease Management support, education, and care coordination needs related to HLD, mild cognitive impairment  Pharmacist Clinical Goal(s):  Marland Kitchen Cholesterol: Achieve cholesterol goal of LDL < 70 mg/dL. Increase daily exercise with goal of 30 minutes, 5 days a week. Continue to eat a heart heathy diet high in lean meats and vegetables. . Memory: Prevent memory decline - Continue to eat healthy diet high in nutrients such as the DASH or mediterranean diet for preventing memory loss.  . Remain up to date of vaccinations. Recommend Tetanus booster and Shingles vaccine.  Interventions: . Comprehensive medication review performed. . Refer to PCP for allergy concerns  Patient Self Care Activities:  . Continue to take medications as prescribed. . Continue to check blood pressure monthly.   Please see past updates related to this goal by clicking on the "Past Updates" button in the selected goal        Hypertension   Office blood pressures are  BP Readings from Last 3 Encounters:  03/11/20 120/70  01/21/20 (!) 143/87  12/22/19 (!) 149/70   CMP Latest Ref Rng & Units 03/04/2020 12/18/2018 09/04/2018  Glucose 70 - 99 mg/dL 116(H) 102(H) 125(H)  BUN 6 - 23 mg/dL 18 20 20   Creatinine 0.40 - 1.20 mg/dL 0.79 0.85 0.87  Sodium 135 - 145 mEq/L 138 141 138  Potassium 3.5 - 5.1 mEq/L 3.6 3.8 3.7  Chloride 96 - 112 mEq/L 95(L) 96 93(L)  CO2 19 - 32 mEq/L 37(H) 36(H) 26  Calcium 8.4 - 10.5 mg/dL 9.8 10.4 10.4(H)  Total Protein 6.0 - 8.3 g/dL 6.7 7.0 -  Total Bilirubin 0.2 - 1.2 mg/dL 0.8 0.7 -  Alkaline Phos 39 - 117  U/L 62 62 -  AST 0 - 37 U/L 15 15 -  ALT 0 - 35 U/L 11 12 -   BP goal < 140/90 mmHg Patient has failed these meds in the past: none  Patient checks BP at home: once monthly Patient home BP readings are ranging:  134/86, 112/64, 124/86, 123/64   Patient is currently controlled on the following medications:  Metoprolol succinate 50 mg - 1 tablet daily  Losartan-HCTZ 100-25 mg- 1 tablet daily  We discussed: drug store separated HCTZ and losartan for a little while due to supply, thought it caused some dizziness, but has since attributed the dizziness to CBD supplement   Update 03/23/20: < 5 day gap between refills  Denies BP concerns, not checking daily, when she does check BP it is good   Plan: Continue current medications   Hyperlipidemia/CAD   Lipid Panel     Component Value Date/Time   CHOL 182 03/04/2020 1542   TRIG 66.0 03/04/2020 1542  HDL 75.90 03/04/2020 1542   CHOLHDL 2 03/04/2020 1542   VLDL 13.2 03/04/2020 1542   LDLCALC 92 03/04/2020 1542   LDLDIRECT 119.8 04/26/2009 0846   LDL goal < 70 Patient has failed these meds in past: none reported Patient is currently uncontrolled on the following medications:  Simvastatin 40 mg - once daily at bedtime  Aspirin 81 mg - once daily  Update 03/23/20: < 5 day gap between refills; Ptt maintains healthy diet. Due to age, acceptable for LDL above 70 but < 100.   Plan: Continue current medications   Mild Cognitive Impairment   Symptoms: forgetful of things like names, directions, will come to her later Patient has failed these meds in past: donepezil, galantamine, rivastigmine patch Patient is currently controlled on the following medications:   Namenda 5 mg - 1 tablet BID    Update 03/23/20: Reports itching with higher doses of Namenda, states she is doing well on 5 mg BID.   Plan: Continue current medications  Allergic Rhinitis   Allergens: mold, dogs, dust mites Patient has failed these meds in past: flonase,  saline spray Patient is currently uncontrolled on the following medications:  Montelukast 10 mg - 1 tablet at bedtime  Azelastine nasal solution 0.15% - 1-2 sprays per nostril twice daily   We discussed: Reports nasal congestion and mucous in throat twice a day regardless of medication, montelukast does not seem to help. Worsens after eating or drinking.  Plan: Refer to PCP for evaluation  Medication Management  OTCs:  Multivitamin with D3 daily  Pharmacy: [facility]; [facility]   Adherence: No concerns, does not use pill box but has list of medications with timing  Affordability:  No concerns  Vaccines: COVID vaccine, influenza, PCV13, PPSV23 - up to date; due for Td, Shingrix   CCM Follow Up: declines CCM follow up  Debbora Dus, PharmD Clinical Pharmacist [facility] at [facility] [phone]

## 2020-03-23 NOTE — Patient Instructions (Signed)
Dear Janet Chapman,  Below is a summary of the goals we discussed during our follow up appointment on March 23, 2020. Please contact me anytime with questions or concerns.   Visit Information  Goals Addressed            This Visit's Progress    Pharmacy Care Plan       Current Barriers:   Chronic Disease Management support, education, and care coordination needs related to HLD, mild cognitive impairment  Pharmacist Clinical Goal(s):   Cholesterol: Achieve cholesterol goal of LDL < 70 mg/dL. Increase daily exercise with goal of 30 minutes, 5 days a week. Continue to eat a heart heathy diet high in lean meats and vegetables.  Memory: Prevent memory decline - Continue to eat healthy diet high in nutrients such as the DASH or mediterranean diet for preventing memory loss.   Remain up to date of vaccinations. Recommend Tetanus booster and Shingles vaccine.  Interventions:  Comprehensive medication review performed.  Refer to PCP for allergy concerns  Patient Self Care Activities:   Continue to take medications as prescribed.  Continue to check blood pressure monthly.   Please see past updates related to this goal by clicking on the "Past Updates" button in the selected goal         Patient verbalizes understanding of instructions provided today.   Debbora Dus, PharmD Clinical Pharmacist [facility] at [facility] [phone]    Basics of Medicine Management Taking your medicines correctly is an important part of managing or preventing medical problems. Make sure you know what disease or condition your medicine is treating, and how and when to take it. If you do not take your medicine correctly, it may not work well and may cause unpleasant side effects, including serious health problems. What should I do when I am taking medicines?   Read all the labels and inserts that come with your medicines. Review the information often.  Talk with your  pharmacist if you get a refill and notice a change in the size, color, or shape of your medicines.  Know the potential side effects for each medicine that you take.  Try to get all your medicines from the same pharmacy. The pharmacist will have all your information and will understand how your medicines will affect each other (interact).  Tell your health care provider about all your medicines, including over-the-counter medicines, vitamins, and herbal or dietary supplements. He or she will make sure that nothing will interact with any of your prescribed medicines. How can I take my medicines safely?  Take medicines only as told by your health care provider. ? Do not take more of your medicine than instructed. ? Do not take anyone else's medicines. ? Do not share your medicines with others. ? Do not stop taking your medicines unless your health care provider tells you to do so. ? You may need to avoid alcohol or certain foods or liquids when taking certain medicines. Follow your health care provider's instructions.  Do not split, mash, or chew your medicines unless your health care provider tells you to do so. Tell your health care provider if you have trouble swallowing your medicines.  For liquid medicine, use the dosing container that was provided. How should I organize my medicines?  Know your medicines  Know what each of your medicines looks like. This includes size, color, and shape. Tell your health care provider if you are having trouble recognizing all the medicines that you  are taking.  If you cannot tell your medicines apart because they look similar, keep them in original bottles.  If you cannot read the labels on the bottles, tell your pharmacist to put your medicines in containers with large print.  Review your medicines and your schedule with family members, a friend, or a caregiver. Use a pill organizer  Use a tool to organize your medicine schedule. Tools include a  weekly pillbox, a written chart, a notebook, or a calendar.  Your tool should help you remember the following things about each medicine: ? The name of the medicine. ? The amount (dose) to take. ? The schedule. This is the day and time the medicine should be taken. ? The appearance. This includes color, shape, size, and stamp. ? How to take your medicines. This includes instructions to take them with food, without food, with fluids, or with other medicines.  Create reminders for taking your medicines. Use sticky notes, or alarms on your watch, mobile device, or phone calendar.  You may choose to use a more advanced management system. These systems have storage, alarms, and visual and audio prompts.  Some medicines can be taken on an "as-needed" basis. These include medicines for nausea or pain. If you take an as-needed medicine, write down the name and dose, as well as the date and time that you took it. How should I plan for travel?  Take your pillbox, medicines, and organization system with you when traveling.  Have your medicines refilled before you travel. This will ensure that you do not run out of your medicines while you are away from home.  Always carry an updated list of your medicines with you. If there is an emergency, a first responder can quickly see what medicines you are taking.  Do not pack your medicines in checked luggage in case your luggage is lost or delayed.  If any of your medicines is considered a controlled substance, make sure you bring a letter from your health care provider with you. How should I store and discard my medicines? For safe storage:  Store medicines in a cool, dry area away from light, or as directed by your health care provider. Do not store medicines in the bathroom. Heat and humidity will affect them.  Do not store your medicines with other chemicals, or with medicines for pets or other household members.  Keep medicines away from children  and pets. Do not leave them on counters or bedside tables. Store them in high cabinets or on high shelves. For safe disposal:  Check expiration dates regularly. Do not take expired medicines. Discard medicines that are older than the expiration date.  Learn a safe way to dispose of your medicines. You may: ? Use a local government, hospital, or pharmacy medicine-take-back program. ? Mix the medicines with inedible substances, put them in a sealed bag or empty container, and throw them in the trash. What should I remember?  Tell your health care provider if you: ? Experience side effects. ? Have new symptoms. ? Have other concerns about taking your medicines.  Review your medicines regularly with your health care provider. Other medicines, diet, medical conditions, weight changes, and daily habits can all affect how medicines work. Ask if you need to continue taking each medicine, and discuss how well each one is working.  Refill your medicines early to avoid running out of them.  In case of an accidental overdose, call your local [place] at [phone] or visit  your local emergency department immediately. This is important. Summary  Taking your medicines correctly is an important part of managing or preventing medical problems.  You need to make sure that you understand what you are taking a medicine for, as well as how and when you need to take it.  Know your medicines and use a pill organizer to help you take your medicines correctly.  In case of an accidental overdose, call your local [place] at [phone] or visit your local emergency department immediately. This is important. This information is not intended to replace advice given to you by your health care provider. Make sure you discuss any questions you have with your health care provider. Document Revised: 04/26/2017 Document Reviewed: 04/26/2017 Elsevier Patient Education  2020 [employer].

## 2020-03-23 NOTE — Chronic Care Management (AMB) (Signed)
Patient reports nasal congestion and mucous in throat twice daily after meals. On montelukast, flonase, and azelastine, denies effectiveness. Denies any medications for acid reflux. She would like to know if there is a medication she could try.   Thanks,  Debbora Dus, PharmD Clinical Pharmacist [facility] at [facility] [phone]

## 2020-03-25 ENCOUNTER — Other Ambulatory Visit: Payer: Self-pay

## 2020-03-25 ENCOUNTER — Ambulatory Visit (INDEPENDENT_AMBULATORY_CARE_PROVIDER_SITE_OTHER): Payer: PPO | Admitting: Internal Medicine

## 2020-03-25 ENCOUNTER — Encounter: Payer: Self-pay | Admitting: Internal Medicine

## 2020-03-25 VITALS — BP 126/84 | HR 60 | Temp 96.9°F | Wt 137.0 lb

## 2020-03-25 DIAGNOSIS — R040 Epistaxis: Secondary | ICD-10-CM

## 2020-03-25 LAB — CBC
HCT: 41.1 % (ref 36.0–46.0)
Hemoglobin: 13.9 g/dL (ref 12.0–15.0)
MCHC: 33.9 g/dL (ref 30.0–36.0)
MCV: 93.9 fl (ref 78.0–100.0)
Platelets: 231 10*3/uL (ref 150.0–400.0)
RBC: 4.38 Mil/uL (ref 3.87–5.11)
RDW: 14.1 % (ref 11.5–15.5)
WBC: 5.5 10*3/uL (ref 4.0–10.5)

## 2020-03-25 NOTE — Progress Notes (Signed)
Subjective:    Patient ID: Janet Chapman, female    DOB: 1935-05-30, 84 y.o.   MRN: [id number]  HPI  Pt presents to the clinic today with c/o nosebleeds. She reports this occurred x  2 last night and 1 x a few weeks ago. She denies nasal congestion or runny nose. She has never had nosebleeds before. She only uses Flonase occasionally and never use Astelin. She does take a baby ASA daily.  Review of Systems      Past Medical History:  Diagnosis Date  . Allergic rhinitis   . Diabetes mellitus without complication (HCC)    diet controlled  . GERD (gastroesophageal reflux disease)   . Hyperlipidemia   . Hypertension   . Memory loss   . Osteoarthritis   . Urinary incontinence     Current Outpatient Medications  Medication Sig Dispense Refill  . aspirin 81 MG tablet Take 81 mg by mouth at bedtime.     . Azelastine HCl 0.15 % SOLN May use 1-2 sprays per nostril up to twice a day as needed for sinus drainage. 30 mL 5  . fluticasone (FLONASE) 50 MCG/ACT nasal spray Place 1 spray into both nostrils daily as needed for allergies or rhinitis.    Marland Kitchen losartan-hydrochlorothiazide (HYZAAR) 100-25 MG tablet Take 1 tablet by mouth daily. 90 tablet 1  . metoprolol succinate (TOPROL-XL) 50 MG 24 hr tablet Take 50 mg by mouth daily. Take with or immediately following a meal.    . montelukast (SINGULAIR) 10 MG tablet TAKE 1 TABLET BY MOUTH AT BEDTIME 30 tablet 2  . rivastigmine (EXELON) 1.5 MG capsule Take 1.5 mg by mouth daily.     . simvastatin (ZOCOR) 40 MG tablet TAKE 1 TABLET BY MOUTH DAILY AT BEDTIME 90 tablet 0   No current facility-administered medications for this visit.    Allergies  Allergen Reactions  . Codeine Nausea Only    Family History  Problem Relation Age of Onset  . Heart attack Father 78  . Dementia Mother   . Stroke Mother 55  . Ovarian cancer Sister 34  . Diabetes Other        Maternal side  . Ovarian cancer Other        Grandmother  . Ovarian cancer Other         aunt  . Colon cancer Neg Hx     Social History   Socioeconomic History  . Marital status: Married    Spouse name: Not on file  . Number of children: 0  . Years of education: Not on file  . Highest education level: Not on file  Occupational History  . Occupation: retired  Tobacco Use  . Smoking status: Former Research scientist (life sciences)  . Smokeless tobacco: Never Used  . Tobacco comment: In college  Vaping Use  . Vaping Use: Never used  Substance and Sexual Activity  . Alcohol use: Yes    Comment: 1 glass of wine per month  . Drug use: No  . Sexual activity: Never  Other Topics Concern  . Not on file  Social History Narrative    Regular exercise: yes, Curves 2x a week   Married 48 + years    Diet: fruit and veggies, no FF, some water, artificial sweetener   HCPOA: [name], has living will, full code ( reviewed 2014)            Social Determinants of Health   Financial Resource Strain:   . Difficulty  of Paying Living Expenses: Not on file  Food Insecurity:   . Worried About Charity fundraiser in the Last Year: Not on file  . Ran Out of Food in the Last Year: Not on file  Transportation Needs:   . Lack of Transportation (Medical): Not on file  . Lack of Transportation (Non-Medical): Not on file  Physical Activity:   . Days of Exercise per Week: Not on file  . Minutes of Exercise per Session: Not on file  Stress:   . Feeling of Stress : Not on file  Social Connections:   . Frequency of Communication with Friends and Family: Not on file  . Frequency of Social Gatherings with Friends and Family: Not on file  . Attends Religious Services: Not on file  . Active Member of Clubs or Organizations: Not on file  . Attends Archivist Meetings: Not on file  . Marital Status: Not on file  Intimate Partner Violence:   . Fear of Current or Ex-Partner: Not on file  . Emotionally Abused: Not on file  . Physically Abused: Not on file  . Sexually Abused: Not on file      Constitutional: Denies fever, malaise, fatigue, headache or abrupt weight changes.  HEENT: Pt reports nose bleeds. Denies eye pain, eye redness, ear pain, ringing in the ears, wax buildup, runny nose, nasal congestion, or sore throat. Respiratory: Denies difficulty breathing, shortness of breath, cough or sputum production.   Cardiovascular: Denies chest pain, chest tightness, palpitations or swelling in the hands or feet.   No other specific complaints in a complete review of systems (except as listed in HPI above).  Objective:   Physical Exam    BP 126/84   Pulse 60   Temp (!) 96.9 F (36.1 C) (Temporal)   Wt 137 lb (62.1 kg)   SpO2 97%   BMI 25.06 kg/m  Wt Readings from Last 3 Encounters:  03/25/20 137 lb (62.1 kg)  03/11/20 138 lb 12 oz (62.9 kg)  01/21/20 139 lb (63 kg)    General: Appears her sated age, well developed, well nourished in NAD. HEENT: Head: normal shape and size; Right Nare: mucosa boggy and moist, septum midline, no active bleeding or open lesions noted;  Cardiovascular: Normal rate and rhythm. Pulmonary/Chest: Normal effort. Neurological: Alert and oriented.    BMET    Component Value Date/Time   NA 138 03/04/2020 1542   NA 138 09/04/2018 1537   K 3.6 03/04/2020 1542   CL 95 (L) 03/04/2020 1542   CO2 37 (H) 03/04/2020 1542   GLUCOSE 116 (H) 03/04/2020 1542   BUN 18 03/04/2020 1542   BUN 20 09/04/2018 1537   CREATININE 0.79 03/04/2020 1542   CALCIUM 9.8 03/04/2020 1542   GFRNONAA 63 09/04/2018 1537   GFRAA 73 09/04/2018 1537    Lipid Panel     Component Value Date/Time   CHOL 182 03/04/2020 1542   TRIG 66.0 03/04/2020 1542   HDL 75.90 03/04/2020 1542   CHOLHDL 2 03/04/2020 1542   VLDL 13.2 03/04/2020 1542   LDLCALC 92 03/04/2020 1542    CBC    Component Value Date/Time   WBC 5.7 12/18/2018 0812   RBC 4.39 12/18/2018 0812   HGB 14.3 12/18/2018 0812   HGB 14.5 09/04/2018 1537   HCT 42.1 12/18/2018 0812   HCT 42.4  09/04/2018 1537   PLT 215.0 12/18/2018 0812   PLT 252 09/04/2018 1537   MCV 95.8 12/18/2018 0812   MCV  93 09/04/2018 1537   MCH 31.8 09/04/2018 1537   MCH 31.9 02/26/2010 1632   MCHC 33.9 12/18/2018 0812   RDW 14.0 12/18/2018 0812   RDW 13.0 09/04/2018 1537   LYMPHSABS 1.9 12/18/2018 0812   LYMPHSABS 1.8 09/04/2018 1537   MONOABS 0.7 12/18/2018 0812   EOSABS 0.1 12/18/2018 0812   EOSABS 0.1 09/04/2018 1537   BASOSABS 0.0 12/18/2018 0812   BASOSABS 0.0 09/04/2018 1537    Hgb A1C Lab Results  Component Value Date   HGBA1C 6.5 03/04/2020          Assessment & Plan:   Nosebleed:  No active bleeding at this time Avoid nasal sprays for now Encouraged her to get a cool mist humidifier and use in her home for the next few days Will check platelets- CBC ordered Continue baby ASA for now, can discuss use of this with your PCP  Will follow up after labs, return precautions discussed Webb Silversmith, NP This visit occurred during the SARS-CoV-2 public health emergency.  Safety protocols were in place, including screening questions prior to the visit, additional usage of staff PPE, and extensive cleaning of exam room while observing appropriate contact time as indicated for disinfecting solutions.

## 2020-03-25 NOTE — Patient Instructions (Signed)
Nosebleed, Adult A nosebleed is when blood comes out of the nose. Nosebleeds are common. Usually, they are not a sign of a serious condition. Nosebleeds can happen if a small blood vessel in your nose starts to bleed or if the lining of your nose (mucous membrane) cracks. They are commonly caused by:  Allergies.  Colds.  Picking your nose.  Blowing your nose too hard.  An injury from sticking an object into your nose or getting hit in the nose.  Dry or cold air. Less common causes of nosebleeds include:  Toxic fumes.  Something abnormal in the nose or in the air-filled spaces in the bones of the face (sinuses).  Growths in the nose, such as polyps.  Medicines or conditions that cause blood to clot slowly.  Certain illnesses or procedures that irritate or dry out the nasal passages. Follow these instructions at home: When you have a nosebleed:   Sit down and tilt your head slightly forward.  Use a clean towel or tissue to pinch your nostrils under the bony part of your nose. After 10 minutes, let go of your nose and see if bleeding starts again. Do not release pressure before that time. If there is still bleeding, repeat the pinching and holding for 10 minutes until the bleeding stops.  Do not place tissues or gauze in the nose to stop bleeding.  Avoid lying down and avoid tilting your head backward. That may make blood collect in the throat and cause gagging or coughing.  Use a nasal spray decongestant to help with a nosebleed as told by your health care provider.  Do not use petroleum jelly or mineral oil in your nose. It can drip into your lungs. After a nosebleed:  Avoid blowing your nose or sniffing for a number of hours.  Avoid straining, lifting, or bending at the waist for several days. You may resume other normal activities as you are able.  Use saline spray or a humidifier as told by your health care provider.  Aspirinand blood thinners make bleeding more  likely. If you are prescribed these medicines and you suffer from nosebleeds: ? Ask your health care provider if you should stop taking the medicines or if you should adjust the dose. ? Do not stop taking medicines that your health care provider has recommended unless told by your health care provider.  If your nosebleed was caused by dry mucous membranes, use over-the-counter saline nasal spray or gel. This will keep the mucous membranes moist and allow them to heal. If you must use a lubricant: ? Choose one that is water-soluble. ? Use only as much as you need and use it only as often as needed. ? Do not lie down until several hours after you use it. Contact a health care provider if:  You have a fever.  You get nosebleeds often or more often than usual.  You bruise very easily.  You have a nosebleed from having something stuck in your nose.  You have bleeding in your mouth.  You vomit or cough up brown material.  You have a nosebleed after you start a new medicine. Get help right away if:  You have a nosebleed after a fall or a head injury.  Your nosebleed does not go away after 20 minutes.  You feel dizzy or weak.  You have unusual bleeding from other parts of your body.  You have unusual bruising on other parts of your body.  You become sweaty.  You   vomit blood. This information is not intended to replace advice given to you by your health care provider. Make sure you discuss any questions you have with your health care provider. Document Revised: 07/31/2017 Document Reviewed: 11/16/2015 Elsevier Patient Education  2020 Elsevier Inc.  

## 2020-04-19 ENCOUNTER — Telehealth: Payer: Self-pay | Admitting: Family Medicine

## 2020-04-19 NOTE — Telephone Encounter (Signed)
Rose,  That was and error... there is no other condition. It should be Dx E11.9 ( The other DM diagnosis on her problem list and charged at 03/11/20 OV)... but I cannot seem to change the dx on my end now that labs are associated and drawn. Shalini Mair

## 2020-04-19 NOTE — Telephone Encounter (Signed)
-----   Message from America Brown sent at 03/08/2020  5:35 AM EDT ----- Regarding: E08.8-Diabetes mellitus due to underlying condition with unspecified complications Hi Dr. [name],   One of the dx associated with this patient's  lab visit for DOS 03/04/2020 is E08.8 (Diabetes mellitus due to underlying condition with unspecified complications). This dx requires the condition causing the DM to also be coded. Would you like to add? Or if DM not due to underlying condition, could you please correct and let me know what you would like to use?   Thanks for your help,  Kalman Shan

## 2020-04-26 ENCOUNTER — Telehealth: Payer: Self-pay | Admitting: Family Medicine

## 2020-04-26 ENCOUNTER — Ambulatory Visit: Payer: PPO

## 2020-04-26 NOTE — Telephone Encounter (Signed)
Patient came into office and stated she is requesting a different allergy medication, as they one she is currently using is no longer working. Please advise if an alternative is able to be sent in.

## 2020-04-27 ENCOUNTER — Other Ambulatory Visit: Payer: Self-pay | Admitting: Family Medicine

## 2020-04-27 NOTE — Telephone Encounter (Signed)
Continue Singulair but add Xyzal OTC at bedtime as well as flonase 2 sprays per nostril daily

## 2020-04-27 NOTE — Telephone Encounter (Signed)
Spoke with pt to let her know of the medications that Dr. [name] would like for her to add to the singulair. Pt gave a verbal understanding.

## 2020-05-05 ENCOUNTER — Ambulatory Visit: Payer: PPO

## 2020-05-17 ENCOUNTER — Other Ambulatory Visit: Payer: Self-pay | Admitting: Family Medicine

## 2020-05-26 ENCOUNTER — Other Ambulatory Visit: Payer: Self-pay | Admitting: Family Medicine

## 2020-06-24 ENCOUNTER — Ambulatory Visit: Payer: PPO | Admitting: Neurology

## 2020-06-24 ENCOUNTER — Encounter: Payer: Self-pay | Admitting: Neurology

## 2020-06-24 VITALS — BP 152/77 | HR 74 | Ht 62.0 in | Wt 140.0 lb

## 2020-06-24 DIAGNOSIS — G3184 Mild cognitive impairment, so stated: Secondary | ICD-10-CM | POA: Diagnosis not present

## 2020-06-24 MED ORDER — MEMANTINE HCL 10 MG PO TABS: 10.0000 mg | ORAL_TABLET | Freq: Two times a day (BID) | ORAL | 1 refills | Status: DC

## 2020-06-24 NOTE — Progress Notes (Signed)
PATIENT: Janet Chapman DOB: [birth year]  REASON FOR VISIT: follow up HISTORY FROM: patient  HISTORY OF PRESENT ILLNESS: Today 06/24/20 Ms. Iacovelli is an 85 year old female with history of memory problems. Could not tolerate Exelon patch due to blisters, the capsules caused rash. MMSE 23/30 today. Most trouble with short term memory, has to write things down. She drives, does the grocery shopping. Keeps the household. Currently on Namenda 5/10 mg daily, is going to increase up to 10 mg twice daily, going slowly due to dizziness with each step up. She will repeat herself, say the same things to her husband. They have no children. Family is spread out in [place]. She enjoys house cleaning, watching TV, shopping at [facility], going back to church. Here with husband. She takes Prevagen from time to time, is on it now.  HISTORY 01/21/2020 SS: Ms. Pumphrey is an 85 year old female with history of memory problems, CT head was normal for age.  When last seen, she developed a rash to her feet, we stopped Exelon capsules, the rash cleared.  It is almost completely gone.  With Exelon patches, she had blisters.  Is here requesting to start another medication for memory.  Her husband notices a difference in her memory and her mood on medication.  She is calmer and has a better attitude.  She takes Prevagen from time to time.  Presents today for evaluation accompanied by her husband.   REVIEW OF SYSTEMS: Out of a complete 14 system review of symptoms, the patient complains only of the following symptoms, and all other reviewed systems are negative.  Memory loss  ALLERGIES: Allergies  Allergen Reactions  . Codeine Nausea Only    HOME MEDICATIONS: Outpatient Medications Prior to Visit  Medication Sig Dispense Refill  . aspirin 81 MG tablet Take 81 mg by mouth at bedtime.    . Azelastine HCl 0.15 % SOLN May use 1-2 sprays per nostril up to twice a day as needed for sinus drainage. 30 mL 5  . fluticasone  (FLONASE) 50 MCG/ACT nasal spray Place 1 spray into both nostrils daily as needed for allergies or rhinitis.    Marland Kitchen losartan-hydrochlorothiazide (HYZAAR) 100-25 MG tablet Take 1 tablet by mouth daily. 90 tablet 1  . memantine (NAMENDA) 5 MG tablet 5 mg in am 10 mg p    . metoprolol succinate (TOPROL-XL) 50 MG 24 hr tablet TAKE 1 TABLET BY MOUTH DAILY 90 tablet 1  . montelukast (SINGULAIR) 10 MG tablet TAKE 1 TABLET BY MOUTH AT BEDTIME 30 tablet 2  . rivastigmine (EXELON) 1.5 MG capsule Take 1.5 mg by mouth daily.     . simvastatin (ZOCOR) 40 MG tablet TAKE 1 TABLET BY MOUTH DAILY AT BEDTIME 90 tablet 3   No facility-administered medications prior to visit.    PAST MEDICAL HISTORY: Past Medical History:  Diagnosis Date  . Allergic rhinitis   . Diabetes mellitus without complication (HCC)    diet controlled  . GERD (gastroesophageal reflux disease)   . Hyperlipidemia   . Hypertension   . Memory loss   . Osteoarthritis   . Urinary incontinence     PAST SURGICAL HISTORY: Past Surgical History:  Procedure Laterality Date  . ABDOMINAL HYSTERECTOMY  1993   TA  . CARDIOVASCULAR STRESS TEST  2004   H. Smith, cardiolite-normal  . CHOLECYSTECTOMY  [phone]  . COLONOSCOPY    . LEFT HEART CATH AND CORONARY ANGIOGRAPHY N/A 11/06/2017   Procedure: LEFT HEART CATH AND CORONARY ANGIOGRAPHY;  Surgeon: Burnell Blanks, MD;  Location: [facility];  Service: Cardiovascular;  Laterality: N/A;    FAMILY HISTORY: Family History  Problem Relation Age of Onset  . Heart attack Father 51  . Dementia Mother   . Stroke Mother 97  . Ovarian cancer Sister 46  . Diabetes Other        Maternal side  . Ovarian cancer Other        Grandmother  . Ovarian cancer Other        aunt  . Colon cancer Neg Hx     SOCIAL HISTORY: Social History   Socioeconomic History  . Marital status: Married    Spouse name: Not on file  . Number of children: 0  . Years of education: Not on file  .  Highest education level: Not on file  Occupational History  . Occupation: retired  Tobacco Use  . Smoking status: Former Research scientist (life sciences)  . Smokeless tobacco: Never Used  . Tobacco comment: In college  Vaping Use  . Vaping Use: Never used  Substance and Sexual Activity  . Alcohol use: Yes    Comment: 1 glass of wine per month  . Drug use: No  . Sexual activity: Never  Other Topics Concern  . Not on file  Social History Narrative    Regular exercise: yes, Curves 2x a week   Married 48 + years    Diet: fruit and veggies, no FF, some water, artificial sweetener   HCPOA: [name], has living will, full code ( reviewed 2014)            Social Determinants of Health   Financial Resource Strain: Not on file  Food Insecurity: Not on file  Transportation Needs: Not on file  Physical Activity: Not on file  Stress: Not on file  Social Connections: Not on file  Intimate Partner Violence: Not on file   PHYSICAL EXAM  Vitals:   06/24/20 1005  BP: (!) 152/77  Pulse: 74  Weight: 140 lb (63.5 kg)  Height: 5\' 2"  (1.575 m)   Body mass index is 25.61 kg/m.  Generalized: Well developed, in no acute distress  MMSE - Mini Mental State Exam 06/24/2020 12/22/2019 06/18/2019  Orientation to time 4 5 5   Orientation to Place 5 5 5   Registration 3 3 3   Attention/ Calculation 1 5 3   Recall 1 1 2   Language- name 2 objects 2 2 2   Language- repeat 1 1 1   Language- follow 3 step command 3 3 3   Language- read & follow direction 1 1 1   Write a sentence 1 1 1   Copy design 1 1 1   Total score 23 28 27     Neurological examination  Mentation: Alert oriented to time, place, history taking. Follows all commands speech and language fluent Cranial nerve II-XII: Pupils were equal round reactive to light. Extraocular movements were full, visual field were full on confrontational test. Facial sensation and strength were normal. Head turning and shoulder shrug  were normal and symmetric. Motor: The motor  testing reveals 5 over 5 strength of all 4 extremities. Good symmetric motor tone is noted throughout.  Sensory: Sensory testing is intact to soft touch on all 4 extremities. No evidence of extinction is noted.  Coordination: Cerebellar testing reveals good finger-nose-finger and heel-to-shin bilaterally.  Gait and station: Gait is normal, steady, independent Reflexes: Deep tendon reflexes are symmetric and normal bilaterally.   DIAGNOSTIC DATA (LABS, IMAGING, TESTING) - I reviewed patient  records, labs, notes, testing and imaging myself where available.  Lab Results  Component Value Date   WBC 5.5 03/25/2020   HGB 13.9 03/25/2020   HCT 41.1 03/25/2020   MCV 93.9 03/25/2020   PLT 231.0 03/25/2020      Component Value Date/Time   NA 138 03/04/2020 1542   NA 138 09/04/2018 1537   K 3.6 03/04/2020 1542   CL 95 (L) 03/04/2020 1542   CO2 37 (H) 03/04/2020 1542   GLUCOSE 116 (H) 03/04/2020 1542   BUN 18 03/04/2020 1542   BUN 20 09/04/2018 1537   CREATININE 0.79 03/04/2020 1542   CALCIUM 9.8 03/04/2020 1542   PROT 6.7 03/04/2020 1542   ALBUMIN 4.6 03/04/2020 1542   AST 15 03/04/2020 1542   ALT 11 03/04/2020 1542   ALKPHOS 62 03/04/2020 1542   BILITOT 0.8 03/04/2020 1542   GFRNONAA 63 09/04/2018 1537   GFRAA 73 09/04/2018 1537   Lab Results  Component Value Date   CHOL 182 03/04/2020   HDL 75.90 03/04/2020   LDLCALC 92 03/04/2020   LDLDIRECT 119.8 04/26/2009   TRIG 66.0 03/04/2020   CHOLHDL 2 03/04/2020   Lab Results  Component Value Date   HGBA1C 6.5 03/04/2020   Lab Results  Component Value Date   VITAMINB12 461 06/18/2019   Lab Results  Component Value Date   TSH 3.92 12/18/2018   ASSESSMENT AND PLAN 85 y.o. year old female  has a past medical history of Allergic rhinitis, Diabetes mellitus without complication ([place]), GERD (gastroesophageal reflux disease), Hyperlipidemia, Hypertension, Memory loss, Osteoarthritis, and Urinary incontinence. here with:  1.   Memory disturbance  Some decline in memory score, 23/30 today, was 28/30 in August.  She remains highly functional, is well-appearing, good historian.  She will continue her dose increase to full dose Namenda 10 mg twice a day.  She has been quite sensitive to medication.  Exelon patch caused blisters, Exelon capsules caused rash.  I do not think she has tried Aricept, but they are not sure.  I think we could consider a low-dose trial of Aricept added on to regimen in the future.  She will follow-up in 6 months or sooner if needed.  I spent 30 minutes of face-to-face and non-face-to-face time with patient.  This included previsit chart review, lab review, study review, order entry, electronic health record documentation, patient education.  Butler Denmark, AGNP-C, DNP 06/24/2020, 10:18 AM [facility] [address], [place], [place] [address] [phone]

## 2020-06-24 NOTE — Patient Instructions (Signed)
Continue to try to increase the Namenda to 10 mg twice daily See you back in 6 months

## 2020-06-25 NOTE — Progress Notes (Signed)
I have read the note, and I agree with the clinical assessment and plan.  Seline Enzor K Ivah Girardot   

## 2020-06-28 ENCOUNTER — Telehealth: Payer: Self-pay | Admitting: Family Medicine

## 2020-06-28 DIAGNOSIS — E0821 Diabetes mellitus due to underlying condition with diabetic nephropathy: Secondary | ICD-10-CM

## 2020-06-28 NOTE — Telephone Encounter (Signed)
-----   Message from Cloyd Stagers, RT sent at 06/21/2020  2:06 PM EST ----- Regarding: Lab Orders for Thursday 2.24.2022 Please place lab orders for Thursday 2.24.2022, office visit for 6 month f/u on Thursday 3.3.2022 Thank you, Dyke Maes RT(R)

## 2020-07-08 ENCOUNTER — Other Ambulatory Visit: Payer: Self-pay

## 2020-07-08 ENCOUNTER — Other Ambulatory Visit (INDEPENDENT_AMBULATORY_CARE_PROVIDER_SITE_OTHER): Payer: PPO

## 2020-07-08 DIAGNOSIS — E0821 Diabetes mellitus due to underlying condition with diabetic nephropathy: Secondary | ICD-10-CM

## 2020-07-08 DIAGNOSIS — E1121 Type 2 diabetes mellitus with diabetic nephropathy: Secondary | ICD-10-CM | POA: Diagnosis not present

## 2020-07-08 LAB — COMPREHENSIVE METABOLIC PANEL
ALT: 9 U/L (ref 0–35)
AST: 13 U/L (ref 0–37)
Albumin: 4.6 g/dL (ref 3.5–5.2)
Alkaline Phosphatase: 66 U/L (ref 39–117)
BUN: 19 mg/dL (ref 6–23)
CO2: 37 mEq/L — ABNORMAL HIGH (ref 19–32)
Calcium: 10.2 mg/dL (ref 8.4–10.5)
Chloride: 95 mEq/L — ABNORMAL LOW (ref 96–112)
Creatinine, Ser: 0.8 mg/dL (ref 0.40–1.20)
GFR: 68.56 mL/min (ref >60.00)
Glucose, Bld: 83 mg/dL (ref 70–99)
Potassium: 3.8 mEq/L (ref 3.5–5.1)
Sodium: 139 mEq/L (ref 135–145)
Total Bilirubin: 0.8 mg/dL (ref 0.2–1.2)
Total Protein: 7.1 g/dL (ref 6.0–8.3)

## 2020-07-08 LAB — LIPID PANEL
Cholesterol: 177 mg/dL (ref 0–200)
HDL: 78.9 mg/dL (ref >39.00)
LDL Cholesterol: 80 mg/dL (ref 0–99)
NonHDL: 98.41
Total CHOL/HDL Ratio: 2
Triglycerides: 91 mg/dL (ref 0.0–149.0)
VLDL: 18.2 mg/dL (ref 0.0–40.0)

## 2020-07-08 LAB — HEMOGLOBIN A1C: Hgb A1c MFr Bld: 6.2 % (ref 4.6–6.5)

## 2020-07-08 NOTE — Progress Notes (Signed)
No critical labs need to be addressed urgently. We will discuss labs in detail at upcoming office visit.   

## 2020-07-15 ENCOUNTER — Ambulatory Visit (INDEPENDENT_AMBULATORY_CARE_PROVIDER_SITE_OTHER): Payer: PPO | Admitting: Family Medicine

## 2020-07-15 ENCOUNTER — Other Ambulatory Visit: Payer: Self-pay

## 2020-07-15 ENCOUNTER — Encounter: Payer: Self-pay | Admitting: Family Medicine

## 2020-07-15 VITALS — BP 150/82 | HR 53 | Temp 98.1°F | Ht 62.0 in | Wt 142.2 lb

## 2020-07-15 DIAGNOSIS — E1169 Type 2 diabetes mellitus with other specified complication: Secondary | ICD-10-CM

## 2020-07-15 DIAGNOSIS — E785 Hyperlipidemia, unspecified: Secondary | ICD-10-CM | POA: Diagnosis not present

## 2020-07-15 DIAGNOSIS — I152 Hypertension secondary to endocrine disorders: Secondary | ICD-10-CM | POA: Diagnosis not present

## 2020-07-15 DIAGNOSIS — E1159 Type 2 diabetes mellitus with other circulatory complications: Secondary | ICD-10-CM

## 2020-07-15 NOTE — Patient Instructions (Signed)
Follow blood pressure at home x 2 weeks bring in measurement.Marland Kitchen goal < 150/90.

## 2020-07-15 NOTE — Progress Notes (Signed)
Patient ID: Janet Chapman, female    DOB: [birth year], 85 y.o.   MRN: [id number]  This visit was conducted in person.  BP (!) 150/82   Pulse (!) 53   Temp 98.1 F (36.7 C) (Temporal)   Ht 5\' 2"  (1.575 m)   Wt 142 lb 4 oz (64.5 kg)   SpO2 96%   BMI 26.02 kg/m    CC:  Chief Complaint  Patient presents with  . Diabetes    Subjective:   HPI: Janet Chapman is a 85 y.o. female presenting on 07/15/2020 for Diabetes   Dementia: on Namenda 10 mg twice daily.  Diabetes:   Diet controlled.  Lab Results  Component Value Date   HGBA1C 6.2 07/08/2020  Using medications without difficulties: Hypoglycemic episodes:none Hyperglycemic episodes:none Feet problems: none Blood Sugars averaging: not chekcing eye exam within last year:  Elevated Cholesterol:  LDL at goal on  Simvastatin 40 mg daily Lab Results  Component Value Date   CHOL 177 07/08/2020   HDL 78.90 07/08/2020   LDLCALC 80 07/08/2020   LDLDIRECT 119.8 04/26/2009   TRIG 91.0 07/08/2020   CHOLHDL 2 07/08/2020  Using medications without problems: none Muscle aches:  none Diet compliance: good Exercise: walking some. Other complaints:  Hypertension:   BP NOT at goal .. on losartan HCTZ 100/25 mg daily  BP Readings from Last 3 Encounters:  07/15/20 (!) 150/82  06/24/20 (!) 152/77  03/25/20 126/84  Using medication without problems or lightheadedness: none Chest pain with exertion: none Edema:none Short of breath:none Average home BPs: Other issues: Wt Readings from Last 3 Encounters:  07/15/20 142 lb 4 oz (64.5 kg)  06/24/20 140 lb (63.5 kg)  03/25/20 137 lb (62.1 kg)          Relevant past medical, surgical, family and social history reviewed and updated as indicated. Interim medical history since our last visit reviewed. Allergies and medications reviewed and updated. Outpatient Medications Prior to Visit  Medication Sig Dispense Refill  . aspirin 81 MG tablet Take 81 mg by mouth at bedtime.     . Azelastine HCl 0.15 % SOLN May use 1-2 sprays per nostril up to twice a day as needed for sinus drainage. 30 mL 5  . fluticasone (FLONASE) 50 MCG/ACT nasal spray Place 1 spray into both nostrils daily as needed for allergies or rhinitis.    Marland Kitchen losartan-hydrochlorothiazide (HYZAAR) 100-25 MG tablet Take 1 tablet by mouth daily. 90 tablet 1  . memantine (NAMENDA) 10 MG tablet Take 1 tablet (10 mg total) by mouth 2 (two) times daily. 180 tablet 1  . metoprolol succinate (TOPROL-XL) 50 MG 24 hr tablet TAKE 1 TABLET BY MOUTH DAILY 90 tablet 1  . montelukast (SINGULAIR) 10 MG tablet TAKE 1 TABLET BY MOUTH AT BEDTIME 30 tablet 2  . RIVASTIGMINE TARTRATE PO Take 10 mg by mouth in the morning and at bedtime.    . simvastatin (ZOCOR) 40 MG tablet TAKE 1 TABLET BY MOUTH DAILY AT BEDTIME 90 tablet 3   No facility-administered medications prior to visit.     Per HPI unless specifically indicated in ROS section below Review of Systems  Constitutional: Negative for fatigue and fever.  HENT: Negative for congestion.   Eyes: Negative for pain.  Respiratory: Negative for cough and shortness of breath.   Cardiovascular: Negative for chest pain, palpitations and leg swelling.  Gastrointestinal: Negative for abdominal pain.  Genitourinary: Negative for dysuria and vaginal bleeding.  Musculoskeletal: Negative for  back pain.  Neurological: Negative for syncope, light-headedness and headaches.  Psychiatric/Behavioral: Negative for dysphoric mood.   Objective:  BP (!) 150/82   Pulse (!) 53   Temp 98.1 F (36.7 C) (Temporal)   Ht 5\' 2"  (1.575 m)   Wt 142 lb 4 oz (64.5 kg)   SpO2 96%   BMI 26.02 kg/m   Wt Readings from Last 3 Encounters:  07/15/20 142 lb 4 oz (64.5 kg)  06/24/20 140 lb (63.5 kg)  03/25/20 137 lb (62.1 kg)      Physical Exam Constitutional:      General: She is not in acute distress.Vital signs are normal.     Appearance: Normal appearance. She is well-developed and  well-nourished. She is not ill-appearing or toxic-appearing.  HENT:     Head: Normocephalic.     Right Ear: Hearing, tympanic membrane, ear canal and external ear normal. Tympanic membrane is not erythematous, retracted or bulging.     Left Ear: Hearing, tympanic membrane, ear canal and external ear normal. Tympanic membrane is not erythematous, retracted or bulging.     Nose: No mucosal edema or rhinorrhea.     Right Sinus: No maxillary sinus tenderness or frontal sinus tenderness.     Left Sinus: No maxillary sinus tenderness or frontal sinus tenderness.     Mouth/Throat:     Mouth: Oropharynx is clear and moist and mucous membranes are normal.     Pharynx: Uvula midline.  Eyes:     General: Lids are normal. Lids are everted, no foreign bodies appreciated.     Extraocular Movements: EOM normal.     Conjunctiva/sclera: Conjunctivae normal.     Pupils: Pupils are equal, round, and reactive to light.  Neck:     Thyroid: No thyroid mass or thyromegaly.     Vascular: No carotid bruit.     Trachea: Trachea normal.  Cardiovascular:     Rate and Rhythm: Normal rate and regular rhythm.     Pulses: Normal pulses and intact distal pulses.     Heart sounds: Normal heart sounds, S1 normal and S2 normal. No murmur heard. No friction rub. No gallop.   Pulmonary:     Effort: Pulmonary effort is normal. No tachypnea or respiratory distress.     Breath sounds: Normal breath sounds. No decreased breath sounds, wheezing, rhonchi or rales.  Abdominal:     General: Bowel sounds are normal.     Palpations: Abdomen is soft.     Tenderness: There is no abdominal tenderness.  Musculoskeletal:     Cervical back: Normal range of motion and neck supple.  Skin:    General: Skin is warm, dry and intact.     Findings: No rash.  Neurological:     Mental Status: She is alert.  Psychiatric:        Mood and Affect: Mood is not anxious or depressed.        Speech: Speech normal.        Behavior: Behavior  normal. Behavior is cooperative.        Thought Content: Thought content normal.        Cognition and Memory: Cognition and memory normal.        Judgment: Judgment normal.       Results for orders placed or performed in visit on 07/08/20  Comprehensive metabolic panel  Result Value Ref Range   Sodium 139 135 - 145 mEq/L   Potassium 3.8 3.5 - 5.1 mEq/L   Chloride 95 (  L) 96 - 112 mEq/L   CO2 37 (H) 19 - 32 mEq/L   Glucose, Bld 83 70 - 99 mg/dL   BUN 19 6 - 23 mg/dL   Creatinine, Ser 0.80 0.40 - 1.20 mg/dL   Total Bilirubin 0.8 0.2 - 1.2 mg/dL   Alkaline Phosphatase 66 39 - 117 U/L   AST 13 0 - 37 U/L   ALT 9 0 - 35 U/L   Total Protein 7.1 6.0 - 8.3 g/dL   Albumin 4.6 3.5 - 5.2 g/dL   GFR 68.56 >60.00 mL/min   Calcium 10.2 8.4 - 10.5 mg/dL  Lipid panel  Result Value Ref Range   Cholesterol 177 0 - 200 mg/dL   Triglycerides 91.0 0.0 - 149.0 mg/dL   HDL 78.90 >39.00 mg/dL   VLDL 18.2 0.0 - 40.0 mg/dL   LDL Cholesterol 80 0 - 99 mg/dL   Total CHOL/HDL Ratio 2    NonHDL 98.41   Hemoglobin A1c  Result Value Ref Range   Hgb A1c MFr Bld 6.2 4.6 - 6.5 %    This visit occurred during the SARS-CoV-2 public health emergency.  Safety protocols were in place, including screening questions prior to the visit, additional usage of staff PPE, and extensive cleaning of exam room while observing appropriate contact time as indicated for disinfecting solutions.   COVID 19 screen:  No recent travel or known exposure to COVID19 The patient denies respiratory symptoms of COVID 19 at this time. The importance of social distancing was discussed today.   Assessment and Plan    Problem List Items Addressed This Visit    Hyperlipidemia associated with type 2 diabetes mellitus ([place])    Stable, chronic.  Continue current medication.   Atorvastatin 40 mg daily      Hypertension associated with diabetes ([place]) - Primary    Borderline high for age... possibly SE to increased dose of namenda, but  this is only med she has been able to tolerate.  Follow BP at home.. if > 150/90 consistently consider decrease namenda or additional BP med.      Type 2 diabetes mellitus with circulatory disorder HTN (HCC)    Diet controlled. Encouraged exercise, weight loss, healthy eating habits.           [name], MD

## 2020-07-15 NOTE — Assessment & Plan Note (Signed)
Stable, chronic.  Continue current medication.   Atorvastatin 40 mg daily. 

## 2020-07-15 NOTE — Assessment & Plan Note (Signed)
Diet controlled. Encouraged exercise, weight loss, healthy eating habits.  

## 2020-07-15 NOTE — Assessment & Plan Note (Signed)
Borderline high for age... possibly SE to increased dose of namenda, but this is only med she has been able to tolerate.  Follow BP at home.. if > 150/90 consistently consider decrease namenda or additional BP med.

## 2020-08-19 ENCOUNTER — Encounter: Payer: Self-pay | Admitting: Radiology

## 2020-08-19 ENCOUNTER — Encounter: Payer: Self-pay | Admitting: *Deleted

## 2020-08-19 ENCOUNTER — Telehealth: Payer: Self-pay | Admitting: Interventional Cardiology

## 2020-08-19 ENCOUNTER — Ambulatory Visit (INDEPENDENT_AMBULATORY_CARE_PROVIDER_SITE_OTHER): Payer: PPO

## 2020-08-19 DIAGNOSIS — R002 Palpitations: Secondary | ICD-10-CM

## 2020-08-19 NOTE — Telephone Encounter (Signed)
Spoke with pt and made her aware of recommendation from Dr. [name].  Pt agreeable to plan.  Advised I will send instructions over through [place].

## 2020-08-19 NOTE — Telephone Encounter (Signed)
Place a 2-week monitor

## 2020-08-19 NOTE — Addendum Note (Signed)
Addended by: Loren Racer on: 08/19/2020 05:19 PM   Modules accepted: Orders

## 2020-08-19 NOTE — Telephone Encounter (Addendum)
Pt called to report that she has been having palpitations for the past few weeks but the last 3 days she has been feeling her heart beat "hard" constantly. She denies any other symptoms associated with it... no dizziness, SOB, no chest pain.. no added caffeine and no new meds/ OTC. No recent illness and she says she has been hydrating well.   BP 132/60 and HR 56 today and has been staying consistent   She is taking all of her meds including Metoprolol 50 mg daily.   Pt last seen 10/2019. Unable to find her an APP appt this week. Will forward to Dr. [name] for his recommendations and possibly a monitor with follow up thereafter.

## 2020-08-19 NOTE — Telephone Encounter (Signed)
Patient c/o Palpitations:  High priority if patient c/o lightheadedness, shortness of breath, or chest pain  1) How long have you had palpitations/irregular HR/ Afib? Are you having the symptoms now? 3 days, yes  2) Are you currently experiencing lightheadedness, SOB or CP? Little bit of dizziness not now, comes and goes  3) Do you have a history of afib (atrial fibrillation) or irregular heart rhythm? yes  4) Have you checked your BP or HR? (document readings if available): 159/91, yesterday 130/62 HR 56  5) Are you experiencing any other symptoms? No   Patient's husband states the patient has been having a hard heart beat for the last 3 days. He states she has also been having pain in her left shoulder and upp er back. He states she does het a little bit of dizziness that comes and goes, but is not experiencing it now. He states she also has diarrhea, but that may just be her medications.

## 2020-08-19 NOTE — Progress Notes (Signed)
Enrolled patient for a 14 day Zio XT Monitor to be mailed to patients home  

## 2020-08-20 ENCOUNTER — Other Ambulatory Visit: Payer: Self-pay

## 2020-08-20 ENCOUNTER — Encounter: Payer: Self-pay | Admitting: Family Medicine

## 2020-08-20 ENCOUNTER — Ambulatory Visit (INDEPENDENT_AMBULATORY_CARE_PROVIDER_SITE_OTHER): Payer: PPO | Admitting: Family Medicine

## 2020-08-20 DIAGNOSIS — M542 Cervicalgia: Secondary | ICD-10-CM

## 2020-08-20 DIAGNOSIS — G3184 Mild cognitive impairment, so stated: Secondary | ICD-10-CM

## 2020-08-20 MED ORDER — HYDROCODONE-ACETAMINOPHEN 5-325 MG PO TABS: 0.5000 | ORAL_TABLET | Freq: Three times a day (TID) | ORAL | 0 refills | Status: DC | PRN

## 2020-08-20 NOTE — Progress Notes (Signed)
This visit occurred during the SARS-CoV-2 public health emergency.  Safety protocols were in place, including screening questions prior to the visit, additional usage of staff PPE, and extensive cleaning of exam room while observing appropriate contact time as indicated for disinfecting solutions.  Neck and shoulder pain.  Woke up 4 days ago with L sided neck and back pain.  No R sided sx.  It got some better in the meantime.  Used a leftover hydrocodone it helped for about 1 day, after 1 dose.  No arm or leg sx.  No FCNAVD.  No rash.  Normal grip.  No trauma, no injury to cause this. She had a fall about 4 weeks ago, likely not contributory.  Tripped on a box, wasn't lightheaded with the fall.  Pillow is still in good shape. Heat helps a little.  voltaren gel and tylenol tried but that didn't help a lot.    She had memory troubles over the last few years and her mood has been down about that.  D/w pt.  Her mother had h/o memory loss.  Per husband, memory had been stable over the last few months, with some days better than others.  Discussed.  Meds, vitals, and allergies reviewed.   ROS: Per HPI unless specifically indicated in ROS section   GEN: nad, alert and oriented HEENT: ncat NECK: supple w/o LA CV: rrr.  PULM: ctab, no inc wob EXT: no edema SKIN: Well-perfused.  No bruising. Normal neck range of motion, not tender to palpation in the midline.  Left trapezius diffusely tender without bruising.  Normal grip and hand range of motion bilaterally.  Normal strength and sensation of the lower extremities.  32 minutes were devoted to patient care in this encounter (this includes time spent reviewing the patient's file/history, interviewing and examining the patient, counseling/reviewing plan with patient).

## 2020-08-20 NOTE — Patient Instructions (Signed)
Use hydrocodone only if needed.  Gently stretch and use heat as needed. Update [place] as needed.  Take care.  Glad to see you.

## 2020-08-22 NOTE — Assessment & Plan Note (Signed)
With memory loss.  Discussed.  Reasonable to continue Namenda as prescribed and continue compensation techniques, making lists, etc.  Husband is supportive.

## 2020-08-22 NOTE — Assessment & Plan Note (Signed)
Likely from trapezius spasm.  Routine cautions given to patient.  She had relief with hydrocodone previously.  Other medications did not help.  Routine medication cautions discussed.  Reasonable to use a low-dose of hydrocodone as needed with sedation and fall cautions in the meantime.  Would avoid NSAIDs since they did not help previously.  She agrees with plan.  No need for imaging at this point.

## 2020-08-26 DIAGNOSIS — R002 Palpitations: Secondary | ICD-10-CM | POA: Diagnosis not present

## 2020-09-01 ENCOUNTER — Telehealth: Payer: Self-pay

## 2020-09-01 NOTE — Progress Notes (Signed)
ENTERED IN ERROR. NON CCM

## 2020-09-07 ENCOUNTER — Other Ambulatory Visit: Payer: Self-pay | Admitting: Family Medicine

## 2020-09-16 DIAGNOSIS — R002 Palpitations: Secondary | ICD-10-CM | POA: Diagnosis not present

## 2020-09-17 ENCOUNTER — Other Ambulatory Visit: Payer: Self-pay

## 2020-09-17 ENCOUNTER — Ambulatory Visit (INDEPENDENT_AMBULATORY_CARE_PROVIDER_SITE_OTHER)
Admission: RE | Admit: 2020-09-17 | Discharge: 2020-09-17 | Disposition: A | Discharge status: Home / Self Care | Payer: PPO | Source: Ambulatory Visit | Attending: Family Medicine | Admitting: Family Medicine

## 2020-09-17 ENCOUNTER — Ambulatory Visit (INDEPENDENT_AMBULATORY_CARE_PROVIDER_SITE_OTHER): Payer: PPO | Admitting: Family Medicine

## 2020-09-17 VITALS — BP 144/80 | HR 85 | Temp 98.4°F | Ht 62.0 in | Wt 145.0 lb

## 2020-09-17 DIAGNOSIS — M542 Cervicalgia: Secondary | ICD-10-CM | POA: Diagnosis not present

## 2020-09-17 MED ORDER — PREDNISONE 20 MG PO TABS: ORAL_TABLET | ORAL | 0 refills | Status: DC

## 2020-09-17 NOTE — Patient Instructions (Addendum)
We will call with X-ray results. Complete prednisone. Start upper back stretching. Start heat on neck. If not improving in 2 weeks, follow up.

## 2020-09-17 NOTE — Progress Notes (Signed)
Patient ID: Janet Chapman, female    DOB: 08/11/1935, 85 y.o.   MRN: [id number]  This visit was conducted in person.  BP (!) 144/80   Pulse 85   Temp 98.4 F (36.9 C) (Temporal)   Ht 5\' 2"  (1.575 m)   Wt 145 lb (65.8 kg)   SpO2 97%   BMI 26.52 kg/m    CC:  Chief Complaint  Patient presents with  . Fatigue  . Neck Pain    Down into cervical area    Subjective:   HPI: Janet Chapman is a 85 y.o. female presenting on 09/17/2020 for Fatigue and Neck Pain (Down into cervical area)   1. Fatigue: she has not been having much fatigue... neck pain is what is bothering her.  2. Neck pain: Seen by Dr. Damita Dunnings on 08/22/2020.Marland Kitchen dx with trapezius muscle strain.. Given low dose hydrocodone to use prn... that helped temporarily.  Now returned in last few days central neck pain.Marland Kitchen no radiation to arms. She did have a fall  In early April.Marland Kitchen landed on back... pain started after this. No numbness or weakness.  Pain 8/10 on pain scale.. not keeping her up at night.  using heat some. Tylenol helps a small amount.  Had similar issue in 2012   cervical X-ray reviewed: Disc and endplate degeneration is noted and maximal at C5- C6.   Relevant past medical, surgical, family and social history reviewed and updated as indicated. Interim medical history since our last visit reviewed. Allergies and medications reviewed and updated. Outpatient Medications Prior to Visit  Medication Sig Dispense Refill  . Apoaequorin (PREVAGEN PO) Take by mouth.    Marland Kitchen aspirin 81 MG tablet Take 81 mg by mouth at bedtime.    Marland Kitchen losartan-hydrochlorothiazide (HYZAAR) 100-25 MG tablet Take 1 tablet by mouth daily. 90 tablet 1  . memantine (NAMENDA) 10 MG tablet Take 1 tablet (10 mg total) by mouth 2 (two) times daily. 180 tablet 1  . metoprolol succinate (TOPROL-XL) 50 MG 24 hr tablet TAKE 1 TABLET BY MOUTH DAILY 90 tablet 1  . montelukast (SINGULAIR) 10 MG tablet TAKE 1 TABLET BY MOUTH AT BEDTIME 30 tablet 2  . simvastatin  (ZOCOR) 40 MG tablet TAKE 1 TABLET BY MOUTH DAILY AT BEDTIME 90 tablet 3  . fluticasone (FLONASE) 50 MCG/ACT nasal spray Place 1 spray into both nostrils daily as needed for allergies or rhinitis. (Patient not taking: Reported on 09/17/2020)    . HYDROcodone-acetaminophen (NORCO/VICODIN) 5-325 MG tablet Take 0.5-1 tablets by mouth 3 (three) times daily as needed for moderate pain. (Patient not taking: Reported on 09/17/2020) 15 tablet 0   No facility-administered medications prior to visit.     Per HPI unless specifically indicated in ROS section below Review of Systems  Constitutional: Negative for fatigue and fever.  HENT: Negative for congestion.   Eyes: Negative for pain.  Respiratory: Negative for cough and shortness of breath.   Cardiovascular: Negative for chest pain, palpitations and leg swelling.  Gastrointestinal: Negative for abdominal pain.  Genitourinary: Negative for dysuria and vaginal bleeding.  Musculoskeletal: Negative for back pain.  Neurological: Negative for syncope, light-headedness and headaches.  Psychiatric/Behavioral: Negative for dysphoric mood.   Objective:  BP (!) 144/80   Pulse 85   Temp 98.4 F (36.9 C) (Temporal)   Ht 5\' 2"  (1.575 m)   Wt 145 lb (65.8 kg)   SpO2 97%   BMI 26.52 kg/m   Wt Readings from Last 3 Encounters:  09/17/20 145 lb (65.8 kg)  08/20/20 142 lb (64.4 kg)  07/15/20 142 lb 4 oz (64.5 kg)      Physical Exam Constitutional:      General: She is not in acute distress.    Appearance: Normal appearance. She is well-developed. She is not ill-appearing or toxic-appearing.  HENT:     Head: Normocephalic.     Right Ear: Hearing, tympanic membrane, ear canal and external ear normal. Tympanic membrane is not erythematous, retracted or bulging.     Left Ear: Hearing, tympanic membrane, ear canal and external ear normal. Tympanic membrane is not erythematous, retracted or bulging.     Nose: No mucosal edema or rhinorrhea.     Right Sinus:  No maxillary sinus tenderness or frontal sinus tenderness.     Left Sinus: No maxillary sinus tenderness or frontal sinus tenderness.     Mouth/Throat:     Pharynx: Uvula midline.  Eyes:     General: Lids are normal. Lids are everted, no foreign bodies appreciated.     Conjunctiva/sclera: Conjunctivae normal.     Pupils: Pupils are equal, round, and reactive to light.  Neck:     Thyroid: No thyroid mass or thyromegaly.     Vascular: No carotid bruit.     Trachea: Trachea normal.  Cardiovascular:     Rate and Rhythm: Normal rate and regular rhythm.     Pulses: Normal pulses.     Heart sounds: Normal heart sounds, S1 normal and S2 normal. No murmur heard. No friction rub. No gallop.   Pulmonary:     Effort: Pulmonary effort is normal. No tachypnea or respiratory distress.     Breath sounds: Normal breath sounds. No decreased breath sounds, wheezing, rhonchi or rales.  Abdominal:     General: Bowel sounds are normal.     Palpations: Abdomen is soft.     Tenderness: There is no abdominal tenderness.  Musculoskeletal:     Cervical back: Neck supple. Spasms, tenderness and bony tenderness present. Pain with movement present. Decreased range of motion.     Comments: Neg spurling Bialterally  Skin:    General: Skin is warm and dry.     Findings: No rash.  Neurological:     Mental Status: She is alert.  Psychiatric:        Mood and Affect: Mood is not anxious or depressed.        Speech: Speech normal.        Behavior: Behavior normal. Behavior is cooperative.        Thought Content: Thought content normal.        Judgment: Judgment normal.       Results for orders placed or performed in visit on 07/08/20  Comprehensive metabolic panel  Result Value Ref Range   Sodium 139 135 - 145 mEq/L   Potassium 3.8 3.5 - 5.1 mEq/L   Chloride 95 (L) 96 - 112 mEq/L   CO2 37 (H) 19 - 32 mEq/L   Glucose, Bld 83 70 - 99 mg/dL   BUN 19 6 - 23 mg/dL   Creatinine, Ser 0.80 0.40 - 1.20 mg/dL    Total Bilirubin 0.8 0.2 - 1.2 mg/dL   Alkaline Phosphatase 66 39 - 117 U/L   AST 13 0 - 37 U/L   ALT 9 0 - 35 U/L   Total Protein 7.1 6.0 - 8.3 g/dL   Albumin 4.6 3.5 - 5.2 g/dL   GFR 68.56 >60.00 mL/min   Calcium 10.2  8.4 - 10.5 mg/dL  Lipid panel  Result Value Ref Range   Cholesterol 177 0 - 200 mg/dL   Triglycerides 91.0 0.0 - 149.0 mg/dL   HDL 78.90 >39.00 mg/dL   VLDL 18.2 0.0 - 40.0 mg/dL   LDL Cholesterol 80 0 - 99 mg/dL   Total CHOL/HDL Ratio 2    NonHDL 98.41   Hemoglobin A1c  Result Value Ref Range   Hgb A1c MFr Bld 6.2 4.6 - 6.5 %    This visit occurred during the SARS-CoV-2 public health emergency.  Safety protocols were in place, including screening questions prior to the visit, additional usage of staff PPE, and extensive cleaning of exam room while observing appropriate contact time as indicated for disinfecting solutions.   COVID 19 screen:  No recent travel or known exposure to COVID19 The patient denies respiratory symptoms of COVID 19 at this time. The importance of social distancing was discussed today.   Assessment and Plan    Problem List Items Addressed This Visit    Neck pain - Primary      Given fall and age as well as persistence > 1 month despite tylenol, heat and  Hydrocodone... will eval with X-ray.   Possible DDD, arthritis flare... no sign of radiculopathy.  Will treat with prednisone taper ( DM well controlled), heat and upper back stretching.          [name], MD

## 2020-09-17 NOTE — Assessment & Plan Note (Signed)
Given fall and age as well as persistence > 1 month despite tylenol, heat and  Hydrocodone... will eval with X-ray.   Possible DDD, arthritis flare... no sign of radiculopathy.  Will treat with prednisone taper ( DM well controlled), heat and upper back stretching.

## 2020-09-24 ENCOUNTER — Telehealth: Payer: Self-pay | Admitting: Interventional Cardiology

## 2020-09-24 NOTE — Telephone Encounter (Signed)
Informed pt of results. Pt verbalized understanding. 

## 2020-09-24 NOTE — Telephone Encounter (Signed)
Patient was returning call for results 

## 2020-09-27 ENCOUNTER — Telehealth: Payer: Self-pay

## 2020-09-27 NOTE — Telephone Encounter (Signed)
Pt notified as instructed and voiced understanding and pt said we did discuss this this morning and pt said she is doing better right now and pt appreciates our concern. Pt said she will see Dr Einar Pheasant on 09/28/20.sending note to Dr [name] as Juluis Rainier.

## 2020-09-27 NOTE — Telephone Encounter (Signed)
Noted.. if headache worsening to severe or additional neurologic symptoms.. needs to go to ER stat.. was this communicated to patient?  Probably covered in ER precautions, correct? Please document or notify pt.

## 2020-09-27 NOTE — Telephone Encounter (Signed)
Noted will evaluate tomorrow 

## 2020-09-27 NOTE — Telephone Encounter (Signed)
Pt and pts husband on phone (DPR signed) pt fell 2 - 3 months ago where pt slid down side of car and pt did hit head on cement but pt did not "fall hard" and did not lose consciousness. Pt saw Dr Damita Dunnings on 08/20/20 and Dr [name] on 09/17/20. Starting on 09/25/20 pt having lightheadedness, no room spinning which has been consistent for the 2 day period. Pt has slight headache at back of head, and pt has finished the prednisone and starting to have neck pain again.pt has had memory issues for 2 - 3 years but seems worse since lightheaded.  Pt has had some sinus drainage but no sinus congestion. BP on 09/26/20 was 140/74 P ?.  Pt does not want to go to UC or ED and scheduled 40' appt in office with Dr Einar Pheasant on 09/28/20 at 10:40. UC & ED precautions given and pt and pts husband voiced understanding. Sending note to Dr [name] as PCP and Dr Einar Pheasant.

## 2020-09-28 ENCOUNTER — Encounter: Payer: Self-pay | Admitting: Family Medicine

## 2020-09-28 ENCOUNTER — Ambulatory Visit (INDEPENDENT_AMBULATORY_CARE_PROVIDER_SITE_OTHER): Payer: PPO | Admitting: Family Medicine

## 2020-09-28 ENCOUNTER — Other Ambulatory Visit: Payer: Self-pay

## 2020-09-28 VITALS — BP 122/80 | HR 78 | Temp 96.9°F | Ht 62.0 in | Wt 142.5 lb

## 2020-09-28 DIAGNOSIS — M542 Cervicalgia: Secondary | ICD-10-CM | POA: Diagnosis not present

## 2020-09-28 DIAGNOSIS — G3184 Mild cognitive impairment, so stated: Secondary | ICD-10-CM | POA: Diagnosis not present

## 2020-09-28 DIAGNOSIS — R42 Dizziness and giddiness: Secondary | ICD-10-CM | POA: Insufficient documentation

## 2020-09-28 LAB — COMPREHENSIVE METABOLIC PANEL
ALT: 9 U/L (ref 0–35)
AST: 12 U/L (ref 0–37)
Albumin: 4.5 g/dL (ref 3.5–5.2)
Alkaline Phosphatase: 60 U/L (ref 39–117)
BUN: 22 mg/dL (ref 6–23)
CO2: 36 mEq/L — ABNORMAL HIGH (ref 19–32)
Calcium: 10.1 mg/dL (ref 8.4–10.5)
Chloride: 93 mEq/L — ABNORMAL LOW (ref 96–112)
Creatinine, Ser: 0.85 mg/dL (ref 0.40–1.20)
GFR: 63.64 mL/min (ref >60.00)
Glucose, Bld: 113 mg/dL — ABNORMAL HIGH (ref 70–99)
Potassium: 3.8 mEq/L (ref 3.5–5.1)
Sodium: 137 mEq/L (ref 135–145)
Total Bilirubin: 1 mg/dL (ref 0.2–1.2)
Total Protein: 6.9 g/dL (ref 6.0–8.3)

## 2020-09-28 LAB — CBC
HCT: 41.3 % (ref 36.0–46.0)
Hemoglobin: 14.3 g/dL (ref 12.0–15.0)
MCHC: 34.6 g/dL (ref 30.0–36.0)
MCV: 93.3 fl (ref 78.0–100.0)
Platelets: 247 10*3/uL (ref 150.0–400.0)
RBC: 4.42 Mil/uL (ref 3.87–5.11)
RDW: 14.1 % (ref 11.5–15.5)
WBC: 7.2 10*3/uL (ref 4.0–10.5)

## 2020-09-28 NOTE — Progress Notes (Signed)
Subjective:     Janet Chapman is a 85 y.o. female presenting for Dizziness (X months, after fall ) and Headache (Back of head )     HPI  #Dizziness - denies room spinning - "things are not right in her head" - endorses pain in the back of the head - dizziness husband notes has been for months - when she has head pain will "see things in her head" denies visual hallucinations or audio. Notes these are images in the head  At the time of the fall did not get evaluation for this No immediate symptoms following the fall  Initially thought the dizziness might be due to the memantine - did have dose increase 06/2020 -- they did decrease back to a previously tolerated dose w/o a change in her dizziness  #neck pain - and headache  - improved with steroids but is returning now that she has stopped her  - initially saw Damita Dunnings 4/8 and started steroids on 5/6 with Dr. [name] - no weakness, tingling, numbness  Review of Systems  09/27/2020: [facility] call - fall 2-3 months ago. 5/14 with lightheadedness and slight HA and neck pain. Was seen on 4/8 and started on prednisone for neck pain. Memory worse with the lightheadedness  Social History   Tobacco Use  Smoking Status Former Smoker  Smokeless Tobacco Never Used  Tobacco Comment   In college        Objective:    BP Readings from Last 3 Encounters:  09/28/20 122/80  09/17/20 (!) 144/80  08/20/20 120/82   Wt Readings from Last 3 Encounters:  09/28/20 142 lb 8 oz (64.6 kg)  09/17/20 145 lb (65.8 kg)  08/20/20 142 lb (64.4 kg)    BP 122/80   Pulse 78   Temp (!) 96.9 F (36.1 C) (Temporal)   Ht 5\' 2"  (1.575 m)   Wt 142 lb 8 oz (64.6 kg)   SpO2 97%   BMI 26.06 kg/m    Physical Exam Constitutional:      General: She is not in acute distress.    Appearance: She is well-developed. She is not diaphoretic.  HENT:     Head: Normocephalic and atraumatic.     Right Ear: External ear normal.     Left Ear: External ear  normal.     Nose: Nose normal.  Eyes:     General: No scleral icterus.    Extraocular Movements: Extraocular movements intact.     Conjunctiva/sclera: Conjunctivae normal.     Pupils: Pupils are equal, round, and reactive to light.  Cardiovascular:     Rate and Rhythm: Normal rate and regular rhythm.     Heart sounds: No murmur heard.   Pulmonary:     Effort: Pulmonary effort is normal. No respiratory distress.     Breath sounds: Normal breath sounds. No wheezing.  Musculoskeletal:     Cervical back: Neck supple.  Skin:    General: Skin is warm and dry.     Capillary Refill: Capillary refill takes less than 2 seconds.  Neurological:     Mental Status: She is alert. Mental status is at baseline.  Psychiatric:        Mood and Affect: Mood normal.        Behavior: Behavior normal.           Assessment & Plan:   Problem List Items Addressed This Visit      Other   Acute neck pain - Primary  Reviewed prior XR with concern for possible compression and MRI if not improving. Completed steroids w/ improvement but immediate return. Will get MRI to further evaluate and start PT to help with improvement.       Relevant Orders   MR Cervical Spine Wo Contrast   Ambulatory referral to Physical Therapy   Mild cognitive impairment    Pt with progression of memory concerns. She is on memantine and decreased dose w/o improvement in dizziness. Following with neurology. May need to consider additional dose decrease to see if impact on dizziness if no other cause.       Relevant Orders   MR Brain Wo Contrast   Dizziness    Etiology unclear - appears to have 2 kind of dizziness. One consistent with orthostatic (lightheaded, presyncope) and another one of unclear etiology. Will do slow position change and hydration for orthostatic symptoms. Will get MRI brain given fall hx, balance concern, neck/headache, and dizziness to evaluate for intracranial pathology. Given duration of symptoms  lower suspicion for acute stroke or other acute etiology at this time.       Relevant Orders   MR Brain Wo Contrast   Comprehensive metabolic panel   CBC       Return if symptoms worsen or fail to improve.  Lesleigh Noe, MD  This visit occurred during the SARS-CoV-2 public health emergency.  Safety protocols were in place, including screening questions prior to the visit, additional usage of staff PPE, and extensive cleaning of exam room while observing appropriate contact time as indicated for disinfecting solutions.

## 2020-09-28 NOTE — Assessment & Plan Note (Signed)
Etiology unclear - appears to have 2 kind of dizziness. One consistent with orthostatic (lightheaded, presyncope) and another one of unclear etiology. Will do slow position change and hydration for orthostatic symptoms. Will get MRI brain given fall hx, balance concern, neck/headache, and dizziness to evaluate for intracranial pathology. Given duration of symptoms lower suspicion for acute stroke or other acute etiology at this time.

## 2020-09-28 NOTE — Assessment & Plan Note (Signed)
Pt with progression of memory concerns. She is on memantine and decreased dose w/o improvement in dizziness. Following with neurology. May need to consider additional dose decrease to see if impact on dizziness if no other cause.

## 2020-09-28 NOTE — Patient Instructions (Addendum)
#  Lightheadedness - drink more decaf tea or water - slow position changes - drink at least 50 oz of fluid without caffeine per day  #Neck pain and head pain - we will get imaging done to evaluate - physical therapy  #Referral I have placed a referral to a specialist for you. You should receive a phone call from the specialty office. Make sure your voicemail is not full and that if you are able to answer your phone to unknown or new numbers.   It may take up to 2 weeks to hear about the referral. If you do not hear anything in 2 weeks, please call our office and ask to speak with the referral coordinator.    Curcumin or Tumeric  Research has shown that Curcumin (Tumeric) supplements are just as good as high dose anti-inflammatory medications (like diclofenac and ibuprofen) at treating pain from osteoarthritis without the side effects.   Take Curcumin 500 mg Three times daily   -- You can find a bottle at [employer] for $8 which should last a month -- Bath is around $9 and is available at [employer]

## 2020-09-28 NOTE — Assessment & Plan Note (Signed)
Reviewed prior XR with concern for possible compression and MRI if not improving. Completed steroids w/ improvement but immediate return. Will get MRI to further evaluate and start PT to help with improvement.

## 2020-10-12 ENCOUNTER — Telehealth: Payer: Self-pay | Admitting: Neurology

## 2020-10-12 NOTE — Telephone Encounter (Signed)
I called and relayed to patient Janet Chapman's instructions.  We will reassess in August when they come in to see her again.  She may stop the memantine.  Patient verbalized understanding and repeated back to me.  Her husband is to call back if questions.

## 2020-10-12 NOTE — Telephone Encounter (Signed)
Unfortunately has been quite sensitive to memory medications. Let's hold off of trying anything else right now. Agree to stop the Namenda. Can readdress at next visit. Give some time to improve.

## 2020-10-12 NOTE — Telephone Encounter (Signed)
I called pt then spoke to husband.  She had been taking only 1 -1.5 tablets of memantine 10mg  Tabs daily.  Has noted on and off of generalized itching/ rash (noted last week). Stopped the memantine and rash/ itch went away after 4 days.  Then lat night took 5 mg and had the itching this morning.  Will stop the medication.  She has been on the increased dose since 06-24-20.  Had exelon patch SE, I see donepezil had nausea back in 2016.  You mention the possibility of that again. Please advise.

## 2020-10-12 NOTE — Telephone Encounter (Signed)
Pt called, having a reaction to memantine (NAMENDA) 10 MG tablet, itching on my face and body off and on for a couple months. Have stop taking the medication. Can you prescribe something else. Would like a call from the nurse.

## 2020-10-16 ENCOUNTER — Ambulatory Visit
Admission: RE | Admit: 2020-10-16 | Discharge: 2020-10-16 | Disposition: A | Discharge status: Home / Self Care | Payer: PPO | Source: Ambulatory Visit | Attending: Family Medicine | Admitting: Family Medicine

## 2020-10-16 DIAGNOSIS — R413 Other amnesia: Secondary | ICD-10-CM | POA: Diagnosis not present

## 2020-10-16 DIAGNOSIS — M542 Cervicalgia: Secondary | ICD-10-CM

## 2020-10-16 DIAGNOSIS — R42 Dizziness and giddiness: Secondary | ICD-10-CM

## 2020-10-16 DIAGNOSIS — I6381 Other cerebral infarction due to occlusion or stenosis of small artery: Secondary | ICD-10-CM | POA: Diagnosis not present

## 2020-10-16 DIAGNOSIS — G3184 Mild cognitive impairment, so stated: Secondary | ICD-10-CM

## 2020-10-16 DIAGNOSIS — J341 Cyst and mucocele of nose and nasal sinus: Secondary | ICD-10-CM | POA: Diagnosis not present

## 2020-10-16 DIAGNOSIS — M4802 Spinal stenosis, cervical region: Secondary | ICD-10-CM | POA: Diagnosis not present

## 2020-10-18 ENCOUNTER — Other Ambulatory Visit: Payer: Self-pay | Admitting: Family Medicine

## 2020-10-18 ENCOUNTER — Other Ambulatory Visit: Payer: Self-pay

## 2020-10-18 ENCOUNTER — Ambulatory Visit: Payer: PPO | Attending: Family Medicine

## 2020-10-18 DIAGNOSIS — M542 Cervicalgia: Secondary | ICD-10-CM | POA: Diagnosis not present

## 2020-10-18 DIAGNOSIS — M4802 Spinal stenosis, cervical region: Secondary | ICD-10-CM

## 2020-10-18 DIAGNOSIS — M6281 Muscle weakness (generalized): Secondary | ICD-10-CM | POA: Diagnosis not present

## 2020-10-18 NOTE — Therapy (Signed)
[place], [place], [address] Phone: [phone]   Fax:  [phone]  Physical Therapy Evaluation  Patient Details  Name: Janet Chapman MRN: [id number] Date of Birth: [birth year] Referring Provider (PT): Lesleigh Noe, MD   Encounter Date: 10/18/2020   PT End of Session - 10/18/20 1121    Visit Number 1    Number of Visits 6    Date for PT Re-Evaluation 12/06/20    Authorization Type [employer]    Progress Note Due on Visit 10    PT Start Time 1125    PT Stop Time 1213    PT Time Calculation (min) 48 min    Activity Tolerance Patient tolerated treatment well    Behavior During Therapy [facility] for tasks assessed/performed           Past Medical History:  Diagnosis Date  . Allergic rhinitis   . Diabetes mellitus without complication (HCC)    diet controlled  . GERD (gastroesophageal reflux disease)   . Hyperlipidemia   . Hypertension   . Memory loss   . Osteoarthritis   . Urinary incontinence     Past Surgical History:  Procedure Laterality Date  . ABDOMINAL HYSTERECTOMY  1993   TA  . CARDIOVASCULAR STRESS TEST  2004   H. Smith, cardiolite-normal  . CHOLECYSTECTOMY  [phone]  . COLONOSCOPY    . LEFT HEART CATH AND CORONARY ANGIOGRAPHY N/A 11/06/2017   Procedure: LEFT HEART CATH AND CORONARY ANGIOGRAPHY;  Surgeon: Burnell Blanks, MD;  Location: [facility];  Service: Cardiovascular;  Laterality: N/A;    There were no vitals filed for this visit.    Subjective Assessment - 10/18/20 1131    Subjective Pt is a pleasant 85 y/o F who presents to PT with reports of subacute neck pain and disocmfort. She reports that she had a headache and neck pain that started with sudden onset in early April 2022, although she believes this could have been due to a fall the previous week. Pt does note recent improvement in symptoms with exercises prescribed by MD. Prednisone pack also  helped in early May of 2022. She promotes most pain later in the afteroon during the day, with little pain in the morning. Denies changes in bowel/bladder or saddle anesthesia.    Pertinent History roughly 2 month history of intermittent posterior neck pain; no MOI with exception of fall in late March    Limitations Reading;Lifting    How long can you sit comfortably? indefinite    How long can you stand comfortably? indefinite    How long can you walk comfortably? indefinite    Diagnostic tests Cervical Spine MRI: IMPRESSION:  1. Generally mild for age cervical spine degeneration. Disc and  endplate degeneration most pronounced at C5-C6. Superimposed  widespread facet hypertrophy, with up to severe left facet  degeneration at C4-C5 where there is mild anterolisthesis.  2. Mild spinal stenosis at C5-C6 with up to mild cord mass effect  but no cord signal abnormality.  3. Moderate to severe neural foraminal stenosis at the left C4 and  bilateral C6 nerve levels.    Patient Stated Goals Pt would like to decrease neck pain in order to improve comfort and function    Currently in Pain? Yes    Pain Score 3    above a 5/10 in last few weeks   Pain Location Neck    Pain Orientation Right;Left    Pain  Descriptors / Indicators Sharp    Pain Type Acute pain    Pain Onset More than a month ago    Pain Frequency Intermittent    Aggravating Factors  none    Pain Relieving Factors exercises, heating pad              [facility] PT Assessment - 10/18/20 0001      Assessment   Medical Diagnosis M54.2 (ICD-10-CM) - Acute neck pain    Referring Provider (PT) Lesleigh Noe, MD    Hand Dominance Right      Precautions   Precautions None      Restrictions   Weight Bearing Restrictions No      Balance Screen   Has the patient fallen in the past 6 months Yes    How many times? Fall in late March    Has the patient had a decrease in activity level because of a fear of falling?  No    Is the patient  reluctant to leave their home because of a fear of falling?  No      Home Environment   Living Environment Private residence    Living Arrangements Spouse/significant other    Type of [place] to enter    Entrance Stairs-Number of Steps 2    Entrance Stairs-Rails None    Home Layout One level    [place] - single point      Prior Function   Level of Independence Independent;Independent with basic ADLs   does have someone help with cleaning 1x/wk     Cognition   Overall Cognitive Status Within Functional Limits for tasks assessed    Attention Focused      Observation/Other Assessments   Focus on Therapeutic Outcomes (FOTO)  62% function      Posture/Postural Control   Posture Comments fwd head posture with rounded shoulders bilaterally; increased thoracic kyphosis      ROM / Strength   AROM / PROM / Strength AROM;Strength      AROM   AROM Assessment Site Cervical    Cervical Flexion 30    Cervical Extension 35    Cervical - Right Rotation 30   pain   Cervical - Left Rotation 40      Strength   Overall Strength Comments UE Myotomes WNL      Palpation   Palpation comment TTP to bilateral levator scapulae, upper traps, slight tenderness at C7                      Objective measurements completed on examination: See above findings.       [facility] Adult PT Treatment/Exercise - 10/18/20 0001      Exercises   Exercises Neck      Neck Exercises: Seated   Neck Retraction 5 reps;3 secs      Neck Exercises: Supine   Neck Retraction 5 reps;3 secs      Shoulder Exercises: Seated   Retraction 5 reps;Both    Row 10 reps;Both;Theraband    Theraband Level (Shoulder Row) Level 2 (Red)                  PT Education - 10/18/20 1217    Education Details eval findings, HEP, POC    Person(s) Educated Patient    Methods Explanation;Demonstration;Handout    Comprehension Verbalized understanding;Returned demonstration             PT  Short Term Goals - 10/18/20 1226      PT SHORT TERM GOAL #1   Title Pt will be compliant and knowledgeable with initial HEP    Baseline initial HEP given    Time 3    Period Weeks    Status New    Target Date 11/08/20      PT SHORT TERM GOAL #2   Title Pt will self report neck pain no greater than 3/10 at worst in order to improve comfort and function    Baseline 5/10 at worst    Time 3    Period Weeks    Status New    Target Date 11/08/20             PT Long Term Goals - 10/18/20 1227      PT LONG TERM GOAL #1   Title Pt will improve FOTO score to no less than 69% function for improved confidence and functional ability    Baseline 62% function    Time 7    Period Days    Status New    Target Date 12/06/20      PT LONG TERM GOAL #2   Title Pt will improve bilateral cervical rotation to no less than 50 degrees for improved ADL performance    Baseline see flowsheet    Time 7    Period Weeks    Status New    Target Date 12/06/20      PT LONG TERM GOAL #3   Title Pt will self report neck pain no greater than 1/10 at worst in order to improve comfort and functional ability    Baseline 5/10 at worst    Time 7    Period Weeks    Status New    Target Date 12/06/20      PT LONG TERM GOAL #4   Title Pt will be knowledgeable with advanced HEP for continued improvement post discharge    Time 7    Period Weeks    Status New    Target Date 12/06/20                  Plan - 10/18/20 1223    Clinical Impression Statement Pt is a pleasant 85 y/o F who presents to PT with reports of subacute neck pain and disocmfort. Physical findings are consistent with MD impressoin, as pt has palpable tenderness in posterior cervical musculature as well as postural deficits that increase strain on lower cervical spine. Her cervical AROM is limited, most significantly in rotation, limiting ability to perform some ADLs and drive with comfort. Due to past success with  therapy along with improvement in pain when performing HEP given by MD, pt would benefit form skilled PT services working towards improving periscapular strength and increasing cervical mobility.    Personal Factors and Comorbidities Age;Comorbidity 1;Fitness    Comorbidities HTN    Examination-Activity Limitations Stand;Stairs;Squat;Lift;Carry;Reach Overhead;Dressing    Examination-Participation Restrictions Yard Work;Shop;Driving;Community Activity    Stability/Clinical Decision Making Stable/Uncomplicated    Clinical Decision Making Low    Rehab Potential Excellent    PT Frequency 1x / week    PT Duration Other (comment)   7 weeks   PT Treatment/Interventions ADLs/Self Care Home Management;Electrical Stimulation;Moist Heat;Functional mobility training;Therapeutic activities;Therapeutic exercise;Neuromuscular re-education;Patient/family education;Manual techniques;Passive range of motion;Dry needling;Taping    PT Next Visit Plan assess response to HEP; progress periscapular strengthening and neck ROM exercises as able    PT Home Exercise Plan Access Code:  [email or web address]    Consulted and Agree with Plan of Care Patient           Patient will benefit from skilled therapeutic intervention in order to improve the following deficits and impairments:  Pain,Postural dysfunction,Improper body mechanics,Decreased range of motion,Decreased mobility,Decreased activity tolerance  Visit Diagnosis: Cervicalgia - Plan: PT plan of care cert/re-cert  Muscle weakness (generalized) - Plan: PT plan of care cert/re-cert     Problem List Patient Active Problem List   Diagnosis Date Noted  . Dizziness 09/28/2020  . Nasal septal deviation 11/26/2018  . Medicare annual wellness visit, subsequent 12/04/2017  . Coronary artery disease involving native coronary artery of native heart with unstable angina pectoris ([place])   . Spider varicose veins 07/22/2015  . Advanced care planning/counseling discussion  07/08/2015  . Mild cognitive impairment 01/07/2015  . Chronic radicular low back pain 01/02/2014  . Atrophic vaginitis 09/26/2013  . Pulmonary nodule, right 05/23/2013  . Acute neck pain 05/12/2011  . INSOMNIA, CHRONIC 01/14/2010  . Type 2 diabetes mellitus with circulatory disorder HTN ([place]) 11/11/2008  . MAMMOGRAM, ABNORMAL, LEFT 11/09/2008  . Chronic idiopathic constipation 09/11/2007  . OBESITY 05/31/2007  . Other allergic rhinitis 05/31/2007  . GERD 05/31/2007  . Osteoarthritis 05/31/2007  . URINARY INCONTINENCE 05/31/2007  . Hyperlipidemia associated with type 2 diabetes mellitus ([place]) 04/19/2007  . Hypertension associated with diabetes ([place]) 04/19/2007  . ACTINIC KERATOSIS 02/01/2007    Ward Chatters, PT, DPT 10/18/20 12:42 PM  [facility] [address] [place], [place], [address] Phone: [phone]   Fax:  [phone]  Name: Janet Chapman MRN: [id number] Date of Birth: 10/23/1935

## 2020-10-27 NOTE — Addendum Note (Signed)
Addended by: Carter Kitten on: 10/27/2020 10:12 AM   Modules accepted: Orders

## 2020-10-27 NOTE — Addendum Note (Signed)
Addended by: Carter Kitten on: 10/27/2020 10:13 AM   Modules accepted: Orders

## 2020-11-04 ENCOUNTER — Other Ambulatory Visit: Payer: Self-pay

## 2020-11-04 ENCOUNTER — Ambulatory Visit: Payer: PPO

## 2020-11-04 DIAGNOSIS — M542 Cervicalgia: Secondary | ICD-10-CM | POA: Diagnosis not present

## 2020-11-04 DIAGNOSIS — M6281 Muscle weakness (generalized): Secondary | ICD-10-CM

## 2020-11-04 NOTE — Therapy (Signed)
[place], [place], [address] Phone: [phone]   Fax:  [phone]  Physical Therapy Treatment  Patient Details  Name: Janet Chapman MRN: [id number] Date of Birth: 02-28-1936 Referring Provider (PT): Lesleigh Noe, MD   Encounter Date: 11/04/2020   PT End of Session - 11/04/20 1125     Visit Number 2    Number of Visits 6    Date for PT Re-Evaluation 12/06/20    Authorization Type [employer]    Progress Note Due on Visit 10    PT Start Time 1127    PT Stop Time 1202    PT Time Calculation (min) 35 min    Activity Tolerance Patient tolerated treatment well;No increased pain    Behavior During Therapy WFL for tasks assessed/performed             Past Medical History:  Diagnosis Date   Allergic rhinitis    Diabetes mellitus without complication (HCC)    diet controlled   GERD (gastroesophageal reflux disease)    Hyperlipidemia    Hypertension    Memory loss    Osteoarthritis    Urinary incontinence     Past Surgical History:  Procedure Laterality Date   ABDOMINAL HYSTERECTOMY  1993   TA   CARDIOVASCULAR STRESS TEST  2004   H. [name], cardiolite-normal   CHOLECYSTECTOMY  [id number]   COLONOSCOPY     LEFT HEART CATH AND CORONARY ANGIOGRAPHY N/A 11/06/2017   Procedure: LEFT HEART CATH AND CORONARY ANGIOGRAPHY;  Surgeon: Burnell Blanks, MD;  Location: [facility];  Service: Cardiovascular;  Laterality: N/A;    There were no vitals filed for this visit.   Subjective Assessment - 11/04/20 1125     Subjective Pt presents to PT with no current reports of neck pain. She states that she has not had pain in the last week. Pt    Currently in Pain? No/denies    Pain Score 0-No pain                [facility] PT Assessment - 11/04/20 0001       Observation/Other Assessments   Focus on Therapeutic Outcomes (FOTO)  78% function      AROM   Cervical - Right Rotation  45   slight discomfort   Cervical - Left Rotation 55                           [facility] Adult PT Treatment/Exercise - 11/04/20 0001       Neck Exercises: Machines for Strengthening   UBE (Upper Arm Bike) lvl 1.0 x 3 min fwd      Neck Exercises: Seated   Other Seated Exercise rows w/ blue tband 2x12    Other Seated Exercise scapular retraction x 15 - 3 sec hold      Neck Exercises: Supine   Neck Retraction 10 reps;5 secs    Neck Retraction Limitations x 2    Other Supine Exercise cervical rotation PROM with pt overpressure x 10 - 5 sec hold ea                    PT Education - 11/04/20 1207     Education Details HEP, POC    Person(s) Educated Patient    Methods Explanation;Demonstration;Handout    Comprehension Verbalized understanding;Returned demonstration  PT Short Term Goals - 11/04/20 1133       PT SHORT TERM GOAL #1   Title Pt will be compliant and knowledgeable with initial HEP    Baseline initial HEP given    Time 3    Period Weeks    Status Achieved    Target Date 11/08/20      PT SHORT TERM GOAL #2   Title Pt will self report neck pain no greater than 3/10 at worst in order to improve comfort and function    Baseline 5/10 at worst; update - 11/04/20 - 0/10 in last week    Time 3    Period Weeks    Status Achieved    Target Date 11/08/20               PT Long Term Goals - 11/04/20 1142       PT LONG TERM GOAL #1   Title Pt will improve FOTO score to no less than 69% function for improved confidence and functional ability    Baseline 62% function; update - goal met 78%    Time 7    Period Days    Status Achieved      PT LONG TERM GOAL #2   Title Pt will improve bilateral cervical rotation to no less than 50 degrees for improved ADL performance    Baseline see flowsheet    Time 7    Period Weeks    Status On-going      PT LONG TERM GOAL #3   Title Pt will self report neck pain no greater than 1/10  at worst in order to improve comfort and functional ability    Baseline 5/10 at worstl 11/04/20 - 0/10    Time 7    Period Weeks    Status Achieved      PT LONG TERM GOAL #4   Title Pt will be knowledgeable with advanced HEP for continued improvement post discharge    Time 7    Period Weeks    Status On-going                   Plan - 11/04/20 1207     Clinical Impression Statement Pt was able to complete prescribed exercises with no adverse effect. She has progressed well after initial evaluation, with improvement in FOTO score to 78%, meeting LTG while also improving bilateral cervical rotation. Due to decrease in neck pain along with pt request, PT will reduce treatment frequency and have pt perform HEP for a few weeks. If she continues to have no symptoms she will call and cancel last appointment with appropriate discharge at that time. HEP update to include cervical rotation stretch in supine.    PT Treatment/Interventions ADLs/Self Care Home Management;Electrical Stimulation;Moist Heat;Functional mobility training;Therapeutic activities;Therapeutic exercise;Neuromuscular re-education;Patient/family education;Manual techniques;Passive range of motion;Dry needling;Taping    PT Home Exercise Plan Access Code: [email or web address]    Consulted and Agree with Plan of Care Patient             Patient will benefit from skilled therapeutic intervention in order to improve the following deficits and impairments:  Pain, Postural dysfunction, Improper body mechanics, Decreased range of motion, Decreased mobility, Decreased activity tolerance  Visit Diagnosis: Cervicalgia  Muscle weakness (generalized)     Problem List Patient Active Problem List   Diagnosis Date Noted   Dizziness 09/28/2020   Nasal septal deviation 11/26/2018   Medicare annual wellness visit, subsequent 12/04/2017   Coronary artery  disease involving native coronary artery of native heart with unstable angina pectoris  ([place])    Spider varicose veins 07/22/2015   Advanced care planning/counseling discussion 07/08/2015   Mild cognitive impairment 01/07/2015   Chronic radicular low back pain 01/02/2014   Atrophic vaginitis 09/26/2013   Pulmonary nodule, right 05/23/2013   Acute neck pain 05/12/2011   INSOMNIA, CHRONIC 01/14/2010   Type 2 diabetes mellitus with circulatory disorder HTN ([place]) 11/11/2008   MAMMOGRAM, ABNORMAL, LEFT 11/09/2008   Chronic idiopathic constipation 09/11/2007   OBESITY 05/31/2007   Other allergic rhinitis 05/31/2007   GERD 05/31/2007   Osteoarthritis 05/31/2007   URINARY INCONTINENCE 05/31/2007   Hyperlipidemia associated with type 2 diabetes mellitus ([place]) 04/19/2007   Hypertension associated with diabetes ([place]) 04/19/2007   ACTINIC KERATOSIS 02/01/2007    Ward Chatters, PT, DPT 11/04/20 12:10 PM  [facility] [address] [place], [place], [address] Phone: [phone]   Fax:  [phone]  Name: Janet Chapman MRN: [id number] Date of Birth: 1935/08/04

## 2020-11-07 IMAGING — DX CHEST - 2 VIEW
2 series · 2 of 2 positions shown · non-contrast
Comparison: 11/02/2017

CLINICAL DATA: 82-year-old female with shortness of breath

EXAM:
CHEST - 2 VIEW

[chest pa]
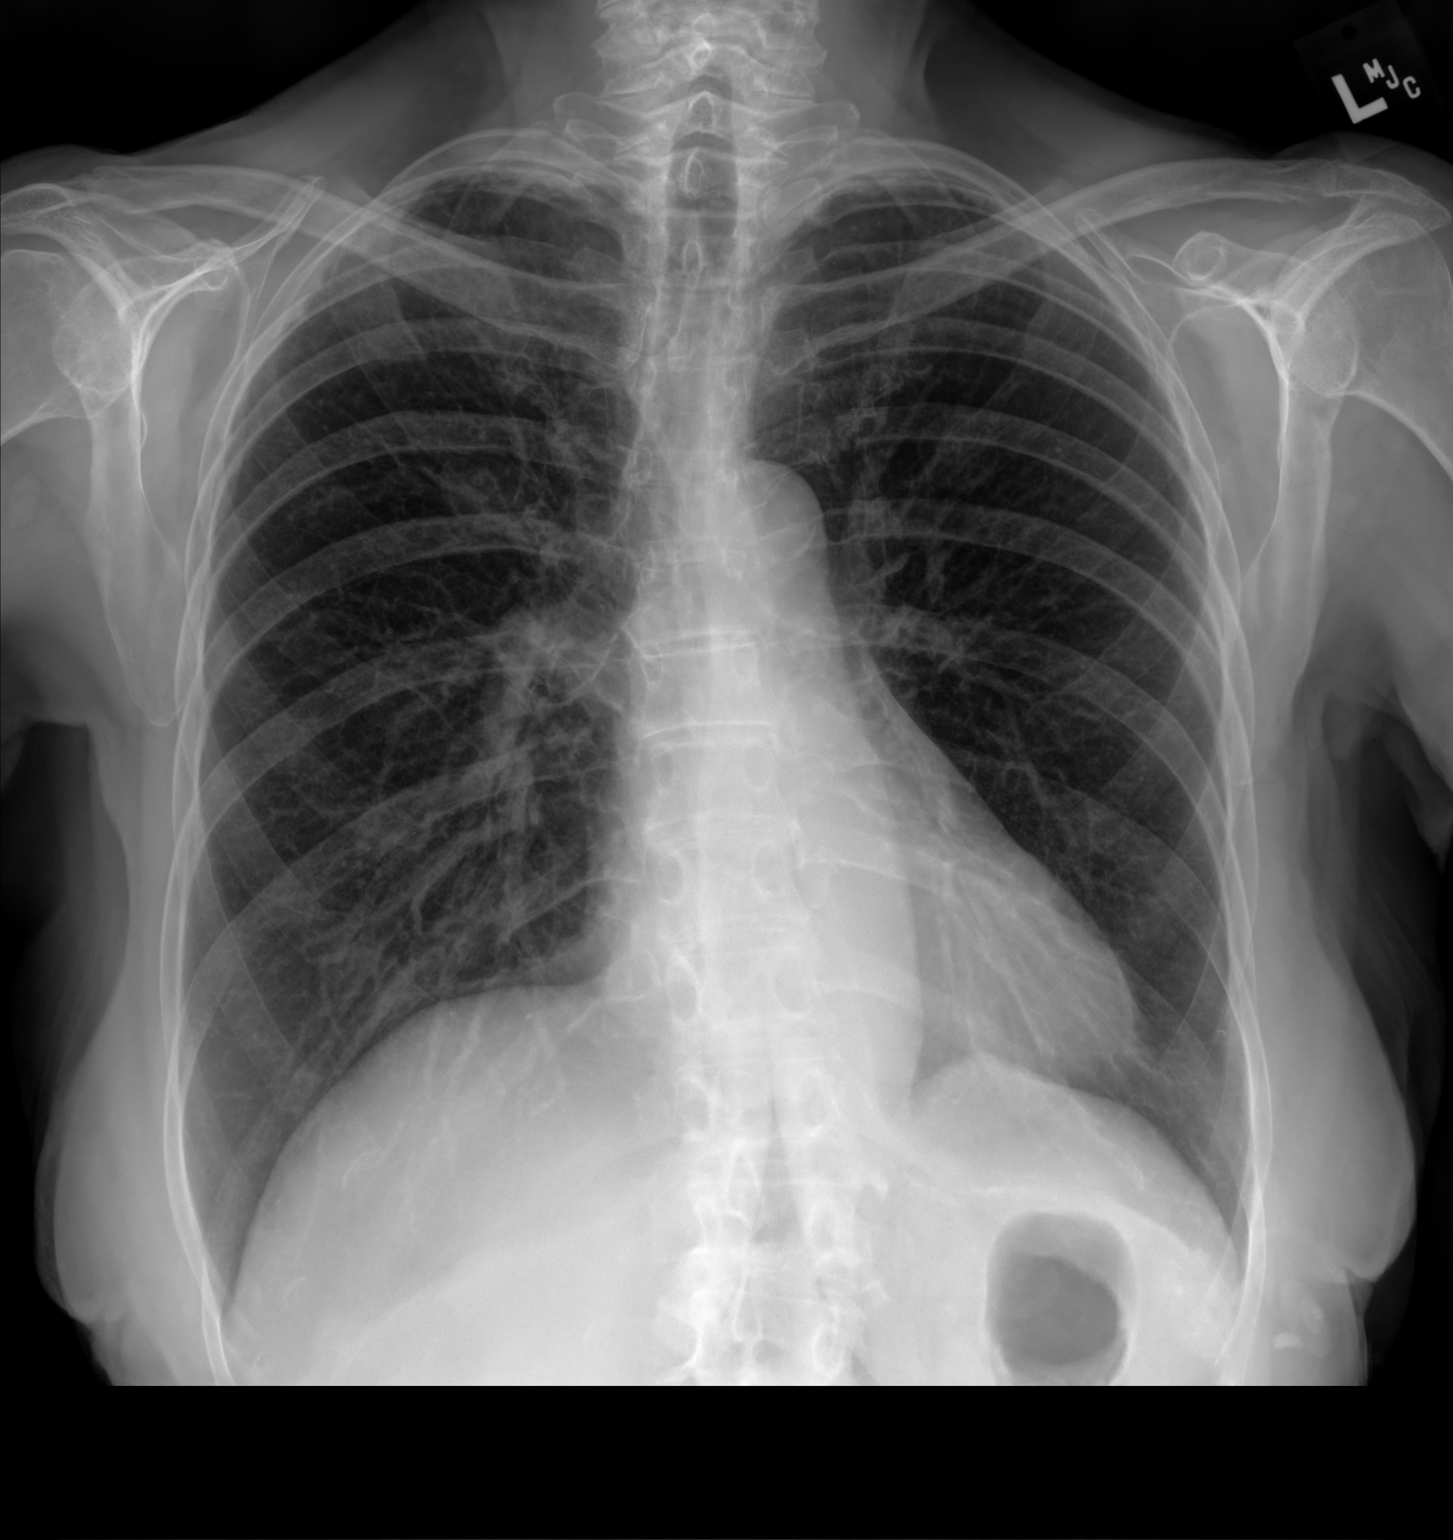

[chest lat]
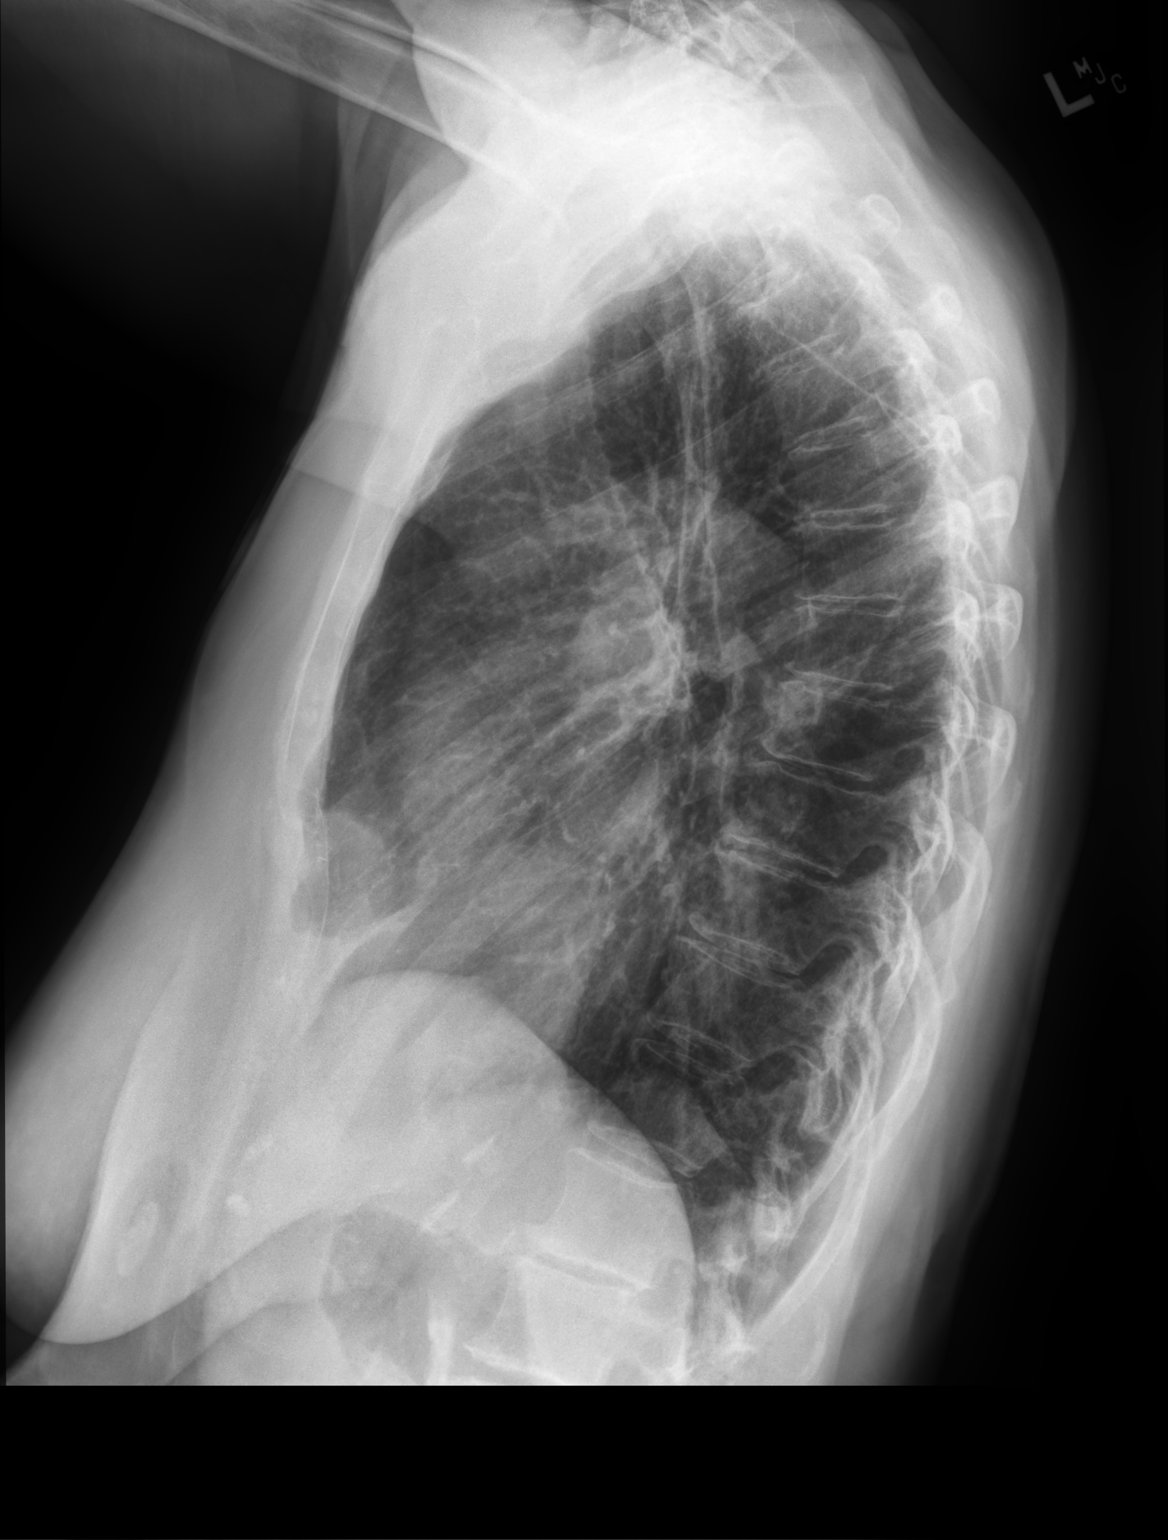

[2 of 2 positions shown; findings below may reference images not displayed]

FINDINGS: Cardiomediastinal silhouette unchanged in size and contour. No
evidence of central vascular congestion or interlobular septal
thickening. No pneumothorax or pleural effusion. No confluent
airspace disease. Degenerative changes of the spine. No displaced
fracture.
IMPRESSION: Negative for acute cardiopulmonary disease

## 2020-12-01 ENCOUNTER — Other Ambulatory Visit: Payer: Self-pay | Admitting: Family Medicine

## 2020-12-01 MED ORDER — LOSARTAN POTASSIUM-HCTZ 100-25 MG PO TABS: 0.5000 | ORAL_TABLET | Freq: Every day | ORAL | 1 refills | Status: DC

## 2020-12-01 NOTE — Telephone Encounter (Signed)
Left message for [name] return call to clarify how she is taking her Metoprolol.

## 2020-12-01 NOTE — Telephone Encounter (Signed)
Spoke with Mrs. Kempe.  She confirmed she is only taking 1/2 tablet daily of her Metoprolol.

## 2020-12-02 ENCOUNTER — Other Ambulatory Visit: Payer: Self-pay

## 2020-12-02 ENCOUNTER — Ambulatory Visit: Payer: PPO | Attending: Family Medicine

## 2020-12-02 DIAGNOSIS — M6281 Muscle weakness (generalized): Secondary | ICD-10-CM | POA: Diagnosis not present

## 2020-12-02 DIAGNOSIS — M542 Cervicalgia: Secondary | ICD-10-CM | POA: Insufficient documentation

## 2020-12-02 NOTE — Therapy (Addendum)
[place], [place], [address] Phone: [phone]   Fax:  [phone]  Physical Therapy Treatment/Discharge  Patient Details  Name: Janet Chapman MRN: [id number] Date of Birth: Mar 14, 1936 Referring Provider (PT): Lesleigh Noe, MD   Encounter Date: 12/02/2020   PT End of Session - 12/02/20 1130     Visit Number 3    Number of Visits 6    Date for PT Re-Evaluation 12/06/20    Authorization Type [employer]    Progress Note Due on Visit 10    PT Start Time 1130    PT Stop Time 1153    PT Time Calculation (min) 23 min    Activity Tolerance Patient tolerated treatment well;No increased pain    Behavior During Therapy WFL for tasks assessed/performed             Past Medical History:  Diagnosis Date   Allergic rhinitis    Diabetes mellitus without complication (HCC)    diet controlled   GERD (gastroesophageal reflux disease)    Hyperlipidemia    Hypertension    Memory loss    Osteoarthritis    Urinary incontinence     Past Surgical History:  Procedure Laterality Date   ABDOMINAL HYSTERECTOMY  1993   TA   CARDIOVASCULAR STRESS TEST  2004   H. [name], cardiolite-normal   CHOLECYSTECTOMY  [id number]'[id number]   COLONOSCOPY     LEFT HEART CATH AND CORONARY ANGIOGRAPHY N/A 11/06/2017   Procedure: LEFT HEART CATH AND CORONARY ANGIOGRAPHY;  Surgeon: Burnell Blanks, MD;  Location: [facility];  Service: Cardiovascular;  Laterality: N/A;    There were no vitals filed for this visit.   Subjective Assessment - 12/02/20 1131     Subjective Pt presents to PT with no reports of neck pain or discomfort. She has been fairly compliant with HEP with no adverse effect. She is ready to begin PT discharge assessment at this time,    Currently in Pain? No/denies    Pain Score 0-No pain                [facility] PT Assessment - 12/02/20 0001       AROM   Cervical - Right Rotation 60     Cervical - Left Rotation 65                           [facility] Adult PT Treatment/Exercise - 12/02/20 0001       Neck Exercises: Seated   Other Seated Exercise rows w/ blue tband 3x12    Other Seated Exercise scapular retraction x 10 - 3 sec hold      Neck Exercises: Supine   Neck Retraction Limitations 2x10 0- 5 sec hold    Other Supine Exercise cervical rotation AROM 2x10 - 5 sec hold      Neck Exercises: Stabilization   Stabilization ext SNAG w/ towel x 10                    PT Education - 12/02/20 1158     Education Details HEP    Person(s) Educated Patient    Methods Explanation;Demonstration;Handout    Comprehension Verbalized understanding;Returned demonstration              PT Short Term Goals - 11/04/20 1133       PT SHORT TERM GOAL #1   Title Pt will be compliant  and knowledgeable with initial HEP    Baseline initial HEP given    Time 3    Period Weeks    Status Achieved    Target Date 11/08/20      PT SHORT TERM GOAL #2   Title Pt will self report neck pain no greater than 3/10 at worst in order to improve comfort and function    Baseline 5/10 at worst; update - 11/04/20 - 0/10 in last week    Time 3    Period Weeks    Status Achieved    Target Date 11/08/20               PT Long Term Goals - 12/02/20 1156       PT LONG TERM GOAL #1   Title Pt will improve FOTO score to no less than 69% function for improved confidence and functional ability    Baseline 62% function; update - goal met 78%    Time 7    Period Days    Status Achieved      PT LONG TERM GOAL #2   Title Pt will improve bilateral cervical rotation to no less than 50 degrees for improved ADL performance    Baseline see flowsheet    Time 7    Period Weeks    Status Achieved      PT LONG TERM GOAL #3   Title Pt will self report neck pain no greater than 1/10 at worst in order to improve comfort and functional ability    Baseline 5/10 at worstl  11/04/20 - 0/10    Time 7    Period Weeks    Status Achieved      PT LONG TERM GOAL #4   Title Pt will be knowledgeable with advanced HEP for continued improvement post discharge    Time 7    Period Weeks    Status Achieved                   Plan - 12/02/20 1156     Clinical Impression Statement Pt was able to demonstrate understanding of HEP with no adverse effect. She has not had any cervical pain since last treatment session and was able to increase cervical AROM for rotation in both directions, meeting all LTGs. She has progressed well with therapy and no longer requires skilled PT services. She should continue to maintain improvement with HEP compliance and is being discharged from therapy at this time.    PT Treatment/Interventions ADLs/Self Care Home Management;Electrical Stimulation;Moist Heat;Functional mobility training;Therapeutic activities;Therapeutic exercise;Neuromuscular re-education;Patient/family education;Manual techniques;Passive range of motion;Dry needling;Taping    PT Home Exercise Plan Access Code: [email or web address]    Consulted and Agree with Plan of Care Patient             Patient will benefit from skilled therapeutic intervention in order to improve the following deficits and impairments:  Pain, Postural dysfunction, Improper body mechanics, Decreased range of motion, Decreased mobility, Decreased activity tolerance  Visit Diagnosis: Cervicalgia  Muscle weakness (generalized)     Problem List Patient Active Problem List   Diagnosis Date Noted   Dizziness 09/28/2020   Nasal septal deviation 11/26/2018   Medicare annual wellness visit, subsequent 12/04/2017   Coronary artery disease involving native coronary artery of native heart with unstable angina pectoris ([place])    Spider varicose veins 07/22/2015   Advanced care planning/counseling discussion 07/08/2015   Mild cognitive impairment 01/07/2015   Chronic radicular low back pain 01/02/2014  Atrophic vaginitis 09/26/2013   Pulmonary nodule, right 05/23/2013   Acute neck pain 05/12/2011   INSOMNIA, CHRONIC 01/14/2010   Type 2 diabetes mellitus with circulatory disorder HTN ([place]) 11/11/2008   MAMMOGRAM, ABNORMAL, LEFT 11/09/2008   Chronic idiopathic constipation 09/11/2007   OBESITY 05/31/2007   Other allergic rhinitis 05/31/2007   GERD 05/31/2007   Osteoarthritis 05/31/2007   URINARY INCONTINENCE 05/31/2007   Hyperlipidemia associated with type 2 diabetes mellitus ([place]) 04/19/2007   Hypertension associated with diabetes ([place]) 04/19/2007   ACTINIC KERATOSIS 02/01/2007    Ward Chatters, PT, DPT 12/02/20 12:01 PM  [facility] [place], [place], [address] Phone: [phone]   Fax:  [phone]  Name: LISAANNE LAWRIE MRN: [id number] Date of Birth: 1935-06-28  PHYSICAL THERAPY DISCHARGE SUMMARY  Visits from Start of Care: 3  Current functional level related to goals / functional outcomes: See goals and objective   Remaining deficits: See goals and objective   Education / Equipment: See goals and objective   Patient agrees to discharge. Patient goals were met. Patient is being discharged due to meeting the stated rehab goals.

## 2020-12-15 ENCOUNTER — Other Ambulatory Visit: Payer: Self-pay

## 2020-12-15 ENCOUNTER — Ambulatory Visit: Payer: PPO | Admitting: Adult Health

## 2020-12-15 VITALS — BP 146/74 | HR 50 | Ht 61.0 in | Wt 133.0 lb

## 2020-12-15 DIAGNOSIS — G3184 Mild cognitive impairment, so stated: Secondary | ICD-10-CM | POA: Diagnosis not present

## 2020-12-15 NOTE — Progress Notes (Signed)
I have read the note, and I agree with the clinical assessment and plan.  Yee Gangi K Rickard Kennerly   

## 2020-12-15 NOTE — Patient Instructions (Signed)
Your Plan:  Continue to monitor memory Memory score is stable If your symptoms worsen or you develop new symptoms please let us know.   Thank you for coming to see [place] at [facility]. I hope we have been able to provide you high quality care today.  You may receive a patient satisfaction survey over the next few weeks. We would appreciate your feedback and comments so that we may continue to improve ourselves and the health of our patients.

## 2020-12-15 NOTE — Progress Notes (Signed)
PATIENT: Janet Chapman DOB: 11-Sep-1935  REASON FOR VISIT: follow up HISTORY FROM: patient PRIMARY NEUROLOGIST: Dr. Henderson [place] NP  HISTORY OF PRESENT ILLNESS: Today 12/15/20:  Janet Chapman is an 85 year old female with a history of memory disturbance.  She returns today for follow-up.  The patient has tried multiple medications in the past but has not been able to tolerate them.  These medications include Exelon, Aricept and Namenda.  She feels that her memory has remained stable.  She lives at home with her husband.  She is able to complete all ADLs independently.  She continues to complete all household chores.  She is able to drive to familiar places.  Denies any trouble sleeping.  She manages her own medications and appointments.  She also manages the finances without difficulty.  She returns today for an evaluation.  HISTORY 06/24/20 ( Copied from Butler Denmark, NP note): Janet Chapman is an 85 year old female with history of memory problems. Could not tolerate Exelon patch due to blisters, the capsules caused rash. MMSE 23/30 today. Most trouble with short term memory, has to write things down. She drives, does the grocery shopping. Keeps the household. Currently on Namenda 5/10 mg daily, is going to increase up to 10 mg twice daily, going slowly due to dizziness with each step up. She will repeat herself, say the same things to her husband. They have no children. Family is spread out in [place]. She enjoys house cleaning, watching TV, shopping at [facility], going back to church. Here with husband. She takes Prevagen from time to time, is on it now.   HISTORY 01/21/2020 SS: Janet Chapman is an 85 year old female with history of memory problems, CT head was normal for age.  When last seen, she developed a rash to her feet, we stopped Exelon capsules, the rash cleared.  It is almost completely gone.  With Exelon patches, she had blisters.  Is here requesting to start another medication for memory.   Her husband notices a difference in her memory and her mood on medication.  She is calmer and has a better attitude.  She takes Prevagen from time to time.  Presents today for evaluation accompanied by her husband.   REVIEW OF SYSTEMS: Out of a complete 14 system review of symptoms, the patient complains only of the following symptoms, and all other reviewed systems are negative.  ALLERGIES: Allergies  Allergen Reactions   Codeine Nausea Only    HOME MEDICATIONS: Outpatient Medications Prior to Visit  Medication Sig Dispense Refill   Apoaequorin (PREVAGEN PO) Take by mouth.     aspirin 81 MG tablet Take 81 mg by mouth at bedtime.     losartan-hydrochlorothiazide (HYZAAR) 100-25 MG tablet Take 0.5 tablets by mouth daily. 45 tablet 1   metoprolol succinate (TOPROL-XL) 50 MG 24 hr tablet Take 0.5 tablets (25 mg total) by mouth daily. 45 tablet 1   Multiple Vitamins-Minerals (HAIR/SKIN/NAILS/BIOTIN PO) Take 2 each by mouth daily.     No facility-administered medications prior to visit.    PAST MEDICAL HISTORY: Past Medical History:  Diagnosis Date   Allergic rhinitis    Diabetes mellitus without complication (HCC)    diet controlled   GERD (gastroesophageal reflux disease)    Hyperlipidemia    Hypertension    Memory loss    Osteoarthritis    Urinary incontinence     PAST SURGICAL HISTORY: Past Surgical History:  Procedure Laterality Date   ABDOMINAL HYSTERECTOMY  1993   TA  CARDIOVASCULAR STRESS TEST  2004   H. Tamala Janet Chapman, cardiolite-normal   CHOLECYSTECTOMY  [id number]   COLONOSCOPY     LEFT HEART CATH AND CORONARY ANGIOGRAPHY N/A 11/06/2017   Procedure: LEFT HEART CATH AND CORONARY ANGIOGRAPHY;  Surgeon: Burnell Blanks, MD;  Location: [facility];  Service: Cardiovascular;  Laterality: N/A;    FAMILY HISTORY: Family History  Problem Relation Age of Onset   Heart attack Father 40   Dementia Mother    Stroke Mother 61   Ovarian cancer Sister 30   Diabetes  Other        Maternal side   Ovarian cancer Other        Grandmother   Ovarian cancer Other        aunt   Colon cancer Neg Hx     SOCIAL HISTORY: Social History   Socioeconomic History   Marital status: Married    Spouse name: Not on file   Number of children: 0   Years of education: Not on file   Highest education level: Not on file  Occupational History   Occupation: retired  Tobacco Use   Smoking status: Former   Smokeless tobacco: Never   Tobacco comments:    In college  Vaping Use   Vaping Use: Never used  Substance and Sexual Activity   Alcohol use: Yes    Comment: 1 glass of wine per month   Drug use: No   Sexual activity: Never  Other Topics Concern   Not on file  Social History Narrative    Regular exercise: yes, Curves 2x a week   Married 48 + years    Diet: fruit and veggies, no FF, some water, artificial sweetener   HCPOA: [name], has living will, full code ( reviewed 2014)            Social Determinants of Health   Financial Resource Strain: Not on file  Food Insecurity: Not on file  Transportation Needs: Not on file  Physical Activity: Not on file  Stress: Not on file  Social Connections: Not on file  Intimate Partner Violence: Not on file      PHYSICAL EXAM  Vitals:   12/15/20 1116  BP: (!) 146/74  Pulse: (!) 50  Weight: 133 lb (60.3 kg)  Height: '5\' 1"'$  (1.549 m)   Body mass index is 25.13 kg/m.  MMSE - Mini Mental State Exam 12/15/2020 06/24/2020 12/22/2019  Orientation to time '5 4 5  '$ Orientation to Place '5 5 5  '$ Registration '3 3 3  '$ Attention/ Calculation '5 1 5  '$ Attention/Calculation-comments spelled world - -  Recall 0 1 1  Language- name 2 objects '2 2 2  '$ Language- repeat '1 1 1  '$ Language- follow 3 step command '3 3 3  '$ Language- read & follow direction '1 1 1  '$ Write a sentence '1 1 1  '$ Copy design 0 1 1  Total score '26 23 28      '$ Generalized: Well developed, in no acute distress   Neurological examination  Mentation:  Alert oriented to time, place, history taking. Follows all commands speech and language fluent Cranial nerve II-XII: Pupils were equal round reactive to light. Extraocular movements were full, visual field were full on confrontational test. Facial sensation and strength were normal. Uvula tongue midline. Head turning and shoulder shrug  were normal and symmetric. Motor: The motor testing reveals 5 over 5 strength of all 4 extremities. Good symmetric motor tone is noted throughout.  Sensory: Sensory testing is intact to soft touch on all 4 extremities. No evidence of extinction is noted.  Coordination: Cerebellar testing reveals good finger-nose-finger and heel-to-shin bilaterally.  Gait and station: Gait is normal.  Reflexes: Deep tendon reflexes are symmetric and normal bilaterally.   DIAGNOSTIC DATA (LABS, IMAGING, TESTING) - I reviewed patient records, labs, notes, testing and imaging myself where available.  Lab Results  Component Value Date   WBC 7.2 09/28/2020   HGB 14.3 09/28/2020   HCT 41.3 09/28/2020   MCV 93.3 09/28/2020   PLT 247.0 09/28/2020      Component Value Date/Time   NA 137 09/28/2020 1132   NA 138 09/04/2018 1537   K 3.8 09/28/2020 1132   CL 93 (L) 09/28/2020 1132   CO2 36 (H) 09/28/2020 1132   GLUCOSE 113 (H) 09/28/2020 1132   BUN 22 09/28/2020 1132   BUN 20 09/04/2018 1537   CREATININE 0.85 09/28/2020 1132   CALCIUM 10.1 09/28/2020 1132   PROT 6.9 09/28/2020 1132   ALBUMIN 4.5 09/28/2020 1132   AST 12 09/28/2020 1132   ALT 9 09/28/2020 1132   ALKPHOS 60 09/28/2020 1132   BILITOT 1.0 09/28/2020 1132   GFRNONAA 63 09/04/2018 1537   GFRAA 73 09/04/2018 1537   Lab Results  Component Value Date   CHOL 177 07/08/2020   HDL 78.90 07/08/2020   LDLCALC 80 07/08/2020   LDLDIRECT 119.8 04/26/2009   TRIG 91.0 07/08/2020   CHOLHDL 2 07/08/2020   Lab Results  Component Value Date   HGBA1C 6.2 07/08/2020   Lab Results  Component Value Date   VITAMINB12  461 06/18/2019   Lab Results  Component Value Date   TSH 3.92 12/18/2018      ASSESSMENT AND PLAN 85 y.o. year old female  has a past medical history of Allergic rhinitis, Diabetes mellitus without complication ([place]), GERD (gastroesophageal reflux disease), Hyperlipidemia, Hypertension, Memory loss, Osteoarthritis, and Urinary incontinence. here with :  1.  Memory disturbance  MMSE is stable 26 out of 30 previously 23 out of 30 Unable to tolerate Aricept, Exelon and Namenda We will continue to monitor memory for now Follow-up in 6 months or sooner if needed   I spent 30 minutes of face-to-face and non-face-to-face time with patient.  This included previsit chart review, discussing medication options, memory score and diagnosis   Ward Givens, MSN, NP-C 12/15/2020, 11:07 AM [facility] [address], [place], [place] [address] [phone]

## 2020-12-23 ENCOUNTER — Ambulatory Visit: Payer: PPO | Admitting: Neurology

## 2021-02-24 ENCOUNTER — Ambulatory Visit (INDEPENDENT_AMBULATORY_CARE_PROVIDER_SITE_OTHER): Payer: PPO | Admitting: Family Medicine

## 2021-02-24 ENCOUNTER — Other Ambulatory Visit: Payer: Self-pay

## 2021-02-24 ENCOUNTER — Encounter: Payer: Self-pay | Admitting: Family Medicine

## 2021-02-24 VITALS — BP 122/78 | HR 98 | Temp 98.0°F | Ht 62.0 in | Wt 143.0 lb

## 2021-02-24 DIAGNOSIS — L821 Other seborrheic keratosis: Secondary | ICD-10-CM

## 2021-02-24 DIAGNOSIS — L84 Corns and callosities: Secondary | ICD-10-CM | POA: Diagnosis not present

## 2021-02-24 DIAGNOSIS — R6889 Other general symptoms and signs: Secondary | ICD-10-CM | POA: Diagnosis not present

## 2021-02-24 DIAGNOSIS — L239 Allergic contact dermatitis, unspecified cause: Secondary | ICD-10-CM

## 2021-02-24 DIAGNOSIS — L219 Seborrheic dermatitis, unspecified: Secondary | ICD-10-CM | POA: Diagnosis not present

## 2021-02-24 MED ORDER — KETOCONAZOLE 2 % EX SHAM: 1.0000 "application " | MEDICATED_SHAMPOO | CUTANEOUS | 0 refills | Status: DC

## 2021-02-24 NOTE — Assessment & Plan Note (Signed)
Acute, recommend medicated corn pads or donut pads to relieve pressure on that area of foot.

## 2021-02-24 NOTE — Assessment & Plan Note (Signed)
Acute   eval for thyroid disorder with TSH.

## 2021-02-24 NOTE — Patient Instructions (Addendum)
Please stop at the lab to have labs drawn.  Area on scalp and back and torso: they are seborrheic keratoses.  Wear corn callus pads on lesion on left foot.  Can use cortisone cream ( Cortisone 10 OTC) on  itching on arms twice daily as needed for 2 weeks.  Start weekly ketoconazole shampoo for scalp itching.

## 2021-02-24 NOTE — Assessment & Plan Note (Signed)
Not irritated, pt reassured.

## 2021-02-24 NOTE — Progress Notes (Signed)
Patient ID: Janet Chapman, female    DOB: 07/23/1935, 85 y.o.   MRN: [id number]  This visit was conducted in person.  BP 122/78 (BP Location: Left Arm, Patient Position: Sitting, Cuff Size: Normal)   Pulse 98   Temp 98 F (36.7 C)   Ht 5\' 2"  (1.575 m)   Wt 143 lb (64.9 kg)   SpO2 (!) 59%   BMI 26.16 kg/m    CC: Chief Complaint  Patient presents with   Foot Pain    Left foot has a knot on the bottom   Itching   Nose Gets Cold   Look At [facility] on Top of Head    Subjective:   HPI: Janet Chapman is a 85 y.o. female presenting on 02/24/2021 for Foot Pain (Left foot has a knot on the bottom), Itching, Nose Gets Cold, Look At [facility], and Bump on Top of Head  1.left foot pain, knot on sole of left foot. She has noted the area present for 3-4 months. Area is sore.  She has  not treated the area with anything.   2. Cold nose x 1 year,  no cold intolerance.. last TSH nml in 2020  No runny nose.  She has cold hands as well.  3. Skin check  She has noted a knot on her scalp.Marland Kitchen seems to decreased size.  Scalp is itchy and dry skin washes every other week.  Area is not painful.  She  has had new moles on back, some increase in size.    MMSE is stable 26 out of 30 previously 23 out of 30 Unable to tolerate Aricept, Exelon and Namenda  Relevant past medical, surgical, family and social history reviewed and updated as indicated. Interim medical history since our last visit reviewed. Allergies and medications reviewed and updated. Outpatient Medications Prior to Visit  Medication Sig Dispense Refill   aspirin 81 MG tablet Take 81 mg by mouth at bedtime.     losartan-hydrochlorothiazide (HYZAAR) 100-25 MG tablet Take 0.5 tablets by mouth daily. 45 tablet 1   metoprolol succinate (TOPROL-XL) 50 MG 24 hr tablet Take 0.5 tablets (25 mg total) by mouth daily. 45 tablet 1   Apoaequorin (PREVAGEN PO) Take by mouth.     Multiple Vitamins-Minerals (HAIR/SKIN/NAILS/BIOTIN PO) Take  2 each by mouth daily.     No facility-administered medications prior to visit.     Per HPI unless specifically indicated in ROS section below Review of Systems  Constitutional:  Negative for fatigue and fever.  HENT:  Negative for ear pain.   Eyes:  Negative for pain.  Respiratory:  Negative for chest tightness and shortness of breath.   Cardiovascular:  Negative for chest pain, palpitations and leg swelling.  Gastrointestinal:  Negative for abdominal pain.  Genitourinary:  Negative for dysuria.  Objective:  BP 122/78 (BP Location: Left Arm, Patient Position: Sitting, Cuff Size: Normal)   Pulse 98   Temp 98 F (36.7 C)   Ht 5\' 2"  (1.575 m)   Wt 143 lb (64.9 kg)   SpO2 (!) 59%   BMI 26.16 kg/m   Wt Readings from Last 3 Encounters:  02/24/21 143 lb (64.9 kg)  12/15/20 133 lb (60.3 kg)  09/28/20 142 lb 8 oz (64.6 kg)      Physical Exam Constitutional:      General: She is not in acute distress.    Appearance: Normal appearance. She is well-developed. She is not ill-appearing or toxic-appearing.  HENT:     Head: Normocephalic.     Right Ear: Hearing, tympanic membrane, ear canal and external ear normal. Tympanic membrane is not erythematous, retracted or bulging.     Left Ear: Hearing, tympanic membrane, ear canal and external ear normal. Tympanic membrane is not erythematous, retracted or bulging.     Nose: No mucosal edema or rhinorrhea.     Right Sinus: No maxillary sinus tenderness or frontal sinus tenderness.     Left Sinus: No maxillary sinus tenderness or frontal sinus tenderness.     Mouth/Throat:     Pharynx: Uvula midline.  Eyes:     General: Lids are normal. Lids are everted, no foreign bodies appreciated.     Conjunctiva/sclera: Conjunctivae normal.     Pupils: Pupils are equal, round, and reactive to light.  Neck:     Thyroid: No thyroid mass or thyromegaly.     Vascular: No carotid bruit.     Trachea: Trachea normal.  Cardiovascular:     Rate and Rhythm:  Normal rate and regular rhythm.     Pulses: Normal pulses.     Heart sounds: Normal heart sounds, S1 normal and S2 normal. No murmur heard.   No friction rub. No gallop.  Pulmonary:     Effort: Pulmonary effort is normal. No tachypnea or respiratory distress.     Breath sounds: Normal breath sounds. No decreased breath sounds, wheezing, rhonchi or rales.  Abdominal:     General: Bowel sounds are normal.     Palpations: Abdomen is soft.     Tenderness: There is no abdominal tenderness.  Musculoskeletal:     Cervical back: Normal range of motion and neck supple.  Skin:    General: Skin is warm and dry.     Findings: No rash.     Comments: SK on scalp and many on back and torso   Left lateral foot with callus at MCP  Slight redness on anterior forearms. Dry flaky scalp  Neurological:     Mental Status: She is alert.  Psychiatric:        Mood and Affect: Mood is not anxious or depressed.        Speech: Speech normal.        Behavior: Behavior normal. Behavior is cooperative.        Thought Content: Thought content normal.        Judgment: Judgment normal.      Results for orders placed or performed in visit on 09/28/20  Comprehensive metabolic panel  Result Value Ref Range   Sodium 137 135 - 145 mEq/L   Potassium 3.8 3.5 - 5.1 mEq/L   Chloride 93 (L) 96 - 112 mEq/L   CO2 36 (H) 19 - 32 mEq/L   Glucose, Bld 113 (H) 70 - 99 mg/dL   BUN 22 6 - 23 mg/dL   Creatinine, Ser 0.85 0.40 - 1.20 mg/dL   Total Bilirubin 1.0 0.2 - 1.2 mg/dL   Alkaline Phosphatase 60 39 - 117 U/L   AST 12 0 - 37 U/L   ALT 9 0 - 35 U/L   Total Protein 6.9 6.0 - 8.3 g/dL   Albumin 4.5 3.5 - 5.2 g/dL   GFR 63.64 >60.00 mL/min   Calcium 10.1 8.4 - 10.5 mg/dL  CBC  Result Value Ref Range   WBC 7.2 4.0 - 10.5 K/uL   RBC 4.42 3.87 - 5.11 Mil/uL   Platelets 247.0 150.0 - 400.0 K/uL   Hemoglobin 14.3 12.0 -  15.0 g/dL   HCT 41.3 36.0 - 46.0 %   MCV 93.3 78.0 - 100.0 fl   MCHC 34.6 30.0 - 36.0 g/dL   RDW  14.1 11.5 - 15.5 %    This visit occurred during the SARS-CoV-2 public health emergency.  Safety protocols were in place, including screening questions prior to the visit, additional usage of staff PPE, and extensive cleaning of exam room while observing appropriate contact time as indicated for disinfecting solutions.   COVID 19 screen:  No recent travel or known exposure to COVID19 The patient denies respiratory symptoms of COVID 19 at this time. The importance of social distancing was discussed today.   Assessment and Plan    Problem List Items Addressed This Visit     Allergic dermatitis   Cold intolerance - Primary    Acute   eval for thyroid disorder with TSH.      Relevant Orders   TSH   Foot callus    Acute, recommend medicated corn pads or donut pads to relieve pressure on that area of foot.      Seborrheic dermatitis of scalp    Acute   Will try trial of weekly ketoconazole shampoo.      Seborrheic keratosis of scalp    Not irritated, pt reassured.      Meds ordered this encounter  Medications   ketoconazole (NIZORAL) 2 % shampoo    Sig: Apply 1 application topically once a week.    Dispense:  120 mL    Refill:  0   Orders Placed This Encounter  Procedures   TSH     [name], MD

## 2021-02-24 NOTE — Assessment & Plan Note (Signed)
Acute   Will try trial of weekly ketoconazole shampoo.

## 2021-02-25 LAB — TSH: TSH: 2.65 u[IU]/mL (ref 0.35–5.50)

## 2021-03-14 NOTE — Progress Notes (Addendum)
I reviewed health advisor's note, was available for consultation, and agree with documentation and plan.  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 female who presents for Medicare Annual (Subsequent) preventive examination.  I connected with Janet Chapman today by telephone and verified that I am speaking with the correct person using two identifiers. Location patient: home Location provider: work Persons participating in the virtual visit: patient, Marine scientist.    I discussed the limitations, risks, security and privacy concerns of performing an evaluation and management service by telephone and the availability of in person appointments. I also discussed with the patient that there may be a patient responsible charge related to this service. The patient expressed understanding and verbally consented to this telephonic visit.    Interactive audio and video telecommunications were attempted between this provider and patient, however failed, due to patient having technical difficulties OR patient did not have access to video capability.  We continued and completed visit with audio only.  Some vital signs may be absent or patient reported.   Time Spent with patient on telephone encounter: 25 minutes  Review of Systems     Cardiac Risk Factors include: advanced age (>19men, >70 women);diabetes mellitus;dyslipidemia;hypertension     Objective:    Today's Vitals   03/18/21 0859  Weight: 143 lb (64.9 kg)  Height: 5\' 2"  (1.575 m)   Body mass index is 26.16 kg/m.  Advanced Directives 03/18/2021 10/18/2020 12/04/2017 11/06/2017 11/27/2016 12/02/2015  Does Patient Have a Medical Advance Directive? No No Yes Yes Yes No;Yes  Type of Advance Directive - - [place];Living will [place];Living will [place];Living will Living will  Does patient want to make changes to medical advance  directive? - - - No - Patient declined - -  Copy of [place] in Chart? - - No - copy requested No - copy requested No - copy requested No - copy requested  Would patient like information on creating a medical advance directive? Yes (MAU/Ambulatory/Procedural Areas - Information given) Yes (MAU/Ambulatory/Procedural Areas - Information given) - - - No - patient declined information    Current Medications (verified) Outpatient Encounter Medications as of 03/18/2021  Medication Sig   aspirin 81 MG tablet Take 81 mg by mouth at bedtime.   losartan-hydrochlorothiazide (HYZAAR) 100-25 MG tablet Take 0.5 tablets by mouth daily.   metoprolol succinate (TOPROL-XL) 50 MG 24 hr tablet Take 0.5 tablets (25 mg total) by mouth daily.   ketoconazole (NIZORAL) 2 % shampoo Apply 1 application topically once a week. (Patient not taking: Reported on 03/18/2021)   No facility-administered encounter medications on file as of 03/18/2021.    Allergies (verified) Codeine   History: Past Medical History:  Diagnosis Date   Allergic rhinitis    Diabetes mellitus without complication (HCC)    diet controlled   GERD (gastroesophageal reflux disease)    Hyperlipidemia    Hypertension    Memory loss    Osteoarthritis    Urinary incontinence    Past Surgical History:  Procedure Laterality Date   ABDOMINAL HYSTERECTOMY  1993   TA   CARDIOVASCULAR STRESS TEST  2004   H. [name], cardiolite-normal   CHOLECYSTECTOMY  1610'R   COLONOSCOPY     LEFT HEART CATH AND CORONARY ANGIOGRAPHY N/A 11/06/2017   Procedure: LEFT HEART CATH AND CORONARY ANGIOGRAPHY;  Surgeon: Burnell Blanks, MD;  Location: [facility]  INVASIVE CV LAB;  Service: Cardiovascular;  Laterality: N/A;   Family History  Problem Relation Age of Onset   Heart attack Father 44   Dementia Mother    Stroke Mother 13   Ovarian cancer Sister 2   Diabetes Other        Maternal side   Ovarian cancer Other        Grandmother   Ovarian  cancer Other        aunt   Colon cancer Neg Hx    Social History   Socioeconomic History   Marital status: Married    Spouse name: Not on file   Number of children: 0   Years of education: Not on file   Highest education level: Not on file  Occupational History   Occupation: retired  Tobacco Use   Smoking status: Former   Smokeless tobacco: Never   Tobacco comments:    In college  Vaping Use   Vaping Use: Never used  Substance and Sexual Activity   Alcohol use: Not Currently   Drug use: No   Sexual activity: Never  Other Topics Concern   Not on file  Social History Narrative    Regular exercise: yes, [employer] 2x a week   Married 48 + years    Diet: fruit and veggies, no FF, some water, artificial sweetener   HCPOA: Janet Chapman, has living will, full code ( reviewed 2014)            Social Determinants of Health   Financial Resource Strain: Low Risk    Difficulty of Paying Living Expenses: Not hard at all  Food Insecurity: No Food Insecurity   Worried About Charity fundraiser in the Last Year: Never true   [place] in the Last Year: Never true  Transportation Needs: No Transportation Needs   Lack of Transportation (Medical): No   Lack of Transportation (Non-Medical): No  Physical Activity: Insufficiently Active   Days of Exercise per Week: 7 days   Minutes of Exercise per Session: 20 min  Stress: No Stress Concern Present   Feeling of Stress : Not at all  Social Connections: Moderately Integrated   Frequency of Communication with Friends and Family: Never   Frequency of Social Gatherings with Friends and Family: Once a week   Attends Religious Services: More than 4 times per year   Active Member of [employer] or Organizations: Yes   Attends Music therapist: More than 4 times per year   Marital Status: Married    Tobacco Counseling Counseling given: Not Answered Tobacco comments: In college   Clinical Intake:  Pre-visit preparation  completed: Yes  Pain : No/denies pain     BMI - recorded: 26.15 Nutritional Status: BMI 25 -29 Overweight Nutritional Risks: None Diabetes: Yes CBG done?: No (visit completed over the phone) Did pt. bring in CBG monitor from home?: No  How often do you need to have someone help you when you read instructions, pamphlets, or other written materials from your doctor or pharmacy?: 1 - Never  Diabetes:  Is the patient diabetic?  Yes  If diabetic, was a CBG obtained today?  No , visit completed over the phone. Did the patient bring in their glucometer from home?  No , visit completed over the phone. How often do you monitor your CBG's? Patient states blood sugars are not currently being checked.   Financial Strains and Diabetes Management:  Are you having any financial strains  with the device, your supplies or your medication? No .  Does the patient want to be seen by Chronic Care Management for management of their diabetes?  No  Would the patient like to be referred to a Nutritionist or for Diabetic Management?  No   Diabetic Exams:  Diabetic Eye Exam: Overdue for diabetic eye exam. Pt has been advised about the importance in completing this exam. Patient plans on scheduling an appointment.   Diabetic Foot Exam: Due, Pt has been advised about the importance in completing this exam. Pt is scheduled for diabetic foot exam on 03/24/21 with PCP.    Interpreter Needed?: No  Information entered by :: Orrin Brigham LPN   Activities of Daily Living In your present state of health, do you have any difficulty performing the following activities: 03/18/2021  Hearing? N  Vision? N  Difficulty concentrating or making decisions? N  Walking or climbing stairs? N  Dressing or bathing? N  Doing errands, shopping? N  Preparing Food and eating ? N  Using the Toilet? N  In the past six months, have you accidently leaked urine? N  Do you have problems with loss of bowel control? N  Managing  your Medications? N  Managing your Finances? N  Housekeeping or managing your Housekeeping? N  Some recent data might be hidden    Patient Care Team: [name], MD as PCP - General Belva Crome, MD as PCP - Cardiology (Cardiology) Shawnie Dapper, DO (Optometry) Debbora Dus, [facility] as Pharmacist (Pharmacist)  Indicate any recent Medical Services you may have received from other than [facility] providers in the past year (date may be approximate).     Assessment:   This is a routine wellness examination for Janet Chapman.  Hearing/Vision screen Hearing Screening - Comments:: No issues Vision Screening - Comments:: Last eye exam 02/2020, plans to schedule appointment, wears glasses, Dr Bing Plume  Dietary issues and exercise activities discussed: Current Exercise Habits: Home exercise routine, Type of exercise: walking, Time (Minutes): 15, Frequency (Times/Week): 7, Weekly Exercise (Minutes/Week): 105, Intensity: Mild   Goals Addressed             This Visit's Progress    Patient Stated       Would like to maintain current lifestyle by staying busy.        Depression Screen PHQ 2/9 Scores 03/18/2021 03/11/2020 12/20/2018 12/04/2017 12/14/2016 11/27/2016 10/01/2015  PHQ - 2 Score 0 0 0 0 0 0 0  PHQ- 9 Score - - - 0 - - -    Fall Risk Fall Risk  03/18/2021 03/11/2020 12/11/2019 12/20/2018 12/04/2017  Falls in the past year? 0 0 0 0 No  Comment - - Emmi Telephone Survey: data to providers prior to load - -  Number falls in past yr: 0 - - - -  Injury with Fall? 0 - - - -  Risk for fall due to : No Fall Risks - - - -  Follow up Falls prevention discussed - - - -    FALL RISK PREVENTION PERTAINING TO THE HOME:  Any stairs in or around the home? Yes  If so, are there any without handrails? No  Home free of loose throw rugs in walkways, pet beds, electrical cords, etc? Yes Adequate lighting in your home to reduce risk of falls? Yes   ASSISTIVE DEVICES UTILIZED TO PREVENT FALLS:  Life  alert? No  Use of a cane, walker or w/c? No  Grab bars in the  bathroom? Yes  Shower chair or bench in shower? Yes  Elevated toilet seat or a handicapped toilet? Yes   TIMED UP AND GO:  Was the test performed? No , visit completed over the phone.   Cognitive Function: Normal cognitive status assessed by this Nurse Health Advisor. No abnormalities found.   MMSE - Mini Mental State Exam 12/15/2020 06/24/2020 12/22/2019 06/18/2019 12/04/2017  Orientation to time 5 4 5 5 5   Orientation to Place 5 5 5 5 5   Registration 3 3 3 3 3   Attention/ Calculation 5 1 5 3  0  Attention/Calculation-comments spelled world - - - -  Recall 0 1 1 2 3   Language- name 2 objects 2 2 2 2  0  Language- repeat 1 1 1 1 1   Language- follow 3 step command 3 3 3 3 3   Language- read & follow direction 1 1 1 1  0  Write a sentence 1 1 1 1  0  Copy design 0 1 1 1  0  Total score 26 23 28 27 20         Immunizations Immunization History  Administered Date(s) Administered   Fluad Quad(high Dose 65+) 03/11/2020   Influenza Split 03/16/2011, 02/21/2012, 02/06/2013   Influenza Whole 02/13/2007, 03/12/2008, 02/09/2009, 02/12/2009   Influenza, High Dose Seasonal PF 02/19/2017   Influenza, Seasonal, Injecte, Preservative Fre 02/17/2014   Influenza,inj,Quad PF,6+ Mos 01/07/2015, 02/03/2016, 02/21/2018   Influenza,inj,quad, With Preservative 01/24/2019   PFIZER(Purple Top)SARS-COV-2 Vaccination 06/05/2019, 06/26/2019, 04/13/2020   Pneumococcal Conjugate-13 06/26/2013   Pneumococcal Polysaccharide-23 05/16/2003, 05/20/2010   Td 04/19/2007   Zoster, Live 05/20/2010    TDAP status: Due, Education has been provided regarding the importance of this vaccine. Advised may receive this vaccine at local pharmacy or Health Dept. Aware to provide a copy of the vaccination record if obtained from local pharmacy or Health Dept. Verbalized acceptance and understanding.  Flu Vaccine status: Due, Education has been provided regarding the  importance of this vaccine. Advised may receive this vaccine at local pharmacy or Health Dept. Aware to provide a copy of the vaccination record if obtained from local pharmacy or Health Dept. Verbalized acceptance and understanding.  Pneumococcal vaccine status: Up to date  Covid-19 vaccine status: Information provided on how to obtain vaccines.   Qualifies for Shingles Vaccine? Yes   Zostavax completed Yes   Shingrix Completed?: No.    Education has been provided regarding the importance of this vaccine. Patient has been advised to call insurance company to determine out of pocket expense if they have not yet received this vaccine. Advised may also receive vaccine at local pharmacy or Health Dept. Verbalized acceptance and understanding.  Screening Tests Health Maintenance  Topic Date Due   Zoster Vaccines- Shingrix (1 of 2) Never done   TETANUS/TDAP  04/18/2017   COVID-19 Vaccine (4 - Booster for Pfizer series) 06/08/2020   INFLUENZA VACCINE  12/13/2020   HEMOGLOBIN A1C  01/05/2021   FOOT EXAM  02/15/2021   OPHTHALMOLOGY EXAM  03/05/2021   MAMMOGRAM  05/28/2021   DEXA SCAN  08/17/2021   Pneumonia Vaccine 49+ Years old  Completed   HPV VACCINES  Aged Out    Health Maintenance  Health Maintenance Due  Topic Date Due   Zoster Vaccines- Shingrix (1 of 2) Never done   TETANUS/TDAP  04/18/2017   COVID-19 Vaccine (4 - Booster for Pfizer series) 06/08/2020   INFLUENZA VACCINE  12/13/2020   HEMOGLOBIN A1C  01/05/2021   FOOT EXAM  02/15/2021   OPHTHALMOLOGY  EXAM  03/05/2021    Colorectal cancer screening: No longer required.   Mammogram status: Ordered 03/18/21. Pt provided with contact info and advised to call to schedule appt.   Bone Density status: Ordered 03/18/21. Pt provided with contact info and advised to call to schedule appt.  Lung Cancer Screening: (Low Dose CT Chest recommended if Age 54-80 years, 30 pack-year currently smoking OR have quit w/in 15years.) does not  qualify.     Additional Screening:  Hepatitis C Screening: does not qualify;   Vision Screening: Recommended annual ophthalmology exams for early detection of glaucoma and other disorders of the eye. Is the patient up to date with their annual eye exam?  No , patient plans on scheduling an appointment.  Who is the provider or what is the name of the office in which the patient attends annual eye exams? Dr. Bing Plume   Dental Screening: Recommended annual dental exams for proper oral hygiene  Community Resource Referral / Chronic Care Management: CRR required this visit?  No   CCM required this visit?  No      Plan:     I have personally reviewed and noted the following in the patient's chart:   Medical and social history Use of alcohol, tobacco or illicit drugs  Current medications and supplements including opioid prescriptions.  Functional ability and status Nutritional status Physical activity Advanced directives List of other physicians Hospitalizations, surgeries, and ER visits in previous 12 months Vitals Screenings to include cognitive, depression, and falls Referrals and appointments  In addition, I have reviewed and discussed with patient certain preventive protocols, quality metrics, and best practice recommendations. A written personalized care plan for preventive services as well as general preventive health recommendations were provided to patient.   Due to this being a telephonic visit, the after visit summary with patients personalized plan was offered to patient via mail or my-chart. Patient preferred to pick up at office at next visit.    Loma Messing, LPN   [id number]   Nurse Health Advisor  Nurse Notes: None

## 2021-03-16 ENCOUNTER — Telehealth: Payer: Self-pay | Admitting: Family Medicine

## 2021-03-16 DIAGNOSIS — E1159 Type 2 diabetes mellitus with other circulatory complications: Secondary | ICD-10-CM

## 2021-03-16 NOTE — Telephone Encounter (Signed)
-----   Message from Ellamae Sia sent at 03/04/2021 10:42 AM EDT ----- Regarding: Lab orders for Friday, 11.4.22 Patient is scheduled for CPX labs, please order future labs, Thanks , Karna Christmas

## 2021-03-18 ENCOUNTER — Other Ambulatory Visit: Payer: PPO

## 2021-03-18 ENCOUNTER — Ambulatory Visit (INDEPENDENT_AMBULATORY_CARE_PROVIDER_SITE_OTHER): Payer: PPO

## 2021-03-18 VITALS — Ht 62.0 in | Wt 143.0 lb

## 2021-03-18 DIAGNOSIS — Z0000 Encounter for general adult medical examination without abnormal findings: Secondary | ICD-10-CM | POA: Diagnosis not present

## 2021-03-18 DIAGNOSIS — Z1231 Encounter for screening mammogram for malignant neoplasm of breast: Secondary | ICD-10-CM | POA: Diagnosis not present

## 2021-03-18 DIAGNOSIS — Z780 Asymptomatic menopausal state: Secondary | ICD-10-CM | POA: Diagnosis not present

## 2021-03-18 NOTE — Patient Instructions (Signed)
Janet Chapman , Thank you for taking time to complete your Medicare Wellness Visit. I appreciate your ongoing commitment to your health goals. Please review the following plan we discussed and let me know if I can assist you in the future.   Screening recommendations/referrals: Colonoscopy: no longer required  Mammogram: Ordered today, someone will call to schedule  Bone Density: Ordered today, someone will call to schedule  Recommended yearly ophthalmology/optometry visit for glaucoma screening and checkup Recommended yearly dental visit for hygiene and checkup  Vaccinations: Influenza vaccine: Due-May obtain vaccine at our office or your local pharmacy.  Pneumococcal vaccine: up to date Tdap vaccine: Due, last completed 04/19/07, Discuss with your local pharmacy Shingles vaccine: Discuss with pharmacy    Covid-19: newest booster available at local pharmacy  Advanced directives: information available at your next scheduled appointment  Conditions/risks identified: see problem list  Next appointment: Follow up in one year for your annual wellness visit 03/23/22 @ 9:00am, this is a telephone visit per your request.    Preventive Care 7 Years and Older, Female Preventive care refers to lifestyle choices and visits with your health care provider that can promote health and wellness. What does preventive care include? A yearly physical exam. This is also called an annual well check. Dental exams once or twice a year. Routine eye exams. Ask your health care provider how often you should have your eyes checked. Personal lifestyle choices, including: Daily care of your teeth and gums. Regular physical activity. Eating a healthy diet. Avoiding tobacco and drug use. Limiting alcohol use. Practicing safe sex. Taking low-dose aspirin every day. Taking vitamin and mineral supplements as recommended by your health care provider. What happens during an annual well check? The services and  screenings done by your health care provider during your annual well check will depend on your age, overall health, lifestyle risk factors, and family history of disease. Counseling  Your health care provider may ask you questions about your: Alcohol use. Tobacco use. Drug use. Emotional well-being. Home and relationship well-being. Sexual activity. Eating habits. History of falls. Memory and ability to understand (cognition). Work and work Statistician. Reproductive health. Screening  You may have the following tests or measurements: Height, weight, and BMI. Blood pressure. Lipid and cholesterol levels. These may be checked every 5 years, or more frequently if you are over 19 years old. Skin check. Lung cancer screening. You may have this screening every year starting at age 3 if you have a 30-pack-year history of smoking and currently smoke or have quit within the past 15 years. Fecal occult blood test (FOBT) of the stool. You may have this test every year starting at age 15. Flexible sigmoidoscopy or colonoscopy. You may have a sigmoidoscopy every 5 years or a colonoscopy every 10 years starting at age 5. Hepatitis C blood test. Hepatitis B blood test. Sexually transmitted disease (STD) testing. Diabetes screening. This is done by checking your blood sugar (glucose) after you have not eaten for a while (fasting). You may have this done every 1-3 years. Bone density scan. This is done to screen for osteoporosis. You may have this done starting at age 19. Mammogram. This may be done every 1-2 years. Talk to your health care provider about how often you should have regular mammograms. Talk with your health care provider about your test results, treatment options, and if necessary, the need for more tests. Vaccines  Your health care provider may recommend certain vaccines, such as: Influenza vaccine. This is  recommended every year. Tetanus, diphtheria, and acellular pertussis (Tdap,  Td) vaccine. You may need a Td booster every 10 years. Zoster vaccine. You may need this after age 31. Pneumococcal 13-valent conjugate (PCV13) vaccine. One dose is recommended after age 31. Pneumococcal polysaccharide (PPSV23) vaccine. One dose is recommended after age 89. Talk to your health care provider about which screenings and vaccines you need and how often you need them. This information is not intended to replace advice given to you by your health care provider. Make sure you discuss any questions you have with your health care provider. Document Released: 05/28/2015 Document Revised: 01/19/2016 Document Reviewed: 03/02/2015 Elsevier Interactive Patient Education  2017 [place] Prevention in the Home Falls can cause injuries. They can happen to people of all ages. There are many things you can do to make your home safe and to help prevent falls. What can I do on the outside of my home? Regularly fix the edges of walkways and driveways and fix any cracks. Remove anything that might make you trip as you walk through a door, such as a raised step or threshold. Trim any bushes or trees on the path to your home. Use bright outdoor lighting. Clear any walking paths of anything that might make someone trip, such as rocks or tools. Regularly check to see if handrails are loose or broken. Make sure that both sides of any steps have handrails. Any raised decks and porches should have guardrails on the edges. Have any leaves, snow, or ice cleared regularly. Use sand or salt on walking paths during winter. Clean up any spills in your garage right away. This includes oil or grease spills. What can I do in the bathroom? Use night lights. Install grab bars by the toilet and in the tub and shower. Do not use towel bars as grab bars. Use non-skid mats or decals in the tub or shower. If you need to sit down in the shower, use a plastic, non-slip stool. Keep the floor dry. Clean up any  water that spills on the floor as soon as it happens. Remove soap buildup in the tub or shower regularly. Attach bath mats securely with double-sided non-slip rug tape. Do not have throw rugs and other things on the floor that can make you trip. What can I do in the bedroom? Use night lights. Make sure that you have a light by your bed that is easy to reach. Do not use any sheets or blankets that are too big for your bed. They should not hang down onto the floor. Have a firm chair that has side arms. You can use this for support while you get dressed. Do not have throw rugs and other things on the floor that can make you trip. What can I do in the kitchen? Clean up any spills right away. Avoid walking on wet floors. Keep items that you use a lot in easy-to-reach places. If you need to reach something above you, use a strong step stool that has a grab bar. Keep electrical cords out of the way. Do not use floor polish or wax that makes floors slippery. If you must use wax, use non-skid floor wax. Do not have throw rugs and other things on the floor that can make you trip. What can I do with my stairs? Do not leave any items on the stairs. Make sure that there are handrails on both sides of the stairs and use them. Fix handrails that are  broken or loose. Make sure that handrails are as long as the stairways. Check any carpeting to make sure that it is firmly attached to the stairs. Fix any carpet that is loose or worn. Avoid having throw rugs at the top or bottom of the stairs. If you do have throw rugs, attach them to the floor with carpet tape. Make sure that you have a light switch at the top of the stairs and the bottom of the stairs. If you do not have them, ask someone to add them for you. What else can I do to help prevent falls? Wear shoes that: Do not have high heels. Have rubber bottoms. Are comfortable and fit you well. Are closed at the toe. Do not wear sandals. If you use a  stepladder: Make sure that it is fully opened. Do not climb a closed stepladder. Make sure that both sides of the stepladder are locked into place. Ask someone to hold it for you, if possible. Clearly mark and make sure that you can see: Any grab bars or handrails. First and last steps. Where the edge of each step is. Use tools that help you move around (mobility aids) if they are needed. These include: Canes. Walkers. Scooters. Crutches. Turn on the lights when you go into a dark area. Replace any light bulbs as soon as they burn out. Set up your furniture so you have a clear path. Avoid moving your furniture around. If any of your floors are uneven, fix them. If there are any pets around you, be aware of where they are. Review your medicines with your doctor. Some medicines can make you feel dizzy. This can increase your chance of falling. Ask your doctor what other things that you can do to help prevent falls. This information is not intended to replace advice given to you by your health care provider. Make sure you discuss any questions you have with your health care provider. Document Released: 02/25/2009 Document Revised: 10/07/2015 Document Reviewed: 06/05/2014 Elsevier Interactive Patient Education  2017 [place]

## 2021-03-21 LAB — HM DIABETES FOOT EXAM

## 2021-03-24 ENCOUNTER — Encounter: Payer: Self-pay | Admitting: Family Medicine

## 2021-03-24 ENCOUNTER — Ambulatory Visit (INDEPENDENT_AMBULATORY_CARE_PROVIDER_SITE_OTHER): Payer: PPO | Admitting: Family Medicine

## 2021-03-24 ENCOUNTER — Other Ambulatory Visit: Payer: Self-pay

## 2021-03-24 VITALS — BP 120/70 | HR 52 | Temp 97.7°F | Ht 62.0 in | Wt 141.5 lb

## 2021-03-24 DIAGNOSIS — Z23 Encounter for immunization: Secondary | ICD-10-CM | POA: Diagnosis not present

## 2021-03-24 DIAGNOSIS — E1169 Type 2 diabetes mellitus with other specified complication: Secondary | ICD-10-CM

## 2021-03-24 DIAGNOSIS — E1159 Type 2 diabetes mellitus with other circulatory complications: Secondary | ICD-10-CM

## 2021-03-24 DIAGNOSIS — Z0000 Encounter for general adult medical examination without abnormal findings: Secondary | ICD-10-CM

## 2021-03-24 DIAGNOSIS — I152 Hypertension secondary to endocrine disorders: Secondary | ICD-10-CM

## 2021-03-24 DIAGNOSIS — Z780 Asymptomatic menopausal state: Secondary | ICD-10-CM

## 2021-03-24 DIAGNOSIS — Z1231 Encounter for screening mammogram for malignant neoplasm of breast: Secondary | ICD-10-CM | POA: Diagnosis not present

## 2021-03-24 DIAGNOSIS — E785 Hyperlipidemia, unspecified: Secondary | ICD-10-CM

## 2021-03-24 DIAGNOSIS — F03A0 Unspecified dementia, mild, without behavioral disturbance, psychotic disturbance, mood disturbance, and anxiety: Secondary | ICD-10-CM

## 2021-03-24 LAB — COMPREHENSIVE METABOLIC PANEL
ALT: 12 U/L (ref 0–35)
AST: 18 U/L (ref 0–37)
Albumin: 4.7 g/dL (ref 3.5–5.2)
Alkaline Phosphatase: 60 U/L (ref 39–117)
BUN: 14 mg/dL (ref 6–23)
CO2: 37 mEq/L — ABNORMAL HIGH (ref 19–32)
Calcium: 10.8 mg/dL — ABNORMAL HIGH (ref 8.4–10.5)
Chloride: 96 mEq/L (ref 96–112)
Creatinine, Ser: 0.76 mg/dL (ref 0.40–1.20)
GFR: 72.55 mL/min (ref >60.00)
Glucose, Bld: 102 mg/dL — ABNORMAL HIGH (ref 70–99)
Potassium: 4.1 mEq/L (ref 3.5–5.1)
Sodium: 138 mEq/L (ref 135–145)
Total Bilirubin: 0.7 mg/dL (ref 0.2–1.2)
Total Protein: 7.3 g/dL (ref 6.0–8.3)

## 2021-03-24 LAB — LIPID PANEL
Cholesterol: 288 mg/dL — ABNORMAL HIGH (ref 0–200)
HDL: 81.2 mg/dL (ref >39.00)
LDL Cholesterol: 184 mg/dL — ABNORMAL HIGH (ref 0–99)
NonHDL: 207.04
Total CHOL/HDL Ratio: 4
Triglycerides: 116 mg/dL (ref 0.0–149.0)
VLDL: 23.2 mg/dL (ref 0.0–40.0)

## 2021-03-24 LAB — HEMOGLOBIN A1C: Hgb A1c MFr Bld: 6.4 % (ref 4.6–6.5)

## 2021-03-24 MED ORDER — TRIAMCINOLONE ACETONIDE 0.5 % EX CREA: 1.0000 "application " | TOPICAL_CREAM | Freq: Two times a day (BID) | CUTANEOUS | 0 refills | Status: DC

## 2021-03-24 NOTE — Progress Notes (Signed)
Patient ID: Janet Chapman, female    DOB: Oct 12, 1935, 85 y.o.   MRN: [id number]  This visit was conducted in person.  BP 120/70   Pulse (!) 52   Temp 97.7 F (36.5 C) (Temporal)   Ht 5\' 2"  (1.575 m)   Wt 141 lb 8 oz (64.2 kg)   SpO2 97%   BMI 25.88 kg/m    CC: Chief Complaint  Patient presents with   Annual Exam    Part 2    Subjective:   HPI: Janet Chapman is a 85 y.o. female presenting on 03/24/2021 for Annual Exam (Part 2) The patient presents for  complete physical and review of chronic health problems. He/She also has the following acute concerns today:  The patient saw a LPN or RN for medicare wellness visit.  Prevention and wellness was reviewed in detail. Note reviewed and important notes copied below.  Screening recommendations/referrals: Colonoscopy: no longer required  Mammogram: Ordered today, someone will call to schedule  Bone Density: Ordered today, someone will call to schedule  Recommended yearly ophthalmology/optometry visit for glaucoma screening and checkup Recommended yearly dental visit for hygiene and checkup   Vaccinations: Influenza vaccine: Due-May obtain vaccine at our office or your local pharmacy.  Pneumococcal vaccine: up to date Tdap vaccine: Due, last completed 04/19/07, Discuss with your local pharmacy Shingles vaccine: Discuss with pharmacy    Covid-19: newest booster available at local pharmacy   Diabetes:  due for re-eval. Using medications without difficulties:none Hypoglycemic episodes: Hyperglycemic episodes: Feet problems: none Blood Sugars averaging: eye exam within last year: due   Dementia,  on memantine.Marland Kitchen stopped given itchy feet SE. but memory worsened. Itchy feet did not seem to resolve off memantine.   Relevant past medical, surgical, family and social history reviewed and updated as indicated. Interim medical history since our last visit reviewed. Allergies and medications reviewed and  updated. Outpatient Medications Prior to Visit  Medication Sig Dispense Refill   aspirin 81 MG tablet Take 81 mg by mouth at bedtime.     ketoconazole (NIZORAL) 2 % shampoo Apply 1 application topically once a week. 120 mL 0   losartan-hydrochlorothiazide (HYZAAR) 100-25 MG tablet Take 0.5 tablets by mouth daily. 45 tablet 1   memantine (NAMENDA) 5 MG tablet Take 5 mg by mouth daily.     metoprolol succinate (TOPROL-XL) 50 MG 24 hr tablet Take 0.5 tablets (25 mg total) by mouth daily. 45 tablet 1   montelukast (SINGULAIR) 10 MG tablet Take 10 mg by mouth at bedtime as needed.     No facility-administered medications prior to visit.     Per HPI unless specifically indicated in ROS section below Review of Systems  Constitutional:  Negative for fatigue and fever.  HENT:  Negative for congestion.   Eyes:  Negative for pain.  Respiratory:  Negative for cough and shortness of breath.   Cardiovascular:  Negative for chest pain, palpitations and leg swelling.  Gastrointestinal:  Negative for abdominal pain.  Genitourinary:  Negative for dysuria and vaginal bleeding.  Musculoskeletal:  Negative for back pain.  Neurological:  Negative for syncope, light-headedness and headaches.  Psychiatric/Behavioral:  Negative for dysphoric mood.   Objective:  BP 120/70   Pulse (!) 52   Temp 97.7 F (36.5 C) (Temporal)   Ht 5\' 2"  (1.575 m)   Wt 141 lb 8 oz (64.2 kg)   SpO2 97%   BMI 25.88 kg/m   Wt Readings from Last 3 Encounters:  03/24/21 141 lb 8 oz (64.2 kg)  03/18/21 143 lb (64.9 kg)  02/24/21 143 lb (64.9 kg)      Physical Exam Constitutional:      General: She is not in acute distress.    Appearance: Normal appearance. She is well-developed. She is not ill-appearing or toxic-appearing.  HENT:     Head: Normocephalic.     Right Ear: Hearing, tympanic membrane, ear canal and external ear normal. Tympanic membrane is not erythematous, retracted or bulging.     Left Ear: Hearing, tympanic  membrane, ear canal and external ear normal. Tympanic membrane is not erythematous, retracted or bulging.     Nose: No mucosal edema or rhinorrhea.     Right Sinus: No maxillary sinus tenderness or frontal sinus tenderness.     Left Sinus: No maxillary sinus tenderness or frontal sinus tenderness.     Mouth/Throat:     Pharynx: Uvula midline.  Eyes:     General: Lids are normal. Lids are everted, no foreign bodies appreciated.     Conjunctiva/sclera: Conjunctivae normal.     Pupils: Pupils are equal, round, and reactive to light.  Neck:     Thyroid: No thyroid mass or thyromegaly.     Vascular: No carotid bruit.     Trachea: Trachea normal.  Cardiovascular:     Rate and Rhythm: Normal rate and regular rhythm.     Pulses: Normal pulses.     Heart sounds: Normal heart sounds, S1 normal and S2 normal. No murmur heard.   No friction rub. No gallop.  Pulmonary:     Effort: Pulmonary effort is normal. No tachypnea or respiratory distress.     Breath sounds: Normal breath sounds. No decreased breath sounds, wheezing, rhonchi or rales.  Abdominal:     General: Bowel sounds are normal.     Palpations: Abdomen is soft.     Tenderness: There is no abdominal tenderness.  Musculoskeletal:     Cervical back: Normal range of motion and neck supple.  Skin:    General: Skin is warm and dry.     Findings: No rash.  Neurological:     Mental Status: She is alert.  Psychiatric:        Mood and Affect: Mood is not anxious or depressed.        Speech: Speech normal.        Behavior: Behavior normal. Behavior is cooperative.        Thought Content: Thought content normal.        Judgment: Judgment normal.    Diabetic foot exam: Normal inspection No skin breakdown No calluses  Normal DP pulses Normal sensation to light touch and monofilament Nails normal     Results for orders placed or performed in visit on 02/24/21  TSH  Result Value Ref Range   TSH 2.65 0.35 - 5.50 uIU/mL    This  visit occurred during the SARS-CoV-2 public health emergency.  Safety protocols were in place, including screening questions prior to the visit, additional usage of staff PPE, and extensive cleaning of exam room while observing appropriate contact time as indicated for disinfecting solutions.   COVID 19 screen:  No recent travel or known exposure to COVID19 The patient denies respiratory symptoms of COVID 19 at this time. The importance of social distancing was discussed today.   Assessment and Plan The patient's preventative maintenance and recommended screening tests for an annual wellness exam were reviewed in full today. Brought up to date unless  services declined.  Counselled on the importance of diet, exercise, and its role in overall health and mortality. The patient's FH and SH was reviewed, including their home life, tobacco status, and drug and alcohol status.   Vaccines: Due for  flu,  Uptodate with  pneumovax, prevnar.  Get shingrix at pharmacy. Mammo:   3D Had 4/18 nml, no family history of breast cancer... Wishes to continue after every 2 year. 05/2019 DVE: pap and DVE not indicated. TAH. Sister with ovarian cancer   DEXA: Normal, 08/2016 repeat 5 year work on walking, Ca and Vit D.. due this year Colon: 02/01/2005  Nml, Dr. Wynetta Emery, 12/2016 cologuard Nonsmoker    Problem List Items Addressed This Visit     Hyperlipidemia associated with type 2 diabetes mellitus ([place]) (Chronic)    Due for re-eval.      Hypertension associated with diabetes ([place]) (Chronic)    Stable, chronic.  Continue current medication.         Type 2 diabetes mellitus with circulatory disorder HTN ([facility]) (Chronic)    Due for re-eval.      Mild dementia without behavioral disturbance, psychotic disturbance, mood disturbance, or anxiety   Relevant Medications   memantine (NAMENDA) 5 MG tablet   Other Visit Diagnoses     Routine general medical examination at a health care facility    -  Primary    Postmenopausal estrogen deficiency       Screening mammogram for breast cancer       Need for influenza vaccination       Relevant Orders   Flu Vaccine QUAD High Dose(Fluad) (Completed)   Need for Td vaccine       Relevant Orders   Td : Tetanus/diphtheria >7yo Preservative  free (Completed)      Orders Released This Visit (5)                               Comprehensive metabolic panel - Released 03/24/2021 11:14 AM                         Routine, Clinic Collect                               DEXAScan - Released 03/24/2021 11:45 AM                         Routine, Ancillary Performed                       Hemoglobin A1c - Released 03/24/2021 11:14 AM                         Routine, Clinic Collect               Lipid panel - Released 03/24/2021 11:14 AM                         Routine, Clinic Collect        [name], MD

## 2021-03-24 NOTE — Patient Instructions (Addendum)
Please stop at the lab to have labs drawn.  Get Shingrix vaccine series at pharmacy as well as Bivalent COVID vaccine.   Please call the location of your choice from the menu below to schedule your Mammogram and/or Bone Density appointment.    [facility]                        Phone:  [phone] [address]                                  [place], [place] [address]                                            Services:  3D Mammogram and Bone Density

## 2021-03-30 ENCOUNTER — Other Ambulatory Visit: Payer: Self-pay | Admitting: Family Medicine

## 2021-03-30 MED ORDER — SIMVASTATIN 40 MG PO TABS: 40.0000 mg | ORAL_TABLET | Freq: Every day | ORAL | 3 refills | Status: DC

## 2021-03-30 NOTE — Progress Notes (Signed)
Rx for simvastatin 40 mg daily sent in.

## 2021-05-03 DIAGNOSIS — F03A0 Unspecified dementia, mild, without behavioral disturbance, psychotic disturbance, mood disturbance, and anxiety: Secondary | ICD-10-CM | POA: Insufficient documentation

## 2021-05-03 DIAGNOSIS — F0390 Unspecified dementia without behavioral disturbance: Secondary | ICD-10-CM | POA: Insufficient documentation

## 2021-05-03 NOTE — Assessment & Plan Note (Signed)
Due for re-eval. 

## 2021-05-03 NOTE — Assessment & Plan Note (Signed)
Stable, chronic.  Continue current medication.    

## 2021-05-03 NOTE — Assessment & Plan Note (Signed)
Intolerant of Aricept. Tolerating memantine.. encouraged her to continue despite possible itchy feet sensation.

## 2021-05-17 DIAGNOSIS — M85832 Other specified disorders of bone density and structure, left forearm: Secondary | ICD-10-CM | POA: Diagnosis not present

## 2021-05-17 DIAGNOSIS — Z780 Asymptomatic menopausal state: Secondary | ICD-10-CM | POA: Diagnosis not present

## 2021-05-17 DIAGNOSIS — Z1231 Encounter for screening mammogram for malignant neoplasm of breast: Secondary | ICD-10-CM | POA: Diagnosis not present

## 2021-05-17 LAB — HM DEXA SCAN

## 2021-05-17 LAB — HM MAMMOGRAPHY

## 2021-05-18 ENCOUNTER — Encounter: Payer: Self-pay | Admitting: Family Medicine

## 2021-05-20 ENCOUNTER — Encounter: Payer: Self-pay | Admitting: Family Medicine

## 2021-06-03 ENCOUNTER — Other Ambulatory Visit: Payer: Self-pay | Admitting: Family Medicine

## 2021-06-09 ENCOUNTER — Other Ambulatory Visit: Payer: Self-pay | Admitting: Neurology

## 2021-06-11 ENCOUNTER — Other Ambulatory Visit: Payer: Self-pay | Admitting: Neurology

## 2021-06-13 ENCOUNTER — Telehealth: Payer: Self-pay | Admitting: Neurology

## 2021-06-13 MED ORDER — MEMANTINE HCL 5 MG PO TABS: 5.0000 mg | ORAL_TABLET | Freq: Every day | ORAL | 0 refills | Status: DC

## 2021-06-13 NOTE — Addendum Note (Signed)
Addended by: Desmond Lope on: 06/13/2021 09:38 AM   Modules accepted: Orders

## 2021-06-13 NOTE — Telephone Encounter (Signed)
Pending appt 06/30/21. Refill sent to pharmacy. Pt's husband notified.

## 2021-06-13 NOTE — Telephone Encounter (Signed)
Pt's husband called in stating that pt is completely out of memantine (NAMENDA) 5 MG tablet and needs a refill sent in ASAP to [facility]. Pt would like a call when this has been sent.

## 2021-06-17 DIAGNOSIS — H43813 Vitreous degeneration, bilateral: Secondary | ICD-10-CM | POA: Diagnosis not present

## 2021-06-17 DIAGNOSIS — H5203 Hypermetropia, bilateral: Secondary | ICD-10-CM | POA: Diagnosis not present

## 2021-06-17 DIAGNOSIS — H52203 Unspecified astigmatism, bilateral: Secondary | ICD-10-CM | POA: Diagnosis not present

## 2021-06-17 DIAGNOSIS — H25813 Combined forms of age-related cataract, bilateral: Secondary | ICD-10-CM | POA: Diagnosis not present

## 2021-06-17 LAB — HM DIABETES EYE EXAM

## 2021-06-30 ENCOUNTER — Ambulatory Visit: Payer: HMO | Admitting: Neurology

## 2021-06-30 VITALS — BP 148/83 | HR 65 | Ht 62.0 in | Wt 141.0 lb

## 2021-06-30 DIAGNOSIS — F03A0 Unspecified dementia, mild, without behavioral disturbance, psychotic disturbance, mood disturbance, and anxiety: Secondary | ICD-10-CM

## 2021-06-30 MED ORDER — MEMANTINE HCL ER 7 MG PO CP24: 7.0000 mg | ORAL_CAPSULE | Freq: Every day | ORAL | 0 refills | Status: DC

## 2021-06-30 NOTE — Progress Notes (Addendum)
PATIENT: Janet Chapman DOB: 27-Dec-1935  REASON FOR VISIT: Follow up for memory HISTORY FROM: Patient PRIMARY NEUROLOGIST: Dr. [name] now will be followed by Dr. [name].   HISTORY OF PRESENT ILLNESS: Today 06/30/21 Janet Chapman here today for follow-up with history of memory trouble.  Has been unable to tolerate memory medications well, is able to tolerate namenda 5 mg twice daily, but has to take with supper and then midnight. She thinks main issue is repeating herself. Does her own ADLs, drives, does housework, goes to church. For a few months they stopped namenda because of dizziness, her memory "nose dived". Good improvement when she went back on it. MMSE 19/30 today was 26/30 in August 2022. Here today with her husband.  HISTORY  12/15/2020 MM: Janet Chapman is an 86 year old female with a history of memory disturbance.  She returns today for follow-up.  The patient has tried multiple medications in the past but has not been able to tolerate them.  These medications include Exelon, Aricept and Namenda.  She feels that her memory has remained stable.  She lives at home with her husband.  She is able to complete all ADLs independently.  She continues to complete all household chores.  She is able to drive to familiar places.  Denies any trouble sleeping.  She manages her own medications and appointments.  She also manages the finances without difficulty.  She returns today for an evaluation.  REVIEW OF SYSTEMS: Out of a complete 14 system review of symptoms, the patient complains only of the following symptoms, and all other reviewed systems are negative.  See HPI  ALLERGIES: Allergies  Allergen Reactions   Codeine Nausea Only    HOME MEDICATIONS: Outpatient Medications Prior to Visit  Medication Sig Dispense Refill   aspirin 81 MG tablet Take 81 mg by mouth at bedtime.     ketoconazole (NIZORAL) 2 % shampoo Apply 1 application topically once a week. 120 mL 0   losartan-hydrochlorothiazide  (HYZAAR) 100-25 MG tablet Take 0.5 tablets by mouth daily. 45 tablet 1   memantine (NAMENDA) 5 MG tablet Take 1 tablet (5 mg total) by mouth daily. 30 tablet 0   metoprolol succinate (TOPROL-XL) 50 MG 24 hr tablet Take 0.5 tablets (25 mg total) by mouth daily. 45 tablet 1   montelukast (SINGULAIR) 10 MG tablet TAKE 1 TABLET BY MOUTH AT BEDTIME 30 tablet 2   simvastatin (ZOCOR) 40 MG tablet Take 1 tablet (40 mg total) by mouth at bedtime. 90 tablet 3   triamcinolone cream (KENALOG) 0.5 % Apply 1 application topically 2 (two) times daily. As needed for itchy feet 30 g 0   No facility-administered medications prior to visit.    PAST MEDICAL HISTORY: Past Medical History:  Diagnosis Date   Allergic rhinitis    Diabetes mellitus without complication ([place])    diet controlled   GERD (gastroesophageal reflux disease)    Hyperlipidemia    Hypertension    Memory loss    Osteoarthritis    Urinary incontinence     PAST SURGICAL HISTORY: Past Surgical History:  Procedure Laterality Date   ABDOMINAL HYSTERECTOMY  1993   TA   CARDIOVASCULAR STRESS TEST  2004   H. [name], cardiolite-normal   CHOLECYSTECTOMY  0814'G   COLONOSCOPY     LEFT HEART CATH AND CORONARY ANGIOGRAPHY N/A 11/06/2017   Procedure: LEFT HEART CATH AND CORONARY ANGIOGRAPHY;  Surgeon: Burnell Blanks, MD;  Location: [facility];  Service: Cardiovascular;  Laterality: N/A;    FAMILY HISTORY: Family History  Problem Relation Age of Onset   Heart attack Father 69   Dementia Mother    Stroke Mother 37   Ovarian cancer Sister 14   Diabetes Other        Maternal side   Ovarian cancer Other        Grandmother   Ovarian cancer Other        aunt   Colon cancer Neg Hx     SOCIAL HISTORY: Social History   Socioeconomic History   Marital status: Married    Spouse name: Not on file   Number of children: 0   Years of education: Not on file   Highest education level: Not on file  Occupational History    Occupation: retired  Tobacco Use   Smoking status: Former   Smokeless tobacco: Never   Tobacco comments:    In college  Vaping Use   Vaping Use: Never used  Substance and Sexual Activity   Alcohol use: Not Currently   Drug use: No   Sexual activity: Never  Other Topics Concern   Not on file  Social History Narrative    Regular exercise: yes, Curves 2x a week   Married 48 + years    Diet: fruit and veggies, no FF, some water, artificial sweetener   HCPOA: [name], has living will, full code ( reviewed 2014)            Social Determinants of Health   Financial Resource Strain: Low Risk    Difficulty of Paying Living Expenses: Not hard at all  Food Insecurity: No Food Insecurity   Worried About Charity fundraiser in the Last Year: Never true   [place] in the Last Year: Never true  Transportation Needs: No Transportation Needs   Lack of Transportation (Medical): No   Lack of Transportation (Non-Medical): No  Physical Activity: Insufficiently Active   Days of Exercise per Week: 7 days   Minutes of Exercise per Session: 20 min  Stress: No Stress Concern Present   Feeling of Stress : Not at all  Social Connections: Moderately Integrated   Frequency of Communication with Friends and Family: Never   Frequency of Social Gatherings with Friends and Family: Once a week   Attends Religious Services: More than 4 times per year   Active Member of [employer] or Organizations: Yes   Attends Music therapist: More than 4 times per year   Marital Status: Married  Human resources officer Violence: Not At Risk   Fear of Current or Ex-Partner: No   Emotionally Abused: No   Physically Abused: No   Sexually Abused: No   PHYSICAL EXAM  Vitals:   06/30/21 1031  BP: (!) 148/83  Pulse: 65  Weight: 141 lb (64 kg)  Height: 5\' 2"  (1.575 m)   Body mass index is 25.79 kg/m.  Generalized: Well developed, in no acute distress  MMSE - Mini Mental State Exam 06/30/2021  12/15/2020 06/24/2020  Orientation to time 3 5 4   Orientation to Place 3 5 5   Registration 3 3 3   Attention/ Calculation 1 5 1   Attention/Calculation-comments - spelled world -  Recall 0 0 1  Language- name 2 objects 2 2 2   Language- repeat 1 1 1   Language- follow 3 step command 3 3 3   Language- read & follow direction 1 1 1   Write a sentence 1 1 1   Copy design 1  0 1  Total score 19 26 23    Neurological examination  Mentation: Alert oriented to time, place, history taking. Follows all commands speech and language fluent Cranial nerve II-XII: Pupils were equal round reactive to light. Extraocular movements were full, visual field were full on confrontational test. Facial sensation and strength were normal. Head turning and shoulder shrug  were normal and symmetric. Motor: The motor testing reveals 5 over 5 strength of all 4 extremities. Good symmetric motor tone is noted throughout.  Sensory: Sensory testing is intact to soft touch on all 4 extremities. No evidence of extinction is noted.  Coordination: Cerebellar testing reveals good finger-nose-finger and heel-to-shin bilaterally.  Gait and station: Gait is normal. Reflexes: Deep tendon reflexes are symmetric and normal bilaterally.   DIAGNOSTIC DATA (LABS, IMAGING, TESTING) - I reviewed patient records, labs, notes, testing and imaging myself where available.  Lab Results  Component Value Date   WBC 7.2 09/28/2020   HGB 14.3 09/28/2020   HCT 41.3 09/28/2020   MCV 93.3 09/28/2020   PLT 247.0 09/28/2020      Component Value Date/Time   NA 138 03/24/2021 1226   NA 138 09/04/2018 1537   K 4.1 03/24/2021 1226   CL 96 03/24/2021 1226   CO2 37 (H) 03/24/2021 1226   GLUCOSE 102 (H) 03/24/2021 1226   BUN 14 03/24/2021 1226   BUN 20 09/04/2018 1537   CREATININE 0.76 03/24/2021 1226   CALCIUM 10.8 (H) 03/24/2021 1226   PROT 7.3 03/24/2021 1226   ALBUMIN 4.7 03/24/2021 1226   AST 18 03/24/2021 1226   ALT 12 03/24/2021 1226    ALKPHOS 60 03/24/2021 1226   BILITOT 0.7 03/24/2021 1226   GFRNONAA 63 09/04/2018 1537   GFRAA 73 09/04/2018 1537   Lab Results  Component Value Date   CHOL 288 (H) 03/24/2021   HDL 81.20 03/24/2021   LDLCALC 184 (H) 03/24/2021   LDLDIRECT 119.8 04/26/2009   TRIG 116.0 03/24/2021   CHOLHDL 4 03/24/2021   Lab Results  Component Value Date   HGBA1C 6.4 03/24/2021   Lab Results  Component Value Date   VITAMINB12 461 06/18/2019   Lab Results  Component Value Date   TSH 2.65 02/24/2021      ASSESSMENT AND PLAN 86 y.o. year old female   1.  Dementia   -Decline in MMSE 19/30, was 26/30 in August 2022 -Has been very sensitive to medications, will try Namenda XR starting at 7 mg daily, I sent in enough for 1 month, we will plan to increase if she tolerates -She has had adverse effect to Exelon and Aricept in the past. -Have cautioned her husband to closely monitor her driving -Follow-up in 6 months or sooner if needed, will be followed by Dr. [name] since Dr. [name] has retired   Meds ordered this encounter  Medications   memantine (NAMENDA XR) 7 MG CP24 24 hr capsule    Sig: Take 1 capsule (7 mg total) by mouth daily.    Dispense:  30 capsule    Refill:  0   Butler Denmark, AGNP-C, DNP 06/30/2021, 10:37 AM [facility] [address], [place], [place] [address] [phone]  I reviewed the above note and documentation by the Nurse Practitioner and agree with the history, exam, assessment and plan as outlined above.  However, with a significant decline in her memory scores within 6 months, I would recommend that the patient at this point no longer drive.  If they wish  to seek formal evaluation of her driving, they can pursue this through several options:  1. [facility] [employer] [facility] in [place]: [phone]  2.   [facility]: [phone]  3.   [facility]: [phone]  5.   [facility]: [phone] or  [phone]   I was available for consultation.  Star Age, MD, PhD [facility])  Addendum 07/05/21 SS: I called her husband, mentioned again concern for driving, recommend based on memory score that she refrain from driving, will send in the mail, Driver's evaluation options. He is going to the drug store today to see about the Namenda XR, it might be too expensive, in that case, go back to regular Namenda.

## 2021-06-30 NOTE — Patient Instructions (Signed)
Stop the Namenda that you have  Start Namenda XR 7 mg at bedtime for few weeks, if you do well we can increase, I only sent in enough for 1 month Meds ordered this encounter  Medications   memantine (NAMENDA XR) 7 MG CP24 24 hr capsule    Sig: Take 1 capsule (7 mg total) by mouth daily.    Dispense:  30 capsule    Refill:  0

## 2021-07-06 ENCOUNTER — Other Ambulatory Visit: Payer: Self-pay | Admitting: Family Medicine

## 2021-07-08 ENCOUNTER — Other Ambulatory Visit: Payer: Self-pay | Admitting: Family Medicine

## 2021-08-01 ENCOUNTER — Telehealth: Payer: Self-pay | Admitting: Neurology

## 2021-08-01 NOTE — Telephone Encounter (Signed)
Pt's husband, Margerie Fraiser notifying NP that pt is doing good on memantine (NAMENDA XR) 7 MG CP24 24 hr capsule ?

## 2021-08-02 MED ORDER — MEMANTINE HCL ER 14 MG PO CP24: 14.0000 mg | ORAL_CAPSULE | Freq: Every day | ORAL | 5 refills | Status: DC

## 2021-08-02 NOTE — Addendum Note (Signed)
Addended by: Suzzanne Cloud on: 08/02/2021 03:59 PM ? ? Modules accepted: Orders ? ?

## 2021-08-02 NOTE — Telephone Encounter (Signed)
Pt called wanting to know if her memantine (NAMENDA XR) 7 MG CP24 24 hr capsule can be increased to the '10mg'$  again. States the memory is still fading and in the past she tried the '10mg'$  and she would like to try it again. Please advise. ?

## 2021-08-02 NOTE — Telephone Encounter (Signed)
I called patient. I advised her that Sarah, NP called in namenda XR '14mg'$  to [facility]. Patient will let us know of any questions or concerns. ?

## 2021-08-02 NOTE — Telephone Encounter (Signed)
Meds ordered this encounter  ?Medications  ? memantine (NAMENDA XR) 14 MG CP24 24 hr capsule  ?  Sig: Take 1 capsule (14 mg total) by mouth daily.  ?  Dispense:  30 capsule  ?  Refill:  5  ? ? ?

## 2021-08-02 NOTE — Telephone Encounter (Signed)
She was just seen by Judson Roch on 06/30/21. Notes: ? ?Has been very sensitive to medications, will try Namenda XR starting at 7 mg daily, I sent in enough for 1 month, we will plan to increase if she tolerates. ?_____________________________________ ?I spoke to the patient. She is doing well on Namenda XR '7mg'$  daily. She would like to try an increased dosage. For this medication, she uses [facility]. ?

## 2021-09-13 ENCOUNTER — Other Ambulatory Visit: Payer: Self-pay | Admitting: Family Medicine

## 2021-09-22 ENCOUNTER — Telehealth: Payer: Self-pay

## 2021-09-22 ENCOUNTER — Ambulatory Visit (INDEPENDENT_AMBULATORY_CARE_PROVIDER_SITE_OTHER): Payer: PPO | Admitting: Family Medicine

## 2021-09-22 VITALS — BP 120/80 | HR 58 | Ht 62.0 in | Wt 146.5 lb

## 2021-09-22 DIAGNOSIS — E1159 Type 2 diabetes mellitus with other circulatory complications: Secondary | ICD-10-CM

## 2021-09-22 DIAGNOSIS — I152 Hypertension secondary to endocrine disorders: Secondary | ICD-10-CM | POA: Diagnosis not present

## 2021-09-22 DIAGNOSIS — F03A0 Unspecified dementia, mild, without behavioral disturbance, psychotic disturbance, mood disturbance, and anxiety: Secondary | ICD-10-CM

## 2021-09-22 LAB — POCT GLYCOSYLATED HEMOGLOBIN (HGB A1C): Hemoglobin A1C: 6.3 % — AB (ref 4.0–5.6)

## 2021-09-22 NOTE — Progress Notes (Signed)
Error

## 2021-09-22 NOTE — Patient Instructions (Signed)
Get outside and try do sometime type of safe exercise and activity. ? Make appt if mood or energy is worsening. ?

## 2021-09-22 NOTE — Assessment & Plan Note (Signed)
Well controlled on losartan HCTZ 100/25 mg 1/2 tablet daily and metoprolol XL 50 mg 1/2 tablet daily. ?

## 2021-09-22 NOTE — Progress Notes (Signed)
Patient ID: Janet Chapman, female    DOB: [birth year], 86 y.o.   MRN: [id number]  This visit was conducted in person.  Ht '5\' 2"'$  (1.575 m)   Wt 146 lb 8 oz (66.5 kg)   BMI 26.80 kg/m    CC:  Chief Complaint  Patient presents with   Follow-up    6 month follow up , knot on head , hit her head a few months ago , no headaches, no dizziness , no blurred vision , coughing  runny nose     Subjective:   HPI: Janet Chapman is a 86 y.o. female presenting on 09/22/2021 for Follow-up (6 month follow up , knot on head , hit her head a few months ago , no headaches, no dizziness , no blurred vision , coughing  runny nose )  Diabetes:   Diet controlled  Lab Results  Component Value Date   HGBA1C 6.3 (A) 09/22/2021  Using medications without difficulties: Hypoglycemic episodes: Hyperglycemic episodes: Feet problems: no  ulcers Blood Sugars averaging: not checking. eye exam within last year Has appt for next month   Hypertension:   Well controlled on losartan HCTZ 100/25 mg 1/2 tablet daily and metoprolol XL 50 mg 1/2 tablet daily. Using medication without problems or lightheadedness: none Chest pain with exertion: none Edema: none Short of breath: none Average home BPs: 120/70s Other issues: Wt Readings from Last 3 Encounters:  09/22/21 146 lb 8 oz (66.5 kg)  06/30/21 141 lb (64 kg)  03/24/21 141 lb 8 oz (64.2 kg)   Several months ago she  hit her head on cabinet... resolved.   Now noted skin spot on scalp in last few months.. not related to hitting head.     Dementia: continues to worsen.. followed by neuro.. on memantine 10 reviewed last OV note 06/30/2021  Relevant past medical, surgical, family and social history reviewed and updated as indicated. Interim medical history since our last visit reviewed. Allergies and medications reviewed and updated. Outpatient Medications Prior to Visit  Medication Sig Dispense Refill   aspirin 81 MG tablet Take 81 mg by mouth at bedtime.      ketoconazole (NIZORAL) 2 % shampoo Apply 1 application topically once a week. 120 mL 0   losartan-hydrochlorothiazide (HYZAAR) 100-25 MG tablet TAKE 1/2 TABLET BY MOUTH DAILY 45 tablet 1   memantine (NAMENDA XR) 14 MG CP24 24 hr capsule Take 1 capsule (14 mg total) by mouth daily. 30 capsule 5   metoprolol succinate (TOPROL-XL) 50 MG 24 hr tablet TAKE 1/2 TABLET BY MOUTH DAILY 45 tablet 1   montelukast (SINGULAIR) 10 MG tablet TAKE 1 TABLET BY MOUTH AT BEDTIME 30 tablet 2   simvastatin (ZOCOR) 40 MG tablet Take 1 tablet (40 mg total) by mouth at bedtime. 90 tablet 3   triamcinolone cream (KENALOG) 0.5 % Apply 1 application topically 2 (two) times daily. As needed for itchy feet 30 g 0   No facility-administered medications prior to visit.     Per HPI unless specifically indicated in ROS section below Review of Systems  Constitutional:  Negative for chills and fever.  HENT:  Negative for congestion and ear pain.   Eyes:  Negative for pain and redness.  Respiratory:  Negative for cough and shortness of breath.   Cardiovascular:  Negative for chest pain, palpitations and leg swelling.  Gastrointestinal:  Negative for abdominal pain, blood in stool, constipation, diarrhea, nausea and vomiting.  Genitourinary:  Negative for dysuria.  Musculoskeletal:  Negative for myalgias.  Skin:  Negative for rash.  Neurological:  Negative for dizziness.  Psychiatric/Behavioral:  The patient is not nervous/anxious.    Objective:  Ht '5\' 2"'$  (1.575 m)   Wt 146 lb 8 oz (66.5 kg)   BMI 26.80 kg/m   Wt Readings from Last 3 Encounters:  09/22/21 146 lb 8 oz (66.5 kg)  06/30/21 141 lb (64 kg)  03/24/21 141 lb 8 oz (64.2 kg)      Physical Exam Constitutional:      General: She is not in acute distress.    Appearance: Normal appearance. She is well-developed. She is not ill-appearing or toxic-appearing.  HENT:     Head: Normocephalic.     Right Ear: Hearing, tympanic membrane, ear canal and external  ear normal. Tympanic membrane is not erythematous, retracted or bulging.     Left Ear: Hearing, tympanic membrane, ear canal and external ear normal. Tympanic membrane is not erythematous, retracted or bulging.     Nose: No mucosal edema or rhinorrhea.     Right Sinus: No maxillary sinus tenderness or frontal sinus tenderness.     Left Sinus: No maxillary sinus tenderness or frontal sinus tenderness.     Mouth/Throat:     Pharynx: Uvula midline.  Eyes:     General: Lids are normal. Lids are everted, no foreign bodies appreciated.     Conjunctiva/sclera: Conjunctivae normal.     Pupils: Pupils are equal, round, and reactive to light.  Neck:     Thyroid: No thyroid mass or thyromegaly.     Vascular: No carotid bruit.     Trachea: Trachea normal.  Cardiovascular:     Rate and Rhythm: Normal rate and regular rhythm.     Pulses: Normal pulses.     Heart sounds: Normal heart sounds, S1 normal and S2 normal. No murmur heard.    No friction rub. No gallop.  Pulmonary:     Effort: Pulmonary effort is normal. No tachypnea or respiratory distress.     Breath sounds: Normal breath sounds. No decreased breath sounds, wheezing, rhonchi or rales.  Abdominal:     General: Bowel sounds are normal.     Palpations: Abdomen is soft.     Tenderness: There is no abdominal tenderness.  Musculoskeletal:     Cervical back: Normal range of motion and neck supple.  Skin:    General: Skin is warm and dry.     Findings: No rash.  Neurological:     Mental Status: She is alert.     Cranial Nerves: Cranial nerves 2-12 are intact.     Sensory: Sensation is intact.     Motor: Motor function is intact.  Psychiatric:        Mood and Affect: Mood is not anxious or depressed.        Speech: Speech normal.        Behavior: Behavior normal. Behavior is cooperative.        Thought Content: Thought content normal.        Cognition and Memory: Cognition normal. She exhibits impaired recent memory.        Judgment:  Judgment normal.       Results for orders placed or performed in visit on 05/20/21  HM DEXA SCAN  Result Value Ref Range   HM Dexa Scan Osteopenia      COVID 19 screen:  No recent travel or known exposure to COVID19 The patient denies respiratory symptoms of COVID 19  at this time. The importance of social distancing was discussed today.   Assessment and Plan    Problem List Items Addressed This Visit     Hypertension associated with diabetes ([place]) (Chronic)    Well controlled on losartan HCTZ 100/25 mg 1/2 tablet daily and metoprolol XL 50 mg 1/2 tablet daily.      Type 2 diabetes mellitus with circulatory disorder HTN (HCC) - Primary (Chronic)    Well controlled with diet      Relevant Orders   POCT glycosylated hemoglobin (Hb A1C) (Completed)   Mild dementia without behavioral disturbance, psychotic disturbance, mood disturbance, or anxiety (HCC)       Dementia: continues to worsen.. followed by neuro.. on memantine 10 reviewed last OV note 06/30/2021         [name], MD

## 2021-09-22 NOTE — Assessment & Plan Note (Signed)
Well controlled with diet. 

## 2021-09-26 ENCOUNTER — Telehealth: Payer: Self-pay

## 2021-09-26 NOTE — Telephone Encounter (Signed)
I spoke with pt and pts husband (DPR signed); pt said on 09/22/21 in the evening pt started with nausea, dizziness where room spins, since then the nausea and dizziness comes and goes. Pts husband said this morning when pt first got up she felt better and got dressed and pts husband needed to take his car for repair and pt was going to go and suddenly pt said she did not feel good and layed back down. Mr Jeanlouis said pt has been laying this time for about 4 hours and when she got up did not have dizziness this time. Pt said she is still nauseated on and off, no appetite, pt is eating some peanut butter crackers. Pt does not have abd pain but does have a lot of gas and bloated feeling. Pt has not had BM since 09/22/21 after pt taking a laxative that day. Pt has not had fever, abd pain, no H/A, no CP or SOB. Pt does have dry mouth. Pt does not want to go to ED or UC. Pt said she will try to drink more water today and see how she does.UC & ED precautions given and pt and her husband voiced understanding and if pt condition worsens pt will go to ED for eval and possible IV fluids if needed. Pt or pts husband will cb on 09/27/21 with update at 8 AM if pt continues the same or gets better. Sending note to Dr [name] and Butch Penny CMA. ?

## 2021-09-26 NOTE — Telephone Encounter (Signed)
No changes were made at office visit.  Will send to triage to help determine next step. ?

## 2021-09-26 NOTE — Telephone Encounter (Signed)
Noted.Thank you for the info.

## 2021-09-26 NOTE — Telephone Encounter (Signed)
Husband called states patient was seen in office on Thursday. When she go home that night started feeling "sick". She has been having a lot of nausea no vomiting. She has been a lot of dizziness. She feels like it everything in spinning in circles. She has had decreased appetite. She is not drinking water only diet soda and tea. Feels like she has been bloated and a lot of gas. She has only had bowel moment on Thursday after taking laxative.  Denies any changes in medications. No one else in home sick.  ?

## 2021-09-27 ENCOUNTER — Encounter (HOSPITAL_COMMUNITY): Payer: Self-pay

## 2021-09-27 ENCOUNTER — Emergency Department (HOSPITAL_COMMUNITY)
Admission: EM | Admit: 2021-09-27 | Discharge: 2021-09-27 | Disposition: A | Discharge status: Home / Self Care | Payer: PPO | Attending: Emergency Medicine | Admitting: Emergency Medicine

## 2021-09-27 ENCOUNTER — Telehealth: Payer: Self-pay | Admitting: Family Medicine

## 2021-09-27 ENCOUNTER — Other Ambulatory Visit: Payer: Self-pay

## 2021-09-27 DIAGNOSIS — Z79899 Other long term (current) drug therapy: Secondary | ICD-10-CM | POA: Insufficient documentation

## 2021-09-27 DIAGNOSIS — R531 Weakness: Secondary | ICD-10-CM | POA: Diagnosis not present

## 2021-09-27 DIAGNOSIS — E1165 Type 2 diabetes mellitus with hyperglycemia: Secondary | ICD-10-CM | POA: Diagnosis not present

## 2021-09-27 DIAGNOSIS — I10 Essential (primary) hypertension: Secondary | ICD-10-CM | POA: Diagnosis not present

## 2021-09-27 DIAGNOSIS — F0390 Unspecified dementia without behavioral disturbance: Secondary | ICD-10-CM | POA: Insufficient documentation

## 2021-09-27 DIAGNOSIS — Z7982 Long term (current) use of aspirin: Secondary | ICD-10-CM | POA: Diagnosis not present

## 2021-09-27 DIAGNOSIS — E876 Hypokalemia: Secondary | ICD-10-CM | POA: Diagnosis not present

## 2021-09-27 DIAGNOSIS — R001 Bradycardia, unspecified: Secondary | ICD-10-CM | POA: Insufficient documentation

## 2021-09-27 DIAGNOSIS — R5383 Other fatigue: Secondary | ICD-10-CM | POA: Diagnosis not present

## 2021-09-27 DIAGNOSIS — R42 Dizziness and giddiness: Secondary | ICD-10-CM | POA: Insufficient documentation

## 2021-09-27 DIAGNOSIS — R638 Other symptoms and signs concerning food and fluid intake: Secondary | ICD-10-CM | POA: Insufficient documentation

## 2021-09-27 DIAGNOSIS — N3001 Acute cystitis with hematuria: Secondary | ICD-10-CM | POA: Diagnosis not present

## 2021-09-27 DIAGNOSIS — R112 Nausea with vomiting, unspecified: Secondary | ICD-10-CM | POA: Diagnosis not present

## 2021-09-27 LAB — URINALYSIS, ROUTINE W REFLEX MICROSCOPIC
Bilirubin Urine: NEGATIVE
Glucose, UA: NEGATIVE mg/dL
Ketones, ur: NEGATIVE mg/dL
Nitrite: POSITIVE — AB
Protein, ur: NEGATIVE mg/dL
Specific Gravity, Urine: 1.011 (ref 1.005–1.030)
pH: 5 (ref 5.0–8.0)

## 2021-09-27 LAB — MAGNESIUM: Magnesium: 2.1 mg/dL (ref 1.7–2.4)

## 2021-09-27 LAB — COMPREHENSIVE METABOLIC PANEL
ALT: 15 U/L (ref 0–44)
AST: 18 U/L (ref 15–41)
Albumin: 4.3 g/dL (ref 3.5–5.0)
Alkaline Phosphatase: 67 U/L (ref 38–126)
Anion gap: 10 (ref 5–15)
BUN: 12 mg/dL (ref 8–23)
CO2: 30 mmol/L (ref 22–32)
Calcium: 9.6 mg/dL (ref 8.9–10.3)
Chloride: 97 mmol/L — ABNORMAL LOW (ref 98–111)
Creatinine, Ser: 0.63 mg/dL (ref 0.44–1.00)
GFR, Estimated: >60 mL/min (ref >60)
Glucose, Bld: 148 mg/dL — ABNORMAL HIGH (ref 70–99)
Potassium: 2.8 mmol/L — ABNORMAL LOW (ref 3.5–5.1)
Sodium: 137 mmol/L (ref 135–145)
Total Bilirubin: 0.8 mg/dL (ref 0.3–1.2)
Total Protein: 7.2 g/dL (ref 6.5–8.1)

## 2021-09-27 LAB — CBC
HCT: 43.2 % (ref 36.0–46.0)
Hemoglobin: 15 g/dL (ref 12.0–15.0)
MCH: 32.5 pg (ref 26.0–34.0)
MCHC: 34.7 g/dL (ref 30.0–36.0)
MCV: 93.5 fL (ref 80.0–100.0)
Platelets: 205 10*3/uL (ref 150–400)
RBC: 4.62 MIL/uL (ref 3.87–5.11)
RDW: 13 % (ref 11.5–15.5)
WBC: 7.1 10*3/uL (ref 4.0–10.5)
nRBC: 0 % (ref 0.0–0.2)

## 2021-09-27 LAB — LIPASE, BLOOD: Lipase: 29 U/L (ref 11–51)

## 2021-09-27 MED ORDER — POTASSIUM CHLORIDE CRYS ER 20 MEQ PO TBCR
40.0000 meq | EXTENDED_RELEASE_TABLET | Freq: Once | ORAL | Status: AC
  Administered 2021-09-27: 40 meq via ORAL
  Filled 2021-09-27: qty 2

## 2021-09-27 MED ORDER — ONDANSETRON HCL 4 MG/2ML IJ SOLN
4.0000 mg | Freq: Once | INTRAMUSCULAR | Status: AC
  Administered 2021-09-27: 4 mg via INTRAVENOUS
  Filled 2021-09-27: qty 2

## 2021-09-27 MED ORDER — SODIUM CHLORIDE 0.9 % IV BOLUS
1000.0000 mL | Freq: Once | INTRAVENOUS | Status: AC
  Administered 2021-09-27: 1000 mL via INTRAVENOUS

## 2021-09-27 MED ORDER — ONDANSETRON HCL 4 MG PO TABS: 4.0000 mg | ORAL_TABLET | Freq: Four times a day (QID) | ORAL | 0 refills | Status: DC | PRN

## 2021-09-27 MED ORDER — POTASSIUM CHLORIDE CRYS ER 20 MEQ PO TBCR: 20.0000 meq | EXTENDED_RELEASE_TABLET | Freq: Two times a day (BID) | ORAL | 0 refills | Status: DC

## 2021-09-27 MED ORDER — CEPHALEXIN 500 MG PO CAPS: 500.0000 mg | ORAL_CAPSULE | Freq: Four times a day (QID) | ORAL | 0 refills | Status: DC

## 2021-09-27 MED ORDER — POTASSIUM CHLORIDE 10 MEQ/100ML IV SOLN
10.0000 meq | Freq: Once | INTRAVENOUS | Status: AC
  Administered 2021-09-27: 10 meq via INTRAVENOUS
  Filled 2021-09-27: qty 100

## 2021-09-27 MED ORDER — CEPHALEXIN 500 MG PO CAPS: 500.0000 mg | ORAL_CAPSULE | Freq: Two times a day (BID) | ORAL | 0 refills | Status: AC

## 2021-09-27 NOTE — Telephone Encounter (Signed)
Noted  

## 2021-09-27 NOTE — Discharge Instructions (Addendum)
It was a pleasure taking care of you today!  ? ?Your labs were unremarkable.  Your EKG was unremarkable.  Your urine was notable for a UTI. You will be sent a prescription for Keflex, take as prescribed. Ensure that you complete the entire course of antibiotic. Ensure to maintain fluid intake with water.  You may also use sugar-free Crystal light to add additional flavoring to your water so that when you drink it.  Your potassium was low in the ED, it was supplemented here in the emergency department.  Attached is information on low potassium and what foods he can eat to aid with your potassium levels.  You will also be sent a prescription for potassium supplements, take as prescribed.  It is important that you follow-up with your primary care provider within the next week to have your potassium levels rechecked.  You may follow-up with your primary care provider as needed.  Return to the emergency department if you are experiencing increasing or worsening abdominal pain, urinary symptoms, fever, or decreased fluid intake. ?

## 2021-09-27 NOTE — Telephone Encounter (Signed)
Rayburn Ma (Spouse) called and wanted to tell [name] and the nurse that he is taking Janet Chapman to the emergency room. Did not say what for. ? ?Callback Number: [phone] ?

## 2021-09-27 NOTE — Telephone Encounter (Signed)
Mr Chaudhari [facility] signed) calling and I spoke with him and pt; pt had an OK evening on 09/26/21. This morning pt got up and felt OK and got dressed and pt at part of 1/2 of croissant and then felt bad had nausea and dizziness. Pt also had small BM today. No vomiting but dizziness where room is spinning and pt went back to bed. Advised pt and her husband that pt needs to be seen to be evaluated. Pt does not feel like getting out of bed. Pt tried to drink couple of bottles of water yesterday. Pt and her husband will talk and decide if wants EMS to take pt to ED and will cb after talking. Sending update to Dr [name] and Butch Penny CMA and will teams Butch Penny. ?

## 2021-09-27 NOTE — ED Triage Notes (Signed)
Patient c/o nausea, dizziness, and weakness x 4 days. Patient's husband states, "She hasn't been drinking much because it makes her go to the bathroom to urinate." ?Patient's husband states she normally just drinks coffee and tea. Husband states they called her doctor yesterday and was told to drink more water. ?

## 2021-09-27 NOTE — ED Provider Notes (Signed)
[facility] DEPT Provider Note   CSN: [id number] Arrival date & time: 09/27/21  1057     History  Chief Complaint  Patient presents with   Nausea   Dizziness   Weakness    Janet Chapman is a 86 y.o. female with a PMHx of DM, HTN, dementia, who presents to the ED with concerns for nausea, lightheadedness, fatigue, generalized weakness onset 6 days.  Her husband notes that she has not been consuming much water because it makes her go to the bathroom to urinate however notes that she has been drinking other fluids as well.  Husband also notes that she has had a decrease in her food intake overall however that is not new.  Husband notes that they called the doctor yesterday and was suggested that the patient consume more water. Patient notes that her lightheadedness and nausea has resolved at this time. Denies fever, chills, chest pain, shortness of breath, abdominal pain, dysuria, hematuria, frequency, urgency, dizziness, vomiting.    The history is provided by the patient. No language interpreter was used.      Home Medications Prior to Admission medications   Medication Sig Start Date End Date Taking? Authorizing Provider  Apoaequorin (PREVAGEN PO) Take 1 capsule by mouth every morning.   Yes [provider]  aspirin EC 81 MG tablet Take 81 mg by mouth daily with supper. Swallow whole.   Yes [provider]  ketoconazole (NIZORAL) 2 % shampoo Apply 1 application topically once a week. Patient taking differently: Apply 1 application. topically every 14 (fourteen) days. 02/24/21  Yes Bedsole, Amy E, MD  losartan-hydrochlorothiazide (HYZAAR) 100-25 MG tablet TAKE 1/2 TABLET BY MOUTH DAILY Patient taking differently: Take 0.5 tablets by mouth daily with supper. 07/08/21  Yes Bedsole, Amy E, MD  memantine (NAMENDA XR) 14 MG CP24 24 hr capsule Take 1 capsule (14 mg total) by mouth daily. Patient taking differently: Take 14 mg by mouth every  morning. 08/02/21  Yes Suzzanne Cloud, NP  metoprolol succinate (TOPROL-XL) 50 MG 24 hr tablet TAKE 1/2 TABLET BY MOUTH DAILY Patient taking differently: 25 mg every morning. 07/06/21  Yes Bedsole, Amy E, MD  montelukast (SINGULAIR) 10 MG tablet TAKE 1 TABLET BY MOUTH AT BEDTIME Patient taking differently: Take 10 mg by mouth daily with supper. 09/13/21  Yes Bedsole, Amy E, MD  ondansetron (ZOFRAN) 4 MG tablet Take 1 tablet (4 mg total) by mouth every 6 (six) hours as needed for nausea or vomiting. 09/27/21  Yes Elohim Brune A, PA-C  potassium chloride SA (KLOR-CON M) 20 MEQ tablet Take 1 tablet (20 mEq total) by mouth 2 (two) times daily for 5 days. 09/27/21 10/02/21 Yes Briseis Aguilera A, PA-C  simvastatin (ZOCOR) 40 MG tablet Take 1 tablet (40 mg total) by mouth at bedtime. Patient taking differently: Take 40 mg by mouth daily with supper. 03/30/21  Yes Bedsole, Amy E, MD  cephALEXin (KEFLEX) 500 MG capsule Take 1 capsule (500 mg total) by mouth 2 (two) times daily for 5 days. 09/27/21 10/02/21  Audreana Hancox A, PA-C  triamcinolone cream (KENALOG) 0.5 % Apply 1 application topically 2 (two) times daily. As needed for itchy feet Patient not taking: Reported on 09/27/2021 03/24/21   [name], MD      Allergies    Codeine    Review of Systems   Review of Systems  Constitutional:  Positive for fatigue. Negative for chills and fever.  Respiratory:  Negative for shortness  of breath.   Cardiovascular:  Negative for chest pain.  Gastrointestinal:  Positive for nausea. Negative for abdominal pain and vomiting.  Genitourinary:  Negative for dysuria, frequency, hematuria and urgency.  Neurological:  Positive for weakness (generalized) and light-headedness (resolved). Negative for dizziness.  All other systems reviewed and are negative.  Physical Exam Updated Vital Signs BP (!) 146/63   Pulse (!) 49   Temp 97.8 F (36.6 C) (Oral)   Resp 16   Ht '5\' 2"'$  (1.575 m)   Wt 64.4 kg   SpO2 97%   BMI  25.97 kg/m  Physical Exam Vitals and nursing note reviewed.  Constitutional:      General: She is not in acute distress.    Appearance: She is not diaphoretic.  HENT:     Head: Normocephalic and atraumatic.     Mouth/Throat:     Pharynx: No oropharyngeal exudate.  Eyes:     General: No scleral icterus.    Conjunctiva/sclera: Conjunctivae normal.  Cardiovascular:     Rate and Rhythm: Regular rhythm. Bradycardia present.     Pulses: Normal pulses.     Heart sounds: Normal heart sounds.  Pulmonary:     Effort: Pulmonary effort is normal. No respiratory distress.     Breath sounds: Normal breath sounds. No wheezing.  Abdominal:     General: Bowel sounds are normal.     Palpations: Abdomen is soft. There is no mass.     Tenderness: There is no abdominal tenderness. There is no guarding or rebound.  Musculoskeletal:        General: Normal range of motion.     Cervical back: Normal range of motion and neck supple.  Skin:    General: Skin is warm and dry.  Neurological:     Mental Status: She is alert.  Psychiatric:        Behavior: Behavior normal.    ED Results / Procedures / Treatments   Labs (all labs ordered are listed, but only abnormal results are displayed) Labs Reviewed  COMPREHENSIVE METABOLIC PANEL - Abnormal; Notable for the following components:      Result Value   Potassium 2.8 (*)    Chloride 97 (*)    Glucose, Bld 148 (*)    All other components within normal limits  URINALYSIS, ROUTINE W REFLEX MICROSCOPIC - Abnormal; Notable for the following components:   APPearance HAZY (*)    Hgb urine dipstick MODERATE (*)    Nitrite POSITIVE (*)    Leukocytes,Ua SMALL (*)    Bacteria, UA RARE (*)    All other components within normal limits  LIPASE, BLOOD  CBC  MAGNESIUM    EKG EKG Interpretation  Date/Time:  Tuesday Sep 27 2021 14:08:02 EDT Ventricular Rate:  48 PR Interval:  141 QRS Duration: 91 QT Interval:  496 QTC Calculation: 444 R  Axis:   24 Text Interpretation: Sinus bradycardia Low voltage, precordial leads Probable anteroseptal infarct, old No acute changes No significant change since last tracing Confirmed by Varney Biles [phone]) on 09/27/2021 3:04:31 PM  Radiology No results found.  Procedures Procedures    Medications Ordered in ED Medications  ondansetron (ZOFRAN) injection 4 mg (4 mg Intravenous Given 09/27/21 1326)  sodium chloride 0.9 % bolus 1,000 mL (0 mLs Intravenous Stopped 09/27/21 1439)  potassium chloride SA (KLOR-CON M) CR tablet 40 mEq (40 mEq Oral Given 09/27/21 1326)  potassium chloride 10 mEq in 100 mL IVPB (0 mEq Intravenous Stopped 09/27/21 1439)  ED Course/ Medical Decision Making/ A&P Clinical Course as of 09/28/21 1354  Tue Sep 27, 2021  1238 Potassium(!): 2.8 Will replete [SB]  1438 Re-evaluated and patient noted improvement of her symptoms with treatment regimen in the ED. [SB]  1454 Patient reevaluated and discussed with patient urinalysis findings indicated of UTI.  We will ambulate the patient. [SB]  1604 Attending evaluated patient at bedside prior to discharge. [SB]    Clinical Course User Index [SB] Veryl Winemiller A, PA-C                           Medical Decision Making Amount and/or Complexity of Data Reviewed Labs: ordered. Decision-making details documented in ED Course.  Risk Prescription drug management.   Pt presents with nausea, lightheadedness, fatigue, generalized weakness onset 6 days. Patient notes that her lightheadedness and nausea has resolved at this time. Denies fever, chills, chest pain, shortness of breath, abdominal pain, dysuria, hematuria, frequency, urgency, dizziness, vomiting. Vital signs, pt afebrile, bradycardic (review of past records from past year note that patient is typically bradycardic). On exam, pt with no focal neurologic deficits. Bradycardic. No other acute cardiovascular, respiratory, abdominal exam findings. Differential diagnosis  includes anemia, arrhythmia, acute cystitis, GI etiology.   Additional history obtained:  Additional history obtained from Spouse/Significant Other  Labs:  I ordered, and personally interpreted labs.  The pertinent results include:   Lipase, CBC unremarkable. CMP with hypokalemia 2.8, slightly elevated glucose of 148 otherwise unremarkable Magnesium at 2.1 Urinalysis, hazy in appearance, moderate hemoglobin, nitrite positive, small amount of leukocytes  Medications:  I ordered medication including Zofran, potassium, IV fluids for symptom management Reevaluation of the patient after these medicines and interventions, I reevaluated the patient and found that they have improved I have reviewed the patients home medicines and have made adjustments as needed   Disposition: Presentation suspicious for acute cystitis.  Doubt anemia, arrhythmia, at this time. Case discussed with Attending who evaluated patient  agrees with discharge treatment plan. After consideration of the diagnostic results and the patients response to treatment, I feel that the patient would benefit from Discharge home. Discharged home with keflex, zofran, and potassium prescriptions. Discussed with patient importance of following up with her primary care provider in one week for potassium recheck. Supportive care measures and strict return precautions discussed with patient at bedside. Pt acknowledges and verbalizes understanding. Pt appears safe for discharge. Follow up as indicated in discharge paperwork.    This chart was dictated using voice recognition software, Dragon. Despite the best efforts of this provider to proofread and correct errors, errors may still occur which can change documentation meaning.  Final Clinical Impression(s) / ED Diagnoses Final diagnoses:  Acute cystitis with hematuria  Hypokalemia    Rx / DC Orders ED Discharge Orders          Ordered    cephALEXin (KEFLEX) 500 MG capsule  4 times  daily,   Status:  Discontinued        09/27/21 1558    ondansetron (ZOFRAN) 4 MG tablet  Every 6 hours PRN        09/27/21 1558    potassium chloride SA (KLOR-CON M) 20 MEQ tablet  2 times daily        09/27/21 1558    cephALEXin (KEFLEX) 500 MG capsule  2 times daily        09/27/21 1559  Sharlotte Baka A, PA-C 09/28/21 1354    Varney Biles, MD 10/06/21 [phone]

## 2021-10-06 ENCOUNTER — Inpatient Hospital Stay: Payer: PPO | Admitting: Family Medicine

## 2021-10-07 ENCOUNTER — Encounter: Payer: Self-pay | Admitting: Family Medicine

## 2021-10-07 ENCOUNTER — Ambulatory Visit (INDEPENDENT_AMBULATORY_CARE_PROVIDER_SITE_OTHER): Payer: PPO | Admitting: Family Medicine

## 2021-10-07 VITALS — BP 104/60 | HR 70 | Temp 98.1°F | Ht 62.0 in | Wt 147.1 lb

## 2021-10-07 DIAGNOSIS — N3001 Acute cystitis with hematuria: Secondary | ICD-10-CM | POA: Diagnosis not present

## 2021-10-07 DIAGNOSIS — R1084 Generalized abdominal pain: Secondary | ICD-10-CM | POA: Diagnosis not present

## 2021-10-07 DIAGNOSIS — R319 Hematuria, unspecified: Secondary | ICD-10-CM | POA: Diagnosis not present

## 2021-10-07 DIAGNOSIS — K5904 Chronic idiopathic constipation: Secondary | ICD-10-CM | POA: Diagnosis not present

## 2021-10-07 DIAGNOSIS — E876 Hypokalemia: Secondary | ICD-10-CM | POA: Diagnosis not present

## 2021-10-07 LAB — POC URINALSYSI DIPSTICK (AUTOMATED)
Bilirubin, UA: NEGATIVE
Glucose, UA: NEGATIVE
Ketones, UA: NEGATIVE
Leukocytes, UA: NEGATIVE
Nitrite, UA: NEGATIVE
Protein, UA: NEGATIVE
Spec Grav, UA: >=1.03 — AB (ref 1.010–1.025)
Urobilinogen, UA: 0.2 E.U./dL
pH, UA: 5.5 (ref 5.0–8.0)

## 2021-10-07 LAB — CBC WITH DIFFERENTIAL/PLATELET
Basophils Absolute: 0 10*3/uL (ref 0.0–0.1)
Basophils Relative: 0.3 % (ref 0.0–3.0)
Eosinophils Absolute: 0.1 10*3/uL (ref 0.0–0.7)
Eosinophils Relative: 1 % (ref 0.0–5.0)
HCT: 39.1 % (ref 36.0–46.0)
Hemoglobin: 13.3 g/dL (ref 12.0–15.0)
Lymphocytes Relative: 16.9 % (ref 12.0–46.0)
Lymphs Abs: 1.3 10*3/uL (ref 0.7–4.0)
MCHC: 34.1 g/dL (ref 30.0–36.0)
MCV: 94.1 fl (ref 78.0–100.0)
Monocytes Absolute: 0.7 10*3/uL (ref 0.1–1.0)
Monocytes Relative: 9.6 % (ref 3.0–12.0)
Neutro Abs: 5.5 10*3/uL (ref 1.4–7.7)
Neutrophils Relative %: 72.2 % (ref 43.0–77.0)
Platelets: 228 10*3/uL (ref 150.0–400.0)
RBC: 4.15 Mil/uL (ref 3.87–5.11)
RDW: 13.5 % (ref 11.5–15.5)
WBC: 7.6 10*3/uL (ref 4.0–10.5)

## 2021-10-07 LAB — BASIC METABOLIC PANEL
BUN: 13 mg/dL (ref 6–23)
CO2: 36 mEq/L — ABNORMAL HIGH (ref 19–32)
Calcium: 10.4 mg/dL (ref 8.4–10.5)
Chloride: 95 mEq/L — ABNORMAL LOW (ref 96–112)
Creatinine, Ser: 0.79 mg/dL (ref 0.40–1.20)
GFR: 68.99 mL/min (ref >60.00)
Glucose, Bld: 120 mg/dL — ABNORMAL HIGH (ref 70–99)
Potassium: 3.5 mEq/L (ref 3.5–5.1)
Sodium: 139 mEq/L (ref 135–145)

## 2021-10-07 LAB — POCT UA - MICROSCOPIC ONLY

## 2021-10-07 NOTE — Assessment & Plan Note (Signed)
Acute, possibly due to decreased p.o. intake given constipation issues. She is status post K-Dur 20 mill equivalents daily x5 days.  She has a increased potassium in her diet.  We will recheck her potassium and renal function with labs today.

## 2021-10-07 NOTE — Assessment & Plan Note (Signed)
Acute, most likely due to chronic constipation with acute worsening.  Her husband did note that she had a remote history of colitis but she denies fever and pain is only minimal today.  On exam there may have been some mild rebound tenderness.  I will check a CBC to determine if the white blood cells are elevated or if there is any sign of anemia.

## 2021-10-07 NOTE — Patient Instructions (Addendum)
Take a dose of Milk on magnesia today.   Start today Miralax 17 g twice daily... decrease to once daily once BMs regular.  If not improving, can use a salt water enema like Fleets.  Continue metamucil for fiber.  And increase water intake.  Keep up  with physical activity.  Call next week if not having a regular BM.  Please stop at the lab to have labs drawn.

## 2021-10-07 NOTE — Progress Notes (Signed)
Patient ID: Janet Chapman, female    DOB: 24-Apr-1936, 85 y.o.   MRN: [id number]  This visit was conducted in person.  BP 104/60   Pulse 70   Temp 98.1 F (36.7 C) (Oral)   Ht '5\' 2"'$  (1.575 m)   Wt 147 lb 1 oz (66.7 kg)   SpO2 96%   BMI 26.90 kg/m    CC:  Chief Complaint  Patient presents with   Hospitalization Follow-up   Abdominal Pain    Gas and Constipation    Subjective:   HPI: Janet Chapman is a 86 y.o. female presenting on 10/07/2021 for Hospitalization Follow-up and Abdominal Pain (Gas and Constipation)  Seen in ED on 5/16 for acute cystitis (per UA, no culture sent) and hypokalemia  Causing nausea , dizziness and weakness  Treated with  Cephalexin 500 mg TID  Potassium  found to be low at 2.8.Marland Kitchen started on KDur 20 mEq BID x 5 days   Mg was normal. ( She is on losartan HCTZ)  She started feeling better but dizziness continued.  Was using meclizine off and on.. now no longer needing.  She was in the office today along with her husband given her dementia.  He reported that her nausea dizziness and weakness was directly following a spell of worsening constipation.  She is having bloating and small stools, Constant feeling like she have have BM. Abdominal pressure.   Using dulcolax 3 times in 2.5 week. She is using Metamucil fiber supplement.  She drinks minimal water    BP Readings from Last 3 Encounters:  10/07/21 104/60  09/27/21 (!) 147/56  09/22/21 120/80     Relevant past medical, surgical, family and social history reviewed and updated as indicated. Interim medical history since our last visit reviewed. Allergies and medications reviewed and updated. Outpatient Medications Prior to Visit  Medication Sig Dispense Refill   Apoaequorin (PREVAGEN PO) Take 1 capsule by mouth every morning.     aspirin EC 81 MG tablet Take 81 mg by mouth daily with supper. Swallow whole.     ketoconazole (NIZORAL) 2 % shampoo Apply 1 application. topically every 14  (fourteen) days.     losartan-hydrochlorothiazide (HYZAAR) 100-25 MG tablet TAKE 1/2 TABLET BY MOUTH DAILY 45 tablet 1   memantine (NAMENDA XR) 14 MG CP24 24 hr capsule Take 1 capsule (14 mg total) by mouth daily. 30 capsule 5   metoprolol succinate (TOPROL-XL) 50 MG 24 hr tablet TAKE 1/2 TABLET BY MOUTH DAILY 45 tablet 1   montelukast (SINGULAIR) 10 MG tablet TAKE 1 TABLET BY MOUTH AT BEDTIME 30 tablet 2   ondansetron (ZOFRAN) 4 MG tablet Take 1 tablet (4 mg total) by mouth every 6 (six) hours as needed for nausea or vomiting. 6 tablet 0   potassium chloride SA (KLOR-CON M) 20 MEQ tablet Take 1 tablet (20 mEq total) by mouth 2 (two) times daily for 5 days. 10 tablet 0   simvastatin (ZOCOR) 40 MG tablet Take 1 tablet (40 mg total) by mouth at bedtime. 90 tablet 3   ketoconazole (NIZORAL) 2 % shampoo Apply 1 application topically once a week. (Patient taking differently: Apply 1 application. topically.) 120 mL 0   triamcinolone cream (KENALOG) 0.5 % Apply 1 application topically 2 (two) times daily. As needed for itchy feet (Patient not taking: Reported on 09/27/2021) 30 g 0   No facility-administered medications prior to visit.     Per HPI unless specifically indicated in ROS section  below Review of Systems  Constitutional:  Negative for fatigue and fever.  HENT:  Negative for congestion.   Eyes:  Negative for pain.  Respiratory:  Negative for cough and shortness of breath.   Cardiovascular:  Negative for chest pain, palpitations and leg swelling.  Gastrointestinal:  Positive for constipation and nausea. Negative for abdominal pain, blood in stool, rectal pain and vomiting.  Genitourinary:  Negative for dysuria and vaginal bleeding.  Musculoskeletal:  Negative for back pain.  Neurological:  Negative for syncope, light-headedness and headaches.  Psychiatric/Behavioral:  Negative for dysphoric mood.   Objective:  BP 104/60   Pulse 70   Temp 98.1 F (36.7 C) (Oral)   Ht '5\' 2"'$  (1.575 m)    Wt 147 lb 1 oz (66.7 kg)   SpO2 96%   BMI 26.90 kg/m   Wt Readings from Last 3 Encounters:  10/07/21 147 lb 1 oz (66.7 kg)  09/27/21 142 lb (64.4 kg)  09/22/21 146 lb 8 oz (66.5 kg)      Physical Exam Constitutional:      General: She is not in acute distress.    Appearance: Normal appearance. She is well-developed. She is not ill-appearing or toxic-appearing.  HENT:     Head: Normocephalic.     Right Ear: Hearing, tympanic membrane, ear canal and external ear normal. Tympanic membrane is not erythematous, retracted or bulging.     Left Ear: Hearing, tympanic membrane, ear canal and external ear normal. Tympanic membrane is not erythematous, retracted or bulging.     Nose: No mucosal edema or rhinorrhea.     Right Sinus: No maxillary sinus tenderness or frontal sinus tenderness.     Left Sinus: No maxillary sinus tenderness or frontal sinus tenderness.     Mouth/Throat:     Pharynx: Uvula midline.  Eyes:     General: Lids are normal. Lids are everted, no foreign bodies appreciated.     Conjunctiva/sclera: Conjunctivae normal.     Pupils: Pupils are equal, round, and reactive to light.  Neck:     Thyroid: No thyroid mass or thyromegaly.     Vascular: No carotid bruit.     Trachea: Trachea normal.  Cardiovascular:     Rate and Rhythm: Normal rate and regular rhythm.     Pulses: Normal pulses.     Heart sounds: Normal heart sounds, S1 normal and S2 normal. No murmur heard.   No friction rub. No gallop.  Pulmonary:     Effort: Pulmonary effort is normal. No tachypnea or respiratory distress.     Breath sounds: Normal breath sounds. No decreased breath sounds, wheezing, rhonchi or rales.  Abdominal:     General: Bowel sounds are normal.     Palpations: Abdomen is soft.     Tenderness: There is no abdominal tenderness.  Musculoskeletal:     Cervical back: Normal range of motion and neck supple.  Skin:    General: Skin is warm and dry.     Findings: No rash.  Neurological:      Mental Status: She is alert.     Cranial Nerves: Cranial nerves 2-12 are intact.     Sensory: Sensation is intact.     Coordination: Coordination is intact.     Gait: Gait is intact.  Psychiatric:        Mood and Affect: Mood is not anxious or depressed.        Speech: Speech normal.        Behavior: Behavior normal.  Behavior is cooperative.        Thought Content: Thought content normal.        Cognition and Memory: Cognition is impaired. Memory is impaired.        Judgment: Judgment normal.      Results for orders placed or performed during the hospital encounter of 09/27/21  Lipase, blood  Result Value Ref Range   Lipase 29 11 - 51 U/L  Comprehensive metabolic panel  Result Value Ref Range   Sodium 137 135 - 145 mmol/L   Potassium 2.8 (L) 3.5 - 5.1 mmol/L   Chloride 97 (L) 98 - 111 mmol/L   CO2 30 22 - 32 mmol/L   Glucose, Bld 148 (H) 70 - 99 mg/dL   BUN 12 8 - 23 mg/dL   Creatinine, Ser 0.63 0.44 - 1.00 mg/dL   Calcium 9.6 8.9 - 10.3 mg/dL   Total Protein 7.2 6.5 - 8.1 g/dL   Albumin 4.3 3.5 - 5.0 g/dL   AST 18 15 - 41 U/L   ALT 15 0 - 44 U/L   Alkaline Phosphatase 67 38 - 126 U/L   Total Bilirubin 0.8 0.3 - 1.2 mg/dL   GFR, Estimated >60 >60 mL/min   Anion gap 10 5 - 15  CBC  Result Value Ref Range   WBC 7.1 4.0 - 10.5 K/uL   RBC 4.62 3.87 - 5.11 MIL/uL   Hemoglobin 15.0 12.0 - 15.0 g/dL   HCT 43.2 36.0 - 46.0 %   MCV 93.5 80.0 - 100.0 fL   MCH 32.5 26.0 - 34.0 pg   MCHC 34.7 30.0 - 36.0 g/dL   RDW 13.0 11.5 - 15.5 %   Platelets 205 150 - 400 K/uL   nRBC 0.0 0.0 - 0.2 %  Urinalysis, Routine w reflex microscopic Urine, Clean Catch  Result Value Ref Range   Color, Urine YELLOW YELLOW   APPearance HAZY (A) CLEAR   Specific Gravity, Urine 1.011 1.005 - 1.030   pH 5.0 5.0 - 8.0   Glucose, UA NEGATIVE NEGATIVE mg/dL   Hgb urine dipstick MODERATE (A) NEGATIVE   Bilirubin Urine NEGATIVE NEGATIVE   Ketones, ur NEGATIVE NEGATIVE mg/dL   Protein, ur NEGATIVE  NEGATIVE mg/dL   Nitrite POSITIVE (A) NEGATIVE   Leukocytes,Ua SMALL (A) NEGATIVE   RBC / HPF 6-10 0 - 5 RBC/hpf   WBC, UA 21-50 0 - 5 WBC/hpf   Bacteria, UA RARE (A) NONE SEEN   Squamous Epithelial / LPF 6-10 0 - 5   Mucus PRESENT   Magnesium  Result Value Ref Range   Magnesium 2.1 1.7 - 2.4 mg/dL     COVID 19 screen:  No recent travel or known exposure to COVID19 The patient denies respiratory symptoms of COVID 19 at this time. The importance of social distancing was discussed today.   Assessment and Plan Problem List Items Addressed This Visit     Chronic idiopathic constipation    Chronic, with acute worsening  She will continue Metamucil fiber and her husband will increase her water intake.  She will restart MiraLAX 17 g twice daily.  She will take 1 dose of milk of magnesia today to stimulate bowel movement.  If she continues to not have a bowel movement she can take an additional enema treatment.  She was offered rectal evaluation with this impaction today but has opted against this.       Generalized abdominal pain    Acute, most likely due to chronic  constipation with acute worsening.  Her husband did note that she had a remote history of colitis but she denies fever and pain is only minimal today.  On exam there may have been some mild rebound tenderness.  I will check a CBC to determine if the white blood cells are elevated or if there is any sign of anemia.       Relevant Orders   CBC with Differential/Platelet (Completed)   Hematuria    She was treated with cephalexin 500 mg 3 times daily for a possible urinary tract infection although this was not culture proven.  She is not currently having any urinary tract symptoms.  On repeat urinalysis today there was evidence of skin contamination, no sign of infection but there was persistent for microscopic hematuria.  On the microscopic examination there were no casts seen suggesting acute renal issue.  We will send the  urine for culture although I am hesitant given there is likely contaminant.  If no pathologic bacteria is seen we will move forward with evaluation through urology.       Relevant Orders   POCT UA - Microscopic Only   Hypokalemia - Primary    Acute, possibly due to decreased p.o. intake given constipation issues. She is status post K-Dur 20 mill equivalents daily x5 days.  She has a increased potassium in her diet.  We will recheck her potassium and renal function with labs today.       Relevant Orders   Basic Metabolic Panel (Completed)   Other Visit Diagnoses     Acute cystitis with hematuria       Relevant Orders   POCT Urinalysis Dipstick (Automated) (Completed)   Urine Culture      Orders Placed This Encounter  Procedures   Urine Culture   Basic Metabolic Panel   CBC with Differential/Platelet   POCT Urinalysis Dipstick (Automated)   POCT UA - Microscopic Only       [name], MD

## 2021-10-07 NOTE — Assessment & Plan Note (Signed)
Chronic, with acute worsening  She will continue Metamucil fiber and her husband will increase her water intake.  She will restart MiraLAX 17 g twice daily.  She will take 1 dose of milk of magnesia today to stimulate bowel movement.  If she continues to not have a bowel movement she can take an additional enema treatment.  She was offered rectal evaluation with this impaction today but has opted against this.

## 2021-10-07 NOTE — Assessment & Plan Note (Signed)
She was treated with cephalexin 500 mg 3 times daily for a possible urinary tract infection although this was not culture proven.  She is not currently having any urinary tract symptoms.  On repeat urinalysis today there was evidence of skin contamination, no sign of infection but there was persistent for microscopic hematuria.  On the microscopic examination there were no casts seen suggesting acute renal issue.  We will send the urine for culture although I am hesitant given there is likely contaminant.  If no pathologic bacteria is seen we will move forward with evaluation through urology.

## 2021-10-09 LAB — URINE CULTURE
MICRO NUMBER:: 13450837
SPECIMEN QUALITY:: ADEQUATE

## 2021-10-11 ENCOUNTER — Other Ambulatory Visit: Payer: Self-pay | Admitting: Family Medicine

## 2021-10-11 MED ORDER — NITROFURANTOIN MONOHYD MACRO 100 MG PO CAPS: 100.0000 mg | ORAL_CAPSULE | Freq: Two times a day (BID) | ORAL | 0 refills | Status: DC

## 2021-10-11 NOTE — Progress Notes (Signed)
Call  There was some enterococcus that grew in the urine.  I will send in a prescription for nitrofurantoin to the pharmacy for her to start.

## 2021-10-26 NOTE — Assessment & Plan Note (Signed)
    Dementia: continues to worsen.. followed by neuro.. on memantine 10 reviewed last OV note 06/30/2021

## 2021-12-14 ENCOUNTER — Telehealth: Payer: Self-pay | Admitting: Family Medicine

## 2021-12-14 MED ORDER — METOPROLOL SUCCINATE ER 50 MG PO TB24: 25.0000 mg | ORAL_TABLET | Freq: Every day | ORAL | 1 refills | Status: DC

## 2021-12-14 NOTE — Telephone Encounter (Signed)
Refill sent as requested. 

## 2021-12-14 NOTE — Telephone Encounter (Signed)
Caller Name: Randall Hiss Gibs(Husband) Call back phone #: [phone]  MEDICATION(S): metoprolol succinate (TOPROL-XL) 50 MG 24 hr tablet  Days of Med Remaining: none left  Has the patient contacted their pharmacy (YES/NO)? yes What did pharmacy advise? He said the pharmacy said they have tried for the last couple of days and havent heard anything from [place]  Preferred Pharmacy: [facility]  ~~~Please advise patient/caregiver to allow 2-3 business days to process RX refills.

## 2022-01-04 ENCOUNTER — Encounter: Payer: Self-pay | Admitting: Neurology

## 2022-01-04 ENCOUNTER — Ambulatory Visit: Payer: HMO | Admitting: Neurology

## 2022-01-04 VITALS — BP 115/63 | HR 60 | Ht 62.0 in | Wt 141.3 lb

## 2022-01-04 DIAGNOSIS — G301 Alzheimer's disease with late onset: Secondary | ICD-10-CM

## 2022-01-04 DIAGNOSIS — F02B0 Dementia in other diseases classified elsewhere, moderate, without behavioral disturbance, psychotic disturbance, mood disturbance, and anxiety: Secondary | ICD-10-CM | POA: Diagnosis not present

## 2022-01-04 MED ORDER — MEMANTINE HCL ER 21 MG PO CP24: 21.0000 mg | ORAL_CAPSULE | Freq: Every day | ORAL | 2 refills | Status: DC

## 2022-01-04 NOTE — Progress Notes (Signed)
PATIENT: Janet Chapman DOB: [birth year]  REASON FOR VISIT: Follow up for memory HISTORY FROM: Patient, husband PRIMARY NEUROLOGIST: Dr. [name] now will be followed by Dr. [name]  HISTORY OF PRESENT ILLNESS: Today 01/04/22 Janet Chapman is here today for follow-up.  Is taking Namenda XR 14 mg daily, no side effects.  Tolerating well. MMSE 23/30 today. Still short term memory trouble. Does her own ADLs.  She cooks and cleans. She likes to eat. No falls. Had UTI 2 months ago. Takes Miralax every 3rd day. Sleeps fair, sometimes can't fall asleep. Weight is same. Enjoys cleaning. Watches TV. Drives short distances, denies issues. Mood is good. Does repeat herself today.   Update 06/30/21 SS: Janet Chapman here today for follow-up with history of memory trouble.  Has been unable to tolerate memory medications well, is able to tolerate namenda 5 mg twice daily, but has to take with supper and then midnight. She thinks main issue is repeating herself. Does her own ADLs, drives, does housework, goes to church. For a few months they stopped namenda because of dizziness, her memory "nose dived". Good improvement when she went back on it. MMSE 19/30 today was 26/30 in August 2022. Here today with her husband.  HISTORY  12/15/2020 MM: Janet Chapman is an 86 year old female with a history of memory disturbance.  She returns today for follow-up.  The patient has tried multiple medications in the past but has not been able to tolerate them.  These medications include Exelon, Aricept and Namenda.  She feels that her memory has remained stable.  She lives at home with her husband.  She is able to complete all ADLs independently.  She continues to complete all household chores.  She is able to drive to familiar places.  Denies any trouble sleeping.  She manages her own medications and appointments.  She also manages the finances without difficulty.  She returns today for an evaluation.  REVIEW OF SYSTEMS: Out of a complete 14  system review of symptoms, the patient complains only of the following symptoms, and all other reviewed systems are negative.  See HPI  ALLERGIES: Allergies  Allergen Reactions   Codeine Nausea Only    HOME MEDICATIONS: Outpatient Medications Prior to Visit  Medication Sig Dispense Refill   Apoaequorin (PREVAGEN PO) Take 1 capsule by mouth every morning.     aspirin EC 81 MG tablet Take 81 mg by mouth daily with supper. Swallow whole.     ketoconazole (NIZORAL) 2 % shampoo Apply 1 application. topically every 14 (fourteen) days.     losartan-hydrochlorothiazide (HYZAAR) 100-25 MG tablet TAKE 1/2 TABLET BY MOUTH DAILY 45 tablet 1   metoprolol succinate (TOPROL-XL) 50 MG 24 hr tablet Take 0.5 tablets (25 mg total) by mouth daily. Take with or immediately following a meal. 45 tablet 1   montelukast (SINGULAIR) 10 MG tablet TAKE 1 TABLET BY MOUTH AT BEDTIME 30 tablet 2   potassium chloride SA (KLOR-CON M) 20 MEQ tablet Take 1 tablet (20 mEq total) by mouth 2 (two) times daily for 5 days. 10 tablet 0   simvastatin (ZOCOR) 40 MG tablet Take 1 tablet (40 mg total) by mouth at bedtime. 90 tablet 3   memantine (NAMENDA XR) 14 MG CP24 24 hr capsule Take 1 capsule (14 mg total) by mouth daily. 30 capsule 5   nitrofurantoin, macrocrystal-monohydrate, (MACROBID) 100 MG capsule Take 1 capsule (100 mg total) by mouth 2 (two) times daily. 10 capsule 0   ondansetron (ZOFRAN) 4  MG tablet Take 1 tablet (4 mg total) by mouth every 6 (six) hours as needed for nausea or vomiting. 6 tablet 0   No facility-administered medications prior to visit.    PAST MEDICAL HISTORY: Past Medical History:  Diagnosis Date   Allergic rhinitis    Diabetes mellitus without complication ([place])    diet controlled   GERD (gastroesophageal reflux disease)    Hyperlipidemia    Hypertension    Memory loss    Osteoarthritis    Urinary incontinence     PAST SURGICAL HISTORY: Past Surgical History:  Procedure Laterality  Date   ABDOMINAL HYSTERECTOMY  1993   TA   CARDIOVASCULAR STRESS TEST  2004   H. [name], cardiolite-normal   CHOLECYSTECTOMY  7322'G   COLONOSCOPY     LEFT HEART CATH AND CORONARY ANGIOGRAPHY N/A 11/06/2017   Procedure: LEFT HEART CATH AND CORONARY ANGIOGRAPHY;  Surgeon: Burnell Blanks, MD;  Location: [facility];  Service: Cardiovascular;  Laterality: N/A;    FAMILY HISTORY: Family History  Problem Relation Age of Onset   Heart attack Father 79   Dementia Mother    Stroke Mother 89   Ovarian cancer Sister 96   Diabetes Other        Maternal side   Ovarian cancer Other        Grandmother   Ovarian cancer Other        aunt   Colon cancer Neg Hx     SOCIAL HISTORY: Social History   Socioeconomic History   Marital status: Married    Spouse name: Not on file   Number of children: 0   Years of education: Not on file   Highest education level: Not on file  Occupational History   Occupation: retired  Tobacco Use   Smoking status: Never   Smokeless tobacco: Never   Tobacco comments:    In college  Vaping Use   Vaping Use: Never used  Substance and Sexual Activity   Alcohol use: Not Currently   Drug use: No   Sexual activity: Never  Other Topics Concern   Not on file  Social History Narrative    Regular exercise: yes, Curves 2x a week   Married 48 + years    Diet: fruit and veggies, no FF, some water, artificial sweetener   HCPOA: [name], has living will, full code ( reviewed 2014)            Social Determinants of Health   Financial Resource Strain: Low Risk  (03/18/2021)   Overall Financial Resource Strain (CARDIA)    Difficulty of Paying Living Expenses: Not hard at all  Food Insecurity: No Food Insecurity (03/18/2021)   Hunger Vital Sign    Worried About Running Out of Food in the Last Year: Never true    [place] in the Last Year: Never true  Transportation Needs: No Transportation Needs (03/18/2021)   PRAPARE - Armed forces logistics/support/administrative officer (Medical): No    Lack of Transportation (Non-Medical): No  Physical Activity: Insufficiently Active (03/18/2021)   Exercise Vital Sign    Days of Exercise per Week: 7 days    Minutes of Exercise per Session: 20 min  Stress: No Stress Concern Present (03/18/2021)   [place]    Feeling of Stress : Not at all  Social Connections: Moderately Integrated (03/18/2021)   Social Connection and Isolation Panel [NHANES]  Frequency of Communication with Friends and Family: Never    Frequency of Social Gatherings with Friends and Family: Once a week    Attends Religious Services: More than 4 times per year    Active Member of [employer] or Organizations: Yes    Attends Music therapist: More than 4 times per year    Marital Status: Married  Human resources officer Violence: Not At Risk (03/18/2021)   Humiliation, Afraid, Rape, and Kick questionnaire    Fear of Current or Ex-Partner: No    Emotionally Abused: No    Physically Abused: No    Sexually Abused: No   PHYSICAL EXAM  Vitals:   01/04/22 1308  BP: 115/63  Pulse: 60  Weight: 141 lb 5 oz (64.1 kg)  Height: '5\' 2"'$  (1.575 m)    Body mass index is 25.85 kg/m.  Generalized: Well developed, in no acute distress, well dressed, put together     01/04/2022    1:17 PM 06/30/2021   10:33 AM 12/15/2020   11:18 AM  MMSE - Mini Mental State Exam  Orientation to time '3 3 5  '$ Orientation to Place '4 3 5  '$ Registration '3 3 3  '$ Attention/ Calculation '4 1 5  '$ Attention/Calculation-comments   spelled world  Recall 0 0 0  Language- name 2 objects '2 2 2  '$ Language- repeat '1 1 1  '$ Language- follow 3 step command '3 3 3  '$ Language- read & follow direction '1 1 1  '$ Write a sentence '1 1 1  '$ Copy design 1 1 0  Total score '23 19 26   '$ Neurological examination  Mentation: Alert oriented to time, place, history taking. Follows all commands speech and language fluent.   Does repeat herself. Cranial nerve II-XII: Pupils were equal round reactive to light. Extraocular movements were full, visual field were full on confrontational test. Facial sensation and strength were normal. Head turning and shoulder shrug  were normal and symmetric. Motor: The motor testing reveals 5 over 5 strength of all 4 extremities. Good symmetric motor tone is noted throughout.  Sensory: Sensory testing is intact to soft touch on all 4 extremities. No evidence of extinction is noted.  Coordination: Cerebellar testing reveals good finger-nose-finger and heel-to-shin bilaterally.  Gait and station: Gait is normal. Reflexes: Deep tendon reflexes are symmetric but decreased  DIAGNOSTIC DATA (LABS, IMAGING, TESTING) - I reviewed patient records, labs, notes, testing and imaging myself where available.  Lab Results  Component Value Date   WBC 7.6 10/07/2021   HGB 13.3 10/07/2021   HCT 39.1 10/07/2021   MCV 94.1 10/07/2021   PLT 228.0 10/07/2021      Component Value Date/Time   NA 139 10/07/2021 1152   NA 138 09/04/2018 1537   K 3.5 10/07/2021 1152   CL 95 (L) 10/07/2021 1152   CO2 36 (H) 10/07/2021 1152   GLUCOSE 120 (H) 10/07/2021 1152   BUN 13 10/07/2021 1152   BUN 20 09/04/2018 1537   CREATININE 0.79 10/07/2021 1152   CALCIUM 10.4 10/07/2021 1152   PROT 7.2 09/27/2021 1135   ALBUMIN 4.3 09/27/2021 1135   AST 18 09/27/2021 1135   ALT 15 09/27/2021 1135   ALKPHOS 67 09/27/2021 1135   BILITOT 0.8 09/27/2021 1135   GFRNONAA >60 09/27/2021 1135   GFRAA 73 09/04/2018 1537   Lab Results  Component Value Date   CHOL 288 (H) 03/24/2021   HDL 81.20 03/24/2021   LDLCALC 184 (H) 03/24/2021   LDLDIRECT 119.8 04/26/2009  TRIG 116.0 03/24/2021   CHOLHDL 4 03/24/2021   Lab Results  Component Value Date   HGBA1C 6.3 (A) 09/22/2021   Lab Results  Component Value Date   VITAMINB12 461 06/18/2019   Lab Results  Component Value Date   TSH 2.65 02/24/2021   ASSESSMENT  AND PLAN 86 y.o. year old female   1.  Dementia   -Seems stable, a little better score wise, MMSE 23/30 -Increase Namenda XR 21 mg daily -Have recommended against driving -Encouraged to implement brain stimulating exercises, consistent physical activity -I refilled Namenda for 3 months, if she does well with 21 mg dosage, we can increase to 28 mg -Follow up with me in 6 months  Evangeline Dakin, [facility] [address], [place], [place] [address] [phone]

## 2022-01-04 NOTE — Patient Instructions (Signed)
Meds ordered this encounter  Medications   memantine (NAMENDA XR) 21 MG CP24 24 hr capsule    Sig: Take 1 capsule (21 mg total) by mouth daily.    Dispense:  30 capsule    Refill:  2   We will increase the Namenda, if in 3 months you are tolerating well, we will increase the dose again Recommend against driving  See you back in 6 months

## 2022-01-24 ENCOUNTER — Other Ambulatory Visit: Payer: Self-pay | Admitting: Family Medicine

## 2022-02-09 ENCOUNTER — Telehealth: Payer: Self-pay | Admitting: Neurology

## 2022-02-09 MED ORDER — MEMANTINE HCL ER 14 MG PO CP24
14.0000 mg | ORAL_CAPSULE | Freq: Every day | ORAL | 5 refills | Status: DC
Start: 2022-02-09 — End: 2022-03-31

## 2022-02-09 NOTE — Telephone Encounter (Signed)
Pt's husband called stating that the pt's memantine (NAMENDA XR) 21 MG CP24 24 hr capsule was increased and after a couple of weeks on the increased dosage the pt started experiencing dizziness. She stopped taking it for a day and did not experience dizziness when she started taking it again the dizziness came back. Please advise.

## 2022-02-09 NOTE — Addendum Note (Signed)
Addended by: Verlin Grills on: 02/09/2022 05:04 PM   Modules accepted: Orders

## 2022-02-09 NOTE — Telephone Encounter (Signed)
I called the pt's husband back. He reports over the last week ongoing dizziness. He sts the first three weeks after starting the 21 mg she tolerated fine but into week 4 dizziness started. Since then, she has skipped a day dizziness improves but once she resumes the dizziness is right back.   Husband states when she was taking the 14 mg and 10 mg of Memantine, she tolerated well with no issues.  Will discuss with Judson Roch on next steps.

## 2022-02-09 NOTE — Telephone Encounter (Signed)
Spoke with Judson Roch verbally on this, she is agreeable to decreasing med to 14 mg of Namenda instead of 21.  I have sent to [facility] for the pt.

## 2022-02-28 ENCOUNTER — Ambulatory Visit (INDEPENDENT_AMBULATORY_CARE_PROVIDER_SITE_OTHER): Payer: HMO | Admitting: Family Medicine

## 2022-02-28 ENCOUNTER — Encounter: Payer: Self-pay | Admitting: Family Medicine

## 2022-02-28 VITALS — BP 120/80 | HR 64 | Temp 97.8°F | Ht 62.5 in | Wt 141.2 lb

## 2022-02-28 DIAGNOSIS — L853 Xerosis cutis: Secondary | ICD-10-CM | POA: Diagnosis not present

## 2022-02-28 DIAGNOSIS — H6121 Impacted cerumen, right ear: Secondary | ICD-10-CM

## 2022-02-28 DIAGNOSIS — L821 Other seborrheic keratosis: Secondary | ICD-10-CM | POA: Diagnosis not present

## 2022-02-28 DIAGNOSIS — J300 Vasomotor rhinitis: Secondary | ICD-10-CM

## 2022-02-28 DIAGNOSIS — R42 Dizziness and giddiness: Secondary | ICD-10-CM

## 2022-02-28 DIAGNOSIS — Z23 Encounter for immunization: Secondary | ICD-10-CM

## 2022-02-28 LAB — CBC WITH DIFFERENTIAL/PLATELET
Basophils Absolute: 0 10*3/uL (ref 0.0–0.1)
Basophils Relative: 0.4 % (ref 0.0–3.0)
Eosinophils Absolute: 0.1 10*3/uL (ref 0.0–0.7)
Eosinophils Relative: 1.3 % (ref 0.0–5.0)
HCT: 42.7 % (ref 36.0–46.0)
Hemoglobin: 14.2 g/dL (ref 12.0–15.0)
Lymphocytes Relative: 25.3 % (ref 12.0–46.0)
Lymphs Abs: 1.5 10*3/uL (ref 0.7–4.0)
MCHC: 33.3 g/dL (ref 30.0–36.0)
MCV: 96.1 fl (ref 78.0–100.0)
Monocytes Absolute: 0.5 10*3/uL (ref 0.1–1.0)
Monocytes Relative: 9.1 % (ref 3.0–12.0)
Neutro Abs: 3.7 10*3/uL (ref 1.4–7.7)
Neutrophils Relative %: 63.9 % (ref 43.0–77.0)
Platelets: 214 10*3/uL (ref 150.0–400.0)
RBC: 4.44 Mil/uL (ref 3.87–5.11)
RDW: 14.1 % (ref 11.5–15.5)
WBC: 5.9 10*3/uL (ref 4.0–10.5)

## 2022-02-28 LAB — COMPREHENSIVE METABOLIC PANEL
ALT: 11 U/L (ref 0–35)
AST: 13 U/L (ref 0–37)
Albumin: 4.8 g/dL (ref 3.5–5.2)
Alkaline Phosphatase: 60 U/L (ref 39–117)
BUN: 14 mg/dL (ref 6–23)
CO2: 36 mEq/L — ABNORMAL HIGH (ref 19–32)
Calcium: 10.4 mg/dL (ref 8.4–10.5)
Chloride: 97 mEq/L (ref 96–112)
Creatinine, Ser: 0.79 mg/dL (ref 0.40–1.20)
GFR: 68.8 mL/min (ref >60.00)
Glucose, Bld: 113 mg/dL — ABNORMAL HIGH (ref 70–99)
Potassium: 3.6 mEq/L (ref 3.5–5.1)
Sodium: 139 mEq/L (ref 135–145)
Total Bilirubin: 0.8 mg/dL (ref 0.2–1.2)
Total Protein: 6.9 g/dL (ref 6.0–8.3)

## 2022-02-28 LAB — TSH: TSH: 1.99 u[IU]/mL (ref 0.35–5.50)

## 2022-02-28 LAB — VITAMIN B12: Vitamin B-12: 164 pg/mL — ABNORMAL LOW (ref 211–911)

## 2022-02-28 MED ORDER — IPRATROPIUM BROMIDE 0.06 % NA SOLN: 2.0000 | Freq: Four times a day (QID) | NASAL | 12 refills | Status: DC

## 2022-02-28 NOTE — Addendum Note (Signed)
Addended by: Carter Kitten on: 02/28/2022 11:40 AM   Modules accepted: Orders

## 2022-02-28 NOTE — Assessment & Plan Note (Signed)
Cerumen impaction removal via irrigation Performed by :  Donna Loring CMA Consent for procedure obtained verbally. Bilateral ears lavaged/ irrigated with warm water gently. Pt tolerated procedure well, with no complications. After procedure ears clear of cerumen impaction, with minimal ear canal irritation and redness, no bleeding. TM intact and symptoms improved.   

## 2022-02-28 NOTE — Progress Notes (Signed)
Patient ID: Janet Chapman, female    DOB: [birth year], 86 y.o.   MRN: [id number]  This visit was conducted in person.  BP 120/80   Pulse 64   Temp 97.8 F (36.6 C) (Oral)   Ht 5' 2.5" (1.588 m)   Wt 141 lb 4 oz (64.1 kg)   SpO2 98%   BMI 25.42 kg/m    CC:  Chief Complaint  Patient presents with   Itchy Spot on Back   Dizziness   Sinus Drainage    Subjective:   HPI: ANALEIGH Chapman is a 86 y.o. female presenting on 02/28/2022 for Itchy Spot on Back, Dizziness, and Sinus Drainage  She presents with multiple issues. Husband  Itchy spot on her back... no associated rash or sore or skin lesion. Has SK  on abd and torso. Has not treated with anything.  2. Sinus drainage, chronic:  Nasal congestion and PND.... runny nose after eating or drinking.  Hx of allergic rhinitis  3. Dizziness:   She has had an MRI in the past in 2020 for the similar issue. Husband reports that  dizziness has seemed to increase with memantine dose increase to 21 mg daily... but did not resolve on lower dose or off.  Intermittent lasting hours to 1 day... almost every day.  Describes as slight off balance issue.  No vertigo,. No change with sitting to standing.   Drinking a lot of water.  No bleeding        Relevant past medical, surgical, family and social history reviewed and updated as indicated. Interim medical history since our last visit reviewed. Allergies and medications reviewed and updated. Outpatient Medications Prior to Visit  Medication Sig Dispense Refill   Apoaequorin (PREVAGEN PO) Take 1 capsule by mouth every morning.     aspirin EC 81 MG tablet Take 81 mg by mouth daily with supper. Swallow whole.     ketoconazole (NIZORAL) 2 % shampoo Apply 1 application. topically every 14 (fourteen) days.     losartan-hydrochlorothiazide (HYZAAR) 100-25 MG tablet TAKE 1/2 TABLET BY MOUTH DAILY 45 tablet 0   memantine (NAMENDA XR) 14 MG CP24 24 hr capsule Take 1 capsule (14 mg total) by  mouth daily. 30 capsule 5   metoprolol succinate (TOPROL-XL) 50 MG 24 hr tablet Take 0.5 tablets (25 mg total) by mouth daily. Take with or immediately following a meal. 45 tablet 1   potassium chloride SA (KLOR-CON M) 20 MEQ tablet Take 1 tablet (20 mEq total) by mouth 2 (two) times daily for 5 days. 10 tablet 0   simvastatin (ZOCOR) 40 MG tablet Take 1 tablet (40 mg total) by mouth at bedtime. 90 tablet 3   montelukast (SINGULAIR) 10 MG tablet TAKE 1 TABLET BY MOUTH AT BEDTIME (Patient not taking: Reported on 02/28/2022) 30 tablet 2   No facility-administered medications prior to visit.     Per HPI unless specifically indicated in ROS section below Review of Systems  Constitutional:  Negative for chills, fatigue and fever.  HENT:  Positive for postnasal drip and rhinorrhea. Negative for congestion, ear discharge, ear pain, facial swelling, sinus pressure, sinus pain, sneezing, sore throat and tinnitus.   Eyes:  Negative for pain.  Respiratory:  Negative for cough and shortness of breath.   Cardiovascular:  Negative for chest pain, palpitations and leg swelling.  Gastrointestinal:  Negative for abdominal pain.  Genitourinary:  Negative for dysuria and vaginal bleeding.  Musculoskeletal:  Negative for back pain.  Neurological:  Positive for dizziness. Negative for syncope, light-headedness and headaches.  Psychiatric/Behavioral:  Negative for dysphoric mood.    Objective:  BP 120/80   Pulse 64   Temp 97.8 F (36.6 C) (Oral)   Ht 5' 2.5" (1.588 m)   Wt 141 lb 4 oz (64.1 kg)   SpO2 98%   BMI 25.42 kg/m   Wt Readings from Last 3 Encounters:  02/28/22 141 lb 4 oz (64.1 kg)  01/04/22 141 lb 5 oz (64.1 kg)  10/07/21 147 lb 1 oz (66.7 kg)      Physical Exam Constitutional:      General: She is not in acute distress.    Appearance: Normal appearance. She is well-developed. She is not ill-appearing or toxic-appearing.  HENT:     Head: Normocephalic.     Right Ear: Hearing, tympanic  membrane, ear canal and external ear normal. Tympanic membrane is not erythematous, retracted or bulging.     Left Ear: Hearing, tympanic membrane, ear canal and external ear normal. Tympanic membrane is not erythematous, retracted or bulging.     Nose: No mucosal edema or rhinorrhea.     Right Sinus: No maxillary sinus tenderness or frontal sinus tenderness.     Left Sinus: No maxillary sinus tenderness or frontal sinus tenderness.     Mouth/Throat:     Pharynx: Uvula midline.  Eyes:     General: Lids are normal. Lids are everted, no foreign bodies appreciated.     Conjunctiva/sclera: Conjunctivae normal.     Pupils: Pupils are equal, round, and reactive to light.  Neck:     Thyroid: No thyroid mass or thyromegaly.     Vascular: No carotid bruit.     Trachea: Trachea normal.  Cardiovascular:     Rate and Rhythm: Normal rate and regular rhythm.     Pulses: Normal pulses.     Heart sounds: Normal heart sounds, S1 normal and S2 normal. No murmur heard.    No friction rub. No gallop.  Pulmonary:     Effort: Pulmonary effort is normal. No tachypnea or respiratory distress.     Breath sounds: Normal breath sounds. No decreased breath sounds, wheezing, rhonchi or rales.  Abdominal:     General: Bowel sounds are normal.     Palpations: Abdomen is soft.     Tenderness: There is no abdominal tenderness.  Musculoskeletal:     Cervical back: Normal range of motion and neck supple.  Skin:    General: Skin is warm and dry.     Findings: No rash.     Comments: Multiple SKs on back a couple with excoriations no additional rash other than moderately dry skin on torso.  Neurological:     Mental Status: She is alert.  Psychiatric:        Mood and Affect: Mood is not anxious or depressed.        Speech: Speech normal.        Behavior: Behavior normal. Behavior is cooperative.        Thought Content: Thought content normal.        Judgment: Judgment normal.       Results for orders placed or  performed in visit on 10/07/21  Urine Culture   Specimen: Blood  Result Value Ref Range   MICRO NUMBER: [id number]    SPECIMEN QUALITY: Adequate    Sample Source NOT GIVEN    STATUS: FINAL    ISOLATE 1: Enterococcus faecalis (A)  Susceptibility   Enterococcus faecalis - URINE CULTURE POSITIVE 1    AMPICILLIN <=2 Sensitive     VANCOMYCIN 1 Sensitive     NITROFURANTOIN* <=16 Sensitive      * Legend: S = Susceptible  I = Intermediate R = Resistant  NS = Not susceptible * = Not tested  NR = Not reported **NN = See antimicrobic comments   Basic Metabolic Panel  Result Value Ref Range   Sodium 139 135 - 145 mEq/L   Potassium 3.5 3.5 - 5.1 mEq/L   Chloride 95 (L) 96 - 112 mEq/L   CO2 36 (H) 19 - 32 mEq/L   Glucose, Bld 120 (H) 70 - 99 mg/dL   BUN 13 6 - 23 mg/dL   Creatinine, Ser 0.79 0.40 - 1.20 mg/dL   GFR 68.99 >60.00 mL/min   Calcium 10.4 8.4 - 10.5 mg/dL  CBC with Differential/Platelet  Result Value Ref Range   WBC 7.6 4.0 - 10.5 K/uL   RBC 4.15 3.87 - 5.11 Mil/uL   Hemoglobin 13.3 12.0 - 15.0 g/dL   HCT 39.1 36.0 - 46.0 %   MCV 94.1 78.0 - 100.0 fl   MCHC 34.1 30.0 - 36.0 g/dL   RDW 13.5 11.5 - 15.5 %   Platelets 228.0 150.0 - 400.0 K/uL   Neutrophils Relative % 72.2 43.0 - 77.0 %   Lymphocytes Relative 16.9 12.0 - 46.0 %   Monocytes Relative 9.6 3.0 - 12.0 %   Eosinophils Relative 1.0 0.0 - 5.0 %   Basophils Relative 0.3 0.0 - 3.0 %   Neutro Abs 5.5 1.4 - 7.7 K/uL   Lymphs Abs 1.3 0.7 - 4.0 K/uL   Monocytes Absolute 0.7 0.1 - 1.0 K/uL   Eosinophils Absolute 0.1 0.0 - 0.7 K/uL   Basophils Absolute 0.0 0.0 - 0.1 K/uL  POCT Urinalysis Dipstick (Automated)  Result Value Ref Range   Color, UA Yellow    Clarity, UA Clear    Glucose, UA Negative Negative   Bilirubin, UA Negative    Ketones, UA Negative    Spec Grav, UA >=1.030 (A) 1.010 - 1.025   Blood, UA Large    pH, UA 5.5 5.0 - 8.0   Protein, UA Negative Negative   Urobilinogen, UA 0.2 0.2 or 1.0 E.U./dL    Nitrite, UA Negative    Leukocytes, UA Negative Negative  POCT UA - Microscopic Only  Result Value Ref Range   WBC, Ur, HPF, POC none 0 - 5   RBC, Urine, Miroscopic many 0 - 2   Bacteria, U Microscopic     Mucus, UA     Epithelial cells, urine per micros many    Crystals, Ur, HPF, POC     Casts, Ur, LPF, POC none    Yeast, UA       COVID 19 screen:  No recent travel or known exposure to COVID19 The patient denies respiratory symptoms of COVID 19 at this time. The importance of social distancing was discussed today.   Assessment and Plan    Problem List Items Addressed This Visit     Chronic vasomotor rhinitis   Dizziness - Primary    Acute worsening of chronic issue.  Does not appear to be clearly associated with her medications now that it did not resolve with her coming off memantine. We will evaluate with labs for secondary causes.  No clear suggestion at this time of orthostatic hypotension.  Patient has a lot of difficulty explaining what she  feels like when she has the symptoms.  There is a small amount of fluid behind her left eardrum suggesting possible inner ear issues. No red flags suggesting CVA or TIA.      Relevant Orders   Comprehensive metabolic panel   CBC with Differential/Platelet   Vitamin B12   TSH   Dry skin dermatitis    Treat with topical salicylate cream twice daily. She can use OTC hydrocortisone on the itchy location twice daily as needed for 2 weeks.      Right ear impacted cerumen    Cerumen impaction removal via irrigation Performed by : Hedy Camara CMA Consent for procedure obtained verbally. Bilateral ears lavaged/ irrigated with warm water gently. Pt tolerated procedure well, with no complications. After procedure ears clear of cerumen impaction, with minimal ear canal irritation and redness, no bleeding. TM intact and symptoms improved.         Seborrheic keratoses    Chronic, discussed benign nature and no need to treat  unless irritated.  Encouraged her not to scratch the areas.       Meds ordered this encounter  Medications   ipratropium (ATROVENT) 0.06 % nasal spray    Sig: Place 2 sprays into both nostrils 4 (four) times daily.    Dispense:  15 mL    Refill:  12   Orders Placed This Encounter  Procedures   Comprehensive metabolic panel   CBC with Differential/Platelet   Vitamin B12   TSH     [name], MD

## 2022-02-28 NOTE — Assessment & Plan Note (Signed)
Treat with topical salicylate cream twice daily. She can use OTC hydrocortisone on the itchy location twice daily as needed for 2 weeks.

## 2022-02-28 NOTE — Assessment & Plan Note (Signed)
Chronic, discussed benign nature and no need to treat unless irritated.  Encouraged her not to scratch the areas.

## 2022-02-28 NOTE — Patient Instructions (Signed)
Please stop at the lab to have labs drawn.  Start ipratropium nasal spray.  Apply Cetaphil cream twice daily to back. Can apply topical cortisone 10 ( hydrocortisone cream) twice daily to back for itching.

## 2022-02-28 NOTE — Assessment & Plan Note (Addendum)
Acute worsening of chronic issue.  Does not appear to be clearly associated with her medications now that it did not resolve with her coming off memantine. We will evaluate with labs for secondary causes.  No clear suggestion at this time of orthostatic hypotension.  Patient has a lot of difficulty explaining what she feels like when she has the symptoms.  There is a small amount of fluid behind her left eardrum suggesting possible inner ear issues. No red flags suggesting CVA or TIA.

## 2022-03-13 ENCOUNTER — Telehealth: Payer: Self-pay | Admitting: Family Medicine

## 2022-03-13 NOTE — Telephone Encounter (Signed)
Patient husband called in stating that she was seen a few weeks ago for dizziness. She is still experiencing dizziness and he wants to know what to do now. Please advise. Thank you!

## 2022-03-15 MED ORDER — FLUTICASONE PROPIONATE 50 MCG/ACT NA SUSP: 2.0000 | Freq: Every day | NASAL | 2 refills | Status: AC

## 2022-03-15 NOTE — Telephone Encounter (Signed)
Will this be in addition to the ipratropium?

## 2022-03-15 NOTE — Addendum Note (Signed)
Addended by: Carter Kitten on: 03/15/2022 04:53 PM   Modules accepted: Orders

## 2022-03-15 NOTE — Telephone Encounter (Signed)
Mr. Lariccia notified by telephone to stop the ipratropium and change to Flonase 2 sprays per nostril daily for 2 to 4 weeks and if this doesn't help, then let us know and Dr. [name] will send in a prednisone taper.  Flonase prescription sent to [facility].

## 2022-03-15 NOTE — Telephone Encounter (Signed)
Have her stop the ipratropium please.  If the Flonase does not help we can always consider a prednisone taper

## 2022-03-21 ENCOUNTER — Telehealth: Payer: Self-pay | Admitting: Family Medicine

## 2022-03-21 NOTE — Telephone Encounter (Signed)
Patient husband Linus Orn called in and was wondering if Shondrea needs to come in for lab work on Friday since she just had them done in October. He can be reached at [phone]. Thank you!

## 2022-03-22 NOTE — Telephone Encounter (Signed)
Lab appointment cancelled.  Janet Chapman notified of this via telephone.

## 2022-03-22 NOTE — Telephone Encounter (Signed)
Can cancel lab work on Friday

## 2022-03-23 ENCOUNTER — Telehealth: Payer: Self-pay | Admitting: Family Medicine

## 2022-03-23 ENCOUNTER — Ambulatory Visit (INDEPENDENT_AMBULATORY_CARE_PROVIDER_SITE_OTHER): Payer: HMO

## 2022-03-23 VITALS — Wt 141.0 lb

## 2022-03-23 DIAGNOSIS — Z0000 Encounter for general adult medical examination without abnormal findings: Secondary | ICD-10-CM

## 2022-03-23 NOTE — Telephone Encounter (Signed)
-----   Message from Ellamae Sia sent at 03/13/2022 12:03 PM EDT ----- Regarding: Lab orders for Friday, 11.10.23 Patient is scheduled for CPX labs, please order future labs, Thanks , Karna Christmas

## 2022-03-23 NOTE — Patient Instructions (Signed)
[name] , Thank you for taking time to come for your Medicare Wellness Visit. I appreciate your ongoing commitment to your health goals. Please review the following plan we discussed and let me know if I can assist you in the future.   Screening recommendations/referrals: Colonoscopy: aged out Mammogram: aged out Recommended yearly ophthalmology/optometry visit for glaucoma screening and checkup Recommended yearly dental visit for hygiene and checkup  Vaccinations: Influenza vaccine: 02/28/22 Pneumococcal vaccine: 06/26/13 Tdap vaccine: 03/24/21 Shingles vaccine: Zostavax 05/20/10    Shingrix 04/14/21   Covid-19:06/05/19, 06/26/19, 04/13/20  Advanced directives: no  Conditions/risks identified: none  Next appointment: Follow up in one year for your annual wellness visit 03/26/23 @ 3:45 pm by phone   Preventive Care 65 Years and Older, Female Preventive care refers to lifestyle choices and visits with your health care provider that can promote health and wellness. What does preventive care include? A yearly physical exam. This is also called an annual well check. Dental exams once or twice a year. Routine eye exams. Ask your health care provider how often you should have your eyes checked. Personal lifestyle choices, including: Daily care of your teeth and gums. Regular physical activity. Eating a healthy diet. Avoiding tobacco and drug use. Limiting alcohol use. Practicing safe sex. Taking low-dose aspirin every day. Taking vitamin and mineral supplements as recommended by your health care provider. What happens during an annual well check? The services and screenings done by your health care provider during your annual well check will depend on your age, overall health, lifestyle risk factors, and family history of disease. Counseling  Your health care provider may ask you questions about your: Alcohol use. Tobacco use. Drug use. Emotional well-being. Home and relationship  well-being. Sexual activity. Eating habits. History of falls. Memory and ability to understand (cognition). Work and work Statistician. Reproductive health. Screening  You may have the following tests or measurements: Height, weight, and BMI. Blood pressure. Lipid and cholesterol levels. These may be checked every 5 years, or more frequently if you are over 8 years old. Skin check. Lung cancer screening. You may have this screening every year starting at age 12 if you have a 30-pack-year history of smoking and currently smoke or have quit within the past 15 years. Fecal occult blood test (FOBT) of the stool. You may have this test every year starting at age 51. Flexible sigmoidoscopy or colonoscopy. You may have a sigmoidoscopy every 5 years or a colonoscopy every 10 years starting at age 57. Hepatitis C blood test. Hepatitis B blood test. Sexually transmitted disease (STD) testing. Diabetes screening. This is done by checking your blood sugar (glucose) after you have not eaten for a while (fasting). You may have this done every 1-3 years. Bone density scan. This is done to screen for osteoporosis. You may have this done starting at age 61. Mammogram. This may be done every 1-2 years. Talk to your health care provider about how often you should have regular mammograms. Talk with your health care provider about your test results, treatment options, and if necessary, the need for more tests. Vaccines  Your health care provider may recommend certain vaccines, such as: Influenza vaccine. This is recommended every year. Tetanus, diphtheria, and acellular pertussis (Tdap, Td) vaccine. You may need a Td booster every 10 years. Zoster vaccine. You may need this after age 74. Pneumococcal 13-valent conjugate (PCV13) vaccine. One dose is recommended after age 87. Pneumococcal polysaccharide (PPSV23) vaccine. One dose is recommended after age 50.  Talk to your health care provider about which  screenings and vaccines you need and how often you need them. This information is not intended to replace advice given to you by your health care provider. Make sure you discuss any questions you have with your health care provider. Document Released: 05/28/2015 Document Revised: 01/19/2016 Document Reviewed: 03/02/2015 Elsevier Interactive Patient Education  2017 [place] Prevention in the Home Falls can cause injuries. They can happen to people of all ages. There are many things you can do to make your home safe and to help prevent falls. What can I do on the outside of my home? Regularly fix the edges of walkways and driveways and fix any cracks. Remove anything that might make you trip as you walk through a door, such as a raised step or threshold. Trim any bushes or trees on the path to your home. Use bright outdoor lighting. Clear any walking paths of anything that might make someone trip, such as rocks or tools. Regularly check to see if handrails are loose or broken. Make sure that both sides of any steps have handrails. Any raised decks and porches should have guardrails on the edges. Have any leaves, snow, or ice cleared regularly. Use sand or salt on walking paths during winter. Clean up any spills in your garage right away. This includes oil or grease spills. What can I do in the bathroom? Use night lights. Install grab bars by the toilet and in the tub and shower. Do not use towel bars as grab bars. Use non-skid mats or decals in the tub or shower. If you need to sit down in the shower, use a plastic, non-slip stool. Keep the floor dry. Clean up any water that spills on the floor as soon as it happens. Remove soap buildup in the tub or shower regularly. Attach bath mats securely with double-sided non-slip rug tape. Do not have throw rugs and other things on the floor that can make you trip. What can I do in the bedroom? Use night lights. Make sure that you have a  light by your bed that is easy to reach. Do not use any sheets or blankets that are too big for your bed. They should not hang down onto the floor. Have a firm chair that has side arms. You can use this for support while you get dressed. Do not have throw rugs and other things on the floor that can make you trip. What can I do in the kitchen? Clean up any spills right away. Avoid walking on wet floors. Keep items that you use a lot in easy-to-reach places. If you need to reach something above you, use a strong step stool that has a grab bar. Keep electrical cords out of the way. Do not use floor polish or wax that makes floors slippery. If you must use wax, use non-skid floor wax. Do not have throw rugs and other things on the floor that can make you trip. What can I do with my stairs? Do not leave any items on the stairs. Make sure that there are handrails on both sides of the stairs and use them. Fix handrails that are broken or loose. Make sure that handrails are as long as the stairways. Check any carpeting to make sure that it is firmly attached to the stairs. Fix any carpet that is loose or worn. Avoid having throw rugs at the top or bottom of the stairs. If you do have throw  rugs, attach them to the floor with carpet tape. Make sure that you have a light switch at the top of the stairs and the bottom of the stairs. If you do not have them, ask someone to add them for you. What else can I do to help prevent falls? Wear shoes that: Do not have high heels. Have rubber bottoms. Are comfortable and fit you well. Are closed at the toe. Do not wear sandals. If you use a stepladder: Make sure that it is fully opened. Do not climb a closed stepladder. Make sure that both sides of the stepladder are locked into place. Ask someone to hold it for you, if possible. Clearly mark and make sure that you can see: Any grab bars or handrails. First and last steps. Where the edge of each step  is. Use tools that help you move around (mobility aids) if they are needed. These include: Canes. Walkers. Scooters. Crutches. Turn on the lights when you go into a dark area. Replace any light bulbs as soon as they burn out. Set up your furniture so you have a clear path. Avoid moving your furniture around. If any of your floors are uneven, fix them. If there are any pets around you, be aware of where they are. Review your medicines with your doctor. Some medicines can make you feel dizzy. This can increase your chance of falling. Ask your doctor what other things that you can do to help prevent falls. This information is not intended to replace advice given to you by your health care provider. Make sure you discuss any questions you have with your health care provider. Document Released: 02/25/2009 Document Revised: 10/07/2015 Document Reviewed: 06/05/2014 Elsevier Interactive Patient Education  2017 [employer].

## 2022-03-23 NOTE — Progress Notes (Signed)
Virtual Visit via Telephone Note  I connected with  Janet Chapman on 03/23/22 at 11:15 AM EST by telephone and verified that I am speaking with the correct person using two identifiers.  Location: Patient: home Provider: [place] Persons participating in the virtual visit: [place]   I discussed the limitations, risks, security and privacy concerns of performing an evaluation and management service by telephone and the availability of in person appointments. The patient expressed understanding and agreed to proceed.  Interactive audio and video telecommunications were attempted between this nurse and patient, however failed, due to patient having technical difficulties OR patient did not have access to video capability.  We continued and completed visit with audio only.  Some vital signs may be absent or patient reported.   Dionisio David, LPN  Subjective:   Janet Chapman is a 86 y.o. female who presents for Medicare Annual (Subsequent) preventive examination.  Review of Systems     Cardiac Risk Factors include: advanced age (>45mn, >>24women);dyslipidemia;hypertension     Objective:    There were no vitals filed for this visit. There is no height or weight on file to calculate BMI.     03/23/2022   11:29 AM 09/27/2021   11:18 AM 03/18/2021    9:05 AM 10/18/2020   11:27 AM 12/04/2017    2:29 PM 11/06/2017   12:25 PM 11/27/2016    8:27 AM  Advanced Directives  Does Patient Have a Medical Advance Directive? No No No No Yes Yes Yes  Type of APersonnel officerLiving will HKealakekuaLiving will HRallsLiving will  Does patient want to make changes to medical advance directive?      No - Patient declined   Copy of H[place]abrook Farmsin Chart?     No - copy requested No - copy requested No - copy requested  Would patient like information on creating a medical advance  directive? No - Patient declined No - Patient declined Yes (MAU/Ambulatory/Procedural Areas - Information given) Yes (MAU/Ambulatory/Procedural Areas - Information given)       Current Medications (verified) Outpatient Encounter Medications as of 03/23/2022  Medication Sig   Apoaequorin (PREVAGEN PO) Take 1 capsule by mouth every morning.   aspirin EC 81 MG tablet Take 81 mg by mouth daily with supper. Swallow whole.   cyanocobalamin (VITAMIN B12) 1000 MCG tablet Take 1,000 mcg by mouth daily.   fluticasone (FLONASE) 50 MCG/ACT nasal spray Place 2 sprays into both nostrils daily.   ketoconazole (NIZORAL) 2 % shampoo Apply 1 application. topically every 14 (fourteen) days.   losartan-hydrochlorothiazide (HYZAAR) 100-25 MG tablet TAKE 1/2 TABLET BY MOUTH DAILY   memantine (NAMENDA XR) 14 MG CP24 24 hr capsule Take 1 capsule (14 mg total) by mouth daily.   metoprolol succinate (TOPROL-XL) 50 MG 24 hr tablet Take 0.5 tablets (25 mg total) by mouth daily. Take with or immediately following a meal.   montelukast (SINGULAIR) 10 MG tablet Take 10 mg by mouth at bedtime.   potassium chloride SA (KLOR-CON M) 20 MEQ tablet Take 1 tablet (20 mEq total) by mouth 2 (two) times daily for 5 days.   simvastatin (ZOCOR) 40 MG tablet Take 1 tablet (40 mg total) by mouth at bedtime.   No facility-administered encounter medications on file as of 03/23/2022.    Allergies (verified) Codeine   History: Past Medical History:  Diagnosis Date  Allergic rhinitis    Diabetes mellitus without complication ([place])    diet controlled   GERD (gastroesophageal reflux disease)    Hyperlipidemia    Hypertension    Memory loss    Osteoarthritis    Urinary incontinence    Past Surgical History:  Procedure Laterality Date   ABDOMINAL HYSTERECTOMY  1993   TA   CARDIOVASCULAR STRESS TEST  2004   H. [name], cardiolite-normal   CHOLECYSTECTOMY  4315'Q   COLONOSCOPY     LEFT HEART CATH AND CORONARY ANGIOGRAPHY N/A  11/06/2017   Procedure: LEFT HEART CATH AND CORONARY ANGIOGRAPHY;  Surgeon: Burnell Blanks, MD;  Location: [facility];  Service: Cardiovascular;  Laterality: N/A;   Family History  Problem Relation Age of Onset   Heart attack Father 36   Dementia Mother    Stroke Mother 69   Ovarian cancer Sister 101   Diabetes Other        Maternal side   Ovarian cancer Other        Grandmother   Ovarian cancer Other        aunt   Colon cancer Neg Hx    Social History   Socioeconomic History   Marital status: Married    Spouse name: Not on file   Number of children: 0   Years of education: Not on file   Highest education level: Not on file  Occupational History   Occupation: retired  Tobacco Use   Smoking status: Never   Smokeless tobacco: Never   Tobacco comments:    In college  Vaping Use   Vaping Use: Never used  Substance and Sexual Activity   Alcohol use: Not Currently   Drug use: No   Sexual activity: Never  Other Topics Concern   Not on file  Social History Narrative    Regular exercise: yes, Curves 2x a week   Married 48 + years    Diet: fruit and veggies, no FF, some water, artificial sweetener   HCPOA: [name], has living will, full code ( reviewed 2014)            Social Determinants of Health   Financial Resource Strain: Low Risk  (03/23/2022)   Overall Financial Resource Strain (CARDIA)    Difficulty of Paying Living Expenses: Not hard at all  Food Insecurity: No Food Insecurity (03/23/2022)   Hunger Vital Sign    Worried About Running Out of Food in the Last Year: Never true    [place] in the Last Year: Never true  Transportation Needs: No Transportation Needs (03/23/2022)   PRAPARE - Hydrologist (Medical): No    Lack of Transportation (Non-Medical): No  Physical Activity: Insufficiently Active (03/23/2022)   Exercise Vital Sign    Days of Exercise per Week: 3 days    Minutes of Exercise per Session:  30 min  Stress: No Stress Concern Present (03/23/2022)   [place]    Feeling of Stress : Not at all  Social Connections: Moderately Isolated (03/23/2022)   Social Connection and Isolation Panel [NHANES]    Frequency of Communication with Friends and Family: Never    Frequency of Social Gatherings with Friends and Family: Once a week    Attends Religious Services: More than 4 times per year    Active Member of [employer] or Organizations: No    Attends Archivist Meetings: Never  Marital Status: Married    Tobacco Counseling Counseling given: Not Answered Tobacco comments: In college   Clinical Intake:  Pre-visit preparation completed: Yes  Pain : No/denies pain     Nutritional Risks: None Diabetes: No  How often do you need to have someone help you when you read instructions, pamphlets, or other written materials from your doctor or pharmacy?: 1 - Never  Diabetic?no  Interpreter Needed?: No  Information entered by :: Kirke Shaggy LPN   Activities of Daily Living    03/23/2022   11:30 AM  In your present state of health, do you have any difficulty performing the following activities:  Hearing? 0  Vision? 0  Difficulty concentrating or making decisions? 0  Walking or climbing stairs? 0  Dressing or bathing? 0  Doing errands, shopping? 1  Preparing Food and eating ? N  Using the Toilet? N  In the past six months, have you accidently leaked urine? N  Do you have problems with loss of bowel control? N  Managing your Medications? N  Managing your Finances? N  Housekeeping or managing your Housekeeping? N    Patient Care Team: [name], MD as PCP - General Belva Crome, MD as PCP - Cardiology (Cardiology) Shawnie Dapper, DO (Optometry) Debbora Dus, [facility] as Pharmacist (Pharmacist)  Indicate any recent Medical Services you may have received from other than [facility] providers in the  past year (date may be approximate).     Assessment:   This is a routine wellness examination for Janet Chapman.  Hearing/Vision screen Hearing Screening - Comments:: No aids Vision Screening - Comments:: Reading- Dr.Parker   Dietary issues and exercise activities discussed: Current Exercise Habits: Home exercise routine, Type of exercise: walking, Time (Minutes): 30, Frequency (Times/Week): 3, Weekly Exercise (Minutes/Week): 90, Intensity: Mild   Goals Addressed             This Visit's Progress    DIET - EAT MORE FRUITS AND VEGETABLES         Depression Screen    03/23/2022   11:28 AM 09/22/2021   11:28 AM 03/18/2021    9:08 AM 03/11/2020   11:52 AM 12/20/2018   10:33 AM 12/04/2017    2:28 PM 12/14/2016    4:12 PM  PHQ 2/9 Scores  PHQ - 2 Score 0 0 0 0 0 0 0  PHQ- 9 Score 0     0     Fall Risk    03/23/2022   11:29 AM 09/22/2021   11:28 AM 03/18/2021    9:07 AM 03/11/2020   11:51 AM 12/11/2019   10:30 AM  Fall Risk   Falls in the past year? 0 0 0 0 0  Comment     Emmi Telephone Survey: data to providers prior to load  Number falls in past yr: 0  0    Injury with Fall? 0  0    Risk for fall due to : No Fall Risks  No Fall Risks    Follow up Falls prevention discussed;Falls evaluation completed Falls evaluation completed Falls prevention discussed      FALL RISK PREVENTION PERTAINING TO THE HOME:  Any stairs in or around the home? No  If so, are there any without handrails? No  Home free of loose throw rugs in walkways, pet beds, electrical cords, etc? Yes  Adequate lighting in your home to reduce risk of falls? Yes   ASSISTIVE DEVICES UTILIZED TO PREVENT FALLS:  Life alert? No  Use of a cane, walker or w/c? No  Grab bars in the bathroom? No  Shower chair or bench in shower? No  Elevated toilet seat or a handicapped toilet? No     Cognitive Function:    01/04/2022    1:17 PM 06/30/2021   10:33 AM 12/15/2020   11:18 AM 06/24/2020   10:07 AM 12/22/2019   11:05 AM   MMSE - Mini Mental State Exam  Orientation to time '3 3 5 4 5  '$ Orientation to Place '4 3 5 5 5  '$ Registration '3 3 3 3 3  '$ Attention/ Calculation '4 1 5 1 5  '$ Attention/Calculation-comments   spelled world    Recall 0 0 0 1 1  Language- name 2 objects '2 2 2 2 2  '$ Language- repeat '1 1 1 1 1  '$ Language- follow 3 step command '3 3 3 3 3  '$ Language- read & follow direction '1 1 1 1 1  '$ Write a sentence '1 1 1 1 1  '$ Copy design 1 1 0 1 1  Total score '23 19 26 23 28        '$ 03/23/2022   11:30 AM  6CIT Screen  What Year? 0 points  What month? 0 points  What time? 0 points  Count back from 20 0 points  Months in reverse 2 points  Repeat phrase 4 points  Total Score 6 points    Immunizations Immunization History  Administered Date(s) Administered   Fluad Quad(high Dose 65+) 03/11/2020, 03/24/2021, 02/28/2022   Influenza Split 03/16/2011, 02/21/2012, 02/06/2013   Influenza Whole 02/13/2007, 03/12/2008, 02/09/2009, 02/12/2009   Influenza, High Dose Seasonal PF 02/19/2017   Influenza, Seasonal, Injecte, Preservative Fre 02/17/2014   Influenza,inj,Quad PF,6+ Mos 01/07/2015, 02/03/2016, 02/21/2018   Influenza,inj,quad, With Preservative 01/24/2019   PFIZER(Purple Top)SARS-COV-2 Vaccination 06/05/2019, 06/26/2019, 04/13/2020   Pneumococcal Conjugate-13 06/26/2013   Pneumococcal Polysaccharide-23 05/16/2003, 05/20/2010   Td 04/19/2007, 03/24/2021   Zoster Recombinat (Shingrix) 04/14/2021   Zoster, Live 05/20/2010    TDAP status: Up to date  Flu Vaccine status: Up to date  Pneumococcal vaccine status: Up to date  Covid-19 vaccine status: Completed vaccines  Qualifies for Shingles Vaccine? Yes   Zostavax completed Yes   Shingrix Completed?: No.    Education has been provided regarding the importance of this vaccine. Patient has been advised to call insurance company to determine out of pocket expense if they have not yet received this vaccine. Advised may also receive vaccine at local  pharmacy or Health Dept. Verbalized acceptance and understanding.  Screening Tests Health Maintenance  Topic Date Due   Diabetic kidney evaluation - Urine ACR  05/19/2011   COVID-19 Vaccine (4 - Pfizer risk series) 06/08/2020   OPHTHALMOLOGY EXAM  03/05/2021   Zoster Vaccines- Shingrix (2 of 2) 06/09/2021   FOOT EXAM  03/21/2022   HEMOGLOBIN A1C  03/25/2022   Diabetic kidney evaluation - GFR measurement  03/01/2023   Medicare Annual Wellness (AWV)  03/24/2023   MAMMOGRAM  05/18/2023   DEXA SCAN  05/17/2026   TETANUS/TDAP  03/25/2031   Pneumonia Vaccine 60+ Years old  Completed   INFLUENZA VACCINE  Completed   HPV VACCINES  Aged Out    Health Maintenance  Health Maintenance Due  Topic Date Due   Diabetic kidney evaluation - Urine ACR  05/19/2011   COVID-19 Vaccine (4 - Pfizer risk series) 06/08/2020   OPHTHALMOLOGY EXAM  03/05/2021   Zoster Vaccines- Shingrix (2 of 2) 06/09/2021   FOOT EXAM  03/21/2022    Colorectal cancer screening: No longer required.   Mammogram status: No longer required due to age.    Lung Cancer Screening: (Low Dose CT Chest recommended if Age 45-80 years, 30 pack-year currently smoking OR have quit w/in 15years.) does not qualify.     Additional Screening:  Hepatitis C Screening: does not qualify; Completed no  Vision Screening: Recommended annual ophthalmology exams for early detection of glaucoma and other disorders of the eye. Is the patient up to date with their annual eye exam?  Yes  Who is the provider or what is the name of the office in which the patient attends annual eye exams? Dr.Parker If pt is not established with a provider, would they like to be referred to a provider to establish care? No .   Dental Screening: Recommended annual dental exams for proper oral hygiene  Community Resource Referral / Chronic Care Management: CRR required this visit?  No   CCM required this visit?  No      Plan:     I have personally  reviewed and noted the following in the patient's chart:   Medical and social history Use of alcohol, tobacco or illicit drugs  Current medications and supplements including opioid prescriptions. Patient is not currently taking opioid prescriptions. Functional ability and status Nutritional status Physical activity Advanced directives List of other physicians Hospitalizations, surgeries, and ER visits in previous 12 months Vitals Screenings to include cognitive, depression, and falls Referrals and appointments  In addition, I have reviewed and discussed with patient certain preventive protocols, quality metrics, and best practice recommendations. A written personalized care plan for preventive services as well as general preventive health recommendations were provided to patient.     Dionisio David, LPN   05/21/5535   Nurse Notes: none

## 2022-03-24 ENCOUNTER — Other Ambulatory Visit: Payer: PPO

## 2022-03-31 ENCOUNTER — Encounter: Payer: Self-pay | Admitting: Family Medicine

## 2022-03-31 ENCOUNTER — Ambulatory Visit (INDEPENDENT_AMBULATORY_CARE_PROVIDER_SITE_OTHER): Payer: HMO | Admitting: Family Medicine

## 2022-03-31 VITALS — BP 120/60 | HR 78 | Temp 97.8°F | Ht 62.25 in | Wt 142.4 lb

## 2022-03-31 DIAGNOSIS — I25110 Atherosclerotic heart disease of native coronary artery with unstable angina pectoris: Secondary | ICD-10-CM

## 2022-03-31 DIAGNOSIS — E538 Deficiency of other specified B group vitamins: Secondary | ICD-10-CM | POA: Insufficient documentation

## 2022-03-31 DIAGNOSIS — Z0000 Encounter for general adult medical examination without abnormal findings: Secondary | ICD-10-CM

## 2022-03-31 DIAGNOSIS — E1169 Type 2 diabetes mellitus with other specified complication: Secondary | ICD-10-CM | POA: Diagnosis not present

## 2022-03-31 DIAGNOSIS — E1159 Type 2 diabetes mellitus with other circulatory complications: Secondary | ICD-10-CM

## 2022-03-31 DIAGNOSIS — F02B0 Dementia in other diseases classified elsewhere, moderate, without behavioral disturbance, psychotic disturbance, mood disturbance, and anxiety: Secondary | ICD-10-CM

## 2022-03-31 DIAGNOSIS — G301 Alzheimer's disease with late onset: Secondary | ICD-10-CM

## 2022-03-31 DIAGNOSIS — I152 Hypertension secondary to endocrine disorders: Secondary | ICD-10-CM

## 2022-03-31 DIAGNOSIS — E785 Hyperlipidemia, unspecified: Secondary | ICD-10-CM

## 2022-03-31 LAB — POCT GLYCOSYLATED HEMOGLOBIN (HGB A1C): Hemoglobin A1C: 6 % — AB (ref 4.0–5.6)

## 2022-03-31 MED ORDER — MEMANTINE HCL ER 21 MG PO CP24: 21.0000 mg | ORAL_CAPSULE | Freq: Every day | ORAL | 11 refills | Status: DC

## 2022-03-31 MED ORDER — CYANOCOBALAMIN 1000 MCG/ML IJ SOLN
1000.0000 ug | Freq: Once | INTRAMUSCULAR | Status: AC
  Administered 2022-03-31: 1000 ug via INTRAMUSCULAR

## 2022-03-31 NOTE — Assessment & Plan Note (Signed)
Well controlled with diet. 

## 2022-03-31 NOTE — Progress Notes (Signed)
Patient ID: Janet Chapman, female    DOB: 1935-10-01, 86 y.o.   MRN: [id number]  This visit was conducted in person.  BP 120/60   Pulse 78   Temp 97.8 F (36.6 C) (Oral)   Ht 5' 2.25" (1.581 m)   Wt 142 lb 6 oz (64.6 kg)   SpO2 96%   BMI 25.83 kg/m    CC:  Chief Complaint  Patient presents with   Annual Exam    Part 2    Subjective:   HPI: Janet Chapman is a 85 y.o. female presenting on 03/31/2022 for Annual Exam (Part 2)   The patient presents for annual medicare wellness, complete physical and review of chronic health problems. He/She also has the following acute concerns today:  The patient saw a LPN or RN for medicare wellness visit.  Prevention and wellness was reviewed in detail. Note reviewed and important notes copied below.  She presents with her husband who assists primarily with history.  Hypertension:  Well-controlled on losartan hydrochlorothiazide 100/20 5/2 tablet daily, metoprolol XL 50 mg half tablet daily BP Readings from Last 3 Encounters:  03/31/22 120/60  02/28/22 120/80  01/04/22 115/63  Using medication without problems or lightheadedness:  occ Chest pain with exertion: none Edema:none Short of breath: none Average home BPs: Other issues: Wt Readings from Last 3 Encounters:  03/31/22 142 lb 6 oz (64.6 kg)  03/23/22 141 lb (64 kg)  02/28/22 141 lb 4 oz (64.1 kg)     Diabetes: A1c 6 on diet control. Using medications without difficulties: Hypoglycemic episodes: Hyperglycemic episodes: Feet problems: Blood Sugars averaging: eye exam within last year:  History of coronary artery disease with no current angina  Dementia: Followed by neurology memantine XR 14 mg p.o. daily (dizziness did not improve with decreasing from the 22 )    Due for re-val of cholesterol..  Will check at next office visit   Low B12.. now on supplement  Relevant past medical, surgical, family and social history reviewed and updated as indicated. Interim  medical history since our last visit reviewed. Allergies and medications reviewed and updated. Outpatient Medications Prior to Visit  Medication Sig Dispense Refill   Apoaequorin (PREVAGEN PO) Take 1 capsule by mouth every morning.     aspirin EC 81 MG tablet Take 81 mg by mouth daily with supper. Swallow whole.     cyanocobalamin (VITAMIN B12) 1000 MCG tablet Take 1,000 mcg by mouth daily.     fluticasone (FLONASE) 50 MCG/ACT nasal spray Place 2 sprays into both nostrils daily. 16 g 2   ketoconazole (NIZORAL) 2 % shampoo Apply 1 application. topically every 14 (fourteen) days.     losartan-hydrochlorothiazide (HYZAAR) 100-25 MG tablet TAKE 1/2 TABLET BY MOUTH DAILY 45 tablet 0   memantine (NAMENDA XR) 14 MG CP24 24 hr capsule Take 1 capsule (14 mg total) by mouth daily. 30 capsule 5   metoprolol succinate (TOPROL-XL) 50 MG 24 hr tablet Take 0.5 tablets (25 mg total) by mouth daily. Take with or immediately following a meal. 45 tablet 1   montelukast (SINGULAIR) 10 MG tablet Take 10 mg by mouth at bedtime.     potassium chloride SA (KLOR-CON M) 20 MEQ tablet Take 1 tablet (20 mEq total) by mouth 2 (two) times daily for 5 days. 10 tablet 0   simvastatin (ZOCOR) 40 MG tablet Take 1 tablet (40 mg total) by mouth at bedtime. 90 tablet 3   No facility-administered medications prior to  visit.     Per HPI unless specifically indicated in ROS section below Review of Systems  Constitutional:  Negative for fatigue and fever.  HENT:  Negative for congestion.   Eyes:  Negative for pain.  Respiratory:  Negative for cough and shortness of breath.   Cardiovascular:  Negative for chest pain, palpitations and leg swelling.  Gastrointestinal:  Negative for abdominal pain.  Genitourinary:  Negative for dysuria and vaginal bleeding.  Musculoskeletal:  Negative for back pain.  Neurological:  Negative for syncope, light-headedness and headaches.  Psychiatric/Behavioral:  Negative for dysphoric mood.     Objective:  BP 120/60   Pulse 78   Temp 97.8 F (36.6 C) (Oral)   Ht 5' 2.25" (1.581 m)   Wt 142 lb 6 oz (64.6 kg)   SpO2 96%   BMI 25.83 kg/m   Wt Readings from Last 3 Encounters:  03/31/22 142 lb 6 oz (64.6 kg)  03/23/22 141 lb (64 kg)  02/28/22 141 lb 4 oz (64.1 kg)      Physical Exam Vitals and nursing note reviewed.  Constitutional:      General: She is not in acute distress.    Appearance: Normal appearance. She is well-developed. She is not ill-appearing or toxic-appearing.  HENT:     Head: Normocephalic.     Right Ear: Hearing, tympanic membrane, ear canal and external ear normal.     Left Ear: Hearing, tympanic membrane, ear canal and external ear normal.     Nose: Nose normal.  Eyes:     General: Lids are normal. Lids are everted, no foreign bodies appreciated.     Conjunctiva/sclera: Conjunctivae normal.     Pupils: Pupils are equal, round, and reactive to light.  Neck:     Thyroid: No thyroid mass or thyromegaly.     Vascular: No carotid bruit.     Trachea: Trachea normal.  Cardiovascular:     Rate and Rhythm: Normal rate and regular rhythm.     Heart sounds: Normal heart sounds, S1 normal and S2 normal. No murmur heard.    No gallop.  Pulmonary:     Effort: Pulmonary effort is normal. No respiratory distress.     Breath sounds: Normal breath sounds. No wheezing, rhonchi or rales.  Abdominal:     General: Bowel sounds are normal. There is no distension or abdominal bruit.     Palpations: Abdomen is soft. There is no fluid wave or mass.     Tenderness: There is no abdominal tenderness. There is no guarding or rebound.     Hernia: No hernia is present.  Musculoskeletal:     Cervical back: Normal range of motion and neck supple.  Lymphadenopathy:     Cervical: No cervical adenopathy.  Skin:    General: Skin is warm and dry.     Findings: No rash.  Neurological:     Mental Status: She is alert.     Cranial Nerves: No cranial nerve deficit.      Sensory: No sensory deficit.  Psychiatric:        Mood and Affect: Mood is not anxious or depressed.        Speech: Speech normal.        Behavior: Behavior normal. Behavior is cooperative.        Judgment: Judgment normal.       Results for orders placed or performed in visit on 02/28/22  Comprehensive metabolic panel  Result Value Ref Range   Sodium 139 135 -  145 mEq/L   Potassium 3.6 3.5 - 5.1 mEq/L   Chloride 97 96 - 112 mEq/L   CO2 36 (H) 19 - 32 mEq/L   Glucose, Bld 113 (H) 70 - 99 mg/dL   BUN 14 6 - 23 mg/dL   Creatinine, Ser 0.79 0.40 - 1.20 mg/dL   Total Bilirubin 0.8 0.2 - 1.2 mg/dL   Alkaline Phosphatase 60 39 - 117 U/L   AST 13 0 - 37 U/L   ALT 11 0 - 35 U/L   Total Protein 6.9 6.0 - 8.3 g/dL   Albumin 4.8 3.5 - 5.2 g/dL   GFR 68.80 >60.00 mL/min   Calcium 10.4 8.4 - 10.5 mg/dL  CBC with Differential/Platelet  Result Value Ref Range   WBC 5.9 4.0 - 10.5 K/uL   RBC 4.44 3.87 - 5.11 Mil/uL   Hemoglobin 14.2 12.0 - 15.0 g/dL   HCT 42.7 36.0 - 46.0 %   MCV 96.1 78.0 - 100.0 fl   MCHC 33.3 30.0 - 36.0 g/dL   RDW 14.1 11.5 - 15.5 %   Platelets 214.0 150.0 - 400.0 K/uL   Neutrophils Relative % 63.9 43.0 - 77.0 %   Lymphocytes Relative 25.3 12.0 - 46.0 %   Monocytes Relative 9.1 3.0 - 12.0 %   Eosinophils Relative 1.3 0.0 - 5.0 %   Basophils Relative 0.4 0.0 - 3.0 %   Neutro Abs 3.7 1.4 - 7.7 K/uL   Lymphs Abs 1.5 0.7 - 4.0 K/uL   Monocytes Absolute 0.5 0.1 - 1.0 K/uL   Eosinophils Absolute 0.1 0.0 - 0.7 K/uL   Basophils Absolute 0.0 0.0 - 0.1 K/uL  Vitamin B12  Result Value Ref Range   Vitamin B-12 164 (L) 211 - 911 pg/mL  TSH  Result Value Ref Range   TSH 1.99 0.35 - 5.50 uIU/mL     COVID 19 screen:  No recent travel or known exposure to COVID19 The patient denies respiratory symptoms of COVID 19 at this time. The importance of social distancing was discussed today.   Assessment and Plan The patient's preventative maintenance and recommended  screening tests for an annual wellness exam were reviewed in full today. Brought up to date unless services declined.  Counselled on the importance of diet, exercise, and its role in overall health and mortality. The patient's FH and SH was reviewed, including their home life, tobacco status, and drug and alcohol status.    Vaccines: Flu given 02/28/2022, uptodate with  pneumovax, prevnar.  Get shingrix at pharmacy. Mammo:   3D Had 4/18 nml, no family history of breast cancer... Wishes to continue after every 2 year. 05/2021, plan in 05/2023 DVE: pap and DVE not indicated. TAH. Sister with ovarian cancer   DEXA: Normal, 2018, 05/17/2021 osteopenia,  repeat 2 year,  work on walking, Ca and Vit D. Colon: 02/01/2005  Nml, Dr. Wynetta Emery, 12/2016 Cologuard, no further indicated Nonsmoker  Due for diabetic urine microalbumin. Will obtain records of eye exam.  Problem List Items Addressed This Visit     Hyperlipidemia associated with type 2 diabetes mellitus ([place]) (Chronic)    Due for re-eval on simvastatin.  LDL goal < 70 given CAD.      Hypertension associated with diabetes ([place]) (Chronic)    Stable, chronic.  Continue current medication.  Well-controlled on losartan hydrochlorothiazide 100/20 5/2 tablet daily, metoprolol XL 50 mg half tablet daily      Type 2 diabetes mellitus with circulatory disorder HTN ([facility]) (Chronic)  Well-controlled with diet      Relevant Orders   POCT glycosylated hemoglobin (Hb A1C) (Completed)   Hemoglobin A1c   Lipid panel   Comprehensive metabolic panel   Microalbumin / creatinine urine ratio   B12 deficiency     Will given B12 1g today and continue oral supplementation.  Could be worsening memory or contributing to dizziness.      Coronary artery disease involving native coronary artery of native heart with unstable angina pectoris (H[facility]) - Primary    Chronic, stable, followed by cardiology.  Baby aspirin 81 mg daily and simvastatin 40 mg daily       Dementia ([facility])     Chronic stable... will increase back to 21 mg daily of memantine XR.      Relevant Medications   memantine (NAMENDA XR) 21 MG CP24 24 hr capsule     [name], MD

## 2022-03-31 NOTE — Patient Instructions (Addendum)
Get second Shingrix vaccine, consider COVID  Vaccine for this year.  Increase memantine back to 21 mg daily.

## 2022-03-31 NOTE — Assessment & Plan Note (Signed)
Chronic, stable, followed by cardiology.  Baby aspirin 81 mg daily and simvastatin 40 mg daily

## 2022-03-31 NOTE — Addendum Note (Signed)
Addended by: Carter Kitten on: 03/31/2022 10:33 AM   Modules accepted: Orders

## 2022-03-31 NOTE — Assessment & Plan Note (Addendum)
Chronic stable... will increase back to 21 mg daily of memantine XR.

## 2022-03-31 NOTE — Assessment & Plan Note (Signed)
Will given B12 1g today and continue oral supplementation.  Could be worsening memory or contributing to dizziness.

## 2022-03-31 NOTE — Assessment & Plan Note (Signed)
Due for re-eval on simvastatin.  LDL goal < 70 given CAD.

## 2022-03-31 NOTE — Assessment & Plan Note (Signed)
Stable, chronic.  Continue current medication.  Well-controlled on losartan hydrochlorothiazide 100/20 5/2 tablet daily, metoprolol XL 50 mg half tablet daily

## 2022-04-12 ENCOUNTER — Encounter: Payer: Self-pay | Admitting: Family Medicine

## 2022-04-22 ENCOUNTER — Other Ambulatory Visit: Payer: Self-pay | Admitting: Family Medicine

## 2022-04-25 ENCOUNTER — Telehealth: Payer: Self-pay | Admitting: Neurology

## 2022-04-25 NOTE — Telephone Encounter (Signed)
Pt husband is calling. Requesting refill on medication memantine (NAMENDA XR) 21 MG CP24 24 hr capsule. Refill should be sent to  [facility]. Stated pt took her last pill yesterday and need it sent today if possible.

## 2022-04-26 MED ORDER — MEMANTINE HCL ER 14 MG PO CP24: 14.0000 mg | ORAL_CAPSULE | Freq: Every day | ORAL | 5 refills | Status: DC

## 2022-04-26 NOTE — Addendum Note (Signed)
Addended by: Verlin Grills on: 04/26/2022 01:14 PM   Modules accepted: Orders

## 2022-04-26 NOTE — Telephone Encounter (Signed)
I have refilled the 14 mg of Namenda to the requested pharmacy. Please up date the pt, thanks.

## 2022-04-26 NOTE — Telephone Encounter (Signed)
P's husband said, pt is on 14 mg for Memantine. Have been on for a month and half. Could Sarah, NP send in a prescription for Memantine 14 mg to [facility]?

## 2022-05-04 ENCOUNTER — Other Ambulatory Visit: Payer: Self-pay | Admitting: Family Medicine

## 2022-05-11 ENCOUNTER — Other Ambulatory Visit: Payer: Self-pay | Admitting: Family Medicine

## 2022-05-23 ENCOUNTER — Ambulatory Visit: Payer: HMO | Admitting: Internal Medicine

## 2022-05-25 ENCOUNTER — Ambulatory Visit (INDEPENDENT_AMBULATORY_CARE_PROVIDER_SITE_OTHER): Payer: PPO | Admitting: Family Medicine

## 2022-05-25 ENCOUNTER — Encounter: Payer: Self-pay | Admitting: Family Medicine

## 2022-05-25 VITALS — BP 146/70 | HR 45 | Temp 98.0°F | Resp 16 | Ht 62.25 in | Wt 141.5 lb

## 2022-05-25 DIAGNOSIS — R42 Dizziness and giddiness: Secondary | ICD-10-CM

## 2022-05-25 DIAGNOSIS — R450 Nervousness: Secondary | ICD-10-CM | POA: Insufficient documentation

## 2022-05-25 DIAGNOSIS — R0789 Other chest pain: Secondary | ICD-10-CM | POA: Diagnosis not present

## 2022-05-25 DIAGNOSIS — R001 Bradycardia, unspecified: Secondary | ICD-10-CM | POA: Diagnosis not present

## 2022-05-25 MED ORDER — ALPRAZOLAM 0.25 MG PO TABS: ORAL_TABLET | ORAL | 0 refills | Status: DC

## 2022-05-25 NOTE — Assessment & Plan Note (Addendum)
This is a chronic issue for her and does not appear to be new.  It is likely secondary to her beta-blocker.  Given she is potentially symptomatic we will hold her beta-blocker and follow her heart rate and blood pressure at home.  Blood pressure goal is less than 150/90.  Her husband will contact me if her blood pressure increases above this off this medication.  If it does so I will consider increasing to a full tablet of the losartan hydrochlorothiazide. If bradycardia is persistent I have encouraged her to return to cardiology for their input.

## 2022-05-25 NOTE — Assessment & Plan Note (Signed)
Chronic, recurrent Some of the description of her symptoms could potentially be from her bradycardia so I have asked them to stop the metoprolol.  Of note husband reports when she has been off this for 5 days recently there was no improvement with dizziness.  Some of her symptoms today sound potentially consistent with vertigo, but not exactly consistent with BPPV. Recommended discussion with neurologist at her upcoming appointment with [facility] to determine if there is potentially a neurologic cause of her vertigo or if they feel it is primarily peripheral.

## 2022-05-25 NOTE — Assessment & Plan Note (Addendum)
Acute, Most clearly associated with eating and likely GERD. EKG in office with bradycardia but unchanged from previous.  No clear acute cardiac syndrome.

## 2022-05-25 NOTE — Assessment & Plan Note (Signed)
Acute This seems to be associated with her dizziness.   During this does seem to be associated with irritability and fatigue. Blood pressures and blood sugar during episode are in normal range.  I wonder if there is an element of anxiety but patient is a poor historian giving her Alzheimer's dementia.  We will try a very low-dose of alprazolam to use as needed.

## 2022-05-25 NOTE — Patient Instructions (Addendum)
Hold metoprolol.   Follow BP at home.Marland Kitchen goal < 150/90.  Follow up with Cardiology yearly.  Discuss dizziness , jitteriness with neurology  at upcoming OV to see if  any recommendations. Use Pepcid AC for chest pressure, burping. Can try a trial of alprazolam dose as needed for spells of jitteriness and possible anxiety.

## 2022-05-25 NOTE — Progress Notes (Signed)
Patient ID: [name], female    DOB: April 29, 1936, 87 y.o.   MRN: [id number]  This visit was conducted in person.  BP (!) 146/70   Pulse (!) 45   Temp 98 F (36.7 C)   Resp 16   Ht 5' 2.25" (1.581 m)   Wt 141 lb 8 oz (64.2 kg)   SpO2 96%   BMI 25.67 kg/m    CC:  Chief Complaint  Patient presents with   Acute Visit    Headache,jittery Dizziness, chest pain like she needs to bump, and heartburn. Started 3 weeks ago.    Subjective:   HPI: Janet Chapman is a 87 y.o. female  with history of Alzheimer's dementia, CAD, HTN, DM2, High cholesterol, presenting on 05/25/2022 for Acute Visit (Headache,jittery Dizziness, chest pain like she needs to bump, and heartburn. Started 3 weeks ago.)  She presents to the office today with her husband who provides most the history given her dementia.  He states she has been mentioning headache and jitteriness as well as dizziness in the past 3 weeks off and on.  She describes a feeling of shakiness inside her body.  She denies any palpitations but her husband states that time she has noted her heart beats heavily.  Now in the last 2 days she has noted some chest pressure.  Primarily after a meal where she feels like she has to burp.  There was one episode where she burped and felt better.  No clear exertional component.  No nausea or heartburn. No shortness of breath or wheeze.  No upper respiratory tract symptoms.  No recent med changes,  Blood pressure usually well-controlled on half a tablet losartan hydrochlorothiazide and half a tablet of low-dose metoprolol. Heart rate typically ranges from 48-78 per chart review  Husband reports he held metoprolol for 1 week to see if dizziness improved.. no improvement. BP 130-142/60-72 while off.. HR 68-70. Started back in last 2-3 days.  BP Readings from Last 3 Encounters:  05/25/22 (!) 146/70  03/31/22 120/60  02/28/22 120/80   Blood sugar in nml range when feeling jittery.  In the past  she has seen cardiology Dr. [name].  He is now retired.  She most recently saw him in May 2022 for a ZIO monitor which was unremarkable. 2020 echocardiogram showed normal EF, mild dilation of atria. 2019 left heart catheterization: Mid RCA lesion is 20% stenosed. Ost Cx to Prox Cx lesion is 20% stenosed. Ost 2nd Mrg lesion is 20% stenosed. Ost 1st Mrg lesion is 30% stenosed. Prox LAD to Mid LAD lesion is 70% stenosed. Ost 1st Diag-1 lesion is 50% stenosed. Ost 1st Diag-2 lesion is 60% stenosed. The left ventricular systolic function is normal. LV end diastolic pressure is normal. The left ventricular ejection fraction is greater than 65% by visual estimate. There is no mitral valve regurgitation.      Relevant past medical, surgical, family and social history reviewed and updated as indicated. Interim medical history since our last visit reviewed. Allergies and medications reviewed and updated. Outpatient Medications Prior to Visit  Medication Sig Dispense Refill   Apoaequorin (PREVAGEN PO) Take 1 capsule by mouth every morning.     aspirin EC 81 MG tablet Take 81 mg by mouth daily with supper. Swallow whole.     cyanocobalamin (VITAMIN B12) 1000 MCG tablet Take 1,000 mcg by mouth daily.     fluticasone (FLONASE) 50 MCG/ACT nasal spray Place 2 sprays into both nostrils daily. [place]  g 2   ketoconazole (NIZORAL) 2 % shampoo Apply 1 application. topically every 14 (fourteen) days.     losartan-hydrochlorothiazide (HYZAAR) 100-25 MG tablet TAKE 1/2 TABLET BY MOUTH DAILY 45 tablet 1   memantine (NAMENDA XR) 14 MG CP24 24 hr capsule Take 1 capsule (14 mg total) by mouth daily. 30 capsule 5   montelukast (SINGULAIR) 10 MG tablet TAKE 1 TABLET BY MOUTH AT BEDTIME 30 tablet 2   potassium chloride SA (KLOR-CON M) 20 MEQ tablet Take 1 tablet (20 mEq total) by mouth 2 (two) times daily for 5 days. 10 tablet 0   simvastatin (ZOCOR) 40 MG tablet Take 1 tablet (40 mg total) by mouth at bedtime. 90 tablet  3   metoprolol succinate (TOPROL-XL) 50 MG 24 hr tablet Take 0.5 tablets (25 mg total) by mouth daily. Take with or immediately following a meal. 45 tablet 3   No facility-administered medications prior to visit.     Per HPI unless specifically indicated in ROS section below Review of Systems  Constitutional:  Negative for fatigue and fever.  HENT:  Negative for congestion.   Eyes:  Negative for pain.  Respiratory:  Negative for cough and shortness of breath.   Cardiovascular:  Negative for chest pain, palpitations and leg swelling.  Gastrointestinal:  Negative for abdominal pain.  Genitourinary:  Negative for dysuria and vaginal bleeding.  Musculoskeletal:  Negative for back pain.  Neurological:  Negative for syncope, light-headedness and headaches.  Psychiatric/Behavioral:  Negative for dysphoric mood.    Objective:  BP (!) 146/70   Pulse (!) 45   Temp 98 F (36.7 C)   Resp 16   Ht 5' 2.25" (1.581 m)   Wt 141 lb 8 oz (64.2 kg)   SpO2 96%   BMI 25.67 kg/m   Wt Readings from Last 3 Encounters:  05/25/22 141 lb 8 oz (64.2 kg)  03/31/22 142 lb 6 oz (64.6 kg)  03/23/22 141 lb (64 kg)      Physical Exam Constitutional:      General: She is not in acute distress.    Appearance: Normal appearance. She is well-developed. She is not ill-appearing or toxic-appearing.  HENT:     Head: Normocephalic.     Right Ear: Hearing, tympanic membrane, ear canal and external ear normal. Tympanic membrane is not erythematous, retracted or bulging.     Left Ear: Hearing, tympanic membrane, ear canal and external ear normal. Tympanic membrane is not erythematous, retracted or bulging.     Nose: No mucosal edema or rhinorrhea.     Right Sinus: No maxillary sinus tenderness or frontal sinus tenderness.     Left Sinus: No maxillary sinus tenderness or frontal sinus tenderness.     Mouth/Throat:     Pharynx: Uvula midline.  Eyes:     General: Lids are normal. Lids are everted, no foreign bodies  appreciated.     Conjunctiva/sclera: Conjunctivae normal.     Pupils: Pupils are equal, round, and reactive to light.  Neck:     Thyroid: No thyroid mass or thyromegaly.     Vascular: No carotid bruit.     Trachea: Trachea normal.  Cardiovascular:     Rate and Rhythm: Normal rate and regular rhythm.     Pulses: Normal pulses.     Heart sounds: Normal heart sounds, S1 normal and S2 normal. No murmur heard.    No friction rub. No gallop.  Pulmonary:     Effort: Pulmonary effort is normal. No  tachypnea or respiratory distress.     Breath sounds: Normal breath sounds. No decreased breath sounds, wheezing, rhonchi or rales.  Abdominal:     General: Bowel sounds are normal.     Palpations: Abdomen is soft.     Tenderness: There is no abdominal tenderness.  Musculoskeletal:     Cervical back: Normal range of motion and neck supple.  Skin:    General: Skin is warm and dry.     Findings: No rash.  Neurological:     Mental Status: She is alert.  Psychiatric:        Mood and Affect: Mood is not anxious or depressed.        Speech: Speech normal.        Behavior: Behavior normal. Behavior is cooperative.        Thought Content: Thought content normal.        Judgment: Judgment normal.       Results for orders placed or performed in visit on 04/12/22  HM DIABETES EYE EXAM  Result Value Ref Range   HM Diabetic Eye Exam No Retinopathy No Retinopathy    Assessment and Plan  EKG: unchanged from previous tracings, sinus bradycardia HR 49, no ST changes. Compared with 09/2021.Marland Kitchen HR 48  Atypical chest pain Assessment & Plan: Acute, Most clearly associated with eating and likely GERD. EKG in office with bradycardia but unchanged from previous.  No clear acute cardiac syndrome.   Orders: -     EKG 12-Lead  Jittery Assessment & Plan: Acute This seems to be associated with her dizziness.   During this does seem to be associated with irritability and fatigue. Blood pressures and  blood sugar during episode are in normal range.  I wonder if there is an element of anxiety but patient is a poor historian giving her Alzheimer's dementia.  We will try a very low-dose of alprazolam to use as needed.   Dizziness Assessment & Plan: Chronic, recurrent Some of the description of her symptoms could potentially be from her bradycardia so I have asked them to stop the metoprolol.  Of note husband reports when she has been off this for 5 days recently there was no improvement with dizziness.  Some of her symptoms today sound potentially consistent with vertigo, but not exactly consistent with BPPV. Recommended discussion with neurologist at her upcoming appointment with [facility] to determine if there is potentially a neurologic cause of her vertigo or if they feel it is primarily peripheral.    Bradycardia Assessment & Plan: This is a chronic issue for her and does not appear to be new.  It is likely secondary to her beta-blocker.  Given she is potentially symptomatic we will hold her beta-blocker and follow her heart rate and blood pressure at home.  Blood pressure goal is less than 150/90.  Her husband will contact me if her blood pressure increases above this off this medication.  If it does so I will consider increasing to a full tablet of the losartan hydrochlorothiazide. If bradycardia is persistent I have encouraged her to return to cardiology for their input.   Other orders -     ALPRAZolam; 1/2-1 tablet daily as needed for anxiety or jitteriness  Dispense: 20 tablet; Refill: 0   Of note recent lab evaluation for similar issues in October was unremarkable.  Return as scheduled.   [name], MD

## 2022-06-10 ENCOUNTER — Other Ambulatory Visit: Payer: Self-pay | Admitting: Family Medicine

## 2022-06-11 NOTE — Telephone Encounter (Signed)
Last office visit 05/25/22 for atypical chest pain.  Last refilled 05/25/22 for #20 with no refills.  Next Appt: 07/05/22 for DM and Dementia.

## 2022-06-22 DIAGNOSIS — H43813 Vitreous degeneration, bilateral: Secondary | ICD-10-CM | POA: Diagnosis not present

## 2022-06-22 DIAGNOSIS — H5203 Hypermetropia, bilateral: Secondary | ICD-10-CM | POA: Diagnosis not present

## 2022-06-22 DIAGNOSIS — H25813 Combined forms of age-related cataract, bilateral: Secondary | ICD-10-CM | POA: Diagnosis not present

## 2022-06-30 ENCOUNTER — Other Ambulatory Visit: Payer: Self-pay | Admitting: Family Medicine

## 2022-07-05 ENCOUNTER — Ambulatory Visit (INDEPENDENT_AMBULATORY_CARE_PROVIDER_SITE_OTHER): Payer: PPO | Admitting: Family Medicine

## 2022-07-05 ENCOUNTER — Encounter: Payer: Self-pay | Admitting: Family Medicine

## 2022-07-05 VITALS — BP 122/76 | HR 73 | Temp 97.3°F | Ht 62.25 in | Wt 143.0 lb

## 2022-07-05 DIAGNOSIS — R450 Nervousness: Secondary | ICD-10-CM | POA: Diagnosis not present

## 2022-07-05 DIAGNOSIS — L820 Inflamed seborrheic keratosis: Secondary | ICD-10-CM | POA: Diagnosis not present

## 2022-07-05 DIAGNOSIS — R001 Bradycardia, unspecified: Secondary | ICD-10-CM | POA: Diagnosis not present

## 2022-07-05 DIAGNOSIS — L84 Corns and callosities: Secondary | ICD-10-CM | POA: Diagnosis not present

## 2022-07-05 DIAGNOSIS — R42 Dizziness and giddiness: Secondary | ICD-10-CM

## 2022-07-05 NOTE — Assessment & Plan Note (Signed)
Acute, treat with moleskin padding.

## 2022-07-05 NOTE — Progress Notes (Signed)
Patient ID: Janet Chapman, female    DOB: 1935/08/18, 87 y.o.   MRN: [id number]  This visit was conducted in person.  BP 122/76 (BP Location: Left Arm, Patient Position: Sitting, Cuff Size: Normal)   Pulse 73   Temp (!) 97.3 F (36.3 C) (Temporal)   Ht 5' 2.25" (1.581 m)   Wt 143 lb (64.9 kg)   SpO2 98%   BMI 25.95 kg/m    CC:  Multiple complaints  Subjective:   HPI: Janet PEPPE is a 87 y.o. female presenting on 07/05/2022 for multiple issues.  GAD7:4  Anxiety (jitteriness  and dizziness) improved using alprazolam as needed... Has not had to use in last few weeks. No chest pain.   PHQ9: 1   Bradycardia.Marland Kitchen resolved off BBlocker ( metoprolol).. BP remains well controlled.   She has noted several area on skin.  Area on scalp.. recently more sore.  Callus.. sore area on base of left pinky toes.   Sore white area on lips.. noted 1 week ago, less there Has history of fever blisters.       Relevant past medical, surgical, family and social history reviewed and updated as indicated. Interim medical history since our last visit reviewed. Allergies and medications reviewed and updated. Outpatient Medications Prior to Visit  Medication Sig Dispense Refill   ALPRAZolam (XANAX) 0.25 MG tablet TAKE 1/2 TO 1 TABLET BY MOUTH DAILY AS NEEDED FOR ANXIETY OR JITTERINESS 20 tablet 0   Apoaequorin (PREVAGEN PO) Take 1 capsule by mouth every morning.     aspirin EC 81 MG tablet Take 81 mg by mouth daily with supper. Swallow whole.     cyanocobalamin (VITAMIN B12) 1000 MCG tablet Take 1,000 mcg by mouth daily.     fluticasone (FLONASE) 50 MCG/ACT nasal spray Place 2 sprays into both nostrils daily. 16 g 2   ketoconazole (NIZORAL) 2 % shampoo Apply 1 application. topically every 14 (fourteen) days.     losartan-hydrochlorothiazide (HYZAAR) 100-25 MG tablet TAKE 1/2 TABLET BY MOUTH DAILY 45 tablet 1   memantine (NAMENDA XR) 14 MG CP24 24 hr capsule Take 1 capsule (14 mg total) by  mouth daily. 30 capsule 5   montelukast (SINGULAIR) 10 MG tablet TAKE 1 TABLET BY MOUTH AT BEDTIME 30 tablet 2   potassium chloride SA (KLOR-CON M) 20 MEQ tablet Take 1 tablet (20 mEq total) by mouth 2 (two) times daily for 5 days. 10 tablet 0   simvastatin (ZOCOR) 40 MG tablet TAKE 1 TABLET BY MOUTH EACH NIGHT AT BEDTIME 90 tablet 0   No facility-administered medications prior to visit.     Per HPI unless specifically indicated in ROS section below Review of Systems  Constitutional:  Negative for fatigue and fever.  HENT:  Negative for congestion.   Eyes:  Negative for pain.  Respiratory:  Negative for cough and shortness of breath.   Cardiovascular:  Negative for chest pain, palpitations and leg swelling.  Gastrointestinal:  Negative for abdominal pain.  Genitourinary:  Negative for dysuria and vaginal bleeding.  Musculoskeletal:  Negative for back pain.  Neurological:  Negative for syncope, light-headedness and headaches.  Psychiatric/Behavioral:  Negative for dysphoric mood.    Objective:  BP 122/76 (BP Location: Left Arm, Patient Position: Sitting, Cuff Size: Normal)   Pulse 73   Temp (!) 97.3 F (36.3 C) (Temporal)   Ht 5' 2.25" (1.581 m)   Wt 143 lb (64.9 kg)   SpO2 98%   BMI 25.95 kg/m  Wt Readings from Last 3 Encounters:  07/05/22 143 lb (64.9 kg)  05/25/22 141 lb 8 oz (64.2 kg)  03/31/22 142 lb 6 oz (64.6 kg)      Physical Exam Constitutional:      General: She is not in acute distress.    Appearance: Normal appearance. She is well-developed. She is not ill-appearing or toxic-appearing.  HENT:     Head: Normocephalic.     Right Ear: Hearing, tympanic membrane, ear canal and external ear normal. Tympanic membrane is not erythematous, retracted or bulging.     Left Ear: Hearing, tympanic membrane, ear canal and external ear normal. Tympanic membrane is not erythematous, retracted or bulging.     Nose: No mucosal edema or rhinorrhea.     Right Sinus: No maxillary  sinus tenderness or frontal sinus tenderness.     Left Sinus: No maxillary sinus tenderness or frontal sinus tenderness.     Mouth/Throat:     Pharynx: Uvula midline.  Eyes:     General: Lids are normal. Lids are everted, no foreign bodies appreciated.     Conjunctiva/sclera: Conjunctivae normal.     Pupils: Pupils are equal, round, and reactive to light.  Neck:     Thyroid: No thyroid mass or thyromegaly.     Vascular: No carotid bruit.     Trachea: Trachea normal.  Cardiovascular:     Rate and Rhythm: Normal rate and regular rhythm.     Pulses: Normal pulses.     Heart sounds: Normal heart sounds, S1 normal and S2 normal. No murmur heard.    No friction rub. No gallop.  Pulmonary:     Effort: Pulmonary effort is normal. No tachypnea or respiratory distress.     Breath sounds: Normal breath sounds. No decreased breath sounds, wheezing, rhonchi or rales.  Abdominal:     General: Bowel sounds are normal.     Palpations: Abdomen is soft.     Tenderness: There is no abdominal tenderness.  Musculoskeletal:     Cervical back: Normal range of motion and neck supple.  Skin:    General: Skin is warm and dry.     Findings: No rash.     Comments: Irritated SK on scalp, irritated taste bud on tip of tongue, no lip lesion any longer, left foot with hammertoes and callus over her fifth metatarsal head.  Neurological:     Mental Status: She is alert.  Psychiatric:        Mood and Affect: Mood is not anxious or depressed.        Speech: Speech normal.        Behavior: Behavior normal. Behavior is cooperative.        Thought Content: Thought content normal.        Judgment: Judgment normal.       Results for orders placed or performed in visit on 04/12/22  HM DIABETES EYE EXAM  Result Value Ref Range   HM Diabetic Eye Exam No Retinopathy No Retinopathy    Assessment and Plan  Bradycardia Assessment & Plan: This issue has resolved off of metoprolol.  Blood pressure remains  well-controlled with 1 blood pressure medication: Losartan/hydrochlorothiazide   Jittery Assessment & Plan: Intermittent, now resolved in the last several weeks.  Improved significantly using alprazolam as needed for anxiety as possible cause.   Foot callus Assessment & Plan: Acute, treat with moleskin padding.   Seborrheic keratoses, inflamed Assessment & Plan: Acute, now improved.  Discussed using cortisone 10 on  area as on scalp if repeat irritation occurs.   Dizziness Assessment & Plan: Appears to be associated with spells of anxiety.  She has responded with improved symptoms using alprazolam prn jitteriness/dizziness and anxiety.     No follow-ups on file.   [name], MD

## 2022-07-05 NOTE — Assessment & Plan Note (Signed)
Intermittent, now resolved in the last several weeks.  Improved significantly using alprazolam as needed for anxiety as possible cause.

## 2022-07-05 NOTE — Assessment & Plan Note (Signed)
This issue has resolved off of metoprolol.  Blood pressure remains well-controlled with 1 blood pressure medication: Losartan/hydrochlorothiazide

## 2022-07-05 NOTE — Assessment & Plan Note (Signed)
Acute, now improved.  Discussed using cortisone 10 on area as on scalp if repeat irritation occurs.

## 2022-07-05 NOTE — Assessment & Plan Note (Signed)
Appears to be associated with spells of anxiety.  She has responded with improved symptoms using alprazolam prn jitteriness/dizziness and anxiety.

## 2022-07-11 ENCOUNTER — Ambulatory Visit (INDEPENDENT_AMBULATORY_CARE_PROVIDER_SITE_OTHER): Payer: PPO | Admitting: Neurology

## 2022-07-11 ENCOUNTER — Encounter: Payer: Self-pay | Admitting: Neurology

## 2022-07-11 VITALS — BP 146/70 | HR 73 | Ht 62.2 in | Wt 144.0 lb

## 2022-07-11 DIAGNOSIS — G301 Alzheimer's disease with late onset: Secondary | ICD-10-CM

## 2022-07-11 DIAGNOSIS — F02B0 Dementia in other diseases classified elsewhere, moderate, without behavioral disturbance, psychotic disturbance, mood disturbance, and anxiety: Secondary | ICD-10-CM | POA: Diagnosis not present

## 2022-07-11 MED ORDER — MEMANTINE HCL ER 14 MG PO CP24: 14.0000 mg | ORAL_CAPSULE | Freq: Every day | ORAL | 11 refills | Status: AC

## 2022-07-11 NOTE — Progress Notes (Signed)
PATIENT: Janet Chapman DOB: 1935/12/10  REASON FOR VISIT: Follow up for memory HISTORY FROM: Patient, husband PRIMARY NEUROLOGIST: Dr. [name] now will be followed by Dr. [name]  HISTORY OF PRESENT ILLNESS: Today 07/11/22 MMSE 24/30. PCP put her on Xanax 1/2 pill due to her anxiety, shakiness. We tried Namenda XR 21 mg daily, reports it made her dizzy. Memory has declined, when rushing hard time finding what she is looking for at the time. Is not driving. She does the cooking, housework. Had bladder infection, trying to drink more water. She takes Surveyor, mining. Does to bible study 1-2 times a week.   Update 01/04/22 SS: Janet Chapman is here today for follow-up.  Is taking Namenda XR 14 mg daily, no side effects.  Tolerating well. MMSE 23/30 today. Still short term memory trouble. Does her own ADLs.  She cooks and cleans. She likes to eat. No falls. Had UTI 2 months ago. Takes Miralax every 3rd day. Sleeps fair, sometimes can't fall asleep. Weight is same. Enjoys cleaning. Watches TV. Drives short distances, denies issues. Mood is good. Does repeat herself today.   Update 06/30/21 SS: Janet Chapman here today for follow-up with history of memory trouble.  Has been unable to tolerate memory medications well, is able to tolerate namenda 5 mg twice daily, but has to take with supper and then midnight. She thinks main issue is repeating herself. Does her own ADLs, drives, does housework, goes to church. For a few months they stopped namenda because of dizziness, her memory "nose dived". Good improvement when she went back on it. MMSE 19/30 today was 26/30 in August 2022. Here today with her husband.  HISTORY  12/15/2020 MM: Janet Chapman is an 87 year old female with a history of memory disturbance.  She returns today for follow-up.  The patient has tried multiple medications in the past but has not been able to tolerate them.  These medications include Exelon, Aricept and Namenda.  She feels that her memory has remained  stable.  She lives at home with her husband.  She is able to complete all ADLs independently.  She continues to complete all household chores.  She is able to drive to familiar places.  Denies any trouble sleeping.  She manages her own medications and appointments.  She also manages the finances without difficulty.  She returns today for an evaluation.  REVIEW OF SYSTEMS: Out of a complete 14 system review of symptoms, the patient complains only of the following symptoms, and all other reviewed systems are negative.  See HPI  ALLERGIES: Allergies  Allergen Reactions   Codeine Nausea Only    HOME MEDICATIONS: Outpatient Medications Prior to Visit  Medication Sig Dispense Refill   ALPRAZolam (XANAX) 0.25 MG tablet TAKE 1/2 TO 1 TABLET BY MOUTH DAILY AS NEEDED FOR ANXIETY OR JITTERINESS 20 tablet 0   Apoaequorin (PREVAGEN PO) Take 1 capsule by mouth every morning.     aspirin EC 81 MG tablet Take 81 mg by mouth daily with supper. Swallow whole.     cyanocobalamin (VITAMIN B12) 1000 MCG tablet Take 1,000 mcg by mouth daily.     fluticasone (FLONASE) 50 MCG/ACT nasal spray Place 2 sprays into both nostrils daily. 16 g 2   ketoconazole (NIZORAL) 2 % shampoo Apply 1 application. topically every 14 (fourteen) days.     losartan-hydrochlorothiazide (HYZAAR) 100-25 MG tablet TAKE 1/2 TABLET BY MOUTH DAILY 45 tablet 1   memantine (NAMENDA XR) 14 MG CP24 24 hr capsule Take 1  capsule (14 mg total) by mouth daily. 30 capsule 5   montelukast (SINGULAIR) 10 MG tablet TAKE 1 TABLET BY MOUTH AT BEDTIME 30 tablet 2   simvastatin (ZOCOR) 40 MG tablet TAKE 1 TABLET BY MOUTH EACH NIGHT AT BEDTIME 90 tablet 0   potassium chloride SA (KLOR-CON M) 20 MEQ tablet Take 1 tablet (20 mEq total) by mouth 2 (two) times daily for 5 days. 10 tablet 0   No facility-administered medications prior to visit.    PAST MEDICAL HISTORY: Past Medical History:  Diagnosis Date   Allergic rhinitis    Diabetes mellitus without  complication ([place])    diet controlled   GERD (gastroesophageal reflux disease)    Hyperlipidemia    Hypertension    Memory loss    Osteoarthritis    Urinary incontinence     PAST SURGICAL HISTORY: Past Surgical History:  Procedure Laterality Date   ABDOMINAL HYSTERECTOMY  1993   TA   CARDIOVASCULAR STRESS TEST  2004   H. [name], cardiolite-normal   CHOLECYSTECTOMY  [id number]   COLONOSCOPY     LEFT HEART CATH AND CORONARY ANGIOGRAPHY N/A 11/06/2017   Procedure: LEFT HEART CATH AND CORONARY ANGIOGRAPHY;  Surgeon: Burnell Blanks, MD;  Location: [facility];  Service: Cardiovascular;  Laterality: N/A;    FAMILY HISTORY: Family History  Problem Relation Age of Onset   Heart attack Father 54   Dementia Mother    Stroke Mother 43   Ovarian cancer Sister 54   Diabetes Other        Maternal side   Ovarian cancer Other        Grandmother   Ovarian cancer Other        aunt   Colon cancer Neg Hx     SOCIAL HISTORY: Social History   Socioeconomic History   Marital status: Married    Spouse name: Not on file   Number of children: 0   Years of education: Not on file   Highest education level: Not on file  Occupational History   Occupation: retired  Tobacco Use   Smoking status: Never   Smokeless tobacco: Never   Tobacco comments:    In college  Vaping Use   Vaping Use: Never used  Substance and Sexual Activity   Alcohol use: Not Currently   Drug use: No   Sexual activity: Never  Other Topics Concern   Not on file  Social History Narrative    Regular exercise: yes, Curves 2x a week   Married 48 + years    Diet: fruit and veggies, no FF, some water, artificial sweetener   HCPOA: [name], has living will, full code ( reviewed 2014)            Social Determinants of Health   Financial Resource Strain: Low Risk  (03/23/2022)   Overall Financial Resource Strain (CARDIA)    Difficulty of Paying Living Expenses: Not hard at all  Food Insecurity:  No Food Insecurity (03/23/2022)   Hunger Vital Sign    Worried About Running Out of Food in the Last Year: Never true    [place] in the Last Year: Never true  Transportation Needs: No Transportation Needs (03/23/2022)   PRAPARE - Hydrologist (Medical): No    Lack of Transportation (Non-Medical): No  Physical Activity: Insufficiently Active (03/23/2022)   Exercise Vital Sign    Days of Exercise per Week: 3 days  Minutes of Exercise per Session: 30 min  Stress: No Stress Concern Present (03/23/2022)   [place]    Feeling of Stress : Not at all  Social Connections: Moderately Isolated (03/23/2022)   Social Connection and Isolation Panel [NHANES]    Frequency of Communication with Friends and Family: Never    Frequency of Social Gatherings with Friends and Family: Once a week    Attends Religious Services: More than 4 times per year    Active Member of [employer] or Organizations: No    Attends Archivist Meetings: Never    Marital Status: Married  Human resources officer Violence: Not At Risk (03/23/2022)   Humiliation, Afraid, Rape, and Kick questionnaire    Fear of Current or Ex-Partner: No    Emotionally Abused: No    Physically Abused: No    Sexually Abused: No   PHYSICAL EXAM  Vitals:   07/11/22 1258  BP: (!) 146/70  Pulse: 73  Weight: 144 lb (65.3 kg)  Height: 5' 2.2" (1.58 m)   Body mass index is 26.17 kg/m.  Generalized: Well developed, in no acute distress, well dressed, put together     07/11/2022    1:04 PM 01/04/2022    1:17 PM 06/30/2021   10:33 AM  MMSE - Mini Mental State Exam  Orientation to time '3 3 3  '$ Orientation to Place '5 4 3  '$ Registration '3 3 3  '$ Attention/ Calculation '4 4 1  '$ Recall 0 0 0  Language- name 2 objects '2 2 2  '$ Language- repeat '1 1 1  '$ Language- follow 3 step command '3 3 3  '$ Language- read & follow direction '1 1 1  '$ Write a sentence '1 1 1   '$ Copy design '1 1 1  '$ Total score '24 23 19   '$ Neurological examination  Mentation: Alert oriented to time, place, history taking. Follows all commands speech and language fluent.  Does look to her husband for reassurance about questioning.  Cranial nerve II-XII: Pupils were equal round reactive to light. Extraocular movements were full, visual field were full on confrontational test. Facial sensation and strength were normal. Head turning and shoulder shrug  were normal and symmetric. Motor: The motor testing reveals 5 over 5 strength of all 4 extremities. Good symmetric motor tone is noted throughout.  Sensory: Sensory testing is intact to soft touch on all 4 extremities. No evidence of extinction is noted.  Coordination: Cerebellar testing reveals good finger-nose-finger and heel-to-shin bilaterally.  Gait and station: Gait is normal. Reflexes: Deep tendon reflexes are symmetric but decreased  DIAGNOSTIC DATA (LABS, IMAGING, TESTING) - I reviewed patient records, labs, notes, testing and imaging myself where available.  Lab Results  Component Value Date   WBC 5.9 02/28/2022   HGB 14.2 02/28/2022   HCT 42.7 02/28/2022   MCV 96.1 02/28/2022   PLT 214.0 02/28/2022      Component Value Date/Time   NA 139 02/28/2022 1140   NA 138 09/04/2018 1537   K 3.6 02/28/2022 1140   CL 97 02/28/2022 1140   CO2 36 (H) 02/28/2022 1140   GLUCOSE 113 (H) 02/28/2022 1140   BUN 14 02/28/2022 1140   BUN 20 09/04/2018 1537   CREATININE 0.79 02/28/2022 1140   CALCIUM 10.4 02/28/2022 1140   PROT 6.9 02/28/2022 1140   ALBUMIN 4.8 02/28/2022 1140   AST 13 02/28/2022 1140   ALT 11 02/28/2022 1140   ALKPHOS 60 02/28/2022 1140   BILITOT 0.8  02/28/2022 1140   GFRNONAA >60 09/27/2021 1135   GFRAA 73 09/04/2018 1537   Lab Results  Component Value Date   CHOL 288 (H) 03/24/2021   HDL 81.20 03/24/2021   LDLCALC 184 (H) 03/24/2021   LDLDIRECT 119.8 04/26/2009   TRIG 116.0 03/24/2021   CHOLHDL 4  03/24/2021   Lab Results  Component Value Date   HGBA1C 6.0 (A) 03/31/2022   Lab Results  Component Value Date   VITAMINB12 164 (L) 02/28/2022   Lab Results  Component Value Date   TSH 1.99 02/28/2022   ASSESSMENT AND PLAN 87 y.o. year old female   1.  Dementia   -No major changes. MMSE 24/30 -Continue Namenda XR 14 mg daily, 21 mg made her dizzy  -Has not been able to tolerate Exelon or Aricept -Encouraged to implement brain stimulating exercises, consistent physical activity -She would like to follow-up in 6 months  Evangeline Dakin, [facility] [address] [place], [place] [address] [phone]

## 2022-07-11 NOTE — Patient Instructions (Signed)
Continue the Namenda Increase exercise, brain stimulating activity  See you back in 6 months

## 2022-08-15 ENCOUNTER — Other Ambulatory Visit: Payer: Self-pay | Admitting: Family Medicine

## 2022-09-12 ENCOUNTER — Other Ambulatory Visit: Payer: Self-pay | Admitting: Family Medicine

## 2022-09-12 NOTE — Telephone Encounter (Signed)
Last office visit 07/05/2022 for bradycardia.  Last refilled 06/12/2022 for #20 with no refills.  Next Appt: No future appointments with PCP.

## 2022-09-21 ENCOUNTER — Telehealth: Payer: Self-pay | Admitting: Family Medicine

## 2022-09-21 DIAGNOSIS — L820 Inflamed seborrheic keratosis: Secondary | ICD-10-CM

## 2022-09-21 NOTE — Telephone Encounter (Signed)
Patient husband called in and stated that Janet Chapman needs a referral to be put in to [facility] with Dr. Doreen Beam in regards to the spot on her head. Thank you!

## 2022-09-21 NOTE — Addendum Note (Signed)
Addended by: Kerby Nora E on: 09/21/2022 05:03 PM   Modules accepted: Orders

## 2022-09-21 NOTE — Telephone Encounter (Signed)
Let patient's husband know referral sent.

## 2022-09-21 NOTE — Telephone Encounter (Signed)
Mr. Fonseca notified by telephone that Dr. Ermalene Searing has placed the dermatology referral and someone will be reaching out to him with that appointment.

## 2022-10-06 ENCOUNTER — Other Ambulatory Visit: Payer: Self-pay | Admitting: Family Medicine

## 2022-10-06 NOTE — Telephone Encounter (Signed)
Last office visit 07/05/2022 for bradycardia, jittery, foot callus, dizziness and seborrheic keratoses, inflamed.  Last refilled Alprazolam. 09/12/2022 for #20 with no refills.  Simvastatin:  Last Lipid 03/24/2021.  Next Appt: No future appointments with PCP.

## 2022-10-06 NOTE — Telephone Encounter (Signed)
Patient husband called in and stated that she is down to one pill. He stated that he was wondering if this could be sent in today so he can pick it up tomorrow. Thank you!

## 2022-10-20 ENCOUNTER — Other Ambulatory Visit: Payer: Self-pay | Admitting: Family Medicine

## 2022-10-20 NOTE — Telephone Encounter (Signed)
Called patient to verify that she needs refill. Patient  and husband that they have received refill from May. She states she is taking 1 to 1 1/2 every day. She states that 2-3 times a week she takes the 1 1/2. States that symptoms are increasing. Patient and husband states that incased symptoms started about 2-3 months ago. Patient has 4 tab left

## 2022-10-26 ENCOUNTER — Other Ambulatory Visit: Payer: Self-pay | Admitting: Family Medicine

## 2022-10-31 ENCOUNTER — Ambulatory Visit (INDEPENDENT_AMBULATORY_CARE_PROVIDER_SITE_OTHER): Payer: PPO | Admitting: Internal Medicine

## 2022-10-31 ENCOUNTER — Encounter: Payer: Self-pay | Admitting: Internal Medicine

## 2022-10-31 VITALS — BP 124/60 | HR 74 | Temp 98.0°F | Ht 62.2 in | Wt 145.2 lb

## 2022-10-31 DIAGNOSIS — F39 Unspecified mood [affective] disorder: Secondary | ICD-10-CM | POA: Diagnosis not present

## 2022-10-31 DIAGNOSIS — F411 Generalized anxiety disorder: Secondary | ICD-10-CM | POA: Insufficient documentation

## 2022-10-31 MED ORDER — SERTRALINE HCL 25 MG PO TABS
25.0000 mg | ORAL_TABLET | Freq: Every day | ORAL | 1 refills | Status: DC
Start: 2022-10-31 — End: 2022-12-07

## 2022-10-31 NOTE — Progress Notes (Signed)
Subjective:    Patient ID: Janet Chapman, female    DOB: 1936/03/07, 87 y.o.   MRN: [id number]  HPI Here due to increasing anxiety With husband  Has had anxiety for 1-1.5 years Xanax helps but then it wears out Has increased xanax---now taking tid---occasionally needs full tab  Husband feels the anxiety is related to memory loss This has progressed over time Already on memantine Still helps with housework, etc--but husband now mostly doing this  Gets frustrated easily--but not clearly depressed Enjoys going out to eat Does read/watches TV/church  Current Outpatient Medications on File Prior to Visit  Medication Sig Dispense Refill   ALPRAZolam (XANAX) 0.25 MG tablet TAKE 1/2 TO 1 TABLET BY MOUTH DAILY AS NEEDED FOR ANXIETY OR JITTERINESS 20 tablet 0   Apoaequorin (PREVAGEN PO) Take 1 capsule by mouth every morning.     aspirin EC 81 MG tablet Take 81 mg by mouth daily with supper. Swallow whole.     cyanocobalamin (VITAMIN B12) 1000 MCG tablet Take 1,000 mcg by mouth daily.     fluticasone (FLONASE) 50 MCG/ACT nasal spray Place 2 sprays into both nostrils daily. 16 g 2   ketoconazole (NIZORAL) 2 % shampoo Apply 1 application. topically every 14 (fourteen) days.     losartan-hydrochlorothiazide (HYZAAR) 100-25 MG tablet TAKE 1/2 TABLET BY MOUTH DAILY 45 tablet 1   memantine (NAMENDA XR) 14 MG CP24 24 hr capsule Take 1 capsule (14 mg total) by mouth daily. 30 capsule 11   montelukast (SINGULAIR) 10 MG tablet TAKE 1 TABLET BY MOUTH AT BEDTIME 30 tablet 2   simvastatin (ZOCOR) 40 MG tablet TAKE 1 TABLET BY MOUTH EACH NIGHT AT BEDTIME 90 tablet 0   No current facility-administered medications on file prior to visit.    Allergies  Allergen Reactions   Codeine Nausea Only    Past Medical History:  Diagnosis Date   Allergic rhinitis    Diabetes mellitus without complication (HCC)    diet controlled   GERD (gastroesophageal reflux disease)    Hyperlipidemia    Hypertension     Memory loss    Osteoarthritis    Urinary incontinence     Past Surgical History:  Procedure Laterality Date   ABDOMINAL HYSTERECTOMY  1993   TA   CARDIOVASCULAR STRESS TEST  2004   H. Katrinka Blazing, cardiolite-normal   CHOLECYSTECTOMY  6440'H   COLONOSCOPY     LEFT HEART CATH AND CORONARY ANGIOGRAPHY N/A 11/06/2017   Procedure: LEFT HEART CATH AND CORONARY ANGIOGRAPHY;  Surgeon: Kathleene Hazel, MD;  Location: [facility];  Service: Cardiovascular;  Laterality: N/A;    Family History  Problem Relation Age of Onset   Heart attack Father 75   Dementia Mother    Stroke Mother 22   Ovarian cancer Sister 63   Diabetes Other        Maternal side   Ovarian cancer Other        Grandmother   Ovarian cancer Other        aunt   Colon cancer Neg Hx     Social History   Socioeconomic History   Marital status: Married    Spouse name: Not on file   Number of children: 0   Years of education: Not on file   Highest education level: Not on file  Occupational History   Occupation: retired  Tobacco Use   Smoking status: Never   Smokeless tobacco: Never   Tobacco comments:  In college  Vaping Use   Vaping Use: Never used  Substance and Sexual Activity   Alcohol use: Not Currently   Drug use: No   Sexual activity: Never  Other Topics Concern   Not on file  Social History Narrative    Regular exercise: yes, Curves 2x a week   Married 48 + years    Diet: fruit and veggies, no FF, some water, artificial sweetener   HCPOA: Remus Loffler, has living will, full code ( reviewed 2014)            Social Determinants of Health   Financial Resource Strain: Low Risk  (03/23/2022)   Overall Financial Resource Strain (CARDIA)    Difficulty of Paying Living Expenses: Not hard at all  Food Insecurity: No Food Insecurity (03/23/2022)   Hunger Vital Sign    Worried About Running Out of Food in the Last Year: Never true    Ran Out of Food in the Last Year: Never true   Transportation Needs: No Transportation Needs (03/23/2022)   PRAPARE - Administrator, Civil Service (Medical): No    Lack of Transportation (Non-Medical): No  Physical Activity: Insufficiently Active (03/23/2022)   Exercise Vital Sign    Days of Exercise per Week: 3 days    Minutes of Exercise per Session: 30 min  Stress: No Stress Concern Present (03/23/2022)   [employer] of Occupational Health - Occupational Stress Questionnaire    Feeling of Stress : Not at all  Social Connections: Moderately Isolated (03/23/2022)   Social Connection and Isolation Panel [NHANES]    Frequency of Communication with Friends and Family: Never    Frequency of Social Gatherings with Friends and Family: Once a week    Attends Religious Services: More than 4 times per year    Active Member of [employer] or Organizations: No    Attends Banker Meetings: Never    Marital Status: Married  Catering manager Violence: Not At Risk (03/23/2022)   Humiliation, Afraid, Rape, and Kick questionnaire    Fear of Current or Ex-Partner: No    Emotionally Abused: No    Physically Abused: No    Sexually Abused: No   Review of Systems Sleeps okay Appetite is good Weight is stable     Objective:   Physical Exam Constitutional:      Appearance: Normal appearance.  Neurological:     Mental Status: She is alert.  Psychiatric:     Comments: Does interact--not really passive No depression No overt anxiety            Assessment & Plan:

## 2022-10-31 NOTE — Assessment & Plan Note (Signed)
Has had increasing frustration and anxiety ---associated with her dementia No real depression Has been using the xanax 1/2 tab tid regularly--but it wears off (as expected) Already on memantine Discussed alternatives Will try sertraline 25---some increased fall risk (but probably less than the regular xanax). Can still use xanax prn Set up with Dr B in 1 month and consider titration then Husband will let us know if any problems

## 2022-11-02 ENCOUNTER — Telehealth: Payer: Self-pay | Admitting: Neurology

## 2022-11-02 NOTE — Telephone Encounter (Signed)
Pt's husband, Lanautica Winchell , pt complaining of shivering inside of body and not able to sleep. Would like a call back.

## 2022-11-02 NOTE — Telephone Encounter (Signed)
Call to husband, advised him to follow up with prescribing doctors for those medications for symptoms discussed. Husband in agreement to call PCP office back

## 2022-11-02 NOTE — Telephone Encounter (Signed)
Would kindly ask her to follow up with prescribing providers of those medications. Thanks

## 2022-11-02 NOTE — Telephone Encounter (Signed)
Call to husband who states patient has jitters and trembling and can't sleep. Husbands states she is having hot flashes too. He is giving her 1/2 of 0.25mg  xanax  up to 3x days. She is also getting 25mg  sertraline but doesn't;t feel that is helping. Education provided that sertraline can take several weeks to get into system and see medication benefits. Advised I would let Maralyn Sago know and see if she had any other recommendations/advise. Husband appreciative of call.

## 2022-11-08 ENCOUNTER — Encounter: Payer: Self-pay | Admitting: Family Medicine

## 2022-11-08 ENCOUNTER — Ambulatory Visit (INDEPENDENT_AMBULATORY_CARE_PROVIDER_SITE_OTHER): Payer: PPO | Admitting: Family Medicine

## 2022-11-08 VITALS — BP 110/72 | HR 76 | Temp 97.9°F | Ht 62.2 in | Wt 145.0 lb

## 2022-11-08 DIAGNOSIS — F02B0 Dementia in other diseases classified elsewhere, moderate, without behavioral disturbance, psychotic disturbance, mood disturbance, and anxiety: Secondary | ICD-10-CM | POA: Diagnosis not present

## 2022-11-08 DIAGNOSIS — G301 Alzheimer's disease with late onset: Secondary | ICD-10-CM

## 2022-11-08 DIAGNOSIS — F39 Unspecified mood [affective] disorder: Secondary | ICD-10-CM | POA: Diagnosis not present

## 2022-11-08 MED ORDER — ALPRAZOLAM 0.25 MG PO TABS: ORAL_TABLET | ORAL | 0 refills | Status: DC

## 2022-11-08 NOTE — Progress Notes (Signed)
Patient ID: Janet Chapman, female    DOB: 1935-11-10, 87 y.o.   MRN: [id number]  This visit was conducted in person.  BP 110/72 (BP Location: Left Arm, Patient Position: Sitting, Cuff Size: Normal)   Pulse 76   Temp 97.9 F (36.6 C) (Temporal)   Ht 5' 2.2" (1.58 m)   Wt 145 lb (65.8 kg)   SpO2 96%   BMI 26.35 kg/m    CC:  Chief Complaint  Patient presents with   Anxiety    Zoloft has not helped at all yet; was given this last week by Dr. Alphonsus Sias.    Subjective:   HPI: Janet Chapman is a 87 y.o. female with history of dementia presenting on 11/08/2022 for Anxiety (Zoloft has not helped at all yet; was given this last week by Dr. Alphonsus Sias.)  Recent office visit with Dr. Alphonsus Sias on June 18 reviewed. Patient and husband had reported increasing anxiety felt to be secondary to memory loss.  Patient gets frustrated easily.  She seems to be very shaky. Xanax half tablet 3 times daily unhelpful, as it wears off.... has ben having to use this more frequently in last 3 weeks.  Started on sertraline 25 mg daily.  No SE to this medication.  She is sleeping well, was waking at 2-3 Am needing additional anxiety med... last night she did wake like she has been doing for last 3 months. Per husband this is not yet helped.       Relevant past medical, surgical, family and social history reviewed and updated as indicated. Interim medical history since our last visit reviewed. Allergies and medications reviewed and updated. Outpatient Medications Prior to Visit  Medication Sig Dispense Refill   Apoaequorin (PREVAGEN PO) Take 1 capsule by mouth every morning.     aspirin EC 81 MG tablet Take 81 mg by mouth daily with supper. Swallow whole.     cyanocobalamin (VITAMIN B12) 1000 MCG tablet Take 1,000 mcg by mouth daily.     fluticasone (FLONASE) 50 MCG/ACT nasal spray Place 2 sprays into both nostrils daily. 16 g 2   ketoconazole (NIZORAL) 2 % shampoo Apply 1 application. topically every 14  (fourteen) days.     losartan-hydrochlorothiazide (HYZAAR) 100-25 MG tablet TAKE 1/2 TABLET BY MOUTH DAILY 45 tablet 1   memantine (NAMENDA XR) 14 MG CP24 24 hr capsule Take 1 capsule (14 mg total) by mouth daily. 30 capsule 11   montelukast (SINGULAIR) 10 MG tablet TAKE 1 TABLET BY MOUTH AT BEDTIME 30 tablet 2   sertraline (ZOLOFT) 25 MG tablet Take 1 tablet (25 mg total) by mouth daily. 30 tablet 1   simvastatin (ZOCOR) 40 MG tablet TAKE 1 TABLET BY MOUTH EACH NIGHT AT BEDTIME 90 tablet 0   ALPRAZolam (XANAX) 0.25 MG tablet TAKE 1/2 TO 1 TABLET BY MOUTH DAILY AS NEEDED FOR ANXIETY OR JITTERINESS 20 tablet 0   No facility-administered medications prior to visit.     Per HPI unless specifically indicated in ROS section below Review of Systems  Constitutional:  Negative for fatigue and fever.  HENT:  Negative for congestion.   Eyes:  Negative for pain.  Respiratory:  Negative for cough and shortness of breath.   Cardiovascular:  Negative for chest pain, palpitations and leg swelling.  Gastrointestinal:  Negative for abdominal pain.  Genitourinary:  Negative for dysuria and vaginal bleeding.  Musculoskeletal:  Negative for back pain.  Neurological:  Negative for syncope, light-headedness and headaches.  Psychiatric/Behavioral:  Negative for dysphoric mood.    Objective:  BP 110/72 (BP Location: Left Arm, Patient Position: Sitting, Cuff Size: Normal)   Pulse 76   Temp 97.9 F (36.6 C) (Temporal)   Ht 5' 2.2" (1.58 m)   Wt 145 lb (65.8 kg)   SpO2 96%   BMI 26.35 kg/m   Wt Readings from Last 3 Encounters:  11/08/22 145 lb (65.8 kg)  10/31/22 145 lb 3.2 oz (65.9 kg)  07/11/22 144 lb (65.3 kg)      Physical Exam Constitutional:      General: She is not in acute distress.    Appearance: Normal appearance. She is well-developed. She is not ill-appearing or toxic-appearing.  HENT:     Head: Normocephalic.     Right Ear: Hearing, tympanic membrane, ear canal and external ear  normal. Tympanic membrane is not erythematous, retracted or bulging.     Left Ear: Hearing, tympanic membrane, ear canal and external ear normal. Tympanic membrane is not erythematous, retracted or bulging.     Nose: No mucosal edema or rhinorrhea.     Right Sinus: No maxillary sinus tenderness or frontal sinus tenderness.     Left Sinus: No maxillary sinus tenderness or frontal sinus tenderness.     Mouth/Throat:     Pharynx: Uvula midline.  Eyes:     General: Lids are normal. Lids are everted, no foreign bodies appreciated.     Conjunctiva/sclera: Conjunctivae normal.     Pupils: Pupils are equal, round, and reactive to light.  Neck:     Thyroid: No thyroid mass or thyromegaly.     Vascular: No carotid bruit.     Trachea: Trachea normal.  Cardiovascular:     Rate and Rhythm: Normal rate and regular rhythm.     Pulses: Normal pulses.     Heart sounds: Normal heart sounds, S1 normal and S2 normal. No murmur heard.    No friction rub. No gallop.  Pulmonary:     Effort: Pulmonary effort is normal. No tachypnea or respiratory distress.     Breath sounds: Normal breath sounds. No decreased breath sounds, wheezing, rhonchi or rales.  Abdominal:     General: Bowel sounds are normal.     Palpations: Abdomen is soft.     Tenderness: There is no abdominal tenderness.  Musculoskeletal:     Cervical back: Normal range of motion and neck supple.  Skin:    General: Skin is warm and dry.     Findings: No rash.  Neurological:     Mental Status: She is alert.  Psychiatric:        Mood and Affect: Mood is not anxious or depressed.        Speech: Speech normal.        Behavior: Behavior normal. Behavior is cooperative.        Thought Content: Thought content normal.        Judgment: Judgment normal.       Results for orders placed or performed in visit on 04/12/22  HM DIABETES EYE EXAM  Result Value Ref Range   HM Diabetic Eye Exam No Retinopathy No Retinopathy    Assessment and  Plan  Mood disorder [facility]) Assessment & Plan: Chronic, generalized anxiety likely the cause of her jitteriness and shaking. Will provide temporary prescription of alprazolam to use half a tablet 4 times a day as needed until sertraline 25 mg daily is beginning to work. Encouraged her to take the sertraline at bedtime to help  with sleep.  They will keep follow-up as planned in 3 to 4 weeks for reassessment and possible increase in medication dose.  We did discuss in detail temporary use of benzodiazepine.  Reviewed increased risks associated past age 61.    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

## 2022-11-08 NOTE — Assessment & Plan Note (Signed)
Chronic, generalized anxiety likely the cause of her jitteriness and shaking. Will provide temporary prescription of alprazolam to use half a tablet 4 times a day as needed until sertraline 25 mg daily is beginning to work. Encouraged her to take the sertraline at bedtime to help with sleep.  They will keep follow-up as planned in 3 to 4 weeks for reassessment and possible increase in medication dose.  We did discuss in detail temporary use of benzodiazepine.  Reviewed increased risks associated past age 85.

## 2022-11-10 ENCOUNTER — Telehealth: Payer: Self-pay | Admitting: Family Medicine

## 2022-11-10 ENCOUNTER — Other Ambulatory Visit: Payer: Self-pay | Admitting: Family Medicine

## 2022-11-10 NOTE — Telephone Encounter (Signed)
Spoke with Pharmacist.  He states the new Rx that was sent in was a decrease in dosing from her previous prescription.  He states the earliest they can fill it would be on Monday.  I spoke with [name].  He states she only has 1/2 tablet left and is very shaky.   Please advise.

## 2022-11-10 NOTE — Telephone Encounter (Signed)
Please advise 

## 2022-11-10 NOTE — Telephone Encounter (Signed)
Pt's husband, called stating the pharmacy won't refill the meds, ALPRAZolam (XANAX) 0.25 MG tablet, unless Dr. Ermalene Searing sends in a confirmation for the refill. Pt's husband stated the pharmacy mentioned how it was too soon for the pt to receive another refill. Call back # [phone]

## 2022-11-10 NOTE — Telephone Encounter (Signed)
Called pharmacist to discuss as per our records rx for alprazolam from 6/7 stated 1/2-1 tab daily #20  And now change was for 1/2 tab every 6 hours... total of 2 tabs daily.  Pharmacist reviewed again and agrees that there should be no hold on filling the medication.   He states text will be sent to let pt and husband know rx will go through now.  No change made with rx.

## 2022-11-10 NOTE — Telephone Encounter (Signed)
Please call the pharmacy and verify that we did make this prescription medication change.  That is why the prescription is early as we have increased the dosing temporarily with the plan to have her wean off of it as the sertraline kicks in.

## 2022-11-28 ENCOUNTER — Other Ambulatory Visit: Payer: Self-pay | Admitting: Family Medicine

## 2022-12-07 ENCOUNTER — Encounter: Payer: Self-pay | Admitting: Family Medicine

## 2022-12-07 ENCOUNTER — Ambulatory Visit (INDEPENDENT_AMBULATORY_CARE_PROVIDER_SITE_OTHER): Payer: PPO | Admitting: Family Medicine

## 2022-12-07 VITALS — BP 110/62 | HR 62 | Temp 97.5°F | Resp 16 | Ht 62.2 in | Wt 145.1 lb

## 2022-12-07 DIAGNOSIS — E1159 Type 2 diabetes mellitus with other circulatory complications: Secondary | ICD-10-CM | POA: Diagnosis not present

## 2022-12-07 DIAGNOSIS — I152 Hypertension secondary to endocrine disorders: Secondary | ICD-10-CM | POA: Diagnosis not present

## 2022-12-07 DIAGNOSIS — F39 Unspecified mood [affective] disorder: Secondary | ICD-10-CM

## 2022-12-07 LAB — POCT GLYCOSYLATED HEMOGLOBIN (HGB A1C): Hemoglobin A1C: 5.9 % — AB (ref 4.0–5.6)

## 2022-12-07 MED ORDER — SERTRALINE HCL 50 MG PO TABS: 50.0000 mg | ORAL_TABLET | Freq: Every day | ORAL | 3 refills | Status: AC

## 2022-12-07 NOTE — Progress Notes (Signed)
Patient ID: Janet Chapman, female    DOB: 10-30-1935, 87 y.o.   MRN: [id number]  This visit was conducted in person.  BP 110/62   Pulse 62   Temp (!) 97.5 F (36.4 C)   Resp 16   Ht 5' 2.2" (1.58 m)   Wt 145 lb 2 oz (65.8 kg)   SpO2 96%   BMI 26.37 kg/m    CC:  Chief Complaint  Patient presents with   Medication Dose Change    1 month follow up     Subjective:   HPI: Janet Chapman is a 87 y.o. female with history of dementia presenting on 12/07/2022 for Medication Dose Change (1 month follow up )  Recent office visit with Dr. Alphonsus Sias on June 18 reviewed. At 11/08/22 follow up: Patient and husband had reported increasing anxiety felt to be secondary to memory loss.  Patient gets frustrated easily.  She seems to be very shaky. Xanax half tablet 3 times daily unhelpful, as it wears off.... has ben having to use this more frequently in last 3 weeks. Started on sertraline 25 mg daily.  No SE to this medication.   12/07/22  At today's appointment she has been on sertraline 25 mg daily for the last month. Has been reports minimal improvement with jitteriness and anxiety.  He has noted she seems more active and motivated in the last few weeks though. No side effects to sertraline  Still needing 1/2 tablet to full tablet in Am and another 1/2 tablet later in the day. Sleeping well at night.  Diabetes:  Due for A1C today Using medications without difficulties: Hypoglycemic episodes: none Hyperglycemic episodes: none Feet problems: none Blood Sugars averaging: Not checking eye exam within last year: Done        12/07/2022   11:18 AM 11/08/2022   11:08 AM 10/31/2022   11:16 AM 07/05/2022   11:01 AM  GAD 7 : Generalized Anxiety Score  Nervous, Anxious, on Edge 0 3 3 2   Control/stop worrying 0 1 3 2   Worry too much - different things 1 1 1 2   Trouble relaxing 0 0 1 0  Restless 0 0 0 0  Easily annoyed or irritable 1 1 1 2   Afraid - awful might happen 0 0 0 0  Total  GAD 7 Score 2 6 9 8   Anxiety Difficulty Not difficult at all Not difficult at all  Not difficult at all         12/07/2022   11:18 AM 11/08/2022   11:08 AM 10/31/2022   11:16 AM 07/05/2022   11:00 AM 03/23/2022   11:28 AM  Depression screen PHQ 2/9  Decreased Interest 0 1 0 0 0  Down, Depressed, Hopeless 0 0 1 0 0  PHQ - 2 Score 0 1 1 0 0  Altered sleeping 0 0 1 0 0  Tired, decreased energy 2 2 1 1  0  Change in appetite 0 0 0 0 0  Feeling bad or failure about yourself  0 0 1 0 0  Trouble concentrating 0 0 0 0 0  Moving slowly or fidgety/restless 0 0 0 0 0  Suicidal thoughts 0 0 0 0 0  PHQ-9 Score 2 3 4 1  0  Difficult doing work/chores Somewhat difficult Not difficult at all Somewhat difficult Not difficult at all Not difficult at all            Relevant past medical, surgical, family and social history reviewed  and updated as indicated. Interim medical history since our last visit reviewed. Allergies and medications reviewed and updated. Outpatient Medications Prior to Visit  Medication Sig Dispense Refill   ALPRAZolam (XANAX) 0.25 MG tablet TAKE 1/2   tablet every 6 hours AS NEEDED FOR ANXIETY OR JITTERINESS 60 tablet 0   Apoaequorin (PREVAGEN PO) Take 1 capsule by mouth every morning.     aspirin EC 81 MG tablet Take 81 mg by mouth daily with supper. Swallow whole.     cyanocobalamin (VITAMIN B12) 1000 MCG tablet Take 1,000 mcg by mouth daily.     fluticasone (FLONASE) 50 MCG/ACT nasal spray Place 2 sprays into both nostrils daily. 16 g 2   ketoconazole (NIZORAL) 2 % shampoo Apply 1 application. topically every 14 (fourteen) days.     losartan-hydrochlorothiazide (HYZAAR) 100-25 MG tablet TAKE 1/2 TABLET BY MOUTH DAILY 45 tablet 1   memantine (NAMENDA XR) 14 MG CP24 24 hr capsule Take 1 capsule (14 mg total) by mouth daily. 30 capsule 11   montelukast (SINGULAIR) 10 MG tablet TAKE 1 TABLET BY MOUTH AT BEDTIME 30 tablet 2   simvastatin (ZOCOR) 40 MG tablet TAKE 1 TABLET BY  MOUTH EACH NIGHT AT BEDTIME 90 tablet 0   sertraline (ZOLOFT) 25 MG tablet Take 1 tablet (25 mg total) by mouth daily. 30 tablet 1   No facility-administered medications prior to visit.     Per HPI unless specifically indicated in ROS section below Review of Systems  Constitutional:  Negative for fatigue and fever.  HENT:  Negative for congestion.   Eyes:  Negative for pain.  Respiratory:  Negative for cough and shortness of breath.   Cardiovascular:  Negative for chest pain, palpitations and leg swelling.  Gastrointestinal:  Negative for abdominal pain.  Genitourinary:  Negative for dysuria and vaginal bleeding.  Musculoskeletal:  Negative for back pain.  Neurological:  Negative for syncope, light-headedness and headaches.  Psychiatric/Behavioral:  Negative for dysphoric mood.    Objective:  BP 110/62   Pulse 62   Temp (!) 97.5 F (36.4 C)   Resp 16   Ht 5' 2.2" (1.58 m)   Wt 145 lb 2 oz (65.8 kg)   SpO2 96%   BMI 26.37 kg/m   Wt Readings from Last 3 Encounters:  12/07/22 145 lb 2 oz (65.8 kg)  11/08/22 145 lb (65.8 kg)  10/31/22 145 lb 3.2 oz (65.9 kg)      Physical Exam Constitutional:      General: She is not in acute distress.    Appearance: Normal appearance. She is well-developed. She is not ill-appearing or toxic-appearing.  HENT:     Head: Normocephalic.     Right Ear: Hearing, tympanic membrane, ear canal and external ear normal. Tympanic membrane is not erythematous, retracted or bulging.     Left Ear: Hearing, tympanic membrane, ear canal and external ear normal. Tympanic membrane is not erythematous, retracted or bulging.     Nose: No mucosal edema or rhinorrhea.     Right Sinus: No maxillary sinus tenderness or frontal sinus tenderness.     Left Sinus: No maxillary sinus tenderness or frontal sinus tenderness.     Mouth/Throat:     Pharynx: Uvula midline.  Eyes:     General: Lids are normal. Lids are everted, no foreign bodies appreciated.      Conjunctiva/sclera: Conjunctivae normal.     Pupils: Pupils are equal, round, and reactive to light.  Neck:     Thyroid: No  thyroid mass or thyromegaly.     Vascular: No carotid bruit.     Trachea: Trachea normal.  Cardiovascular:     Rate and Rhythm: Normal rate and regular rhythm.     Pulses: Normal pulses.     Heart sounds: Normal heart sounds, S1 normal and S2 normal. No murmur heard.    No friction rub. No gallop.  Pulmonary:     Effort: Pulmonary effort is normal. No tachypnea or respiratory distress.     Breath sounds: Normal breath sounds. No decreased breath sounds, wheezing, rhonchi or rales.  Abdominal:     General: Bowel sounds are normal.     Palpations: Abdomen is soft.     Tenderness: There is no abdominal tenderness.  Musculoskeletal:     Cervical back: Normal range of motion and neck supple.  Skin:    General: Skin is warm and dry.     Findings: No rash.  Neurological:     Mental Status: She is alert.  Psychiatric:        Mood and Affect: Mood is not anxious or depressed.        Speech: Speech normal.        Behavior: Behavior normal. Behavior is cooperative.        Thought Content: Thought content normal.        Judgment: Judgment normal.       Results for orders placed or performed in visit on 04/12/22  HM DIABETES EYE EXAM  Result Value Ref Range   HM Diabetic Eye Exam No Retinopathy No Retinopathy    Assessment and Plan  Type 2 diabetes mellitus with other circulatory complication, without long-term current use of insulin (HCC) Assessment & Plan: Due for A1C today; at goal diet controlled.   Hypertension associated with diabetes (HCC) Assessment & Plan: Stable, chronic.  Continue current medication.  Well-controlled on losartan hydrochlorothiazide 100/20 5/2 tablet daily, metoprolol XL 50 mg half tablet daily   Mood disorder (HCC) Assessment & Plan: Chronic, minimal improvement with sertraline 25 mg daily.  We will move forward with  increasing this to 50 mg p.o. daily, if she has increase in fatigue associated with it, I discussed with her husband that he can move this from a.m. dosing to bedtime dosing.  He will try 1 full tablet of alprazolam/ in the morning/1/2 tab at bedtime to see if it helps better with anxiety as opposed to half a tablet 3 times daily. This is not increasing the dose just changing the dose schedule.  He will call with an update on her mood in 4 weeks   Other orders -     Sertraline HCl; Take 1 tablet (50 mg total) by mouth daily.  Dispense: 30 tablet; Refill: 3    Kerby Nora, MD

## 2022-12-07 NOTE — Assessment & Plan Note (Signed)
Chronic, minimal improvement with sertraline 25 mg daily.  We will move forward with increasing this to 50 mg p.o. daily, if she has increase in fatigue associated with it, I discussed with her husband that he can move this from a.m. dosing to bedtime dosing.  He will try 1 full tablet of alprazolam/ in the morning/1/2 tab at bedtime to see if it helps better with anxiety as opposed to half a tablet 3 times daily. This is not increasing the dose just changing the dose schedule.  He will call with an update on her mood in 4 weeks

## 2022-12-07 NOTE — Patient Instructions (Addendum)
Start home PT for upper back stretches. Apply heat, massage.  Can use topical Voltaren gel OTC for pain and inflammation. Please call with an update on patient's mood and 4 weeks.

## 2022-12-07 NOTE — Assessment & Plan Note (Signed)
Stable, chronic.  Continue current medication.  Well-controlled on losartan hydrochlorothiazide 100/20 5/2 tablet daily, metoprolol XL 50 mg half tablet daily

## 2022-12-07 NOTE — Assessment & Plan Note (Signed)
Due for A1C today; at goal diet controlled.

## 2022-12-19 ENCOUNTER — Telehealth: Payer: Self-pay | Admitting: *Deleted

## 2022-12-19 DIAGNOSIS — E1169 Type 2 diabetes mellitus with other specified complication: Secondary | ICD-10-CM

## 2022-12-19 DIAGNOSIS — E538 Deficiency of other specified B group vitamins: Secondary | ICD-10-CM

## 2022-12-19 DIAGNOSIS — E1159 Type 2 diabetes mellitus with other circulatory complications: Secondary | ICD-10-CM

## 2022-12-19 NOTE — Telephone Encounter (Signed)
-----   Message from Alvina Chou sent at 12/19/2022  3:59 PM EDT ----- Regarding: Lab orders for Thursday, 8.22.24 Patient is scheduled for CPX labs, please order future labs, Thanks , Camelia Eng

## 2022-12-19 NOTE — Addendum Note (Signed)
Addended by: Alvina Chou on: 12/19/2022 04:16 PM   Modules accepted: Orders

## 2022-12-25 ENCOUNTER — Ambulatory Visit (INDEPENDENT_AMBULATORY_CARE_PROVIDER_SITE_OTHER): Payer: PPO | Admitting: Dermatology

## 2022-12-25 ENCOUNTER — Encounter: Payer: Self-pay | Admitting: Dermatology

## 2022-12-25 VITALS — BP 143/89 | HR 58

## 2022-12-25 DIAGNOSIS — L820 Inflamed seborrheic keratosis: Secondary | ICD-10-CM

## 2022-12-25 NOTE — Progress Notes (Signed)
   New Patient Visit   Subjective  Janet Chapman is a 87 y.o. female accompanied by her husband  [facility]) who presents for the following: suspected ISK  Patient states she has bumpy growth on scalp suspected to be ISK's located at the scalp that she would like to have examined. Patient reports the areas have been there for  1  year(s). She reports the areas are bothersome. She states that the areas have spread. Patient reports she has previously been treated for these areas. Patient denies Hx of bx. Patient denies family history of skin cancer(s).    The following portions of the chart were reviewed this encounter and updated as appropriate: medications, allergies, medical history  Review of Systems:  No other skin or systemic complaints except as noted in HPI or Assessment and Plan.  Objective  Well appearing patient in no apparent distress; mood and affect are within normal limits.  A focused examination was performed of the following areas: scalp  Relevant exam findings are noted in the Assessment and Plan.            Assessment & Plan   1. Seborrheic Keratoses on Scalp - Assessment: Yellow, tan, waxy papules on the vertex of the scalp, consistent with seborrheic keratoses. Lesions are mildly itchy but not bothersome or painful. - Plan: Performed cryotherapy with liquid nitrogen to freeze the seborrheic keratoses. Advised patient to leave the scalp lesions alone and allow them to crust and fall off within three weeks. Provided written instructions for care of the treated areas.      PROCEDURE NOTE Inflamed seborrheic keratosis (4) Mid Parietal Scalp (3); Right Temple  Symptomatic, irritating, patient would like treated.  Benign-appearing.  Call clinic for new or changing lesions.    Destruction of lesion - Mid Parietal Scalp (3), Right Temple Complexity: simple   Destruction method: cryotherapy   Informed consent: discussed and consent obtained   Timeout:  patient  name, date of birth, surgical site, and procedure verified Lesion destroyed using liquid nitrogen: Yes   Cryotherapy cycles:  5 Post-procedure details: wound care instructions given     Treatment Plan:  - Benign-appearing - Discussed benign etiology and prognosis. - Observe - Call for any changes    No follow-ups on file.  Documentation: I have reviewed the above documentation for accuracy and completeness, and I agree with the above.  Stasia Cavalier, am acting as scribe for Langston Reusing, DO.  Langston Reusing, DO

## 2022-12-25 NOTE — Patient Instructions (Addendum)
Dear Patient,  Thank you for visiting my office today. I appreciate your commitment to addressing your dermatological health concerns. Here is a summary of our discussion and the treatment plan for treating you Seborrheic Keratosis in the scalp and face we have established:  - Diagnosis: You have been diagnosed with seborrheic keratoses on your scalp. These are non-dangerous, waxy, tan, and yellow papules that can be mildly itchy.  - Treatment for Scalp: Continue using the ketoconazole shampoo as previously prescribed. Initially, use it once a week, and after this week, increase to twice a week.  - Treatment for Cheek Spot: We performed a cryotherapy session today to treat the spot on your cheek.    - Post-Treatment Care: Apply Vaseline to the treated area on your temple to aid in healing. This should be done regularly over the next three weeks, or until the spot falls off.  - Follow-Up: Please monitor the treated areas for any changes. If you notice any significant changes or if the spots do not heal as expected, do not hesitate to contact us.  - Educational Material: We have provided you with paperwork that explains how to care for the treated spots at home.  Please ensure to follow the instructions carefully and keep [place] updated on your progress. If you have any questions or concerns, feel free to reach out.  Warm regards,  Dr. Langston Reusing, Dermatologist   Cryotherapy Aftercare  Wash gently with soap and water everyday.   Apply Vaseline and Band-Aid daily until healed.    Due to recent changes in healthcare laws, you may see results of your pathology and/or laboratory studies on MyChart before the doctors have had a chance to review them. We understand that in some cases there may be results that are confusing or concerning to you. Please understand that not all results are received at the same time and often the doctors may need to interpret multiple results in order to provide you  with the best plan of care or course of treatment. Therefore, we ask that you please give [place] 2 business days to thoroughly review all your results before contacting the office for clarification. Should we see a critical lab result, you will be contacted sooner.   If You Need Anything After Your Visit  If you have any questions or concerns for your doctor, please call our main line at [phone] If no one answers, please leave a voicemail as directed and we will return your call as soon as possible. Messages left after 4 pm will be answered the following business day.   You may also send [place] a message via MyChart. We typically respond to MyChart messages within 1-2 business days.  For prescription refills, please ask your pharmacy to contact our office. Our fax number is [phone].  If you have an urgent issue when the clinic is closed that cannot wait until the next business day, you can page your doctor at the number below.    Please note that while we do our best to be available for urgent issues outside of office hours, we are not available 24/7.   If you have an urgent issue and are unable to reach [place], you may choose to seek medical care at your doctor's office, retail clinic, urgent care center, or emergency room.  If you have a medical emergency, please immediately call 911 or go to the emergency department. In the event of inclement weather, please call our main line at [phone] for an  update on the status of any delays or closures.  Dermatology Medication Tips: Please keep the boxes that topical medications come in in order to help keep track of the instructions about where and how to use these. Pharmacies typically print the medication instructions only on the boxes and not directly on the medication tubes.   If your medication is too expensive, please contact our office at [phone] or send [place] a message through MyChart.   We are unable to tell what your co-pay for  medications will be in advance as this is different depending on your insurance coverage. However, we may be able to find a substitute medication at lower cost or fill out paperwork to get insurance to cover a needed medication.   If a prior authorization is required to get your medication covered by your insurance company, please allow [place] 1-2 business days to complete this process.  Drug prices often vary depending on where the prescription is filled and some pharmacies may offer cheaper prices.  The website www.goodrx.com contains coupons for medications through different pharmacies. The prices here do not account for what the cost may be with help from insurance (it may be cheaper with your insurance), but the website can give you the price if you did not use any insurance.  - You can print the associated coupon and take it with your prescription to the pharmacy.  - You may also stop by our office during regular business hours and pick up a GoodRx coupon card.  - If you need your prescription sent electronically to a different pharmacy, notify our office through [facility] or by phone at [phone]

## 2022-12-27 ENCOUNTER — Other Ambulatory Visit: Payer: Self-pay | Admitting: Family Medicine

## 2022-12-27 NOTE — Telephone Encounter (Signed)
Last office visit 12/07/2022 for DM, HTN an Mood.  Last refilled 11/08/2022 for #60 with no refills.  Next Appt: 04/19/2023 for CPE.

## 2023-01-04 ENCOUNTER — Other Ambulatory Visit (INDEPENDENT_AMBULATORY_CARE_PROVIDER_SITE_OTHER): Payer: PPO

## 2023-01-04 ENCOUNTER — Telehealth: Payer: Self-pay | Admitting: Family Medicine

## 2023-01-04 DIAGNOSIS — E538 Deficiency of other specified B group vitamins: Secondary | ICD-10-CM | POA: Diagnosis not present

## 2023-01-04 DIAGNOSIS — E1169 Type 2 diabetes mellitus with other specified complication: Secondary | ICD-10-CM

## 2023-01-04 DIAGNOSIS — E1159 Type 2 diabetes mellitus with other circulatory complications: Secondary | ICD-10-CM | POA: Diagnosis not present

## 2023-01-04 DIAGNOSIS — E785 Hyperlipidemia, unspecified: Secondary | ICD-10-CM

## 2023-01-04 LAB — MICROALBUMIN / CREATININE URINE RATIO
Creatinine,U: 159.3 mg/dL
Microalb Creat Ratio: 0.7 mg/g (ref 0.0–30.0)
Microalb, Ur: 1 mg/dL (ref 0.0–1.9)

## 2023-01-04 LAB — LIPID PANEL
Cholesterol: 179 mg/dL (ref 0–200)
HDL: 70.5 mg/dL (ref >39.00)
LDL Cholesterol: 84 mg/dL (ref 0–99)
NonHDL: 108.34
Total CHOL/HDL Ratio: 3
Triglycerides: 120 mg/dL (ref 0.0–149.0)
VLDL: 24 mg/dL (ref 0.0–40.0)

## 2023-01-04 LAB — COMPREHENSIVE METABOLIC PANEL
ALT: 13 U/L (ref 0–35)
AST: 16 U/L (ref 0–37)
Albumin: 4.4 g/dL (ref 3.5–5.2)
Alkaline Phosphatase: 61 U/L (ref 39–117)
BUN: 14 mg/dL (ref 6–23)
CO2: 39 meq/L — ABNORMAL HIGH (ref 19–32)
Calcium: 10.2 mg/dL (ref 8.4–10.5)
Chloride: 95 mEq/L — ABNORMAL LOW (ref 96–112)
Creatinine, Ser: 0.79 mg/dL (ref 0.40–1.20)
GFR: 68.39 mL/min (ref >60.00)
Glucose, Bld: 104 mg/dL — ABNORMAL HIGH (ref 70–99)
Potassium: 3.4 meq/L — ABNORMAL LOW (ref 3.5–5.1)
Sodium: 141 mEq/L (ref 135–145)
Total Bilirubin: 0.7 mg/dL (ref 0.2–1.2)
Total Protein: 7 g/dL (ref 6.0–8.3)

## 2023-01-04 NOTE — Telephone Encounter (Signed)
Patient here for labs today, husband wanted to let you know she is doing well on her new medication. He said, "you wanted an update".

## 2023-01-05 LAB — VITAMIN B12: Vitamin B-12: >1501 pg/mL — ABNORMAL HIGH (ref 211–911)

## 2023-01-09 ENCOUNTER — Ambulatory Visit (INDEPENDENT_AMBULATORY_CARE_PROVIDER_SITE_OTHER): Payer: PPO

## 2023-01-09 VITALS — Wt 145.0 lb

## 2023-01-09 DIAGNOSIS — Z0000 Encounter for general adult medical examination without abnormal findings: Secondary | ICD-10-CM | POA: Diagnosis not present

## 2023-01-09 NOTE — Progress Notes (Signed)
Subjective:   Janet Chapman is a 87 y.o. female who presents for Medicare Annual (Subsequent) preventive examination.  Visit Complete: Virtual  I connected with  Henriette Combs on 01/09/23 by a audio enabled telemedicine application and verified that I am speaking with the correct person using two identifiers.  Patient Location: Home  Provider Location: Office/Clinic  I discussed the limitations of evaluation and management by telemedicine. The patient expressed understanding and agreed to proceed.  Vital Signs: Unable to obtain new vitals due to this being a telehealth visit.   Review of Systems     Cardiac Risk Factors include: advanced age (>22men, >7 women);dyslipidemia;hypertension     Objective:    Today's Vitals   01/09/23 0948  Weight: 145 lb (65.8 kg)   Body mass index is 26.35 kg/m.     01/09/2023   12:05 PM 03/23/2022   11:29 AM 09/27/2021   11:18 AM 03/18/2021    9:05 AM 10/18/2020   11:27 AM 12/04/2017    2:29 PM 11/06/2017   12:25 PM  Advanced Directives  Does Patient Have a Medical Advance Directive? Yes No No No No Yes Yes  Type of Estate agent of [place];Living will     Healthcare Power of [place];Living will Healthcare Power of [place];Living will  Does patient want to make changes to medical advance directive?       No - Patient declined  Copy of Healthcare Power of Attorney in Chart? No - copy requested     No - copy requested No - copy requested  Would patient like information on creating a medical advance directive?  No - Patient declined No - Patient declined Yes (MAU/Ambulatory/Procedural Areas - Information given) Yes (MAU/Ambulatory/Procedural Areas - Information given)      Current Medications (verified) Outpatient Encounter Medications as of 01/09/2023  Medication Sig   ALPRAZolam (XANAX) 0.25 MG tablet TAKE 1/2 TABLET BY MOUTH EVERY 6 HOURS AS NEEDED FOR ANXIETY OR JITTERINESS   Apoaequorin (PREVAGEN PO) Take 1  capsule by mouth every morning.   aspirin EC 81 MG tablet Take 81 mg by mouth daily with supper. Swallow whole.   cyanocobalamin (VITAMIN B12) 1000 MCG tablet Take 1,000 mcg by mouth daily.   fluticasone (FLONASE) 50 MCG/ACT nasal spray Place 2 sprays into both nostrils daily.   ketoconazole (NIZORAL) 2 % shampoo Apply 1 application. topically every 14 (fourteen) days.   losartan-hydrochlorothiazide (HYZAAR) 100-25 MG tablet TAKE 1/2 TABLET BY MOUTH DAILY   memantine (NAMENDA XR) 14 MG CP24 24 hr capsule Take 1 capsule (14 mg total) by mouth daily.   montelukast (SINGULAIR) 10 MG tablet TAKE 1 TABLET BY MOUTH AT BEDTIME   sertraline (ZOLOFT) 50 MG tablet Take 1 tablet (50 mg total) by mouth daily.   simvastatin (ZOCOR) 40 MG tablet TAKE 1 TABLET BY MOUTH EACH NIGHT AT BEDTIME   No facility-administered encounter medications on file as of 01/09/2023.    Allergies (verified) Codeine   History: Past Medical History:  Diagnosis Date   Allergic rhinitis    Diabetes mellitus without complication (HCC)    diet controlled   GERD (gastroesophageal reflux disease)    Hyperlipidemia    Hypertension    Memory loss    Osteoarthritis    Urinary incontinence    Past Surgical History:  Procedure Laterality Date   ABDOMINAL HYSTERECTOMY  1993   TA   CARDIOVASCULAR STRESS TEST  2004   H. Katrinka Blazing, cardiolite-normal   CHOLECYSTECTOMY  [phone]  COLONOSCOPY     LEFT HEART CATH AND CORONARY ANGIOGRAPHY N/A 11/06/2017   Procedure: LEFT HEART CATH AND CORONARY ANGIOGRAPHY;  Surgeon: Kathleene Hazel, MD;  Location: [facility] INVASIVE [facility];  Service: Cardiovascular;  Laterality: N/A;   Family History  Problem Relation Age of Onset   Heart attack Father 55   Dementia Mother    Stroke Mother 82   Ovarian cancer Sister 79   Diabetes Other        Maternal side   Ovarian cancer Other        Grandmother   Ovarian cancer Other        aunt   Colon cancer Neg Hx    Social History   Socioeconomic  History   Marital status: Married    Spouse name: Not on file   Number of children: 0   Years of education: Not on file   Highest education level: Not on file  Occupational History   Occupation: retired  Tobacco Use   Smoking status: Never   Smokeless tobacco: Never   Tobacco comments:    In college  Vaping Use   Vaping status: Never Used  Substance and Sexual Activity   Alcohol use: Not Currently   Drug use: No   Sexual activity: Never  Other Topics Concern   Not on file  Social History Narrative    Regular exercise: yes, Curves 2x a week   Married 48 + years    Diet: fruit and veggies, no FF, some water, artificial sweetener   HCPOA: Remus Loffler, has living will, full code ( reviewed 2014)            Social Determinants of Health   Financial Resource Strain: Low Risk  (01/09/2023)   Overall Financial Resource Strain (CARDIA)    Difficulty of Paying Living Expenses: Not hard at all  Food Insecurity: No Food Insecurity (01/09/2023)   Hunger Vital Sign    Worried About Running Out of Food in the Last Year: Never true    Ran Out of Food in the Last Year: Never true  Transportation Needs: No Transportation Needs (01/09/2023)   PRAPARE - Administrator, Civil Service (Medical): No    Lack of Transportation (Non-Medical): No  Physical Activity: Insufficiently Active (01/09/2023)   Exercise Vital Sign    Days of Exercise per Week: 3 days    Minutes of Exercise per Session: 30 min  Stress: No Stress Concern Present (01/09/2023)   [employer] of Occupational Health - Occupational Stress Questionnaire    Feeling of Stress : Not at all  Social Connections: Moderately Isolated (01/09/2023)   Social Connection and Isolation Panel [NHANES]    Frequency of Communication with Friends and Family: Once a week    Frequency of Social Gatherings with Friends and Family: Once a week    Attends Religious Services: More than 4 times per year    Active Member of [employer] or  Organizations: No    Attends Banker Meetings: Never    Marital Status: Married    Tobacco Counseling Counseling given: Not Answered Tobacco comments: In college   Clinical Intake:     Pain : No/denies pain     BMI - recorded: 26.35 Nutritional Status: BMI 25 -29 Overweight Nutritional Risks: None Diabetes: Yes CBG done?: No Did pt. bring in CBG monitor from home?: No  How often do you need to have someone help you when you read instructions, pamphlets, or other  written materials from your doctor or pharmacy?: 1 - Never  Interpreter Needed?: No  Information entered by :: Lanier Ensign, LPN   Activities of Daily Living    01/09/2023   12:01 PM 03/23/2022   11:30 AM  In your present state of health, do you have any difficulty performing the following activities:  Hearing? 0 0  Vision? 0 0  Difficulty concentrating or making decisions? 0 0  Walking or climbing stairs? 0 0  Dressing or bathing? 0 0  Doing errands, shopping? 0 1  Preparing Food and eating ? N N  Using the Toilet? N N  In the past six months, have you accidently leaked urine? N N  Do you have problems with loss of bowel control? N N  Managing your Medications? N N  Managing your Finances? N N  Housekeeping or managing your Housekeeping? N N    Patient Care Team: Excell Seltzer, MD as PCP - General Katrinka Blazing Barry Dienes, MD (Inactive) as PCP - Cardiology (Cardiology) Mat Carne, DO (Optometry) Vilinda Flake, [facility] (Inactive) as Pharmacist (Pharmacist)  Indicate any recent Medical Services you may have received from other than [facility] providers in the past year (date may be approximate).     Assessment:   This is a routine wellness examination for [place].  Hearing/Vision screen Hearing Screening - Comments:: Pt denies any hearing issues  Vision Screening - Comments:: Pt stated she follows up with [facility]   Dietary issues and exercise activities discussed:     Goals Addressed              This Visit's Progress    Patient Stated       Stay healthy        Depression Screen    01/09/2023   12:03 PM 12/07/2022   11:18 AM 11/08/2022   11:08 AM 10/31/2022   11:16 AM 07/05/2022   11:00 AM 03/23/2022   11:28 AM 09/22/2021   11:28 AM  PHQ 2/9 Scores  PHQ - 2 Score 0 0 1 1 0 0 0  PHQ- 9 Score 0 2 3 4 1  0     Fall Risk    01/09/2023   12:05 PM 11/08/2022   11:08 AM 07/05/2022   11:00 AM 03/23/2022   11:29 AM 09/22/2021   11:28 AM  Fall Risk   Falls in the past year? 0 0 0 0 0  Number falls in past yr: 0 0 0 0   Injury with Fall? 0 0 0 0   Risk for fall due to : Impaired vision No Fall Risks No Fall Risks No Fall Risks   Follow up Falls prevention discussed Falls evaluation completed Falls evaluation completed Falls prevention discussed;Falls evaluation completed Falls evaluation completed    MEDICARE RISK AT HOME: Medicare Risk at Home Any stairs in or around the home?: Yes If so, are there any without handrails?: Yes Home free of loose throw rugs in walkways, pet beds, electrical cords, etc?: No Adequate lighting in your home to reduce risk of falls?: Yes Life alert?: No Use of a cane, walker or w/c?: No Grab bars in the bathroom?: No Shower chair or bench in shower?: No Elevated toilet seat or a handicapped toilet?: No  TIMED UP AND GO:  Was the test performed?  No    Cognitive Function:    07/11/2022    1:04 PM 01/04/2022    1:17 PM 06/30/2021   10:33 AM 12/15/2020   11:18  AM 06/24/2020   10:07 AM  MMSE - Mini Mental State Exam  Orientation to time 3 3 3 5 4   Orientation to Place 5 4 3 5 5   Registration 3 3 3 3 3   Attention/ Calculation 4 4 1 5 1   Attention/Calculation-comments    spelled world   Recall 0 0 0 0 1  Language- name 2 objects 2 2 2 2 2   Language- repeat 1 1 1 1 1   Language- follow 3 step command 3 3 3 3 3   Language- read & follow direction 1 1 1 1 1   Write a sentence 1 1 1 1 1   Copy design 1 1 1  0 1  Total score 24 23 19 26  23         01/09/2023   12:06 PM 03/23/2022   11:30 AM  6CIT Screen  What Year? 0 points 0 points  What month? 0 points 0 points  What time? 0 points 0 points  Count back from 20 0 points 0 points  Months in reverse 2 points 2 points  Repeat phrase 0 points 4 points  Total Score 2 points 6 points    Immunizations Immunization History  Administered Date(s) Administered   COVID-19, mRNA, vaccine(Comirnaty)12 years and older 04/18/2022   Fluad Quad(high Dose 65+) 03/11/2020, 03/24/2021, 02/28/2022   Influenza Split 03/16/2011, 02/21/2012, 02/06/2013   Influenza Whole 02/13/2007, 03/12/2008, 02/09/2009, 02/12/2009   Influenza, High Dose Seasonal PF 02/19/2017   Influenza, Seasonal, Injecte, Preservative Fre 02/17/2014   Influenza,inj,Quad PF,6+ Mos 01/07/2015, 02/03/2016, 02/21/2018   Influenza,inj,quad, With Preservative 01/24/2019   PFIZER(Purple Top)SARS-COV-2 Vaccination 06/05/2019, 06/26/2019, 04/13/2020, 11/25/2020   Pfizer Covid-19 Vaccine Bivalent Booster 17yrs & up 04/21/2021   Pneumococcal Conjugate-13 06/26/2013   Pneumococcal Polysaccharide-23 05/16/2003, 05/20/2010   Td 04/19/2007, 03/24/2021   Zoster Recombinant(Shingrix) 04/14/2021, 05/03/2022   Zoster, Live 05/20/2010    TDAP status: Up to date  Flu Vaccine status: Due, Education has been provided regarding the importance of this vaccine. Advised may receive this vaccine at local pharmacy or Health Dept. Aware to provide a copy of the vaccination record if obtained from local pharmacy or Health Dept. Verbalized acceptance and understanding.  Pneumococcal vaccine status: Up to date  Covid-19 vaccine status: Information provided on how to obtain vaccines.   Qualifies for Shingles Vaccine? Yes   Zostavax completed Yes   Shingrix Completed?: Yes  Screening Tests Health Maintenance  Topic Date Due   FOOT EXAM  03/21/2022   COVID-19 Vaccine (7 - 2023-24 season) 06/13/2022   OPHTHALMOLOGY EXAM  06/17/2022    INFLUENZA VACCINE  12/14/2022   MAMMOGRAM  05/18/2023   HEMOGLOBIN A1C  06/09/2023   Diabetic kidney evaluation - eGFR measurement  01/04/2024   Diabetic kidney evaluation - Urine ACR  01/04/2024   Medicare Annual Wellness (AWV)  01/09/2024   DEXA SCAN  05/17/2026   DTaP/Tdap/Td (3 - Tdap) 03/25/2031   Pneumonia Vaccine 78+ Years old  Completed   Zoster Vaccines- Shingrix  Completed   HPV VACCINES  Aged Out    Health Maintenance  Health Maintenance Due  Topic Date Due   FOOT EXAM  03/21/2022   COVID-19 Vaccine (7 - 2023-24 season) 06/13/2022   OPHTHALMOLOGY EXAM  06/17/2022   INFLUENZA VACCINE  12/14/2022    Colorectal cancer screening: No longer required.   Mammogram status: No longer required due to age.  Bone Density status: Completed 05/17/21. Results reflect: Bone density results: OSTEOPENIA. Repeat every 5 years.   Additional Screening:  Vision Screening: Recommended annual ophthalmology exams for early detection of glaucoma and other disorders of the eye. Is the patient up to date with their annual eye exam?  No  Who is the provider or what is the name of the office in which the patient attends annual eye exams? [facility]  If pt is not established with a provider, would they like to be referred to a provider to establish care? No .   Dental Screening: Recommended annual dental exams for proper oral hygiene  Diabetic Foot Exam: Diabetic Foot Exam: Overdue, Pt has been advised about the importance in completing this exam. Pt is scheduled for diabetic foot exam on next app.  Community Resource Referral / Chronic Care Management: CRR required this visit?  No   CCM required this visit?  No     Plan:     I have personally reviewed and noted the following in the patient's chart:   Medical and social history Use of alcohol, tobacco or illicit drugs  Current medications and supplements including opioid prescriptions. Patient is not currently taking opioid  prescriptions. Functional ability and status Nutritional status Physical activity Advanced directives List of other physicians Hospitalizations, surgeries, and ER visits in previous 12 months Vitals Screenings to include cognitive, depression, and falls Referrals and appointments  In addition, I have reviewed and discussed with patient certain preventive protocols, quality metrics, and best practice recommendations. A written personalized care plan for preventive services as well as general preventive health recommendations were provided to patient.     Marzella Schlein, LPN   [id number]   After Visit Summary: (Declined) Due to this being a telephonic visit, with patients personalized plan was offered to patient but patient Declined AVS at this time   Nurse Notes: none

## 2023-01-09 NOTE — Patient Instructions (Signed)
Janet Chapman , Thank you for taking time to come for your Medicare Wellness Visit. I appreciate your ongoing commitment to your health goals. Please review the following plan we discussed and let me know if I can assist you in the future.   Referrals/Orders/Follow-Ups/Clinician Recommendations: stay healthy   This is a list of the screening recommended for you and due dates:  Health Maintenance  Topic Date Due   Complete foot exam   03/21/2022   COVID-19 Vaccine (7 - 2023-24 season) 06/13/2022   Eye exam for diabetics  06/17/2022   Flu Shot  12/14/2022   Mammogram  05/18/2023   Hemoglobin A1C  06/09/2023   Yearly kidney function blood test for diabetes  01/04/2024   Yearly kidney health urinalysis for diabetes  01/04/2024   Medicare Annual Wellness Visit  01/09/2024   DEXA scan (bone density measurement)  05/17/2026   DTaP/Tdap/Td vaccine (3 - Tdap) 03/25/2031   Pneumonia Vaccine  Completed   Zoster (Shingles) Vaccine  Completed   HPV Vaccine  Aged Out    Advanced directives: (Copy Requested) Please bring a copy of your health care power of attorney and living will to the office to be added to your chart at your convenience.  Next Medicare Annual Wellness Visit scheduled for next year: Yes

## 2023-01-16 ENCOUNTER — Other Ambulatory Visit: Payer: Self-pay | Admitting: Family Medicine

## 2023-02-01 NOTE — Progress Notes (Deleted)
PATIENT: Janet Chapman DOB: 12-18-1935  REASON FOR VISIT: Follow up for memory HISTORY FROM: Patient, husband PRIMARY NEUROLOGIST: Dr. Anne Hahn now will be followed by Dr. Frances Furbish  HISTORY OF PRESENT ILLNESS: Today 02/01/23   07/11/22 SS: MMSE 24/30. PCP put her on Xanax 1/2 pill due to her anxiety, shakiness. We tried Namenda XR 21 mg daily, reports it made her dizzy. Memory has declined, when rushing hard time finding what she is looking for at the time. Is not driving. She does the cooking, housework. Had bladder infection, trying to drink more water. She takes Educational psychologist. Does to bible study 1-2 times a week.   Update 01/04/22 SS: [name] is here today for follow-up.  Is taking Namenda XR 14 mg daily, no side effects.  Tolerating well. MMSE 23/30 today. Still short term memory trouble. Does her own ADLs.  She cooks and cleans. She likes to eat. No falls. Had UTI 2 months ago. Takes Miralax every 3rd day. Sleeps fair, sometimes can't fall asleep. Weight is same. Enjoys cleaning. Watches TV. Drives short distances, denies issues. Mood is good. Does repeat herself today.   Update 06/30/21 SS: [name] here today for follow-up with history of memory trouble.  Has been unable to tolerate memory medications well, is able to tolerate namenda 5 mg twice daily, but has to take with supper and then midnight. She thinks main issue is repeating herself. Does her own ADLs, drives, does housework, goes to church. For a few months they stopped namenda because of dizziness, her memory "nose dived". Good improvement when she went back on it. MMSE 19/30 today was 26/30 in August 2022. Here today with her husband.  HISTORY  12/15/2020 MM: Ms. First is an 87 year old female with a history of memory disturbance.  She returns today for follow-up.  The patient has tried multiple medications in the past but has not been able to tolerate them.  These medications include Exelon, Aricept and Namenda.  She feels that her  memory has remained stable.  She lives at home with her husband.  She is able to complete all ADLs independently.  She continues to complete all household chores.  She is able to drive to familiar places.  Denies any trouble sleeping.  She manages her own medications and appointments.  She also manages the finances without difficulty.  She returns today for an evaluation.  REVIEW OF SYSTEMS: Out of a complete 14 system review of symptoms, the patient complains only of the following symptoms, and all other reviewed systems are negative.  See HPI  ALLERGIES: Allergies  Allergen Reactions   Codeine Nausea Only    HOME MEDICATIONS: Outpatient Medications Prior to Visit  Medication Sig Dispense Refill   ALPRAZolam (XANAX) 0.25 MG tablet TAKE 1/2 TABLET BY MOUTH EVERY 6 HOURS AS NEEDED FOR ANXIETY OR JITTERINESS 60 tablet 0   Apoaequorin (PREVAGEN PO) Take 1 capsule by mouth every morning.     aspirin EC 81 MG tablet Take 81 mg by mouth daily with supper. Swallow whole.     cyanocobalamin (VITAMIN B12) 1000 MCG tablet Take 1,000 mcg by mouth daily.     fluticasone (FLONASE) 50 MCG/ACT nasal spray Place 2 sprays into both nostrils daily. 16 g 2   ketoconazole (NIZORAL) 2 % shampoo Apply 1 application. topically every 14 (fourteen) days.     losartan-hydrochlorothiazide (HYZAAR) 100-25 MG tablet TAKE 1/2 TABLET BY MOUTH DAILY 45 tablet 1   memantine (NAMENDA XR) 14 MG CP24 24  hr capsule Take 1 capsule (14 mg total) by mouth daily. 30 capsule 11   montelukast (SINGULAIR) 10 MG tablet TAKE 1 TABLET BY MOUTH AT BEDTIME 30 tablet 2   sertraline (ZOLOFT) 50 MG tablet Take 1 tablet (50 mg total) by mouth daily. 30 tablet 3   simvastatin (ZOCOR) 40 MG tablet TAKE 1 TABLET BY MOUTH EACH NIGHT AT BEDTIME 90 tablet 3   No facility-administered medications prior to visit.    PAST MEDICAL HISTORY: Past Medical History:  Diagnosis Date   Allergic rhinitis    Diabetes mellitus without complication (HCC)     diet controlled   GERD (gastroesophageal reflux disease)    Hyperlipidemia    Hypertension    Memory loss    Osteoarthritis    Urinary incontinence     PAST SURGICAL HISTORY: Past Surgical History:  Procedure Laterality Date   ABDOMINAL HYSTERECTOMY  1993   TA   CARDIOVASCULAR STRESS TEST  2004   H. Katrinka Blazing, cardiolite-normal   CHOLECYSTECTOMY  7425'Z   COLONOSCOPY     LEFT HEART CATH AND CORONARY ANGIOGRAPHY N/A 11/06/2017   Procedure: LEFT HEART CATH AND CORONARY ANGIOGRAPHY;  Surgeon: Kathleene Hazel, MD;  Location: [facility];  Service: Cardiovascular;  Laterality: N/A;    FAMILY HISTORY: Family History  Problem Relation Age of Onset   Heart attack Father 11   Dementia Mother    Stroke Mother 72   Ovarian cancer Sister 90   Diabetes Other        Maternal side   Ovarian cancer Other        Grandmother   Ovarian cancer Other        aunt   Colon cancer Neg Hx     SOCIAL HISTORY: Social History   Socioeconomic History   Marital status: Married    Spouse name: Not on file   Number of children: 0   Years of education: Not on file   Highest education level: Not on file  Occupational History   Occupation: retired  Tobacco Use   Smoking status: Never   Smokeless tobacco: Never   Tobacco comments:    In college  Vaping Use   Vaping status: Never Used  Substance and Sexual Activity   Alcohol use: Not Currently   Drug use: No   Sexual activity: Never  Other Topics Concern   Not on file  Social History Narrative    Regular exercise: yes, Curves 2x a week   Married 48 + years    Diet: fruit and veggies, no FF, some water, artificial sweetener   HCPOA: Remus Loffler, has living will, full code ( reviewed 2014)            Social Determinants of Health   Financial Resource Strain: Low Risk  (01/09/2023)   Overall Financial Resource Strain (CARDIA)    Difficulty of Paying Living Expenses: Not hard at all  Food Insecurity: No Food Insecurity  (01/09/2023)   Hunger Vital Sign    Worried About Running Out of Food in the Last Year: Never true    Ran Out of Food in the Last Year: Never true  Transportation Needs: No Transportation Needs (01/09/2023)   PRAPARE - Administrator, Civil Service (Medical): No    Lack of Transportation (Non-Medical): No  Physical Activity: Insufficiently Active (01/09/2023)   Exercise Vital Sign    Days of Exercise per Week: 3 days    Minutes of Exercise per Session:  30 min  Stress: No Stress Concern Present (01/09/2023)   [employer] of Occupational Health - Occupational Stress Questionnaire    Feeling of Stress : Not at all  Social Connections: Moderately Isolated (01/09/2023)   Social Connection and Isolation Panel [NHANES]    Frequency of Communication with Friends and Family: Once a week    Frequency of Social Gatherings with Friends and Family: Once a week    Attends Religious Services: More than 4 times per year    Active Member of [employer] or Organizations: No    Attends Banker Meetings: Never    Marital Status: Married  Catering manager Violence: Not At Risk (01/09/2023)   Humiliation, Afraid, Rape, and Kick questionnaire    Fear of Current or Ex-Partner: No    Emotionally Abused: No    Physically Abused: No    Sexually Abused: No   PHYSICAL EXAM  There were no vitals filed for this visit.  There is no height or weight on file to calculate BMI.  Generalized: Well developed, in no acute distress, well dressed, put together     07/11/2022    1:04 PM 01/04/2022    1:17 PM 06/30/2021   10:33 AM  MMSE - Mini Mental State Exam  Orientation to time 3 3 3   Orientation to Place 5 4 3   Registration 3 3 3   Attention/ Calculation 4 4 1   Recall 0 0 0  Language- name 2 objects 2 2 2   Language- repeat 1 1 1   Language- follow 3 step command 3 3 3   Language- read & follow direction 1 1 1   Write a sentence 1 1 1   Copy design 1 1 1   Total score 24 23 19     Neurological examination  Mentation: Alert oriented to time, place, history taking. Follows all commands speech and language fluent.  Does look to her husband for reassurance about questioning.  Cranial nerve II-XII: Pupils were equal round reactive to light. Extraocular movements were full, visual field were full on confrontational test. Facial sensation and strength were normal. Head turning and shoulder shrug  were normal and symmetric. Motor: The motor testing reveals 5 over 5 strength of all 4 extremities. Good symmetric motor tone is noted throughout.  Sensory: Sensory testing is intact to soft touch on all 4 extremities. No evidence of extinction is noted.  Coordination: Cerebellar testing reveals good finger-nose-finger and heel-to-shin bilaterally.  Gait and station: Gait is normal. Reflexes: Deep tendon reflexes are symmetric but decreased  DIAGNOSTIC DATA (LABS, IMAGING, TESTING) - I reviewed patient records, labs, notes, testing and imaging myself where available.  Lab Results  Component Value Date   WBC 5.9 02/28/2022   HGB 14.2 02/28/2022   HCT 42.7 02/28/2022   MCV 96.1 02/28/2022   PLT 214.0 02/28/2022      Component Value Date/Time   NA 141 01/04/2023 1125   NA 138 09/04/2018 1537   K 3.4 (L) 01/04/2023 1125   CL 95 (L) 01/04/2023 1125   CO2 39 (H) 01/04/2023 1125   GLUCOSE 104 (H) 01/04/2023 1125   BUN 14 01/04/2023 1125   BUN 20 09/04/2018 1537   CREATININE 0.79 01/04/2023 1125   CALCIUM 10.2 01/04/2023 1125   PROT 7.0 01/04/2023 1125   ALBUMIN 4.4 01/04/2023 1125   AST 16 01/04/2023 1125   ALT 13 01/04/2023 1125   ALKPHOS 61 01/04/2023 1125   BILITOT 0.7 01/04/2023 1125   GFRNONAA >60 09/27/2021 1135   GFRAA 73 09/04/2018  1537   Lab Results  Component Value Date   CHOL 179 01/04/2023   HDL 70.50 01/04/2023   LDLCALC 84 01/04/2023   LDLDIRECT 119.8 04/26/2009   TRIG 120.0 01/04/2023   CHOLHDL 3 01/04/2023   Lab Results  Component Value Date    HGBA1C 5.9 (A) 12/07/2022   Lab Results  Component Value Date   VITAMINB12 >1501 (H) 01/04/2023   Lab Results  Component Value Date   TSH 1.99 02/28/2022   ASSESSMENT AND PLAN 87 y.o. year old female   1.  Dementia   -No major changes. MMSE 24/30 -Continue Namenda XR 14 mg daily, 21 mg made her dizzy  -Has not been able to tolerate Exelon or Aricept -Encouraged to implement brain stimulating exercises, consistent physical activity -She would like to follow-up in 6 months  Otila Kluver, DNP  [facility] [address] [place], [place] [address] [phone]

## 2023-02-05 ENCOUNTER — Ambulatory Visit: Payer: PPO | Admitting: Neurology

## 2023-02-05 ENCOUNTER — Encounter: Payer: Self-pay | Admitting: Neurology

## 2023-02-08 ENCOUNTER — Ambulatory Visit: Payer: PPO | Admitting: Neurology

## 2023-03-06 ENCOUNTER — Other Ambulatory Visit: Payer: Self-pay | Admitting: Family Medicine

## 2023-03-06 NOTE — Telephone Encounter (Signed)
Last office visit 12/07/2022 for DM, HTN and Mood.   Last refilled 12/27/2022 for #60 with no refills.  Next Appt: CPE 04/19/2023.

## 2023-03-27 ENCOUNTER — Ambulatory Visit: Payer: PPO | Admitting: Dermatology

## 2023-04-10 DIAGNOSIS — F0390 Unspecified dementia without behavioral disturbance: Secondary | ICD-10-CM | POA: Diagnosis not present

## 2023-04-10 DIAGNOSIS — M6281 Muscle weakness (generalized): Secondary | ICD-10-CM | POA: Diagnosis not present

## 2023-04-10 DIAGNOSIS — R278 Other lack of coordination: Secondary | ICD-10-CM | POA: Diagnosis not present

## 2023-04-19 ENCOUNTER — Encounter: Payer: Self-pay | Admitting: Family Medicine

## 2023-04-19 ENCOUNTER — Ambulatory Visit: Payer: PPO | Admitting: Family Medicine

## 2023-04-19 VITALS — BP 130/70 | HR 68 | Temp 98.1°F | Ht 62.0 in | Wt 149.5 lb

## 2023-04-19 DIAGNOSIS — E1159 Type 2 diabetes mellitus with other circulatory complications: Secondary | ICD-10-CM | POA: Diagnosis not present

## 2023-04-19 DIAGNOSIS — Z0000 Encounter for general adult medical examination without abnormal findings: Secondary | ICD-10-CM

## 2023-04-19 DIAGNOSIS — G301 Alzheimer's disease with late onset: Secondary | ICD-10-CM | POA: Diagnosis not present

## 2023-04-19 DIAGNOSIS — F411 Generalized anxiety disorder: Secondary | ICD-10-CM | POA: Diagnosis not present

## 2023-04-19 DIAGNOSIS — Z23 Encounter for immunization: Secondary | ICD-10-CM

## 2023-04-19 DIAGNOSIS — I152 Hypertension secondary to endocrine disorders: Secondary | ICD-10-CM

## 2023-04-19 DIAGNOSIS — E1169 Type 2 diabetes mellitus with other specified complication: Secondary | ICD-10-CM

## 2023-04-19 DIAGNOSIS — E785 Hyperlipidemia, unspecified: Secondary | ICD-10-CM | POA: Diagnosis not present

## 2023-04-19 DIAGNOSIS — F02B0 Dementia in other diseases classified elsewhere, moderate, without behavioral disturbance, psychotic disturbance, mood disturbance, and anxiety: Secondary | ICD-10-CM

## 2023-04-19 DIAGNOSIS — I25110 Atherosclerotic heart disease of native coronary artery with unstable angina pectoris: Secondary | ICD-10-CM | POA: Diagnosis not present

## 2023-04-19 LAB — POCT GLYCOSYLATED HEMOGLOBIN (HGB A1C): Hemoglobin A1C: 6.1 % — AB (ref 4.0–5.6)

## 2023-04-19 LAB — HM DIABETES FOOT EXAM

## 2023-04-19 MED ORDER — ALPRAZOLAM 0.25 MG PO TABS: ORAL_TABLET | ORAL | Status: DC

## 2023-04-19 NOTE — Progress Notes (Signed)
Patient ID: Janet Chapman, female    DOB: [birth year], 87 y.o.   MRN: [id number]  This visit was conducted in person.  BP 130/70 (BP Location: Left Arm, Patient Position: Sitting, Cuff Size: Normal)   Pulse 68   Temp 98.1 F (36.7 C) (Temporal)   Ht 5\' 2"  (1.575 m)   Wt 149 lb 8 oz (67.8 kg)   SpO2 97%   BMI 27.34 kg/m    CC:  Chief Complaint  Patient presents with   Annual Exam    Part 2    Subjective:   HPI: Janet Chapman is a 87 y.o. female presenting on 04/19/2023 for Annual Exam (Part 2) The patient presents for complete physical and review of chronic health problems. He/She also has the following acute concerns today: Major life changes   Sine 01/2023 husband is  ailing.. now on hospice.  He was primary caregiver.. now care taken over by nephew... Encouraged him to set up DPR, look into healthcare power of attorney and add name to contact list  Nephew is at appt with her today... has pain in left anterior knee when walking... come and goes. She had complained about it in last 3 months.  She is living at  [facility] 2 weeks.  She is seeing happier lately, had been getting worse since husband has been ill.  Less agitated.   The patient saw a LPN or RN for medicare wellness visit.  January 09, 2023  Prevention and wellness was reviewed in detail. Note reviewed and important notes copied below.  Hypertension:  Well-controlled on losartan hydrochlorothiazide 100/20 5/2 tablet daily, metoprolol XL 50 mg half tablet daily BP Readings from Last 3 Encounters:  04/19/23 130/70  12/25/22 (!) 143/89  12/07/22 110/62  Using medication without problems or lightheadedness:  occ Chest pain with exertion: none Edema:none Short of breath: none Average home BPs: Other issues:  Wt Readings from Last 3 Encounters:  04/19/23 149 lb 8 oz (67.8 kg)  01/09/23 145 lb (65.8 kg)  12/07/22 145 lb 2 oz (65.8 kg)     Diabetes: Good control with diet at last  check. Lab Results  Component Value Date   HGBA1C 6.1 (A) 04/19/2023  Using medications without difficulties: Hypoglycemic episodes: Hyperglycemic episodes: Feet problems: no ulcer Blood Sugars averaging: eye exam within last year: yes  History of coronary artery disease with no current angina  Dementia:  Chronic . stable On memantine XR 14 mg p.o. daily (dizziness did not improve with decreasing from the 22 )    Elevated Cholesterol:   Almost at goal on simvastatin.  LDL goal < 70 given CAD. Lab Results  Component Value Date   CHOL 179 01/04/2023   HDL 70.50 01/04/2023   LDLCALC 84 01/04/2023   LDLDIRECT 119.8 04/26/2009   TRIG 120.0 01/04/2023   CHOLHDL 3 01/04/2023  Using medications without problems: Muscle aches:  Diet compliance: good diet, not snbackin Exercise: walking a lot Other complaints:   GAD :  Chronic stable control on sertraline 50 mg daily  Flowsheet Row Clinical Support from 01/09/2023 in [facility] [place]  PHQ-2 Total Score 0        Relevant past medical, surgical, family and social history reviewed and updated as indicated. Interim medical history since our last visit reviewed. Allergies and medications reviewed and updated. Outpatient Medications Prior to Visit  Medication Sig Dispense Refill   aspirin EC 81 MG tablet Take  81 mg by mouth daily with supper. Swallow whole.     cyanocobalamin (VITAMIN B12) 1000 MCG tablet Take 1,000 mcg by mouth daily.     fluticasone (FLONASE) 50 MCG/ACT nasal spray Place 2 sprays into both nostrils daily. 16 g 2   ketoconazole (NIZORAL) 2 % shampoo Apply 1 application. topically every 14 (fourteen) days.     losartan-hydrochlorothiazide (HYZAAR) 100-25 MG tablet TAKE 1/2 TABLET BY MOUTH DAILY 45 tablet 1   memantine (NAMENDA XR) 14 MG CP24 24 hr capsule Take 1 capsule (14 mg total) by mouth daily. 30 capsule 11   montelukast (SINGULAIR) 10 MG tablet TAKE 1 TABLET BY MOUTH AT BEDTIME 30  tablet 2   sertraline (ZOLOFT) 50 MG tablet Take 1 tablet (50 mg total) by mouth daily. 30 tablet 3   simvastatin (ZOCOR) 40 MG tablet TAKE 1 TABLET BY MOUTH EACH NIGHT AT BEDTIME 90 tablet 3   ALPRAZolam (XANAX) 0.25 MG tablet TAKE 1/2 TABLET BY MOUTH EVERY 6 HOURS AS NEEDED FOR ANXIETY OR JITTERINESS 60 tablet 0   Apoaequorin (PREVAGEN PO) Take 1 capsule by mouth every morning.     No facility-administered medications prior to visit.     Per HPI unless specifically indicated in ROS section below Review of Systems  Constitutional:  Negative for fatigue and fever.  HENT:  Negative for congestion.   Eyes:  Negative for pain.  Respiratory:  Negative for cough and shortness of breath.   Cardiovascular:  Negative for chest pain, palpitations and leg swelling.  Gastrointestinal:  Negative for abdominal pain.  Genitourinary:  Negative for dysuria and vaginal bleeding.  Musculoskeletal:  Negative for back pain.  Neurological:  Negative for syncope, light-headedness and headaches.  Psychiatric/Behavioral:  Negative for dysphoric mood.    Objective:  BP 130/70 (BP Location: Left Arm, Patient Position: Sitting, Cuff Size: Normal)   Pulse 68   Temp 98.1 F (36.7 C) (Temporal)   Ht 5\' 2"  (1.575 m)   Wt 149 lb 8 oz (67.8 kg)   SpO2 97%   BMI 27.34 kg/m   Wt Readings from Last 3 Encounters:  04/19/23 149 lb 8 oz (67.8 kg)  01/09/23 145 lb (65.8 kg)  12/07/22 145 lb 2 oz (65.8 kg)      Physical Exam Vitals and nursing note reviewed.  Constitutional:      General: She is not in acute distress.    Appearance: Normal appearance. She is well-developed. She is not ill-appearing or toxic-appearing.  HENT:     Head: Normocephalic.     Right Ear: Hearing, tympanic membrane, ear canal and external ear normal.     Left Ear: Hearing, tympanic membrane, ear canal and external ear normal.     Nose: Nose normal.  Eyes:     General: Lids are normal. Lids are everted, no foreign bodies appreciated.      Conjunctiva/sclera: Conjunctivae normal.     Pupils: Pupils are equal, round, and reactive to light.  Neck:     Thyroid: No thyroid mass or thyromegaly.     Vascular: No carotid bruit.     Trachea: Trachea normal.  Cardiovascular:     Rate and Rhythm: Normal rate and regular rhythm.     Heart sounds: Normal heart sounds, S1 normal and S2 normal. No murmur heard.    No gallop.  Pulmonary:     Effort: Pulmonary effort is normal. No respiratory distress.     Breath sounds: Normal breath sounds. No wheezing, rhonchi or rales.  Abdominal:     General: Bowel sounds are normal. There is no distension or abdominal bruit.     Palpations: Abdomen is soft. There is no fluid wave or mass.     Tenderness: There is no abdominal tenderness. There is no guarding or rebound.     Hernia: No hernia is present.  Musculoskeletal:     Cervical back: Normal range of motion and neck supple.  Lymphadenopathy:     Cervical: No cervical adenopathy.  Skin:    General: Skin is warm and dry.     Findings: No rash.  Neurological:     Mental Status: She is alert.     Cranial Nerves: No cranial nerve deficit.     Sensory: No sensory deficit.  Psychiatric:        Mood and Affect: Mood is not anxious or depressed.        Speech: Speech normal.        Behavior: Behavior normal. Behavior is cooperative.        Judgment: Judgment normal.      Diabetic foot exam: Normal inspection No skin breakdown No calluses  Normal DP pulses Normal sensation to light touch and monofilament Nails normal  Results for orders placed or performed in visit on 04/19/23  POCT glycosylated hemoglobin (Hb A1C)  Result Value Ref Range   Hemoglobin A1C 6.1 (A) 4.0 - 5.6 %   HbA1c POC (<> result, manual entry)     HbA1c, POC (prediabetic range)     HbA1c, POC (controlled diabetic range)    HM DIABETES FOOT EXAM  Result Value Ref Range   HM Diabetic Foot Exam done      COVID 19 screen:  No recent travel or known exposure  to COVID19 The patient denies respiratory symptoms of COVID 19 at this time. The importance of social distancing was discussed today.   Assessment and Plan The patient's preventative maintenance and recommended screening tests for an annual wellness exam were reviewed in full today. Brought up to date unless services declined.  Counselled on the importance of diet, exercise, and its role in overall health and mortality. The patient's FH and SH was reviewed, including their home life, tobacco status, and drug and alcohol status.    Vaccines: Flu given today, uptodate with  pneumovax, prevnar.   Completed shingrix at pharmacy. Mammo:   3D Had 4/18 nml, no family history of breast cancer... Wishes to continue after every 2 year. 05/2021, plan in 05/2023 DVE: pap and DVE not indicated. TAH. Sister with ovarian cancer   DEXA: Normal, 2018, 05/17/2021 osteopenia,  repeat 2 year,  work on walking, Ca and Vit D. Colon: 02/01/2005  Nml, Dr. Laural Benes, 12/2016 Cologuard, no further indicated Nonsmoker    Problem List Items Addressed This Visit     Coronary artery disease involving native coronary artery of native heart with unstable angina pectoris (HCC)    Chronic, stable, followed by cardiology.  Baby aspirin 81 mg daily and simvastatin 40 mg daily      Dementia (HCC)     Chronic stable...  Memantine 14 mg daily Doing significantly better with routine of nursing home and consistent medications.  Followed by neurology in the past      Relevant Medications   ALPRAZolam (XANAX) 0.25 MG tablet   GAD (generalized anxiety disorder)    Chronic, associated with dementia.   Using alprazolam 0.25 mg daily with additional 1 to 2 tablets  prn agitation and anxiety Sertraline 50 mg  p.o. daily      Relevant Medications   ALPRAZolam (XANAX) 0.25 MG tablet   Hyperlipidemia associated with type 2 diabetes mellitus (HCC) (Chronic)     Almost at goal on simvastatin.  LDL goal < 70 given CAD.       Hypertension associated with diabetes (HCC) (Chronic)    Stable, chronic.  Continue current medication.  Well-controlled on losartan hydrochlorothiazide 100/20 5/2 tablet daily, metoprolol XL 50 mg half tablet daily      Type 2 diabetes mellitus with circulatory disorder HTN (HCC) (Chronic)    Chronic, previously at goal diet controlled.      Relevant Orders   POCT glycosylated hemoglobin (Hb A1C) (Completed)   Other Visit Diagnoses     Routine general medical examination at a health care facility    -  Primary   Need for influenza vaccination       Relevant Orders   Flu Vaccine Trivalent High Dose (Fluad) (Completed)        Kerby Nora, MD

## 2023-04-19 NOTE — Patient Instructions (Signed)
Set up yearly eye exam for diabetes and have the opthalmologist send [place] a copy of the evaluation for the chart.

## 2023-04-19 NOTE — Assessment & Plan Note (Signed)
Stable, chronic.  Continue current medication.  Well-controlled on losartan hydrochlorothiazide 100/20 5/2 tablet daily, metoprolol XL 50 mg half tablet daily

## 2023-04-19 NOTE — Assessment & Plan Note (Addendum)
Chronic, associated with dementia.   Using alprazolam 0.25 mg daily with additional 1 to 2 tablets  prn agitation and anxiety Sertraline 50 mg p.o. daily

## 2023-04-19 NOTE — Assessment & Plan Note (Signed)
Chronic, previously at goal diet controlled.

## 2023-04-19 NOTE — Assessment & Plan Note (Signed)
Almost at goal on simvastatin.  LDL goal < 70 given CAD.

## 2023-04-19 NOTE — Assessment & Plan Note (Addendum)
Chronic stable...  Memantine 14 mg daily Doing significantly better with routine of nursing home and consistent medications.  Followed by neurology in the past

## 2023-04-19 NOTE — Assessment & Plan Note (Signed)
Chronic, stable, followed by cardiology.  Baby aspirin 81 mg daily and simvastatin 40 mg daily

## 2023-06-07 DIAGNOSIS — R41841 Cognitive communication deficit: Secondary | ICD-10-CM | POA: Diagnosis not present

## 2023-06-07 DIAGNOSIS — F0390 Unspecified dementia without behavioral disturbance: Secondary | ICD-10-CM | POA: Diagnosis not present

## 2023-06-26 ENCOUNTER — Other Ambulatory Visit: Payer: Self-pay | Admitting: Internal Medicine

## 2023-06-26 MED ORDER — ALPRAZOLAM 0.25 MG PO TABS: ORAL_TABLET | ORAL | 2 refills | Status: DC

## 2023-07-06 DIAGNOSIS — E118 Type 2 diabetes mellitus with unspecified complications: Secondary | ICD-10-CM | POA: Diagnosis not present

## 2023-07-06 DIAGNOSIS — E785 Hyperlipidemia, unspecified: Secondary | ICD-10-CM | POA: Diagnosis not present

## 2023-07-10 DIAGNOSIS — F411 Generalized anxiety disorder: Secondary | ICD-10-CM | POA: Diagnosis not present

## 2023-07-10 DIAGNOSIS — F0390 Unspecified dementia without behavioral disturbance: Secondary | ICD-10-CM | POA: Diagnosis not present

## 2023-07-10 DIAGNOSIS — F331 Major depressive disorder, recurrent, moderate: Secondary | ICD-10-CM | POA: Diagnosis not present

## 2023-07-16 DIAGNOSIS — F411 Generalized anxiety disorder: Secondary | ICD-10-CM | POA: Diagnosis not present

## 2023-07-16 DIAGNOSIS — F331 Major depressive disorder, recurrent, moderate: Secondary | ICD-10-CM | POA: Diagnosis not present

## 2023-07-16 DIAGNOSIS — F03918 Unspecified dementia, unspecified severity, with other behavioral disturbance: Secondary | ICD-10-CM | POA: Diagnosis not present

## 2023-07-20 DIAGNOSIS — F325 Major depressive disorder, single episode, in full remission: Secondary | ICD-10-CM | POA: Diagnosis not present

## 2023-07-20 DIAGNOSIS — I10 Essential (primary) hypertension: Secondary | ICD-10-CM | POA: Diagnosis not present

## 2023-07-20 DIAGNOSIS — F03B0 Unspecified dementia, moderate, without behavioral disturbance, psychotic disturbance, mood disturbance, and anxiety: Secondary | ICD-10-CM | POA: Diagnosis not present

## 2023-07-20 DIAGNOSIS — E1169 Type 2 diabetes mellitus with other specified complication: Secondary | ICD-10-CM | POA: Diagnosis not present

## 2023-07-30 DIAGNOSIS — F03918 Unspecified dementia, unspecified severity, with other behavioral disturbance: Secondary | ICD-10-CM | POA: Diagnosis not present

## 2023-08-01 DIAGNOSIS — E119 Type 2 diabetes mellitus without complications: Secondary | ICD-10-CM | POA: Diagnosis not present

## 2023-08-13 DIAGNOSIS — F02818 Dementia in other diseases classified elsewhere, unspecified severity, with other behavioral disturbance: Secondary | ICD-10-CM | POA: Diagnosis not present

## 2023-08-13 DIAGNOSIS — F411 Generalized anxiety disorder: Secondary | ICD-10-CM | POA: Diagnosis not present

## 2023-08-13 DIAGNOSIS — F331 Major depressive disorder, recurrent, moderate: Secondary | ICD-10-CM | POA: Diagnosis not present

## 2023-08-27 DIAGNOSIS — F411 Generalized anxiety disorder: Secondary | ICD-10-CM | POA: Diagnosis not present

## 2023-08-27 DIAGNOSIS — F03918 Unspecified dementia, unspecified severity, with other behavioral disturbance: Secondary | ICD-10-CM | POA: Diagnosis not present

## 2023-08-27 DIAGNOSIS — F331 Major depressive disorder, recurrent, moderate: Secondary | ICD-10-CM | POA: Diagnosis not present

## 2023-09-05 ENCOUNTER — Encounter (HOSPITAL_COMMUNITY): Payer: Self-pay | Admitting: Emergency Medicine

## 2023-09-05 ENCOUNTER — Other Ambulatory Visit: Payer: Self-pay

## 2023-09-05 ENCOUNTER — Emergency Department (HOSPITAL_COMMUNITY)
Admission: EM | Admit: 2023-09-05 | Discharge: 2023-09-05 | Disposition: A | Discharge status: Home / Self Care | Attending: Emergency Medicine | Admitting: Emergency Medicine

## 2023-09-05 DIAGNOSIS — I10 Essential (primary) hypertension: Secondary | ICD-10-CM | POA: Diagnosis not present

## 2023-09-05 DIAGNOSIS — R000 Tachycardia, unspecified: Secondary | ICD-10-CM | POA: Diagnosis not present

## 2023-09-05 DIAGNOSIS — R040 Epistaxis: Secondary | ICD-10-CM | POA: Diagnosis not present

## 2023-09-05 DIAGNOSIS — R58 Hemorrhage, not elsewhere classified: Secondary | ICD-10-CM | POA: Diagnosis not present

## 2023-09-05 DIAGNOSIS — Z7982 Long term (current) use of aspirin: Secondary | ICD-10-CM | POA: Diagnosis not present

## 2023-09-05 NOTE — ED Triage Notes (Signed)
 Pt from memory care unit. States that she started having uncontrolled nose bleed this morning and could not get it to stop. Pt denies any trauma.

## 2023-09-05 NOTE — ED Provider Notes (Signed)
 [place] EMERGENCY DEPARTMENT AT [facility] Provider Note   CSN: [id number] Arrival date & time: 09/05/23  0827     History  Chief Complaint  Patient presents with   Epistaxis    Janet Chapman is a 88 y.o. female.  HPI   88 year old female presents emergency department with concern for epistaxis.  Patient has had minor nosebleeds before but never this profuse.  States that it started this morning, could not get it to stop.  She tried direct pressure at home. Patient continues to have bleeding and some postnasal drip.  Denies any nausea/vomiting.  Not on anticoagulation.  Is otherwise compliant with medications.  No other recent/acute illness.  Home Medications Prior to Admission medications   Medication Sig Start Date End Date Taking? Authorizing Provider  ALPRAZolam  (XANAX ) 0.25 MG tablet TAKE 1 TABLET BY MOUTH  daily AND additional 1-2 tabs AS NEEDED FOR ANXIETY OR JITTERINESS 06/26/23   Tita Form, MD  aspirin  EC 81 MG tablet Take 81 mg by mouth daily with supper. Swallow whole.    [provider]  cyanocobalamin  (VITAMIN B12) 1000 MCG tablet Take 1,000 mcg by mouth daily.    [provider]  fluticasone  (FLONASE ) 50 MCG/ACT nasal spray Place 2 sprays into both nostrils daily. 03/15/22   Bedsole, Amy E, MD  ketoconazole  (NIZORAL ) 2 % shampoo Apply 1 application. topically every 14 (fourteen) days.    [provider]  losartan -hydrochlorothiazide  (HYZAAR) 100-25 MG tablet TAKE 1/2 TABLET BY MOUTH DAILY 10/26/22   Bedsole, Amy E, MD  memantine  (NAMENDA  XR) 14 MG CP24 24 hr capsule Take 1 capsule (14 mg total) by mouth daily. 07/11/22   Wess Hammed, NP  montelukast  (SINGULAIR ) 10 MG tablet TAKE 1 TABLET BY MOUTH AT BEDTIME 11/28/22   Bedsole, Amy E, MD  sertraline  (ZOLOFT ) 50 MG tablet Take 1 tablet (50 mg total) by mouth daily. 12/07/22   Bedsole, Amy E, MD  simvastatin  (ZOCOR ) 40 MG tablet TAKE 1 TABLET BY MOUTH EACH NIGHT AT BEDTIME 01/16/23    Bedsole, Amy E, MD      Allergies    Codeine    Review of Systems   Review of Systems  Constitutional:  Negative for fever.  HENT:  Positive for nosebleeds and postnasal drip. Negative for sinus pressure, sinus pain, sore throat, trouble swallowing and voice change.   Respiratory:  Negative for shortness of breath.   Cardiovascular:  Negative for chest pain.  Gastrointestinal:  Negative for nausea.  Skin:  Negative for rash.  Neurological:  Negative for light-headedness.    Physical Exam Updated Vital Signs BP (!) 152/71 (BP Location: Right Arm)   Pulse 75   Temp 98.7 F (37.1 C) (Oral)   Resp 20   Ht 5\' 3"  (1.6 m)   Wt 74.8 kg   SpO2 98%   BMI 29.21 kg/m  Physical Exam Vitals and nursing note reviewed.  Constitutional:      General: She is not in acute distress.    Appearance: Normal appearance.  HENT:     Head: Normocephalic.     Right Ear: External ear normal.     Left Ear: External ear normal.     Nose:     Comments: Brisk bright red bleeding from right nares with Merocel packing in place     Mouth/Throat:     Mouth: Mucous membranes are moist.  Cardiovascular:     Rate and Rhythm: Normal rate.  Pulmonary:  Effort: Pulmonary effort is normal. No respiratory distress.     Breath sounds: Normal breath sounds. No rales.  Abdominal:     Palpations: Abdomen is soft.     Tenderness: There is no abdominal tenderness.  Skin:    General: Skin is warm.  Neurological:     Mental Status: She is alert and oriented to person, place, and time. Mental status is at baseline.  Psychiatric:        Mood and Affect: Mood normal.     ED Results / Procedures / Treatments   Labs (all labs ordered are listed, but only abnormal results are displayed) Labs Reviewed - No data to display  EKG None  Radiology No results found.  Procedures Epistaxis Management  Date/Time: 09/05/2023 9:46 AM  Performed by: Flonnie Humphrey, DO Authorized by: Flonnie Humphrey, DO    Consent:    Consent obtained:  Verbal   Consent given by:  Patient   Risks discussed:  Bleeding, infection, nasal injury and pain   Alternatives discussed:  No treatment Universal protocol:    Patient identity confirmed:  Verbally with patient Anesthesia:    Anesthesia method:  None Procedure details:    Treatment site:  R anterior   Treatment method:  Nasal tampon and anterior pack   Treatment complexity:  Limited   Treatment episode: recurring   Post-procedure details:    Assessment:  Bleeding stopped   Procedure completion:  Tolerated     Medications Ordered in ED Medications - No data to display  ED Course/ Medical Decision Making/ A&P                                 Medical Decision Making  88 year old female presents emergency department with ongoing right sided epistaxis.  Not on anticoagulation, vital signs are stable.  Afrin and direct pressure attempted by ENT.  Anterior Merocel packing placed on arrival.  On my initial evaluation there is ongoing bright red bleeding dripping from the anterior part of the packing with some dripping blood in the posterior oropharynx.  Packing removed with brisk bright red bleeding noted.  No change with direct pressure, unable to visualize a vessel.  Rhino Rocket packing placed and balloon inflated, bleeding stopped.  Patient was able to drink and wash out her mouth and has no active posterior bleeding.  She feels well.  Rhino will stay in place and they have been providing ENT information to call to schedule an appointment today.  Patient at this time appears safe and stable for discharge and close outpatient follow up. Discharge plan and strict return to ED precautions discussed, patient verbalizes understanding and agreement.        Final Clinical Impression(s) / ED Diagnoses Final diagnoses:  None    Rx / DC Orders ED Discharge Orders     None         Flonnie Humphrey, DO 09/05/23 1028

## 2023-09-05 NOTE — Discharge Instructions (Addendum)
 You have been seen and discharged from the emergency department.  You were diagnosed with a right-sided nosebleed.  Ultimately nasal packing was placed.  Please call the provided ENT phone number to establish an appointment for packing removal and further evaluation and treatment.  Follow-up with your primary provider for further evaluation and further care. Take home medications as prescribed. If you have any worsening symptoms or further concerns for your health please return to an emergency department for further evaluation.

## 2023-09-06 ENCOUNTER — Telehealth: Payer: Self-pay

## 2023-09-06 DIAGNOSIS — E119 Type 2 diabetes mellitus without complications: Secondary | ICD-10-CM | POA: Diagnosis not present

## 2023-09-06 DIAGNOSIS — E785 Hyperlipidemia, unspecified: Secondary | ICD-10-CM | POA: Diagnosis not present

## 2023-09-06 NOTE — Transitions of Care (Post Inpatient/ED Visit) (Signed)
   09/06/2023  Name: Janet Chapman MRN: [id number] DOB: Oct 02, 1935  Today's TOC FU Call Status: Today's TOC FU Call Status:: Unsuccessful Call (1st Attempt) Unsuccessful Call (1st Attempt) Date: 09/06/23  Attempted to reach the patient regarding the most recent Inpatient/ED visit.  Follow Up Plan: Additional outreach attempts will be made to reach the patient to complete the Transitions of Care (Post Inpatient/ED visit) call.   Signature Darrall Ellison, LPN [facility] Nurse Health Advisor Direct Dial [phone]

## 2023-09-10 DIAGNOSIS — R040 Epistaxis: Secondary | ICD-10-CM | POA: Diagnosis not present

## 2023-09-10 DIAGNOSIS — F411 Generalized anxiety disorder: Secondary | ICD-10-CM | POA: Diagnosis not present

## 2023-09-10 DIAGNOSIS — F331 Major depressive disorder, recurrent, moderate: Secondary | ICD-10-CM | POA: Diagnosis not present

## 2023-09-10 DIAGNOSIS — J342 Deviated nasal septum: Secondary | ICD-10-CM | POA: Diagnosis not present

## 2023-09-10 DIAGNOSIS — F03918 Unspecified dementia, unspecified severity, with other behavioral disturbance: Secondary | ICD-10-CM | POA: Diagnosis not present

## 2023-09-10 DIAGNOSIS — J019 Acute sinusitis, unspecified: Secondary | ICD-10-CM | POA: Diagnosis not present

## 2023-09-10 NOTE — Transitions of Care (Post Inpatient/ED Visit) (Unsigned)
   09/10/2023  Name: Janet Chapman MRN: [id number] DOB: [birth year]  Today's TOC FU Call Status: Today's TOC FU Call Status:: Unsuccessful Call (2nd Attempt) Unsuccessful Call (1st Attempt) Date: 09/06/23 Unsuccessful Call (2nd Attempt) Date: 09/10/23  Attempted to reach the patient regarding the most recent Inpatient/ED visit.  Follow Up Plan: Additional outreach attempts will be made to reach the patient to complete the Transitions of Care (Post Inpatient/ED visit) call.   Signature Darrall Ellison, LPN [facility] Nurse Health Advisor Direct Dial [phone]

## 2023-09-11 NOTE — Transitions of Care (Post Inpatient/ED Visit) (Signed)
   09/11/2023  Name: Janet Chapman MRN: [id number] DOB: Mar 11, 1936  Today's TOC FU Call Status: Today's TOC FU Call Status:: Unsuccessful Call (3rd Attempt) Unsuccessful Call (1st Attempt) Date: 09/06/23 Unsuccessful Call (2nd Attempt) Date: 09/10/23 Unsuccessful Call (3rd Attempt) Date: 09/11/23  Attempted to reach the patient regarding the most recent Inpatient/ED visit.  Follow Up Plan: No further outreach attempts will be made at this time. We have been unable to contact the patient.  Signature Darrall Ellison, LPN [facility] Nurse Health Advisor Direct Dial [phone]

## 2023-09-28 DIAGNOSIS — E1169 Type 2 diabetes mellitus with other specified complication: Secondary | ICD-10-CM | POA: Diagnosis not present

## 2023-09-28 DIAGNOSIS — F03B0 Unspecified dementia, moderate, without behavioral disturbance, psychotic disturbance, mood disturbance, and anxiety: Secondary | ICD-10-CM | POA: Diagnosis not present

## 2023-09-28 DIAGNOSIS — I10 Essential (primary) hypertension: Secondary | ICD-10-CM | POA: Diagnosis not present

## 2023-10-01 ENCOUNTER — Telehealth: Payer: Self-pay | Admitting: *Deleted

## 2023-10-01 DIAGNOSIS — E1159 Type 2 diabetes mellitus with other circulatory complications: Secondary | ICD-10-CM

## 2023-10-01 DIAGNOSIS — E1169 Type 2 diabetes mellitus with other specified complication: Secondary | ICD-10-CM

## 2023-10-01 NOTE — Telephone Encounter (Signed)
-----   Message from Gerry Krone sent at 10/01/2023  9:25 AM EDT ----- Regarding: Lab orders for Tues, 6.3.25 Lab orders for a 6 month follow up appt

## 2023-10-09 DIAGNOSIS — F331 Major depressive disorder, recurrent, moderate: Secondary | ICD-10-CM | POA: Diagnosis not present

## 2023-10-09 DIAGNOSIS — F03918 Unspecified dementia, unspecified severity, with other behavioral disturbance: Secondary | ICD-10-CM | POA: Diagnosis not present

## 2023-10-09 DIAGNOSIS — F411 Generalized anxiety disorder: Secondary | ICD-10-CM | POA: Diagnosis not present

## 2023-10-15 DIAGNOSIS — F39 Unspecified mood [affective] disorder: Secondary | ICD-10-CM | POA: Diagnosis not present

## 2023-10-15 DIAGNOSIS — F419 Anxiety disorder, unspecified: Secondary | ICD-10-CM | POA: Diagnosis not present

## 2023-10-16 ENCOUNTER — Other Ambulatory Visit: Payer: PPO

## 2023-10-17 ENCOUNTER — Emergency Department (HOSPITAL_COMMUNITY)
Admission: EM | Admit: 2023-10-17 | Discharge: 2023-10-17 | Disposition: A | Discharge status: Home / Self Care | Attending: Emergency Medicine | Admitting: Emergency Medicine

## 2023-10-17 ENCOUNTER — Encounter (HOSPITAL_COMMUNITY): Payer: Self-pay

## 2023-10-17 ENCOUNTER — Emergency Department (HOSPITAL_COMMUNITY)

## 2023-10-17 ENCOUNTER — Other Ambulatory Visit: Payer: Self-pay

## 2023-10-17 DIAGNOSIS — G319 Degenerative disease of nervous system, unspecified: Secondary | ICD-10-CM | POA: Diagnosis not present

## 2023-10-17 DIAGNOSIS — S0990XA Unspecified injury of head, initial encounter: Secondary | ICD-10-CM | POA: Diagnosis not present

## 2023-10-17 DIAGNOSIS — W19XXXA Unspecified fall, initial encounter: Secondary | ICD-10-CM | POA: Diagnosis not present

## 2023-10-17 DIAGNOSIS — S199XXA Unspecified injury of neck, initial encounter: Secondary | ICD-10-CM | POA: Diagnosis not present

## 2023-10-17 DIAGNOSIS — S0093XA Contusion of unspecified part of head, initial encounter: Secondary | ICD-10-CM | POA: Diagnosis not present

## 2023-10-17 DIAGNOSIS — S0083XA Contusion of other part of head, initial encounter: Secondary | ICD-10-CM | POA: Diagnosis not present

## 2023-10-17 DIAGNOSIS — W1830XA Fall on same level, unspecified, initial encounter: Secondary | ICD-10-CM | POA: Insufficient documentation

## 2023-10-17 DIAGNOSIS — Z7982 Long term (current) use of aspirin: Secondary | ICD-10-CM | POA: Insufficient documentation

## 2023-10-17 MED ORDER — BACITRACIN ZINC 500 UNIT/GM EX OINT
TOPICAL_OINTMENT | CUTANEOUS | Status: AC
  Filled 2023-10-17: qty 0.9

## 2023-10-17 MED ORDER — ACETAMINOPHEN 500 MG PO TABS
1000.0000 mg | ORAL_TABLET | Freq: Once | ORAL | Status: AC
  Administered 2023-10-17: 1000 mg via ORAL
  Filled 2023-10-17: qty 2

## 2023-10-17 NOTE — ED Notes (Signed)
 Patient transported to CT

## 2023-10-17 NOTE — ED Notes (Signed)
Pt ambulatory to restroom with minimal assistance

## 2023-10-17 NOTE — ED Notes (Signed)
 Wound on R forehead cleaned, bacitracin applied, and dressed.

## 2023-10-17 NOTE — ED Triage Notes (Signed)
 PT arrives via EMS from [facility]. Pt reportedly tripped over her own feet and struck her head on a column. Fall was witnessed. She has large hematoma and a laceration to right side of her forehead. No Loc, no blood thinners. Pt is oriented to name, dob, and location. Confused about current date. This is her reported baseline. Hx of dementia.

## 2023-10-17 NOTE — Discharge Instructions (Signed)
 Return for any problem.  ?

## 2023-10-17 NOTE — ED Provider Notes (Signed)
 [place] EMERGENCY DEPARTMENT AT [facility] Provider Note   CSN: [id number] Arrival date & time: 10/17/23  1818     History {Add pertinent medical, surgical, social history, OB history to HPI:1} Chief Complaint  Patient presents with   Head Injury    Janet Chapman is a 88 y.o. female.  88 year old female with prior medical history as detailed below presents for evaluation.  Patient reports that she was [facility] assisted living.  She lost her step and fell.  She struck her head against a column.  She had hematoma to the right forehead.  She did not pass out.  She is not on anticoagulation.  Per family at bedside, patient is at baseline mental status here in the ED.  Tetanus is up-to-date.  The history is provided by the patient.       Home Medications Prior to Admission medications   Medication Sig Start Date End Date Taking? Authorizing Provider  ALPRAZolam  (XANAX ) 0.25 MG tablet TAKE 1 TABLET BY MOUTH  daily AND additional 1-2 tabs AS NEEDED FOR ANXIETY OR JITTERINESS 06/26/23   Tita Form, MD  aspirin  EC 81 MG tablet Take 81 mg by mouth daily with supper. Swallow whole.    [provider]  cyanocobalamin  (VITAMIN B12) 1000 MCG tablet Take 1,000 mcg by mouth daily.    [provider]  fluticasone  (FLONASE ) 50 MCG/ACT nasal spray Place 2 sprays into both nostrils daily. 03/15/22   Bedsole, Amy E, MD  ketoconazole  (NIZORAL ) 2 % shampoo Apply 1 application. topically every 14 (fourteen) days.    [provider]  losartan -hydrochlorothiazide  (HYZAAR) 100-25 MG tablet TAKE 1/2 TABLET BY MOUTH DAILY 10/26/22   Bedsole, Amy E, MD  memantine  (NAMENDA  XR) 14 MG CP24 24 hr capsule Take 1 capsule (14 mg total) by mouth daily. 07/11/22   Wess Hammed, NP  montelukast  (SINGULAIR ) 10 MG tablet TAKE 1 TABLET BY MOUTH AT BEDTIME 11/28/22   Bedsole, Amy E, MD  sertraline  (ZOLOFT ) 50 MG tablet Take 1 tablet (50 mg total) by mouth daily. 12/07/22   Bedsole,  Amy E, MD  simvastatin  (ZOCOR ) 40 MG tablet TAKE 1 TABLET BY MOUTH EACH NIGHT AT BEDTIME 01/16/23   Bedsole, Amy E, MD      Allergies    Codeine    Review of Systems   Review of Systems  All other systems reviewed and are negative.   Physical Exam Updated Vital Signs BP (!) 159/76 (BP Location: Right Arm)   Pulse 67   Temp (!) 97.5 F (36.4 C) (Oral)   Resp 17   SpO2 99%  Physical Exam Vitals and nursing note reviewed.  Constitutional:      General: She is not in acute distress.    Appearance: Normal appearance. She is well-developed.  HENT:     Head: Normocephalic.     Comments: Contusion/abrasion to right forehead Eyes:     Conjunctiva/sclera: Conjunctivae normal.     Pupils: Pupils are equal, round, and reactive to light.  Cardiovascular:     Rate and Rhythm: Normal rate and regular rhythm.     Heart sounds: Normal heart sounds.  Pulmonary:     Effort: Pulmonary effort is normal. No respiratory distress.     Breath sounds: Normal breath sounds.  Abdominal:     General: There is no distension.     Palpations: Abdomen is soft.     Tenderness: There is no abdominal tenderness.  Musculoskeletal:  General: No deformity. Normal range of motion.     Cervical back: Normal range of motion and neck supple.  Skin:    General: Skin is warm and dry.  Neurological:     General: No focal deficit present.     Mental Status: She is alert and oriented to person, place, and time.     ED Results / Procedures / Treatments   Labs (all labs ordered are listed, but only abnormal results are displayed) Labs Reviewed - No data to display  EKG None  Radiology No results found.  Procedures Procedures  {Document cardiac monitor, telemetry assessment procedure when appropriate:1}  Medications Ordered in ED Medications - No data to display  ED Course/ Medical Decision Making/ A&P   {   Click here for ABCD2, HEART and other calculatorsREFRESH Note before signing :1}                               Medical Decision Making Amount and/or Complexity of Data Reviewed Radiology: ordered.    Medical Screen Complete  This patient presented to the ED with complaint of fall, head injury.  This complaint involves an extensive number of treatment options. The initial differential diagnosis includes, but is not limited to, ***  This presentation is: {IllnessRisk:19196::"***","Acute","Chronic","Self-Limited","Previously Undiagnosed","Uncertain Prognosis","Complicated","Systemic Symptoms","Threat to Life/Bodily Function"}    Co morbidities that complicated the patient's evaluation  ***   Additional history obtained:  Additional history obtained from {History source:19196::"EMS","Spouse","Family","Friend","Caregiver"} External records from outside sources obtained and reviewed including prior ED visits and prior Inpatient records.    Lab Tests:  I ordered and personally interpreted labs.  The pertinent results include:  ***   Imaging Studies ordered:  I ordered imaging studies including ***  I independently visualized and interpreted obtained imaging which showed *** I agree with the radiologist interpretation.   Cardiac Monitoring:  The patient was maintained on a cardiac monitor.  I personally viewed and interpreted the cardiac monitor which showed an underlying rhythm of: ***   Medicines ordered:  I ordered medication including ***  for ***  Reevaluation of the patient after these medicines showed that the patient: {resolved/improved/worsened:23923::"improved"}    Test Considered:  ***   Critical Interventions:  ***   Consultations Obtained:  I consulted ***,  and discussed lab and imaging findings as well as pertinent plan of care.    Problem List / ED Course:  ***   Reevaluation:  After the interventions noted above, I reevaluated the patient and found that they have:  {resolved/improved/worsened:23923::"improved"}   Social Determinants of Health:  ***   Disposition:  After consideration of the diagnostic results and the patients response to treatment, I feel that the patent would benefit from ***.    {Document critical care time when appropriate:1} {Document review of labs and clinical decision tools ie heart score, Chads2Vasc2 etc:1}  {Document your independent review of radiology images, and any outside records:1} {Document your discussion with family members, caretakers, and with consultants:1} {Document social determinants of health affecting pt's care:1} {Document your decision making why or why not admission, treatments were needed:1} Final Clinical Impression(s) / ED Diagnoses Final diagnoses:  None    Rx / DC Orders ED Discharge Orders     None

## 2023-10-18 DIAGNOSIS — F411 Generalized anxiety disorder: Secondary | ICD-10-CM | POA: Diagnosis not present

## 2023-10-18 DIAGNOSIS — F331 Major depressive disorder, recurrent, moderate: Secondary | ICD-10-CM | POA: Diagnosis not present

## 2023-10-21 ENCOUNTER — Emergency Department (HOSPITAL_COMMUNITY)
Admission: EM | Admit: 2023-10-21 | Discharge: 2023-10-21 | Disposition: A | Discharge status: Home / Self Care | Attending: Emergency Medicine | Admitting: Emergency Medicine

## 2023-10-21 DIAGNOSIS — R040 Epistaxis: Secondary | ICD-10-CM | POA: Insufficient documentation

## 2023-10-21 DIAGNOSIS — F0390 Unspecified dementia without behavioral disturbance: Secondary | ICD-10-CM | POA: Diagnosis not present

## 2023-10-21 DIAGNOSIS — Z7982 Long term (current) use of aspirin: Secondary | ICD-10-CM | POA: Diagnosis not present

## 2023-10-21 DIAGNOSIS — R58 Hemorrhage, not elsewhere classified: Secondary | ICD-10-CM | POA: Diagnosis not present

## 2023-10-21 NOTE — ED Notes (Signed)
 Ice pack placed on patient's face per Dr. Alda Amas request.

## 2023-10-21 NOTE — ED Triage Notes (Signed)
 Patient BIB EMS for nose bleed. No longer bleeding per EMS. Has history of nose bleeds. Has happened in past and treated at ED. Patient is on ASA, no other blood thinners. Patient alert and oriented x 2, forgetful- Dementia. Patient has black eyes but from fall last week and no bleeding with that fall. This is new issue that started today per facility staff.   EMS VS: 152/84 95% RA P 72

## 2023-10-21 NOTE — Discharge Instructions (Signed)
 Follow-up with your ENT doctor tomorrow

## 2023-10-21 NOTE — ED Notes (Signed)
 Dr. Leighton Punches at bedside packing patients nose, balloon placed. JRPRN

## 2023-10-21 NOTE — ED Notes (Signed)
 Afrin and ENT cart at bedside for MD. JRPRN

## 2023-10-21 NOTE — ED Provider Notes (Signed)
 [place] EMERGENCY DEPARTMENT AT [facility] Provider Note   CSN: [id number] Arrival date & time: 10/21/23  1041     History  No chief complaint on file.   Janet Chapman is a 88 y.o. female.  88 year old patient presents due to nosebleed from right nares.  She does not take any blood thinners.  Does have some history of dementia in the past.  EMS was called and when he got there there was no bleeding appreciated.  She is on aspirin .  Currently asymptomatic       Home Medications Prior to Admission medications   Medication Sig Start Date End Date Taking? Authorizing Provider  ALPRAZolam  (XANAX ) 0.25 MG tablet TAKE 1 TABLET BY MOUTH  daily AND additional 1-2 tabs AS NEEDED FOR ANXIETY OR JITTERINESS 06/26/23   Tita Form, MD  aspirin  EC 81 MG tablet Take 81 mg by mouth daily with supper. Swallow whole.    [provider]  cyanocobalamin  (VITAMIN B12) 1000 MCG tablet Take 1,000 mcg by mouth daily.    [provider]  fluticasone  (FLONASE ) 50 MCG/ACT nasal spray Place 2 sprays into both nostrils daily. 03/15/22   Bedsole, Amy E, MD  ketoconazole  (NIZORAL ) 2 % shampoo Apply 1 application. topically every 14 (fourteen) days.    [provider]  losartan -hydrochlorothiazide  (HYZAAR) 100-25 MG tablet TAKE 1/2 TABLET BY MOUTH DAILY 10/26/22   Bedsole, Amy E, MD  memantine  (NAMENDA  XR) 14 MG CP24 24 hr capsule Take 1 capsule (14 mg total) by mouth daily. 07/11/22   Wess Hammed, NP  montelukast  (SINGULAIR ) 10 MG tablet TAKE 1 TABLET BY MOUTH AT BEDTIME 11/28/22   Bedsole, Amy E, MD  sertraline  (ZOLOFT ) 50 MG tablet Take 1 tablet (50 mg total) by mouth daily. 12/07/22   Bedsole, Amy E, MD  simvastatin  (ZOCOR ) 40 MG tablet TAKE 1 TABLET BY MOUTH EACH NIGHT AT BEDTIME 01/16/23   Cherlyn Cornet, Amy E, MD      Allergies    Codeine    Review of Systems   Review of Systems  All other systems reviewed and are negative.   Physical Exam Updated Vital Signs BP  (!) 147/58   Pulse 65   Temp 97.8 F (36.6 C) (Oral)   Resp 18   SpO2 95%  Physical Exam Vitals and nursing note reviewed.  Constitutional:      General: She is not in acute distress.    Appearance: Normal appearance. She is well-developed. She is not toxic-appearing.  HENT:     Head: Normocephalic and atraumatic.     Nose: No nasal deformity.     Right Nostril: No epistaxis.  Eyes:     General: Lids are normal.     Conjunctiva/sclera: Conjunctivae normal.     Pupils: Pupils are equal, round, and reactive to light.  Neck:     Thyroid : No thyroid  mass.     Trachea: No tracheal deviation.  Cardiovascular:     Rate and Rhythm: Normal rate and regular rhythm.     Heart sounds: Normal heart sounds. No murmur heard.    No gallop.  Pulmonary:     Effort: Pulmonary effort is normal. No respiratory distress.     Breath sounds: Normal breath sounds. No stridor. No decreased breath sounds, wheezing, rhonchi or rales.  Abdominal:     General: There is no distension.     Palpations: Abdomen is soft.     Tenderness: There is no abdominal tenderness. There is no  rebound.  Musculoskeletal:        General: No tenderness. Normal range of motion.     Cervical back: Normal range of motion and neck supple.  Skin:    General: Skin is warm and dry.     Findings: No abrasion or rash.  Neurological:     Mental Status: She is alert and oriented to person, place, and time. Mental status is at baseline.     GCS: GCS eye subscore is 4. GCS verbal subscore is 5. GCS motor subscore is 6.     Cranial Nerves: No cranial nerve deficit.     Sensory: No sensory deficit.     Motor: Motor function is intact.  Psychiatric:        Attention and Perception: Attention normal.        Speech: Speech normal.        Behavior: Behavior normal.     ED Results / Procedures / Treatments   Labs (all labs ordered are listed, but only abnormal results are displayed) Labs Reviewed - No data to  display  EKG None  Radiology No results found.  Procedures Epistaxis Management  Date/Time: 10/21/2023 3:19 PM  Performed by: Janet Repine, MD Authorized by: Janet Repine, MD   Consent:    Consent obtained:  Verbal   Consent given by:  Patient   Risks, benefits, and alternatives were discussed: yes   Universal protocol:    Immediately prior to procedure, a time out was called: yes     Patient identity confirmed:  Verbally with patient Procedure details:    Treatment site:  R anterior and R posterior   Treatment method:  Nasal tampon and nasal balloon   Treatment complexity:  Extensive   Treatment episode: recurring   Post-procedure details:    Assessment:  Bleeding decreased   Procedure completion:  Tolerated     Medications Ordered in ED Medications - No data to display  ED Course/ Medical Decision Making/ A&P                                 Medical Decision Making  Patient is nose packed here in bleeding is controlled.  Will follow-up with ENT Janet Chapman.        Final Clinical Impression(s) / ED Diagnoses Final diagnoses:  None    Rx / DC Orders ED Discharge Orders     None         Janet Repine, MD 10/21/23 1520

## 2023-10-22 ENCOUNTER — Telehealth: Payer: Self-pay

## 2023-10-22 NOTE — Telephone Encounter (Signed)
 Copied from CRM [id number]. Topic: Appointments - Appointment Cancel/Reschedule >> Oct 22, 2023  8:21 AM Rosaria Common wrote: Pt's nephew Rome Cluck calling to state that pt was discharged from ER 6/8 and also calling to cancel appt on 6/10 due to transportation concerns and would like to speak with nurse before rescheduling another appt. Callback number is [phone].

## 2023-10-23 ENCOUNTER — Ambulatory Visit: Payer: PPO | Admitting: Family Medicine

## 2023-10-23 DIAGNOSIS — I2510 Atherosclerotic heart disease of native coronary artery without angina pectoris: Secondary | ICD-10-CM | POA: Diagnosis not present

## 2023-10-23 DIAGNOSIS — R2681 Unsteadiness on feet: Secondary | ICD-10-CM | POA: Diagnosis not present

## 2023-10-23 DIAGNOSIS — R278 Other lack of coordination: Secondary | ICD-10-CM | POA: Diagnosis not present

## 2023-10-23 DIAGNOSIS — M6281 Muscle weakness (generalized): Secondary | ICD-10-CM | POA: Diagnosis not present

## 2023-10-23 DIAGNOSIS — E1159 Type 2 diabetes mellitus with other circulatory complications: Secondary | ICD-10-CM | POA: Diagnosis not present

## 2023-10-25 ENCOUNTER — Other Ambulatory Visit: Payer: Self-pay | Admitting: Internal Medicine

## 2023-10-25 MED ORDER — ALPRAZOLAM 0.5 MG PO TABS: 0.2500 mg | ORAL_TABLET | Freq: Two times a day (BID) | ORAL | 2 refills | Status: AC

## 2023-10-26 DIAGNOSIS — J019 Acute sinusitis, unspecified: Secondary | ICD-10-CM | POA: Diagnosis not present

## 2023-10-26 DIAGNOSIS — R040 Epistaxis: Secondary | ICD-10-CM | POA: Diagnosis not present

## 2023-11-02 DIAGNOSIS — E119 Type 2 diabetes mellitus without complications: Secondary | ICD-10-CM | POA: Diagnosis not present

## 2023-11-02 DIAGNOSIS — E785 Hyperlipidemia, unspecified: Secondary | ICD-10-CM | POA: Diagnosis not present

## 2023-11-05 DIAGNOSIS — F331 Major depressive disorder, recurrent, moderate: Secondary | ICD-10-CM | POA: Diagnosis not present

## 2023-11-05 DIAGNOSIS — F03918 Unspecified dementia, unspecified severity, with other behavioral disturbance: Secondary | ICD-10-CM | POA: Diagnosis not present

## 2023-11-05 DIAGNOSIS — F411 Generalized anxiety disorder: Secondary | ICD-10-CM | POA: Diagnosis not present

## 2023-11-07 DIAGNOSIS — L89159 Pressure ulcer of sacral region, unspecified stage: Secondary | ICD-10-CM | POA: Diagnosis not present

## 2023-11-13 DIAGNOSIS — R2681 Unsteadiness on feet: Secondary | ICD-10-CM | POA: Diagnosis not present

## 2023-11-13 DIAGNOSIS — M6281 Muscle weakness (generalized): Secondary | ICD-10-CM | POA: Diagnosis not present

## 2023-11-13 DIAGNOSIS — E1159 Type 2 diabetes mellitus with other circulatory complications: Secondary | ICD-10-CM | POA: Diagnosis not present

## 2023-11-13 DIAGNOSIS — I2510 Atherosclerotic heart disease of native coronary artery without angina pectoris: Secondary | ICD-10-CM | POA: Diagnosis not present

## 2023-11-14 ENCOUNTER — Other Ambulatory Visit: Payer: Self-pay

## 2023-11-14 ENCOUNTER — Encounter (HOSPITAL_COMMUNITY): Payer: Self-pay | Admitting: Emergency Medicine

## 2023-11-14 ENCOUNTER — Emergency Department (HOSPITAL_COMMUNITY)
Admission: EM | Admit: 2023-11-14 | Discharge: 2023-11-14 | Disposition: A | Discharge status: Skilled Nursing Facility | Source: Skilled Nursing Facility | Attending: Emergency Medicine | Admitting: Emergency Medicine

## 2023-11-14 DIAGNOSIS — Z79899 Other long term (current) drug therapy: Secondary | ICD-10-CM | POA: Insufficient documentation

## 2023-11-14 DIAGNOSIS — R040 Epistaxis: Secondary | ICD-10-CM | POA: Diagnosis not present

## 2023-11-14 DIAGNOSIS — R58 Hemorrhage, not elsewhere classified: Secondary | ICD-10-CM | POA: Diagnosis not present

## 2023-11-14 DIAGNOSIS — I10 Essential (primary) hypertension: Secondary | ICD-10-CM | POA: Insufficient documentation

## 2023-11-14 DIAGNOSIS — R350 Frequency of micturition: Secondary | ICD-10-CM | POA: Diagnosis not present

## 2023-11-14 DIAGNOSIS — Z7982 Long term (current) use of aspirin: Secondary | ICD-10-CM | POA: Diagnosis not present

## 2023-11-14 LAB — CBC WITH DIFFERENTIAL/PLATELET
Abs Immature Granulocytes: 0.01 10*3/uL (ref 0.00–0.07)
Basophils Absolute: 0 10*3/uL (ref 0.0–0.1)
Basophils Relative: 0 %
Eosinophils Absolute: 0.1 10*3/uL (ref 0.0–0.5)
Eosinophils Relative: 2 %
HCT: 37.1 % (ref 36.0–46.0)
Hemoglobin: 12.4 g/dL (ref 12.0–15.0)
Immature Granulocytes: 0 %
Lymphocytes Relative: 26 %
Lymphs Abs: 1.5 10*3/uL (ref 0.7–4.0)
MCH: 32.8 pg (ref 26.0–34.0)
MCHC: 33.4 g/dL (ref 30.0–36.0)
MCV: 98.1 fL (ref 80.0–100.0)
Monocytes Absolute: 0.7 10*3/uL (ref 0.1–1.0)
Monocytes Relative: 11 %
Neutro Abs: 3.6 10*3/uL (ref 1.7–7.7)
Neutrophils Relative %: 61 %
Platelets: 240 10*3/uL (ref 150–400)
RBC: 3.78 MIL/uL — ABNORMAL LOW (ref 3.87–5.11)
RDW: 14.6 % (ref 11.5–15.5)
WBC: 6 10*3/uL (ref 4.0–10.5)
nRBC: 0 % (ref 0.0–0.2)

## 2023-11-14 LAB — COMPREHENSIVE METABOLIC PANEL WITH GFR
ALT: 15 U/L (ref 0–44)
AST: 19 U/L (ref 15–41)
Albumin: 4.4 g/dL (ref 3.5–5.0)
Alkaline Phosphatase: 87 U/L (ref 38–126)
Anion gap: 9 (ref 5–15)
BUN: 15 mg/dL (ref 8–23)
CO2: 29 mmol/L (ref 22–32)
Calcium: 9.6 mg/dL (ref 8.9–10.3)
Chloride: 96 mmol/L — ABNORMAL LOW (ref 98–111)
Creatinine, Ser: 0.69 mg/dL (ref 0.44–1.00)
GFR, Estimated: >60 mL/min (ref >60)
Glucose, Bld: 118 mg/dL — ABNORMAL HIGH (ref 70–99)
Potassium: 3.7 mmol/L (ref 3.5–5.1)
Sodium: 134 mmol/L — ABNORMAL LOW (ref 135–145)
Total Bilirubin: 0.7 mg/dL (ref 0.0–1.2)
Total Protein: 7.2 g/dL (ref 6.5–8.1)

## 2023-11-14 LAB — URINALYSIS, ROUTINE W REFLEX MICROSCOPIC
Bilirubin Urine: NEGATIVE
Glucose, UA: NEGATIVE mg/dL
Hgb urine dipstick: NEGATIVE
Ketones, ur: NEGATIVE mg/dL
Leukocytes,Ua: NEGATIVE
Nitrite: NEGATIVE
Protein, ur: NEGATIVE mg/dL
Specific Gravity, Urine: 1.01 (ref 1.005–1.030)
pH: 7 (ref 5.0–8.0)

## 2023-11-14 LAB — PROTIME-INR
INR: 0.9 (ref 0.8–1.2)
Prothrombin Time: 13 s (ref 11.4–15.2)

## 2023-11-14 MED ORDER — LIDOCAINE HCL URETHRAL/MUCOSAL 2 % EX GEL
1.0000 | Freq: Once | CUTANEOUS | Status: AC
  Administered 2023-11-14: 1 via TOPICAL
  Filled 2023-11-14: qty 11

## 2023-11-14 MED ORDER — OXYMETAZOLINE HCL 0.05 % NA SOLN
1.0000 | Freq: Once | NASAL | Status: AC
  Administered 2023-11-14: 1 via NASAL
  Filled 2023-11-14: qty 30

## 2023-11-14 MED ORDER — SILVER NITRATE-POT NITRATE 75-25 % EX MISC
1.0000 | Freq: Once | CUTANEOUS | Status: AC
  Administered 2023-11-14: 1 via TOPICAL
  Filled 2023-11-14: qty 10

## 2023-11-14 NOTE — ED Triage Notes (Addendum)
 Patient presents from [facility] assisted living due to 3 episodes of epistaxis this morning, she has been having nose bleeds over the past month. Bleeding was controlled prior to EMS arrival. She does not take blood thinners.    HX Dementia  EMS vitals 200/100 ->180/90 (initial -> final BP) 70 HR 18 RR 96% SPO2 on room air 117 CBG

## 2023-11-14 NOTE — ED Provider Notes (Signed)
 [place] EMERGENCY DEPARTMENT AT [facility] Provider Note   CSN: [id number] Arrival date & time: 11/14/23  1110     Patient presents with: Epistaxis   Janet Chapman is a 88 y.o. female.   HPI 88 year old female presents with nosebleeds.  History is primarily from the nurse at her facility, [facility].  Had 3 different nosebleeds this morning which required ice to stop and were severe.  Patient takes aspirin  but no other blood thinners.  This is also the third different nosebleeding episode over the past month.  Janet Chapman was sent here due to these nosebleeds.  Was noted to be pretty hypertensive with EMS but the nurse reports that Janet Chapman had her blood pressure meds this morning and in general her blood pressure has not been an issue.  Patient is currently asymptomatic.   Prior to Admission medications   Medication Sig Start Date End Date Taking? Authorizing Provider  ALPRAZolam  (XANAX ) 0.5 MG tablet Take 0.5 tablets (0.25 mg total) by mouth 2 (two) times daily. TAKE 1 TABLET BY MOUTH  daily AND additional 1-2 tabs AS NEEDED FOR ANXIETY OR JITTERINESS 10/25/23   Caleen Dirks, MD  aspirin  EC 81 MG tablet Take 81 mg by mouth daily with supper. Swallow whole.    [provider]  cyanocobalamin  (VITAMIN B12) 1000 MCG tablet Take 1,000 mcg by mouth daily.    [provider]  fluticasone  (FLONASE ) 50 MCG/ACT nasal spray Place 2 sprays into both nostrils daily. 03/15/22   Bedsole, Amy E, MD  ketoconazole  (NIZORAL ) 2 % shampoo Apply 1 application. topically every 14 (fourteen) days.    [provider]  losartan -hydrochlorothiazide  (HYZAAR) 100-25 MG tablet TAKE 1/2 TABLET BY MOUTH DAILY 10/26/22   Bedsole, Amy E, MD  memantine  (NAMENDA  XR) 14 MG CP24 24 hr capsule Take 1 capsule (14 mg total) by mouth daily. 07/11/22   Gayland Lauraine PARAS, NP  montelukast  (SINGULAIR ) 10 MG tablet TAKE 1 TABLET BY MOUTH AT BEDTIME 11/28/22   Bedsole, Amy E, MD  sertraline  (ZOLOFT ) 50 MG  tablet Take 1 tablet (50 mg total) by mouth daily. 12/07/22   Bedsole, Amy E, MD  simvastatin  (ZOCOR ) 40 MG tablet TAKE 1 TABLET BY MOUTH EACH NIGHT AT BEDTIME 01/16/23   Bedsole, Amy E, MD    Allergies: Codeine    Review of Systems  Unable to perform ROS: Dementia  HENT:  Positive for nosebleeds.     Updated Vital Signs BP (!) 165/71   Pulse 72   Temp 98 F (36.7 C) (Oral)   Resp 16   SpO2 97%   Physical Exam Vitals and nursing note reviewed.  Constitutional:      Appearance: Janet Chapman is well-developed.  HENT:     Head: Normocephalic and atraumatic.     Nose:     Comments: There is a friable area with dried blood in the inner right nare distally. Cardiovascular:     Rate and Rhythm: Normal rate and regular rhythm.     Heart sounds: Normal heart sounds.  Pulmonary:     Effort: Pulmonary effort is normal.  Abdominal:     General: There is no distension.  Skin:    General: Skin is warm and dry.  Neurological:     Mental Status: Janet Chapman is alert.     (all labs ordered are listed, but only abnormal results are displayed) Labs Reviewed  COMPREHENSIVE METABOLIC PANEL WITH GFR - Abnormal; Notable for the following components:  Result Value   Sodium 134 (*)    Chloride 96 (*)    Glucose, Bld 118 (*)    All other components within normal limits  CBC WITH DIFFERENTIAL/PLATELET - Abnormal; Notable for the following components:   RBC 3.78 (*)    All other components within normal limits  PROTIME-INR  URINALYSIS, ROUTINE W REFLEX MICROSCOPIC    EKG: None  Radiology: No results found.   Epistaxis Management  Date/Time: 11/14/2023 1:46 PM  Performed by: Freddi Hamilton, MD Authorized by: Freddi Hamilton, MD   Universal protocol:    Patient identity confirmed:  Verbally with patient Anesthesia:    Anesthesia method:  Topical application   Topical anesthetic:  Lidocaine  gel Procedure details:    Treatment site:  R anterior   Treatment method:  Silver nitrate    Treatment complexity:  Limited   Treatment episode: initial   Post-procedure details:    Assessment:  No improvement   Procedure completion:  Tolerated well, no immediate complications    Medications Ordered in the ED  oxymetazoline (AFRIN) 0.05 % nasal spray 1 spray (1 spray Each Nare Given 11/14/23 1157)  lidocaine  (XYLOCAINE ) 2 % jelly 1 Application (1 Application Topical Given 11/14/23 1230)  silver nitrate applicators applicator 1 Stick (1 Stick Topical Given 11/14/23 1230)                                    Medical Decision Making Amount and/or Complexity of Data Reviewed Independent Historian:     Details: Nursing home staff Labs: ordered.    Details: Normal hemoglobin  Risk OTC drugs. Prescription drug management.   Patient does not have any active bleeding but does have a friable area in her anterior right nare which was treated with silver nitrate as above.  No further bleeding or issues.  Hemoglobin is normal/stable.  Caregiver who arrived is also concerned because the patient has had multiple episodes of urination while in the ED, due to this frequency wants to make sure there is no UTI.  Patient also has frequent urine which Janet Chapman gets nervous.  Urinalysis is unremarkable.  Janet Chapman is a little hypertensive but has significantly improved since EMS arrival and ED arrival.  Will have her follow-up with her PCP in regards to the hypertension.  Appears stable for discharge, will give ENT follow-up instructions.     Final diagnoses:  Right-sided epistaxis  Urinary frequency    ED Discharge Orders     None          Freddi Hamilton, MD 11/14/23 1444

## 2023-11-14 NOTE — Discharge Instructions (Signed)
 You may use the Afrin nasal spray 1 spray in each nostril for a total of 72 hours.  You are already given the first dose of this this morning in the emergency department.  Follow-up with the ENT specialist for your recurrent nosebleeds.  If you develop a recurrent nosebleed that will not stop with pressure, if you develop dizziness or lightheadedness, or any other new/concerning symptoms then return to the ER for evaluation.

## 2023-11-30 DIAGNOSIS — E785 Hyperlipidemia, unspecified: Secondary | ICD-10-CM | POA: Diagnosis not present

## 2023-11-30 DIAGNOSIS — E119 Type 2 diabetes mellitus without complications: Secondary | ICD-10-CM | POA: Diagnosis not present

## 2023-12-03 DIAGNOSIS — F03918 Unspecified dementia, unspecified severity, with other behavioral disturbance: Secondary | ICD-10-CM | POA: Diagnosis not present

## 2023-12-03 DIAGNOSIS — F411 Generalized anxiety disorder: Secondary | ICD-10-CM | POA: Diagnosis not present

## 2023-12-03 DIAGNOSIS — F331 Major depressive disorder, recurrent, moderate: Secondary | ICD-10-CM | POA: Diagnosis not present

## 2023-12-14 DIAGNOSIS — E785 Hyperlipidemia, unspecified: Secondary | ICD-10-CM | POA: Diagnosis not present

## 2023-12-14 DIAGNOSIS — I10 Essential (primary) hypertension: Secondary | ICD-10-CM | POA: Diagnosis not present

## 2023-12-14 DIAGNOSIS — F419 Anxiety disorder, unspecified: Secondary | ICD-10-CM | POA: Diagnosis not present

## 2023-12-14 DIAGNOSIS — F329 Major depressive disorder, single episode, unspecified: Secondary | ICD-10-CM | POA: Diagnosis not present

## 2023-12-28 DIAGNOSIS — E119 Type 2 diabetes mellitus without complications: Secondary | ICD-10-CM | POA: Diagnosis not present

## 2023-12-28 DIAGNOSIS — E785 Hyperlipidemia, unspecified: Secondary | ICD-10-CM | POA: Diagnosis not present

## 2023-12-31 DIAGNOSIS — F411 Generalized anxiety disorder: Secondary | ICD-10-CM | POA: Diagnosis not present

## 2023-12-31 DIAGNOSIS — F331 Major depressive disorder, recurrent, moderate: Secondary | ICD-10-CM | POA: Diagnosis not present

## 2023-12-31 DIAGNOSIS — F03918 Unspecified dementia, unspecified severity, with other behavioral disturbance: Secondary | ICD-10-CM | POA: Diagnosis not present

## 2024-01-28 DIAGNOSIS — F03918 Unspecified dementia, unspecified severity, with other behavioral disturbance: Secondary | ICD-10-CM | POA: Diagnosis not present

## 2024-01-28 DIAGNOSIS — F331 Major depressive disorder, recurrent, moderate: Secondary | ICD-10-CM | POA: Diagnosis not present

## 2024-01-28 DIAGNOSIS — F411 Generalized anxiety disorder: Secondary | ICD-10-CM | POA: Diagnosis not present

## 2024-02-01 DIAGNOSIS — R110 Nausea: Secondary | ICD-10-CM | POA: Diagnosis not present

## 2024-02-01 DIAGNOSIS — F419 Anxiety disorder, unspecified: Secondary | ICD-10-CM | POA: Diagnosis not present

## 2024-02-01 DIAGNOSIS — F0390 Unspecified dementia without behavioral disturbance: Secondary | ICD-10-CM | POA: Diagnosis not present

## 2024-02-01 DIAGNOSIS — I10 Essential (primary) hypertension: Secondary | ICD-10-CM | POA: Diagnosis not present

## 2024-02-01 DIAGNOSIS — E785 Hyperlipidemia, unspecified: Secondary | ICD-10-CM | POA: Diagnosis not present

## 2024-02-11 DIAGNOSIS — I10 Essential (primary) hypertension: Secondary | ICD-10-CM | POA: Diagnosis not present

## 2024-02-11 DIAGNOSIS — K219 Gastro-esophageal reflux disease without esophagitis: Secondary | ICD-10-CM | POA: Diagnosis not present

## 2024-02-11 DIAGNOSIS — E785 Hyperlipidemia, unspecified: Secondary | ICD-10-CM | POA: Diagnosis not present

## 2024-02-11 DIAGNOSIS — F0390 Unspecified dementia without behavioral disturbance: Secondary | ICD-10-CM | POA: Diagnosis not present

## 2024-02-11 DIAGNOSIS — F411 Generalized anxiety disorder: Secondary | ICD-10-CM | POA: Diagnosis not present

## 2024-02-11 DIAGNOSIS — F331 Major depressive disorder, recurrent, moderate: Secondary | ICD-10-CM | POA: Diagnosis not present

## 2024-02-13 ENCOUNTER — Encounter (HOSPITAL_COMMUNITY): Payer: Self-pay | Admitting: Emergency Medicine

## 2024-02-13 ENCOUNTER — Emergency Department (HOSPITAL_COMMUNITY)

## 2024-02-13 ENCOUNTER — Other Ambulatory Visit: Payer: Self-pay

## 2024-02-13 ENCOUNTER — Emergency Department (HOSPITAL_COMMUNITY)
Admission: EM | Admit: 2024-02-13 | Discharge: 2024-02-13 | Disposition: A | Discharge status: Home / Self Care | Source: Skilled Nursing Facility | Attending: Emergency Medicine | Admitting: Emergency Medicine

## 2024-02-13 DIAGNOSIS — R0789 Other chest pain: Secondary | ICD-10-CM | POA: Diagnosis not present

## 2024-02-13 DIAGNOSIS — S0990XA Unspecified injury of head, initial encounter: Secondary | ICD-10-CM | POA: Diagnosis not present

## 2024-02-13 DIAGNOSIS — I700 Atherosclerosis of aorta: Secondary | ICD-10-CM | POA: Diagnosis not present

## 2024-02-13 DIAGNOSIS — R519 Headache, unspecified: Secondary | ICD-10-CM | POA: Insufficient documentation

## 2024-02-13 DIAGNOSIS — K449 Diaphragmatic hernia without obstruction or gangrene: Secondary | ICD-10-CM | POA: Insufficient documentation

## 2024-02-13 DIAGNOSIS — E876 Hypokalemia: Secondary | ICD-10-CM | POA: Insufficient documentation

## 2024-02-13 DIAGNOSIS — K760 Fatty (change of) liver, not elsewhere classified: Secondary | ICD-10-CM | POA: Insufficient documentation

## 2024-02-13 DIAGNOSIS — S3991XA Unspecified injury of abdomen, initial encounter: Secondary | ICD-10-CM | POA: Diagnosis not present

## 2024-02-13 DIAGNOSIS — K5730 Diverticulosis of large intestine without perforation or abscess without bleeding: Secondary | ICD-10-CM | POA: Diagnosis not present

## 2024-02-13 DIAGNOSIS — I2510 Atherosclerotic heart disease of native coronary artery without angina pectoris: Secondary | ICD-10-CM | POA: Diagnosis not present

## 2024-02-13 DIAGNOSIS — R10A2 Flank pain, left side: Secondary | ICD-10-CM | POA: Insufficient documentation

## 2024-02-13 DIAGNOSIS — S299XXA Unspecified injury of thorax, initial encounter: Secondary | ICD-10-CM | POA: Diagnosis not present

## 2024-02-13 DIAGNOSIS — M48061 Spinal stenosis, lumbar region without neurogenic claudication: Secondary | ICD-10-CM | POA: Diagnosis not present

## 2024-02-13 DIAGNOSIS — N179 Acute kidney failure, unspecified: Secondary | ICD-10-CM | POA: Insufficient documentation

## 2024-02-13 DIAGNOSIS — E119 Type 2 diabetes mellitus without complications: Secondary | ICD-10-CM | POA: Insufficient documentation

## 2024-02-13 DIAGNOSIS — W01198A Fall on same level from slipping, tripping and stumbling with subsequent striking against other object, initial encounter: Secondary | ICD-10-CM | POA: Diagnosis not present

## 2024-02-13 DIAGNOSIS — S0003XA Contusion of scalp, initial encounter: Secondary | ICD-10-CM | POA: Insufficient documentation

## 2024-02-13 DIAGNOSIS — R531 Weakness: Secondary | ICD-10-CM | POA: Diagnosis present

## 2024-02-13 DIAGNOSIS — M8588 Other specified disorders of bone density and structure, other site: Secondary | ICD-10-CM | POA: Insufficient documentation

## 2024-02-13 DIAGNOSIS — W19XXXA Unspecified fall, initial encounter: Secondary | ICD-10-CM | POA: Insufficient documentation

## 2024-02-13 DIAGNOSIS — Z7982 Long term (current) use of aspirin: Secondary | ICD-10-CM | POA: Diagnosis not present

## 2024-02-13 DIAGNOSIS — F0390 Unspecified dementia without behavioral disturbance: Secondary | ICD-10-CM | POA: Insufficient documentation

## 2024-02-13 DIAGNOSIS — S199XXA Unspecified injury of neck, initial encounter: Secondary | ICD-10-CM | POA: Diagnosis not present

## 2024-02-13 LAB — BASIC METABOLIC PANEL WITH GFR
Anion gap: 14 (ref 5–15)
BUN: 29 mg/dL — ABNORMAL HIGH (ref 8–23)
CO2: 32 mmol/L (ref 22–32)
Calcium: 10.2 mg/dL (ref 8.9–10.3)
Chloride: 92 mmol/L — ABNORMAL LOW (ref 98–111)
Creatinine, Ser: 1.38 mg/dL — ABNORMAL HIGH (ref 0.44–1.00)
GFR, Estimated: 37 mL/min — ABNORMAL LOW (ref >60)
Glucose, Bld: 131 mg/dL — ABNORMAL HIGH (ref 70–99)
Potassium: 3.1 mmol/L — ABNORMAL LOW (ref 3.5–5.1)
Sodium: 137 mmol/L (ref 135–145)

## 2024-02-13 LAB — I-STAT CHEM 8, ED
BUN: 27 mg/dL — ABNORMAL HIGH (ref 8–23)
Calcium, Ion: 1.13 mmol/L — ABNORMAL LOW (ref 1.15–1.40)
Chloride: 95 mmol/L — ABNORMAL LOW (ref 98–111)
Creatinine, Ser: 1.2 mg/dL — ABNORMAL HIGH (ref 0.44–1.00)
Glucose, Bld: 127 mg/dL — ABNORMAL HIGH (ref 70–99)
HCT: 36 % (ref 36.0–46.0)
Hemoglobin: 12.2 g/dL (ref 12.0–15.0)
Potassium: 3.7 mmol/L (ref 3.5–5.1)
Sodium: 137 mmol/L (ref 135–145)
TCO2: 32 mmol/L (ref 22–32)

## 2024-02-13 LAB — CBC WITH DIFFERENTIAL/PLATELET
Abs Immature Granulocytes: 0.03 K/uL (ref 0.00–0.07)
Basophils Absolute: 0 K/uL (ref 0.0–0.1)
Basophils Relative: 0 %
Eosinophils Absolute: 0.1 K/uL (ref 0.0–0.5)
Eosinophils Relative: 1 %
HCT: 40.6 % (ref 36.0–46.0)
Hemoglobin: 13.5 g/dL (ref 12.0–15.0)
Immature Granulocytes: 0 %
Lymphocytes Relative: 15 %
Lymphs Abs: 1.2 K/uL (ref 0.7–4.0)
MCH: 32.3 pg (ref 26.0–34.0)
MCHC: 33.3 g/dL (ref 30.0–36.0)
MCV: 97.1 fL (ref 80.0–100.0)
Monocytes Absolute: 1 K/uL (ref 0.1–1.0)
Monocytes Relative: 12 %
Neutro Abs: 5.7 K/uL (ref 1.7–7.7)
Neutrophils Relative %: 72 %
Platelets: 275 K/uL (ref 150–400)
RBC: 4.18 MIL/uL (ref 3.87–5.11)
RDW: 12.1 % (ref 11.5–15.5)
WBC: 8.1 K/uL (ref 4.0–10.5)
nRBC: 0 % (ref 0.0–0.2)

## 2024-02-13 LAB — TROPONIN T, HIGH SENSITIVITY
Troponin T High Sensitivity: 26 ng/L — ABNORMAL HIGH (ref 0–19)
Troponin T High Sensitivity: 23 ng/L — ABNORMAL HIGH (ref 0–19)

## 2024-02-13 MED ORDER — IOHEXOL 300 MG/ML  SOLN
80.0000 mL | Freq: Once | INTRAMUSCULAR | Status: AC | PRN
  Administered 2024-02-13: 80 mL via INTRAVENOUS

## 2024-02-13 MED ORDER — POTASSIUM CHLORIDE 20 MEQ PO PACK
40.0000 meq | PACK | Freq: Once | ORAL | Status: AC
  Administered 2024-02-13: 40 meq via ORAL
  Filled 2024-02-13: qty 2

## 2024-02-13 MED ORDER — SODIUM CHLORIDE 0.9 % IV BOLUS
1000.0000 mL | Freq: Once | INTRAVENOUS | Status: AC
  Administered 2024-02-13: 1000 mL via INTRAVENOUS

## 2024-02-13 NOTE — ED Notes (Signed)
 Pt undressed and placed into gown.  2 gold colored chains removed and placed in specimen cup at bedside with clothes.  EDP at bedside.

## 2024-02-13 NOTE — ED Triage Notes (Signed)
 Pt BIB EMS from [facility] memory care.  Pt has hx of dementia.  Unwitnessed fall. No thinners  Pt does not know if she felt dizzy or tripped.  Pt is at baseline mentation per staff.  Pt has c collar in place. No LOC.  Does report pain to base of neck and has a hematoma to back of her head.

## 2024-02-13 NOTE — Discharge Instructions (Signed)
 You came in for a fall. Your lab work came back positive for an acute kidney injury and low potassium levels for which you received IV fluids. Your kidney function numbers improved and your potassium normalized. Your hematoma on the left side of the head will resolve on its own, it does not require any interventions. You were able to walk without any complications. You were stable to be discharged.  Please go to the nearest ED if your previous symptoms worsen.

## 2024-02-13 NOTE — ED Notes (Signed)
 Patient assisted with getting dressed in the room at this time and transported to the front to car in wheelchair to meet her nephew.IV discontinued and d/c paperwork reviewed and given to nephew.

## 2024-02-13 NOTE — ED Provider Notes (Addendum)
 Janet Chapman   CSN: [id number] Arrival date & time: 02/13/24  1712     Patient presents with: Janet Chapman is a 88 y.o. female.   Patient has a PMH of dementia, DM, HLD who was brought in by EMS due to an unwitnessed fall from [facility] memory care.  Patient does not remember the fall and does not remember anything before it or why she was brought in today. She does not know if she is on any blood thinners. Her chart doesn't say she is on any blood thinners. She complains about pain in the left chest region, left flank, parietal region of her head.  Patient denies any shortness of breath, abdominal pain.   Fall       Prior to Admission medications   Medication Sig Start Date End Date Taking? Authorizing Provider  ALPRAZolam  (XANAX ) 0.5 MG tablet Take 0.5 tablets (0.25 mg total) by mouth 2 (two) times daily. TAKE 1 TABLET BY MOUTH  daily AND additional 1-2 tabs AS NEEDED FOR ANXIETY OR JITTERINESS 10/25/23   Caleen Dirks, MD  aspirin  EC 81 MG tablet Take 81 mg by mouth daily with supper. Swallow whole.    [provider]  cyanocobalamin  (VITAMIN B12) 1000 MCG tablet Take 1,000 mcg by mouth daily.    [provider]  fluticasone  (FLONASE ) 50 MCG/ACT nasal spray Place 2 sprays into both nostrils daily. 03/15/22   Bedsole, Amy E, MD  ketoconazole  (NIZORAL ) 2 % shampoo Apply 1 application. topically every 14 (fourteen) days.    [provider]  losartan -hydrochlorothiazide  (HYZAAR) 100-25 MG tablet TAKE 1/2 TABLET BY MOUTH DAILY 10/26/22   Bedsole, Amy E, MD  memantine  (NAMENDA  XR) 14 MG CP24 24 hr capsule Take 1 capsule (14 mg total) by mouth daily. 07/11/22   Gayland Lauraine PARAS, NP  montelukast  (SINGULAIR ) 10 MG tablet TAKE 1 TABLET BY MOUTH AT BEDTIME 11/28/22   Bedsole, Amy E, MD  sertraline  (ZOLOFT ) 50 MG tablet Take 1 tablet (50 mg total) by mouth daily. 12/07/22   Bedsole, Amy E, MD  simvastatin   (ZOCOR ) 40 MG tablet TAKE 1 TABLET BY MOUTH EACH NIGHT AT BEDTIME 01/16/23   Bedsole, Amy E, MD    Allergies: Codeine    Review of Systems  Reason unable to perform ROS: Pertinent ROS in HPI, MDM.    Updated Vital Signs BP 123/62 (BP Location: Right Arm)   Pulse 72   Temp 98.3 F (36.8 C) (Oral)   Resp 16   SpO2 98%   Physical Exam Constitutional:      Comments: NAD  HENT:     Head: Normocephalic.     Comments: Tenderness in parietal region mostly on the left side. Examination of the occipital region limited due to c-collar placement.      Mouth/Throat:     Mouth: Mucous membranes are moist.     Comments: No dental trauma Eyes:     Pupils: Pupils are equal, round, and reactive to light.  Cardiovascular:     Rate and Rhythm: Normal rate.     Comments: Irregular rhythm +2 PT pulses BL Pulmonary:     Effort: Pulmonary effort is normal.     Breath sounds: Normal breath sounds.  Abdominal:     Palpations: Abdomen is soft.     Tenderness: There is no guarding or rebound.     Comments: Tenderness in the left upper quadrant close to the ribs  Musculoskeletal:        General: No swelling, tenderness, deformity or signs of injury.     Right lower leg: No edema.     Left lower leg: No edema.     Comments: Reproducible tenderness on palpation of left-sided chest. No crepitus or flail segments noted BL in the chest  No reproducible pain on palpation in the thoracic spine, lumbar spine, paraspinal tissues, left and right flank regions,  Skin:    Findings: No bruising, lesion or rash.  Neurological:     Mental Status: She is alert.     Comments: No weakness with flexion and extension of the bl knees and hips. No weakness with flexion and extension of bl elbows and shoulders     (all labs ordered are listed, but only abnormal results are displayed) Labs Reviewed  BASIC METABOLIC PANEL WITH GFR  CBC WITH DIFFERENTIAL/PLATELET  TROPONIN T, HIGH SENSITIVITY     EKG:   Radiology: No results found. FAST: No free fluid noted in the RUQ, pelvis, pericardium; maybe some free fluid on the LUQ but unsure   Procedures  None Medications Ordered in the ED - No data to display                                 Medical Decision Making Patient was brought in due to fall.  She has a history of dementia and does not remember the fall or events leading up to the fall.  She complained of pain in the parietal region of her head, left-sided chest pain, left flank pain.  Differential diagnosis includes rib fractures, abdominal bleed, hematoma in the head, c-spine fracture. Pt's imagining was negative for any acute abnormality or fractures.  Pt had a mild AKI and low K+ levels. Started her on IV fluids with potassium. Admission not required as she is stable. Pt's K+ levels normalized and her Cr levels were improving. She was able to walk without a fall.  Reevaluation: Pt was feeling well. She was accompanied by her nephew. Discussed her lab work and tx she received and mentioned that she was stable and would be discharged. Pt and nephew stated understanding.   Disposition: Stable and discharged   Amount and/or Complexity of Data Reviewed Labs: ordered.    Details: BMP, CBC, troponin Radiology: ordered.    Details: CT head, C-spine, chest, abdomen + pelvis  Risk Prescription drug management.       Final diagnoses:  None    ED Discharge Orders     None          Edgardo Pontiff, DO 02/13/24 2152    Edgardo Pontiff, DO 02/13/24 2326    Pamella Ozell LABOR, DO 02/21/24 1725

## 2024-02-21 DIAGNOSIS — E876 Hypokalemia: Secondary | ICD-10-CM | POA: Diagnosis not present

## 2024-02-21 DIAGNOSIS — F411 Generalized anxiety disorder: Secondary | ICD-10-CM | POA: Diagnosis not present

## 2024-02-21 DIAGNOSIS — F03918 Unspecified dementia, unspecified severity, with other behavioral disturbance: Secondary | ICD-10-CM | POA: Diagnosis not present

## 2024-02-25 DIAGNOSIS — F331 Major depressive disorder, recurrent, moderate: Secondary | ICD-10-CM | POA: Diagnosis not present

## 2024-02-25 DIAGNOSIS — F03918 Unspecified dementia, unspecified severity, with other behavioral disturbance: Secondary | ICD-10-CM | POA: Diagnosis not present

## 2024-02-28 ENCOUNTER — Other Ambulatory Visit: Payer: Self-pay

## 2024-02-28 ENCOUNTER — Emergency Department (HOSPITAL_COMMUNITY)

## 2024-02-28 ENCOUNTER — Emergency Department (HOSPITAL_COMMUNITY)
Admission: EM | Admit: 2024-02-28 | Discharge: 2024-02-28 | Disposition: A | Discharge status: Skilled Nursing Facility | Attending: Emergency Medicine | Admitting: Emergency Medicine

## 2024-02-28 DIAGNOSIS — R451 Restlessness and agitation: Secondary | ICD-10-CM | POA: Insufficient documentation

## 2024-02-28 DIAGNOSIS — R0982 Postnasal drip: Secondary | ICD-10-CM | POA: Insufficient documentation

## 2024-02-28 DIAGNOSIS — I10 Essential (primary) hypertension: Secondary | ICD-10-CM | POA: Insufficient documentation

## 2024-02-28 DIAGNOSIS — Z7982 Long term (current) use of aspirin: Secondary | ICD-10-CM | POA: Insufficient documentation

## 2024-02-28 DIAGNOSIS — Z79899 Other long term (current) drug therapy: Secondary | ICD-10-CM | POA: Diagnosis not present

## 2024-02-28 DIAGNOSIS — E119 Type 2 diabetes mellitus without complications: Secondary | ICD-10-CM | POA: Diagnosis not present

## 2024-02-28 DIAGNOSIS — R070 Pain in throat: Secondary | ICD-10-CM | POA: Diagnosis not present

## 2024-02-28 DIAGNOSIS — R0989 Other specified symptoms and signs involving the circulatory and respiratory systems: Secondary | ICD-10-CM | POA: Insufficient documentation

## 2024-02-28 DIAGNOSIS — R062 Wheezing: Secondary | ICD-10-CM | POA: Diagnosis not present

## 2024-02-28 LAB — CBC WITH DIFFERENTIAL/PLATELET
Abs Immature Granulocytes: 0.02 K/uL (ref 0.00–0.07)
Basophils Absolute: 0 K/uL (ref 0.0–0.1)
Basophils Relative: 0 %
Eosinophils Absolute: 0.1 K/uL (ref 0.0–0.5)
Eosinophils Relative: 2 %
HCT: 38.8 % (ref 36.0–46.0)
Hemoglobin: 12.9 g/dL (ref 12.0–15.0)
Immature Granulocytes: 0 %
Lymphocytes Relative: 23 %
Lymphs Abs: 1.7 K/uL (ref 0.7–4.0)
MCH: 32.3 pg (ref 26.0–34.0)
MCHC: 33.2 g/dL (ref 30.0–36.0)
MCV: 97 fL (ref 80.0–100.0)
Monocytes Absolute: 0.8 K/uL (ref 0.1–1.0)
Monocytes Relative: 10 %
Neutro Abs: 5 K/uL (ref 1.7–7.7)
Neutrophils Relative %: 65 %
Platelets: 309 K/uL (ref 150–400)
RBC: 4 MIL/uL (ref 3.87–5.11)
RDW: 12.4 % (ref 11.5–15.5)
WBC: 7.7 K/uL (ref 4.0–10.5)
nRBC: 0 % (ref 0.0–0.2)

## 2024-02-28 LAB — COMPREHENSIVE METABOLIC PANEL WITH GFR
ALT: 12 U/L (ref 0–44)
AST: 20 U/L (ref 15–41)
Albumin: 4.4 g/dL (ref 3.5–5.0)
Alkaline Phosphatase: 120 U/L (ref 38–126)
Anion gap: 12 (ref 5–15)
BUN: 12 mg/dL (ref 8–23)
CO2: 30 mmol/L (ref 22–32)
Calcium: 10.1 mg/dL (ref 8.9–10.3)
Chloride: 96 mmol/L — ABNORMAL LOW (ref 98–111)
Creatinine, Ser: 0.78 mg/dL (ref 0.44–1.00)
GFR, Estimated: >60 mL/min (ref >60)
Glucose, Bld: 116 mg/dL — ABNORMAL HIGH (ref 70–99)
Potassium: 3.3 mmol/L — ABNORMAL LOW (ref 3.5–5.1)
Sodium: 138 mmol/L (ref 135–145)
Total Bilirubin: 0.5 mg/dL (ref 0.0–1.2)
Total Protein: 7.2 g/dL (ref 6.5–8.1)

## 2024-02-28 LAB — URINALYSIS, W/ REFLEX TO CULTURE (INFECTION SUSPECTED)
Bilirubin Urine: NEGATIVE
Glucose, UA: NEGATIVE mg/dL
Hgb urine dipstick: NEGATIVE
Ketones, ur: NEGATIVE mg/dL
Leukocytes,Ua: NEGATIVE
Nitrite: NEGATIVE
Protein, ur: NEGATIVE mg/dL
Specific Gravity, Urine: 1.017 (ref 1.005–1.030)
pH: 6 (ref 5.0–8.0)

## 2024-02-28 MED ORDER — CETIRIZINE HCL 5 MG PO TABS: 5.0000 mg | ORAL_TABLET | Freq: Every day | ORAL | 0 refills | Status: DC

## 2024-02-28 MED ORDER — CETIRIZINE HCL 5 MG PO TABS: 5.0000 mg | ORAL_TABLET | Freq: Every day | ORAL | 0 refills | Status: AC

## 2024-02-28 MED ORDER — AZELASTINE HCL 0.1 % NA SOLN: 1.0000 | Freq: Two times a day (BID) | NASAL | 12 refills | Status: AC

## 2024-02-28 MED ORDER — AZELASTINE HCL 0.1 % NA SOLN: 1.0000 | Freq: Two times a day (BID) | NASAL | 12 refills | Status: DC

## 2024-02-28 NOTE — ED Provider Notes (Addendum)
 [place] EMERGENCY DEPARTMENT AT [facility] Provider Note   CSN: [id number] Arrival date & time: 02/28/24  1212     History  Chief Complaint  Patient presents with   Agitation   Dysphagia    Janet Chapman is a 88 y.o. female with PMH as listed below who presents with throat clearing. Friend at bedside states that for the last couple of days patient has been complaining of the sensation of needing to clear her throat. Feels like there is drainage or fluid in the back of her throat. She states this has happened to her before. Denies globus sensation or swelling. Per EMS, patient with dementia, oriented but repetitive questioning. Having behavioral issues described and punching the walls, periods of mild agitation. Friend at bedside Heron states she has been agitated with her throat clearing. She denies any shortness of breath, cough, productive cough, hemoptysis, throat or mouth swelling, difficulty swallowing, difficulty breathing. Denies any dysphagia. Denies f/c, viral symptoms, runny nose, sore throat. Denies nausea/vomiting/abdominal pain, urinary sxs.   Per chart review, complained of sxs of PND including nasal congestion, throat clearing, and sneezing in 2020 at allergy [facility] of [place]. Diagnosed at that time with allergic rhinitis and had positive skin testing to molds, dog, and dust mites. Recommended azelastine  spray 1-2 sprays per nostril up to twice per day for sinus drainage. Also recommended fluticasone  spray and nasal saline spray as well as possibly OTC antihistamines like zyrtec. Also recommended continuing montelukast .    Past Medical History:  Diagnosis Date   Allergic rhinitis    Diabetes mellitus without complication (HCC)    diet controlled   GERD (gastroesophageal reflux disease)    Hyperlipidemia    Hypertension    Memory loss    Osteoarthritis    Urinary incontinence        Home Medications Prior to Admission medications   Medication  Sig Start Date End Date Taking? Authorizing Provider  azelastine  (ASTELIN ) 0.1 % nasal spray Place 1 spray into both nostrils 2 (two) times daily. Use in each nostril as directed 02/28/24  Yes Franklyn Sid SAILOR, MD  cetirizine (ZYRTEC) 5 MG tablet Take 1 tablet (5 mg total) by mouth daily. 02/28/24 03/29/24 Yes Franklyn Sid SAILOR, MD  ALPRAZolam  (XANAX ) 0.5 MG tablet Take 0.5 tablets (0.25 mg total) by mouth 2 (two) times daily. TAKE 1 TABLET BY MOUTH  daily AND additional 1-2 tabs AS NEEDED FOR ANXIETY OR JITTERINESS 10/25/23   Caleen Dirks, MD  aspirin  EC 81 MG tablet Take 81 mg by mouth daily with supper. Swallow whole.    [provider]  cyanocobalamin  (VITAMIN B12) 1000 MCG tablet Take 1,000 mcg by mouth daily.    [provider]  fluticasone  (FLONASE ) 50 MCG/ACT nasal spray Place 2 sprays into both nostrils daily. 03/15/22   Bedsole, Amy E, MD  ketoconazole  (NIZORAL ) 2 % shampoo Apply 1 application. topically every 14 (fourteen) days.    [provider]  losartan -hydrochlorothiazide  (HYZAAR) 100-25 MG tablet TAKE 1/2 TABLET BY MOUTH DAILY 10/26/22   Bedsole, Amy E, MD  memantine  (NAMENDA  XR) 14 MG CP24 24 hr capsule Take 1 capsule (14 mg total) by mouth daily. 07/11/22   Gayland Lauraine PARAS, NP  montelukast  (SINGULAIR ) 10 MG tablet TAKE 1 TABLET BY MOUTH AT BEDTIME 11/28/22   Bedsole, Amy E, MD  sertraline  (ZOLOFT ) 50 MG tablet Take 1 tablet (50 mg total) by mouth daily. 12/07/22   Avelina Greig BRAVO, MD  simvastatin  (ZOCOR ) 40  MG tablet TAKE 1 TABLET BY MOUTH EACH NIGHT AT BEDTIME 01/16/23   Bedsole, Amy E, MD      Allergies    Codeine    Review of Systems   Review of Systems A 10 point review of systems was performed and is negative unless otherwise reported in HPI.  Physical Exam Updated Vital Signs BP (!) 111/56 (BP Location: Right Arm)   Pulse 74   Temp 98.4 F (36.9 C) (Oral)   Resp 18   SpO2 98%  Physical Exam General: Normal appearing elderly female, sitting on the  bed.  HEENT: PERRLA, Sclera anicteric, MMM, trachea midline.  Normal-appearing nasal mucosa.  Normal-appearing oropharynx without any oropharyngeal swelling or erythema.  Symmetric tonsillar pillars with no tonsillar erythema or edema, no tonsillar exudates.  Normal-appearing uvula.  No fullness to the posterior oropharynx, no erythema.  No anterior cervical lymphadenopathy, no neck mass or fullness palpated.  No tenderness to the submandibular region. Cardiology: RRR, no murmurs/rubs/gallops.  Resp: Normal respiratory rate and effort. CTAB, no wheezes, rhonchi, crackles.  Abd: Soft, non-tender, non-distended. No rebound tenderness or guarding.  GU: Deferred. MSK: No peripheral edema or signs of trauma. Extremities without deformity or TTP. No cyanosis or clubbing. Skin: warm, dry.  Neuro: A&Ox1-2 (baseline per the friend at bedside), CNs II-XII grossly intact. MAEs. Sensation grossly intact.  Psych: Pleasant affect  ED Results / Procedures / Treatments   Labs (all labs ordered are listed, but only abnormal results are displayed) Labs Reviewed  COMPREHENSIVE METABOLIC PANEL WITH GFR - Abnormal; Notable for the following components:      Result Value   Potassium 3.3 (*)    Chloride 96 (*)    Glucose, Bld 116 (*)    All other components within normal limits  URINALYSIS, W/ REFLEX TO CULTURE (INFECTION SUSPECTED) - Abnormal; Notable for the following components:   Color, Urine AMBER (*)    APPearance HAZY (*)    Bacteria, UA RARE (*)    All other components within normal limits  CBC WITH DIFFERENTIAL/PLATELET    EKG EKG Interpretation Date/Time:  Thursday February 28 2024 16:01:54 EDT Ventricular Rate:  70 PR Interval:  156 QRS Duration:  88 QT Interval:  418 QTC Calculation: 451 R Axis:   13  Text Interpretation: Sinus rhythm with marked sinus arrhythmia Possible Anterior infarct , age undetermined Abnormal ECG Sinus arrhythmia similar to prior Confirmed by Franklyn Gills [phone])  on 02/28/2024 4:12:54 PM  Radiology DG Chest 2 View Result Date: 02/28/2024 EXAM: 2 VIEW(S) XRAY OF THE CHEST 02/28/2024 01:12:00 PM COMPARISON: 09/04/2018 CLINICAL HISTORY: wheezing. FINDINGS: LUNGS AND PLEURA: No focal pulmonary opacity. No pulmonary edema. No pleural effusion. No pneumothorax. HEART AND MEDIASTINUM: No acute abnormality of the cardiac and mediastinal silhouettes. BONES AND SOFT TISSUES: No acute osseous abnormality. IMPRESSION: 1. No acute cardiopulmonary abnormality. Electronically signed by: Lynwood Seip MD 02/28/2024 01:54 PM EDT RP Workstation: [id number]    Procedures Procedures    Medications Ordered in ED Medications - No data to display  ED Course/ Medical Decision Making/ A&P                          Medical Decision Making Risk OTC drugs. Prescription drug management.    This patient presents to the ED for concern of throat clearing, this involves an extensive number of treatment options, and is a complaint that carries with it a high risk of complications and morbidity.  I considered  the following differential and admission for this acute, potentially life threatening condition.  Patient is overall very well-appearing and hemodynamically stable.  MDM:    Patient with throat clearing and sensation of fluid in the back of her throat.  No globus sensation or dysphagia.  No abnormalities on physical exam that would indicate peritonsillar abscess, LPA, neck mass, or Ludwig's angina.  Patient is extremely well-appearing, hemodynamically stable, labs and chest x-ray ordered from triage are reassuring.  She has clear lung sounds bilaterally as well and no cough that would indicate obstructive lung disease or pneumonia.  No other symptoms of a viral illness.  No difficulty breathing or stridor that would indicate an upper airway issue.  Per chart review patient has been treated in the past for postnasal drip symptoms and allergic rhinitis.  She has not any nasal  congestion now but does seem to have symptoms of postnasal drip like the throat clearing.  She had been prescribed azelastine  spray at that time but does not currently have a prescription for it.  Will prescribe azelastine  spray as well as Zyrtec 5 mg p.o. given patient's age as opposed to 10 mg p.o.   As far as her reported agitation, patient is currently acting at her baseline and is very pleasant per her friend at bedside.  Her friend states that she was just getting agitated because of the frustration she was experiencing from her throat clearing symptoms.  She has no UTI, no headache or neurocomplaints at this time.  Patient is currently acting at her baseline.   Advise follow-up with her PCP within week to ensure improvement in her symptoms as well.  Discussed with patient and her friend at bedside.  Clinical Course as of 02/28/24 1743  Thu Feb 28, 2024  1633 Labs demonstrate CBC within normal limits, CMP unremarkable, UA with no UTI, chest x-ray with no acute findings, EKG with no acute changes [HN]    Clinical Course User Index [HN] Franklyn Sid SAILOR, MD    Labs: I Ordered, and personally interpreted labs.  The pertinent results include: Those listed above  Imaging Studies ordered: Chest x-ray ordered from triage I independently visualized and interpreted imaging. I agree with the radiologist interpretation  Additional history obtained from chart review.     Social Determinants of Health:  lives at facility  Disposition:  DC w/ discharge instructions/return precautions. All questions answered to patient's satisfaction.    Co morbidities that complicate the patient evaluation  Past Medical History:  Diagnosis Date   Allergic rhinitis    Diabetes mellitus without complication (HCC)    diet controlled   GERD (gastroesophageal reflux disease)    Hyperlipidemia    Hypertension    Memory loss    Osteoarthritis    Urinary incontinence      Medicines Meds ordered this  encounter  Medications   cetirizine (ZYRTEC) 5 MG tablet    Sig: Take 1 tablet (5 mg total) by mouth daily.    Dispense:  30 tablet    Refill:  0   azelastine  (ASTELIN ) 0.1 % nasal spray    Sig: Place 1 spray into both nostrils 2 (two) times daily. Use in each nostril as directed    Dispense:  30 mL    Refill:  12    I have reviewed the patients home medicines and have made adjustments as needed  Problem List / ED Course: Problem List Items Addressed This Visit   None Visit Diagnoses  Throat clearing    -  Primary     Post-nasal drip              Franklyn Sid SAILOR, MD 02/28/24 1744

## 2024-02-28 NOTE — Discharge Instructions (Addendum)
 Thank you for coming to [facility] Emergency Department. You were seen for throat clearing.  It is likely that you have postnasal drip.  You had been previously prescribed azelastine  spray by an allergist.  Please use 1 to 2 sprays in both nostrils up to twice per day for the symptoms.  Please also take Zyrtec 5 mg once per day. Please follow up with your primary care provider within 1-2 weeks to ensure that your symptoms have improved.   Do not hesitate to return to the ED or call 911 if you experience: -Worsening symptoms -Difficulty breathing -Difficulty swallowing -Cough, coughing up blood -Severe sore throat, neck swelling -Lightheadedness, passing out -Fevers/chills -Anything else that concerns you

## 2024-02-28 NOTE — ED Triage Notes (Signed)
 PT arrives arrives EMS from [place] assisted living. PT reports a sensation of liquid in her throat, and having to clear her throat often. PT has dementia. Staff report that the pt has had recent behavioral disturbances, and was punching the wall on EMS arrival. Recent UA was clear at facility.

## 2024-02-28 NOTE — ED Provider Triage Note (Signed)
 Emergency Medicine Provider Triage Evaluation Note  Janet Chapman , a 88 y.o. female  was evaluated in triage.  Pt complains of SF patient.  Review of Systems  Positive:  Negative:   Physical Exam  There were no vitals taken for this visit. Gen:   Awake, no distress   Resp:  Normal effort  MSK:   Moves extremities without difficulty  Other:    Medical Decision Making  Medically screening exam initiated at 12:50 PM.  Appropriate orders placed.  Janet Chapman was informed that the remainder of the evaluation will be completed by another provider, this initial triage assessment does not replace that evaluation, and the importance of remaining in the ED until their evaluation is complete.  Per EMS, patient with dementia, oriented but repetitive questioning. Having behavioral issues described and punching the walls, periods of mild agitation. Recently has been acting like it is difficult to swallow, clearing her throat frequently.   Has no cough reported but has mild wheezing. No reported fever.    Odell Balls, PA-C 02/28/24 1252

## 2024-03-07 DIAGNOSIS — I10 Essential (primary) hypertension: Secondary | ICD-10-CM | POA: Diagnosis not present

## 2024-03-07 DIAGNOSIS — J45909 Unspecified asthma, uncomplicated: Secondary | ICD-10-CM | POA: Diagnosis not present

## 2024-03-10 DIAGNOSIS — F331 Major depressive disorder, recurrent, moderate: Secondary | ICD-10-CM | POA: Diagnosis not present

## 2024-03-10 DIAGNOSIS — F03918 Unspecified dementia, unspecified severity, with other behavioral disturbance: Secondary | ICD-10-CM | POA: Diagnosis not present

## 2024-03-10 DIAGNOSIS — F411 Generalized anxiety disorder: Secondary | ICD-10-CM | POA: Diagnosis not present

## 2024-03-14 DIAGNOSIS — F339 Major depressive disorder, recurrent, unspecified: Secondary | ICD-10-CM | POA: Diagnosis not present

## 2024-03-14 DIAGNOSIS — F411 Generalized anxiety disorder: Secondary | ICD-10-CM | POA: Diagnosis not present

## 2024-03-14 DIAGNOSIS — E785 Hyperlipidemia, unspecified: Secondary | ICD-10-CM | POA: Diagnosis not present

## 2024-03-14 DIAGNOSIS — K219 Gastro-esophageal reflux disease without esophagitis: Secondary | ICD-10-CM | POA: Diagnosis not present

## 2024-03-17 DIAGNOSIS — F419 Anxiety disorder, unspecified: Secondary | ICD-10-CM | POA: Diagnosis not present

## 2024-03-17 DIAGNOSIS — R413 Other amnesia: Secondary | ICD-10-CM | POA: Diagnosis not present

## 2024-03-17 DIAGNOSIS — I10 Essential (primary) hypertension: Secondary | ICD-10-CM | POA: Diagnosis not present

## 2024-03-24 DIAGNOSIS — F03918 Unspecified dementia, unspecified severity, with other behavioral disturbance: Secondary | ICD-10-CM | POA: Diagnosis not present

## 2024-03-28 DIAGNOSIS — E876 Hypokalemia: Secondary | ICD-10-CM | POA: Diagnosis not present

## 2024-03-30 ENCOUNTER — Other Ambulatory Visit: Payer: Self-pay

## 2024-03-30 ENCOUNTER — Encounter (HOSPITAL_COMMUNITY): Payer: Self-pay | Admitting: Emergency Medicine

## 2024-03-30 ENCOUNTER — Emergency Department (HOSPITAL_COMMUNITY)

## 2024-03-30 ENCOUNTER — Emergency Department (HOSPITAL_COMMUNITY)
Admission: EM | Admit: 2024-03-30 | Discharge: 2024-03-30 | Disposition: A | Discharge status: Skilled Nursing Facility | Attending: Emergency Medicine | Admitting: Emergency Medicine

## 2024-03-30 DIAGNOSIS — I6782 Cerebral ischemia: Secondary | ICD-10-CM | POA: Diagnosis not present

## 2024-03-30 DIAGNOSIS — M4312 Spondylolisthesis, cervical region: Secondary | ICD-10-CM | POA: Diagnosis not present

## 2024-03-30 DIAGNOSIS — Z7982 Long term (current) use of aspirin: Secondary | ICD-10-CM | POA: Insufficient documentation

## 2024-03-30 DIAGNOSIS — Y92129 Unspecified place in nursing home as the place of occurrence of the external cause: Secondary | ICD-10-CM | POA: Diagnosis not present

## 2024-03-30 DIAGNOSIS — W01198A Fall on same level from slipping, tripping and stumbling with subsequent striking against other object, initial encounter: Secondary | ICD-10-CM | POA: Insufficient documentation

## 2024-03-30 DIAGNOSIS — Z79899 Other long term (current) drug therapy: Secondary | ICD-10-CM | POA: Diagnosis not present

## 2024-03-30 DIAGNOSIS — S0990XA Unspecified injury of head, initial encounter: Secondary | ICD-10-CM | POA: Diagnosis not present

## 2024-03-30 DIAGNOSIS — F0390 Unspecified dementia without behavioral disturbance: Secondary | ICD-10-CM | POA: Insufficient documentation

## 2024-03-30 DIAGNOSIS — M4802 Spinal stenosis, cervical region: Secondary | ICD-10-CM | POA: Diagnosis not present

## 2024-03-30 DIAGNOSIS — W19XXXA Unspecified fall, initial encounter: Secondary | ICD-10-CM | POA: Diagnosis not present

## 2024-03-30 DIAGNOSIS — M47812 Spondylosis without myelopathy or radiculopathy, cervical region: Secondary | ICD-10-CM | POA: Diagnosis not present

## 2024-03-30 NOTE — ED Provider Notes (Signed)
 [place] EMERGENCY DEPARTMENT AT [facility] Provider Note   CSN: [id number] Arrival date & time: 03/30/24  1540     Patient presents with: Janet Chapman Janet Chapman is a 88 y.o. female.   Patient is an 88 year old female with a past medical history of dementia presenting to the emergency department after a fall.  Per EMS, the patient had a witnessed fall at her memory care nursing home when she was attempting to elope.  She fell back and hit the back of her head, had no loss of consciousness.  Patient is not on any blood thinners.  The patient does not remember the fall but denies any pain at this time, nausea, vomiting, numbness or weakness.  The history is provided by the patient.  Fall       Prior to Admission medications   Medication Sig Start Date End Date Taking? Authorizing Provider  ALPRAZolam  (XANAX ) 0.5 MG tablet Take 0.5 tablets (0.25 mg total) by mouth 2 (two) times daily. TAKE 1 TABLET BY MOUTH  daily AND additional 1-2 tabs AS NEEDED FOR ANXIETY OR JITTERINESS 10/25/23   Caleen Dirks, MD  aspirin  EC 81 MG tablet Take 81 mg by mouth daily with supper. Swallow whole.    [provider]  azelastine  (ASTELIN ) 0.1 % nasal spray Place 1 spray into both nostrils 2 (two) times daily. Use in each nostril as directed 02/28/24   Franklyn Sid SAILOR, MD  cetirizine (ZYRTEC) 5 MG tablet Take 1 tablet (5 mg total) by mouth daily. 02/28/24 03/29/24  Franklyn Sid SAILOR, MD  cyanocobalamin  (VITAMIN B12) 1000 MCG tablet Take 1,000 mcg by mouth daily.    [provider]  fluticasone  (FLONASE ) 50 MCG/ACT nasal spray Place 2 sprays into both nostrils daily. 03/15/22   Bedsole, Amy E, MD  ketoconazole  (NIZORAL ) 2 % shampoo Apply 1 application. topically every 14 (fourteen) days.    [provider]  losartan -hydrochlorothiazide  (HYZAAR) 100-25 MG tablet TAKE 1/2 TABLET BY MOUTH DAILY 10/26/22   Bedsole, Amy E, MD  memantine  (NAMENDA  XR) 14 MG CP24 24 hr capsule Take  1 capsule (14 mg total) by mouth daily. 07/11/22   Gayland Lauraine PARAS, NP  montelukast  (SINGULAIR ) 10 MG tablet TAKE 1 TABLET BY MOUTH AT BEDTIME 11/28/22   Bedsole, Amy E, MD  sertraline  (ZOLOFT ) 50 MG tablet Take 1 tablet (50 mg total) by mouth daily. 12/07/22   Bedsole, Amy E, MD  simvastatin  (ZOCOR ) 40 MG tablet TAKE 1 TABLET BY MOUTH EACH NIGHT AT BEDTIME 01/16/23   Bedsole, Amy E, MD    Allergies: Codeine    Review of Systems  Updated Vital Signs BP 123/61 (BP Location: Right Arm)   Pulse 75   Temp 98.2 F (36.8 C) (Oral)   Resp 18   Ht 5' 3 (1.6 m)   Wt 74.4 kg   SpO2 98%   BMI 29.05 kg/m   Physical Exam Vitals and nursing note reviewed.  Constitutional:      General: She is not in acute distress.    Appearance: Normal appearance.  HENT:     Head: Normocephalic and atraumatic.     Nose: Nose normal.     Mouth/Throat:     Mouth: Mucous membranes are moist.     Pharynx: Oropharynx is clear.  Eyes:     Extraocular Movements: Extraocular movements intact.     Conjunctiva/sclera: Conjunctivae normal.  Neck:     Comments: No midline neck tenderness Cardiovascular:  Rate and Rhythm: Normal rate and regular rhythm.     Heart sounds: Normal heart sounds.  Pulmonary:     Effort: Pulmonary effort is normal.     Breath sounds: Normal breath sounds.  Abdominal:     General: Abdomen is flat.     Palpations: Abdomen is soft.     Tenderness: There is no abdominal tenderness.  Musculoskeletal:        General: Normal range of motion.     Cervical back: Normal range of motion and neck supple.     Comments: No midline back tenderness No bony tenderness to bilateral UE and LE Pelvis stable, non-tender  Skin:    General: Skin is warm and dry.  Neurological:     Mental Status: She is alert. Mental status is at baseline.     Cranial Nerves: No cranial nerve deficit.     Sensory: No sensory deficit.     Motor: No weakness.  Psychiatric:        Mood and Affect: Mood normal.         Behavior: Behavior normal.     (all labs ordered are listed, but only abnormal results are displayed) Labs Reviewed - No data to display  EKG: None  Radiology: CT Cervical Spine Wo Contrast Result Date: 03/30/2024 CLINICAL DATA:  Neck trauma.  Witnessed fall. EXAM: CT CERVICAL SPINE WITHOUT CONTRAST TECHNIQUE: Multidetector CT imaging of the cervical spine was performed without intravenous contrast. Multiplanar CT image reconstructions were also generated. RADIATION DOSE REDUCTION: This exam was performed according to the departmental dose-optimization program which includes automated exposure control, adjustment of the mA and/or kV according to patient size and/or use of iterative reconstruction technique. COMPARISON:  CT cervical spine 02/13/2024. FINDINGS: Alignment: Stable straightening and minimal degenerative anterolisthesis at C4-5. Skull base and vertebrae: No evidence of acute fracture or traumatic subluxation. Soft tissues and spinal canal: No prevertebral fluid or swelling. No visible canal hematoma. Disc levels: Stable multilevel spondylosis with disc space narrowing and uncinate spurring greatest at C5-6 and C6-7. Asymmetric facet hypertrophy on the right at C3-4. No evidence of large disc herniation or high-grade spinal stenosis. Scattered mild foraminal narrowing. Upper chest: Stable scarring at both lung apices. Other: Bilateral carotid atherosclerosis. IMPRESSION: 1. No evidence of acute cervical spine fracture, traumatic subluxation or static signs of instability. 2. Stable multilevel cervical spondylosis. Electronically Signed   By: Elsie Perone M.D.   On: 03/30/2024 17:32   CT Head Wo Contrast Result Date: 03/30/2024 EXAM: CT HEAD WITHOUT CONTRAST 03/30/2024 05:23:22 PM TECHNIQUE: CT of the head was performed without the administration of intravenous contrast. Automated exposure control, iterative reconstruction, and/or weight based adjustment of the mA/kV was utilized to  reduce the radiation dose to as low as reasonably achievable. COMPARISON: 02/13/2024 CLINICAL HISTORY: Head trauma, minor (Age >= 65y) FINDINGS: BRAIN AND VENTRICLES: Periventricular white matter small vessel ischemic changes age-related involutional changes. No acute hemorrhage. No evidence of acute infarct. No hydrocephalus. No extra-axial collection. No mass effect or midline shift. ORBITS: No acute abnormality. SINUSES: No acute abnormality. SOFT TISSUES AND SKULL: No acute soft tissue abnormality. No skull fracture. IMPRESSION: 1. No acute intracranial abnormality related to minor head trauma. 2. Periventricular white matter small vessel ischemic changes and age-related involutional changes. Electronically signed by: Fonda Field MD 03/30/2024 05:32 PM EST RP Workstation: [id number]     Procedures   Medications Ordered in the ED - No data to display  Clinical Course as of 03/30/24 1751  Sun Mar 30, 2024  1735 No acute traumatic injury. [VK]    Clinical Course User Index [VK] Kingsley, Gatsby Chismar K, DO                                 Medical Decision Making This patient presents to the ED with chief complaint(s) of fall with pertinent past medical history of dementia which further complicates the presenting complaint. The complaint involves an extensive differential diagnosis and also carries with it a high risk of complications and morbidity.    The differential diagnosis includes due to patient's age and history of head injury, concern for ICH or mass effect, no other traumatic injury seen on exam, no presyncopal symptoms making syncopal fall unlikely  Additional history obtained: Additional history obtained from EMS  Records reviewed Nursing Home Documents  ED Course and Reassessment: On patient's arrival she is hemodynamically stable in no acute distress.  Due to patient's fall with head injury will have head CT and C-spine performed.  No other traumatic injury seen on exam.  She  will be closely reassessed.  Independent labs interpretation:  N/A  Independent visualization of imaging: - I independently visualized the following imaging with scope of interpretation limited to determining acute life threatening conditions related to emergency care: CTH/C-spine, which revealed no acute traumatic injury  Consultation: - Consulted or discussed management/test interpretation w/ external professional: N/A  Consideration for admission or further workup: Patient has no emergent conditions requiring admission or further work-up at this time and is stable for discharge home with primary care follow-up  Social Determinants of health: N/A    Amount and/or Complexity of Data Reviewed Radiology: ordered.       Final diagnoses:  Fall, initial encounter    ED Discharge Orders     None          Ellouise Richerd POUR, DO 03/30/24 1751

## 2024-03-30 NOTE — ED Triage Notes (Signed)
 Pt bib EMS from [facility] memory care. Pt had witnessed fall during elopement attempt. Denies LOC did hit back of head while trying to stand after initial fall. Pt denies pain.  110/62 75P 97RA 118 CBG

## 2024-03-30 NOTE — Discharge Instructions (Signed)
 You were seen in the emergency department after your fall.  Your imaging showed no broken bones or bleeding in your brain.  You can take Tylenol  as needed for headaches and can follow-up with your primary doctor as needed to have your symptoms rechecked.  You should return to the emergency department for significantly worsening headache, repetitive vomiting, if you have seizures, you are drowsy and difficult to wake up or if you have any other new or concerning symptoms.

## 2024-04-07 DIAGNOSIS — F419 Anxiety disorder, unspecified: Secondary | ICD-10-CM | POA: Diagnosis not present

## 2024-04-07 DIAGNOSIS — F03918 Unspecified dementia, unspecified severity, with other behavioral disturbance: Secondary | ICD-10-CM | POA: Diagnosis not present

## 2024-04-07 DIAGNOSIS — E785 Hyperlipidemia, unspecified: Secondary | ICD-10-CM | POA: Diagnosis not present

## 2024-04-07 DIAGNOSIS — K219 Gastro-esophageal reflux disease without esophagitis: Secondary | ICD-10-CM | POA: Diagnosis not present

## 2024-04-07 DIAGNOSIS — I10 Essential (primary) hypertension: Secondary | ICD-10-CM | POA: Diagnosis not present

## 2024-04-07 DIAGNOSIS — F411 Generalized anxiety disorder: Secondary | ICD-10-CM | POA: Diagnosis not present
